# Patient Record
Sex: Female | Born: 1960 | Race: Black or African American | Hispanic: No | Smoking: Current some day smoker
Health system: Southern US, Community
[De-identification: ages and names within clinical notes are randomized; demographics above are authoritative.]

## PROBLEM LIST (undated history)

## (undated) ENCOUNTER — Ambulatory Visit: Admission: EM | Payer: Medicaid Other | Source: Home / Self Care

## (undated) DIAGNOSIS — R7303 Prediabetes: Secondary | ICD-10-CM

## (undated) DIAGNOSIS — F32A Depression, unspecified: Secondary | ICD-10-CM

## (undated) DIAGNOSIS — Z972 Presence of dental prosthetic device (complete) (partial): Secondary | ICD-10-CM

## (undated) DIAGNOSIS — Z8719 Personal history of other diseases of the digestive system: Secondary | ICD-10-CM

## (undated) DIAGNOSIS — F419 Anxiety disorder, unspecified: Secondary | ICD-10-CM

## (undated) DIAGNOSIS — K449 Diaphragmatic hernia without obstruction or gangrene: Secondary | ICD-10-CM

## (undated) DIAGNOSIS — N189 Chronic kidney disease, unspecified: Secondary | ICD-10-CM

## (undated) DIAGNOSIS — I10 Essential (primary) hypertension: Secondary | ICD-10-CM

## (undated) DIAGNOSIS — M2550 Pain in unspecified joint: Secondary | ICD-10-CM

## (undated) DIAGNOSIS — A159 Respiratory tuberculosis unspecified: Secondary | ICD-10-CM

## (undated) DIAGNOSIS — G4733 Obstructive sleep apnea (adult) (pediatric): Secondary | ICD-10-CM

## (undated) DIAGNOSIS — D849 Immunodeficiency, unspecified: Secondary | ICD-10-CM

## (undated) DIAGNOSIS — R0600 Dyspnea, unspecified: Secondary | ICD-10-CM

## (undated) DIAGNOSIS — G4730 Sleep apnea, unspecified: Secondary | ICD-10-CM

## (undated) DIAGNOSIS — R0602 Shortness of breath: Secondary | ICD-10-CM

## (undated) DIAGNOSIS — M1990 Unspecified osteoarthritis, unspecified site: Secondary | ICD-10-CM

## (undated) DIAGNOSIS — E739 Lactose intolerance, unspecified: Secondary | ICD-10-CM

## (undated) DIAGNOSIS — N2581 Secondary hyperparathyroidism of renal origin: Secondary | ICD-10-CM

## (undated) DIAGNOSIS — E7800 Pure hypercholesterolemia, unspecified: Secondary | ICD-10-CM

## (undated) DIAGNOSIS — Q613 Polycystic kidney, unspecified: Secondary | ICD-10-CM

## (undated) DIAGNOSIS — N12 Tubulo-interstitial nephritis, not specified as acute or chronic: Secondary | ICD-10-CM

## (undated) DIAGNOSIS — R12 Heartburn: Secondary | ICD-10-CM

## (undated) DIAGNOSIS — J189 Pneumonia, unspecified organism: Secondary | ICD-10-CM

## (undated) DIAGNOSIS — K420 Umbilical hernia with obstruction, without gangrene: Secondary | ICD-10-CM

## (undated) DIAGNOSIS — D631 Anemia in chronic kidney disease: Secondary | ICD-10-CM

## (undated) DIAGNOSIS — Z9889 Other specified postprocedural states: Secondary | ICD-10-CM

## (undated) DIAGNOSIS — E282 Polycystic ovarian syndrome: Secondary | ICD-10-CM

## (undated) DIAGNOSIS — N8110 Cystocele, unspecified: Secondary | ICD-10-CM

## (undated) DIAGNOSIS — E119 Type 2 diabetes mellitus without complications: Secondary | ICD-10-CM

## (undated) DIAGNOSIS — Z973 Presence of spectacles and contact lenses: Secondary | ICD-10-CM

## (undated) DIAGNOSIS — N186 End stage renal disease: Secondary | ICD-10-CM

## (undated) DIAGNOSIS — W3400XA Accidental discharge from unspecified firearms or gun, initial encounter: Secondary | ICD-10-CM

## (undated) DIAGNOSIS — K2270 Barrett's esophagus without dysplasia: Secondary | ICD-10-CM

## (undated) DIAGNOSIS — K219 Gastro-esophageal reflux disease without esophagitis: Secondary | ICD-10-CM

## (undated) HISTORY — DX: Obstructive sleep apnea (adult) (pediatric): G47.33

## (undated) HISTORY — DX: Lactose intolerance, unspecified: E73.9

## (undated) HISTORY — DX: Pain in unspecified joint: M25.50

## (undated) HISTORY — DX: Barrett's esophagus without dysplasia: K22.70

## (undated) HISTORY — DX: Prediabetes: R73.03

## (undated) HISTORY — DX: Sleep apnea, unspecified: G47.30

## (undated) HISTORY — DX: Anxiety disorder, unspecified: F41.9

## (undated) HISTORY — DX: Pneumonia, unspecified organism: J18.9

## (undated) HISTORY — DX: Shortness of breath: R06.02

## (undated) HISTORY — DX: Cystocele, unspecified: N81.10

## (undated) HISTORY — PX: BREAST BIOPSY: SHX20

## (undated) HISTORY — DX: Heartburn: R12

## (undated) HISTORY — DX: Pure hypercholesterolemia, unspecified: E78.00

## (undated) HISTORY — DX: Umbilical hernia with obstruction, without gangrene: K42.0

## (undated) HISTORY — PX: FOOT SURGERY: SHX648

---
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
## 1996-01-21 DIAGNOSIS — W3400XA Accidental discharge from unspecified firearms or gun, initial encounter: Secondary | ICD-10-CM

## 1996-01-21 DIAGNOSIS — Z87828 Personal history of other (healed) physical injury and trauma: Secondary | ICD-10-CM

## 1996-01-21 HISTORY — DX: Accidental discharge from unspecified firearms or gun, initial encounter: W34.00XA

## 1996-01-21 HISTORY — DX: Personal history of other (healed) physical injury and trauma: Z87.828

## 2001-11-06 ENCOUNTER — Inpatient Hospital Stay (HOSPITAL_COMMUNITY): Admission: AD | Admit: 2001-11-06 | Discharge: 2001-11-06 | Payer: Self-pay | Admitting: *Deleted

## 2001-11-18 ENCOUNTER — Encounter: Admission: RE | Admit: 2001-11-18 | Discharge: 2001-11-18 | Payer: Self-pay | Admitting: Obstetrics and Gynecology

## 2001-12-21 ENCOUNTER — Encounter: Payer: Self-pay | Admitting: Obstetrics and Gynecology

## 2001-12-21 ENCOUNTER — Ambulatory Visit (HOSPITAL_COMMUNITY): Admission: RE | Admit: 2001-12-21 | Discharge: 2001-12-21 | Payer: Self-pay | Admitting: Obstetrics and Gynecology

## 2003-01-17 ENCOUNTER — Emergency Department (HOSPITAL_COMMUNITY): Admission: EM | Admit: 2003-01-17 | Discharge: 2003-01-17 | Payer: Self-pay | Admitting: Emergency Medicine

## 2003-01-20 ENCOUNTER — Emergency Department (HOSPITAL_COMMUNITY): Admission: EM | Admit: 2003-01-20 | Discharge: 2003-01-20 | Payer: Self-pay | Admitting: Emergency Medicine

## 2003-10-17 ENCOUNTER — Emergency Department (HOSPITAL_COMMUNITY): Admission: EM | Admit: 2003-10-17 | Discharge: 2003-10-17 | Payer: Self-pay | Admitting: Emergency Medicine

## 2004-02-24 ENCOUNTER — Emergency Department (HOSPITAL_COMMUNITY): Admission: EM | Admit: 2004-02-24 | Discharge: 2004-02-24 | Payer: Self-pay | Admitting: Emergency Medicine

## 2004-07-09 ENCOUNTER — Inpatient Hospital Stay (HOSPITAL_COMMUNITY): Admission: AD | Admit: 2004-07-09 | Discharge: 2004-07-09 | Payer: Self-pay | Admitting: *Deleted

## 2004-10-13 ENCOUNTER — Emergency Department (HOSPITAL_COMMUNITY): Admission: EM | Admit: 2004-10-13 | Discharge: 2004-10-13 | Payer: Self-pay | Admitting: Emergency Medicine

## 2006-05-02 ENCOUNTER — Emergency Department (HOSPITAL_COMMUNITY): Admission: EM | Admit: 2006-05-02 | Discharge: 2006-05-02 | Payer: Self-pay | Admitting: Emergency Medicine

## 2006-11-12 ENCOUNTER — Emergency Department (HOSPITAL_COMMUNITY): Admission: EM | Admit: 2006-11-12 | Discharge: 2006-11-13 | Payer: Self-pay | Admitting: Emergency Medicine

## 2006-11-16 ENCOUNTER — Inpatient Hospital Stay (HOSPITAL_COMMUNITY): Admission: EM | Admit: 2006-11-16 | Discharge: 2006-11-18 | Payer: Self-pay | Admitting: Emergency Medicine

## 2006-11-24 ENCOUNTER — Ambulatory Visit: Payer: Self-pay | Admitting: *Deleted

## 2007-08-17 ENCOUNTER — Ambulatory Visit: Payer: Self-pay | Admitting: Family Medicine

## 2007-08-20 ENCOUNTER — Ambulatory Visit (HOSPITAL_COMMUNITY): Admission: RE | Admit: 2007-08-20 | Discharge: 2007-08-20 | Payer: Self-pay | Admitting: Family Medicine

## 2007-08-31 ENCOUNTER — Encounter: Admission: RE | Admit: 2007-08-31 | Discharge: 2007-08-31 | Payer: Self-pay | Admitting: Family Medicine

## 2007-12-12 ENCOUNTER — Emergency Department (HOSPITAL_COMMUNITY): Admission: EM | Admit: 2007-12-12 | Discharge: 2007-12-12 | Payer: Self-pay | Admitting: Emergency Medicine

## 2008-04-07 ENCOUNTER — Ambulatory Visit: Payer: Self-pay | Admitting: Internal Medicine

## 2008-04-12 ENCOUNTER — Encounter: Admission: RE | Admit: 2008-04-12 | Discharge: 2008-04-12 | Payer: Self-pay | Admitting: Family Medicine

## 2008-05-02 ENCOUNTER — Encounter: Payer: Self-pay | Admitting: Family Medicine

## 2008-05-02 ENCOUNTER — Ambulatory Visit: Payer: Self-pay | Admitting: Internal Medicine

## 2008-05-02 LAB — CONVERTED CEMR LAB
ALT: 16 units/L (ref 0–35)
AST: 17 units/L (ref 0–37)
Albumin: 4 g/dL (ref 3.5–5.2)
Alkaline Phosphatase: 55 units/L (ref 39–117)
Amphetamine Screen, Ur: NEGATIVE
BUN: 23 mg/dL (ref 6–23)
Barbiturate Quant, Ur: NEGATIVE
Basophils Absolute: 0.1 10*3/uL (ref 0.0–0.1)
Basophils Relative: 1 % (ref 0–1)
Benzodiazepines.: NEGATIVE
CO2: 21 meq/L (ref 19–32)
Calcium: 9.3 mg/dL (ref 8.4–10.5)
Chloride: 108 meq/L (ref 96–112)
Cholesterol: 234 mg/dL — ABNORMAL HIGH (ref 0–200)
Cocaine Metabolites: NEGATIVE
Creatinine, Ser: 1.55 mg/dL — ABNORMAL HIGH (ref 0.40–1.20)
Creatinine,U: 255.4 mg/dL
Eosinophils Absolute: 0.3 10*3/uL (ref 0.0–0.7)
Eosinophils Relative: 5 % (ref 0–5)
Glucose, Bld: 93 mg/dL (ref 70–99)
HCT: 44.1 % (ref 36.0–46.0)
HDL: 52 mg/dL (ref >39)
Hemoglobin: 14.3 g/dL (ref 12.0–15.0)
LDL Cholesterol: 141 mg/dL — ABNORMAL HIGH (ref 0–99)
Lymphocytes Relative: 35 % (ref 12–46)
Lymphs Abs: 2.1 10*3/uL (ref 0.7–4.0)
MCHC: 32.4 g/dL (ref 30.0–36.0)
MCV: 100.5 fL — ABNORMAL HIGH (ref 78.0–100.0)
Marijuana Metabolite: NEGATIVE
Methadone: NEGATIVE
Monocytes Absolute: 0.4 10*3/uL (ref 0.1–1.0)
Monocytes Relative: 7 % (ref 3–12)
Neutro Abs: 3.2 10*3/uL (ref 1.7–7.7)
Neutrophils Relative %: 52 % (ref 43–77)
Opiate Screen, Urine: NEGATIVE
Phencyclidine (PCP): NEGATIVE
Platelets: 340 10*3/uL (ref 150–400)
Potassium: 4.4 meq/L (ref 3.5–5.3)
Propoxyphene: NEGATIVE
RBC: 4.39 M/uL (ref 3.87–5.11)
RDW: 13.9 % (ref 11.5–15.5)
Sodium: 142 meq/L (ref 135–145)
TSH: 0.914 microintl units/mL (ref 0.350–4.500)
Total Bilirubin: 0.2 mg/dL — ABNORMAL LOW (ref 0.3–1.2)
Total CHOL/HDL Ratio: 4.5
Total Protein: 7.9 g/dL (ref 6.0–8.3)
Triglycerides: 203 mg/dL — ABNORMAL HIGH (ref <150)
VLDL: 41 mg/dL — ABNORMAL HIGH (ref 0–40)
WBC: 6.2 10*3/uL (ref 4.0–10.5)

## 2008-05-12 ENCOUNTER — Ambulatory Visit: Payer: Self-pay | Admitting: Internal Medicine

## 2008-05-26 ENCOUNTER — Ambulatory Visit (HOSPITAL_COMMUNITY): Admission: RE | Admit: 2008-05-26 | Discharge: 2008-05-26 | Payer: Self-pay | Admitting: Family Medicine

## 2008-07-14 ENCOUNTER — Ambulatory Visit: Payer: Self-pay | Admitting: Internal Medicine

## 2008-07-19 ENCOUNTER — Ambulatory Visit: Payer: Self-pay | Admitting: Internal Medicine

## 2008-08-28 ENCOUNTER — Ambulatory Visit: Payer: Self-pay | Admitting: Internal Medicine

## 2008-08-28 ENCOUNTER — Encounter: Payer: Self-pay | Admitting: Family Medicine

## 2008-08-28 LAB — CONVERTED CEMR LAB
Chlamydia, DNA Probe: NEGATIVE
GC Probe Amp, Genital: NEGATIVE

## 2008-09-01 ENCOUNTER — Ambulatory Visit (HOSPITAL_COMMUNITY): Admission: RE | Admit: 2008-09-01 | Discharge: 2008-09-01 | Payer: Self-pay | Admitting: Family Medicine

## 2008-09-11 ENCOUNTER — Encounter: Payer: Self-pay | Admitting: Family Medicine

## 2008-09-11 ENCOUNTER — Ambulatory Visit: Payer: Self-pay | Admitting: Internal Medicine

## 2008-09-11 LAB — CONVERTED CEMR LAB
Albumin: 3.8 g/dL (ref 3.5–5.2)
BUN: 25 mg/dL — ABNORMAL HIGH (ref 6–23)
Basophils Absolute: 0 10*3/uL (ref 0.0–0.1)
Basophils Relative: 1 % (ref 0–1)
CO2: 19 meq/L (ref 19–32)
Calcium, Total (PTH): 8.7 mg/dL (ref 8.4–10.5)
Calcium: 8.7 mg/dL (ref 8.4–10.5)
Chloride: 106 meq/L (ref 96–112)
Creatinine, Ser: 1.64 mg/dL — ABNORMAL HIGH (ref 0.40–1.20)
Creatinine, Urine: 154 mg/dL
Eosinophils Absolute: 0.3 10*3/uL (ref 0.0–0.7)
Eosinophils Relative: 4 % (ref 0–5)
Ferritin: 44 ng/mL (ref 10–291)
Glucose, Bld: 92 mg/dL (ref 70–99)
HCT: 36.8 % (ref 36.0–46.0)
Hemoglobin: 12.4 g/dL (ref 12.0–15.0)
Iron: 71 ug/dL (ref 42–145)
Lymphocytes Relative: 31 % (ref 12–46)
Lymphs Abs: 2 10*3/uL (ref 0.7–4.0)
MCHC: 33.7 g/dL (ref 30.0–36.0)
MCV: 96.1 fL (ref 78.0–100.0)
Magnesium: 1.8 mg/dL (ref 1.5–2.5)
Monocytes Absolute: 0.6 10*3/uL (ref 0.1–1.0)
Monocytes Relative: 9 % (ref 3–12)
Neutro Abs: 3.5 10*3/uL (ref 1.7–7.7)
Neutrophils Relative %: 55 % (ref 43–77)
PTH: 35.9 pg/mL (ref 14.0–72.0)
Phosphorus: 3.7 mg/dL (ref 2.3–4.6)
Platelets: 283 10*3/uL (ref 150–400)
Potassium: 4.6 meq/L (ref 3.5–5.3)
RBC: 3.83 M/uL — ABNORMAL LOW (ref 3.87–5.11)
RDW: 13.8 % (ref 11.5–15.5)
Saturation Ratios: 23 % (ref 20–55)
Sodium: 138 meq/L (ref 135–145)
TIBC: 307 ug/dL (ref 250–470)
Total Protein, Urine: 9
UIBC: 236 ug/dL
Vit D, 25-Hydroxy: 22 ng/mL — ABNORMAL LOW (ref 30–89)
WBC: 6.3 10*3/uL (ref 4.0–10.5)

## 2008-10-30 ENCOUNTER — Ambulatory Visit: Payer: Self-pay | Admitting: Internal Medicine

## 2008-12-25 ENCOUNTER — Encounter (INDEPENDENT_AMBULATORY_CARE_PROVIDER_SITE_OTHER): Payer: Self-pay | Admitting: Adult Health

## 2008-12-25 ENCOUNTER — Ambulatory Visit: Payer: Self-pay | Admitting: Internal Medicine

## 2008-12-25 LAB — CONVERTED CEMR LAB
Chlamydia, Swab/Urine, PCR: NEGATIVE
GC Probe Amp, Urine: NEGATIVE

## 2009-02-23 ENCOUNTER — Encounter: Admission: RE | Admit: 2009-02-23 | Discharge: 2009-02-23 | Payer: Self-pay | Admitting: Internal Medicine

## 2009-03-23 ENCOUNTER — Ambulatory Visit: Payer: Self-pay | Admitting: Internal Medicine

## 2009-03-30 ENCOUNTER — Ambulatory Visit: Payer: Self-pay | Admitting: Internal Medicine

## 2009-03-30 LAB — CONVERTED CEMR LAB
ALT: 11 units/L (ref 0–35)
AST: 14 units/L (ref 0–37)
Albumin: 4 g/dL (ref 3.5–5.2)
Alkaline Phosphatase: 40 units/L (ref 39–117)
BUN: 23 mg/dL (ref 6–23)
Basophils Absolute: 0 10*3/uL (ref 0.0–0.1)
Basophils Relative: 1 % (ref 0–1)
CO2: 18 meq/L — ABNORMAL LOW (ref 19–32)
Calcium: 8.8 mg/dL (ref 8.4–10.5)
Chloride: 106 meq/L (ref 96–112)
Cholesterol: 224 mg/dL — ABNORMAL HIGH (ref 0–200)
Creatinine, Ser: 1.29 mg/dL — ABNORMAL HIGH (ref 0.40–1.20)
Eosinophils Absolute: 0.2 10*3/uL (ref 0.0–0.7)
Eosinophils Relative: 3 % (ref 0–5)
Glucose, Bld: 104 mg/dL — ABNORMAL HIGH (ref 70–99)
HCT: 41.8 % (ref 36.0–46.0)
HDL: 46 mg/dL (ref >39)
Hemoglobin: 13.8 g/dL (ref 12.0–15.0)
LDL Cholesterol: 156 mg/dL — ABNORMAL HIGH (ref 0–99)
Lymphocytes Relative: 35 % (ref 12–46)
Lymphs Abs: 1.9 10*3/uL (ref 0.7–4.0)
MCHC: 33 g/dL (ref 30.0–36.0)
MCV: 97.7 fL (ref 78.0–100.0)
Monocytes Absolute: 0.4 10*3/uL (ref 0.1–1.0)
Monocytes Relative: 6 % (ref 3–12)
Neutro Abs: 3 10*3/uL (ref 1.7–7.7)
Neutrophils Relative %: 55 % (ref 43–77)
Platelets: 304 10*3/uL (ref 150–400)
Potassium: 4.3 meq/L (ref 3.5–5.3)
RBC: 4.28 M/uL (ref 3.87–5.11)
RDW: 13.8 % (ref 11.5–15.5)
Sodium: 141 meq/L (ref 135–145)
Total Bilirubin: 0.3 mg/dL (ref 0.3–1.2)
Total CHOL/HDL Ratio: 4.9
Total Protein: 7.3 g/dL (ref 6.0–8.3)
Triglycerides: 108 mg/dL (ref <150)
VLDL: 22 mg/dL (ref 0–40)
WBC: 5.5 10*3/uL (ref 4.0–10.5)

## 2009-04-03 ENCOUNTER — Encounter: Admission: RE | Admit: 2009-04-03 | Discharge: 2009-04-03 | Payer: Self-pay | Admitting: Family Medicine

## 2009-04-18 ENCOUNTER — Ambulatory Visit: Payer: Self-pay | Admitting: Family Medicine

## 2009-04-26 ENCOUNTER — Ambulatory Visit: Payer: Self-pay | Admitting: Internal Medicine

## 2009-05-03 ENCOUNTER — Ambulatory Visit: Payer: Self-pay | Admitting: Internal Medicine

## 2009-05-17 ENCOUNTER — Ambulatory Visit: Payer: Self-pay | Admitting: Internal Medicine

## 2009-05-24 ENCOUNTER — Ambulatory Visit: Payer: Self-pay | Admitting: Internal Medicine

## 2009-06-11 ENCOUNTER — Ambulatory Visit: Payer: Self-pay | Admitting: Internal Medicine

## 2009-06-25 ENCOUNTER — Ambulatory Visit: Payer: Self-pay | Admitting: Internal Medicine

## 2009-06-25 ENCOUNTER — Encounter (INDEPENDENT_AMBULATORY_CARE_PROVIDER_SITE_OTHER): Payer: Self-pay | Admitting: Adult Health

## 2009-06-25 LAB — CONVERTED CEMR LAB
ALT: 11 units/L (ref 0–35)
AST: 13 units/L (ref 0–37)
Albumin: 4 g/dL (ref 3.5–5.2)
Alkaline Phosphatase: 48 units/L (ref 39–117)
BUN: 25 mg/dL — ABNORMAL HIGH (ref 6–23)
Basophils Absolute: 0 10*3/uL (ref 0.0–0.1)
Basophils Relative: 1 % (ref 0–1)
CO2: 18 meq/L — ABNORMAL LOW (ref 19–32)
Calcium: 9.5 mg/dL (ref 8.4–10.5)
Chloride: 108 meq/L (ref 96–112)
Cholesterol: 218 mg/dL — ABNORMAL HIGH (ref 0–200)
Creatinine, Ser: 1.39 mg/dL — ABNORMAL HIGH (ref 0.40–1.20)
Eosinophils Absolute: 0.6 10*3/uL (ref 0.0–0.7)
Eosinophils Relative: 7 % — ABNORMAL HIGH (ref 0–5)
Glucose, Bld: 98 mg/dL (ref 70–99)
HCT: 43.4 % (ref 36.0–46.0)
HDL: 53 mg/dL (ref >39)
Hemoglobin: 14.1 g/dL (ref 12.0–15.0)
INR: 1.09 (ref <1.50)
LDL Cholesterol: 143 mg/dL — ABNORMAL HIGH (ref 0–99)
Lymphocytes Relative: 28 % (ref 12–46)
Lymphs Abs: 2.3 10*3/uL (ref 0.7–4.0)
MCHC: 32.5 g/dL (ref 30.0–36.0)
MCV: 98 fL (ref 78.0–100.0)
Microalb, Ur: 10.65 mg/dL — ABNORMAL HIGH (ref 0.00–1.89)
Monocytes Absolute: 0.4 10*3/uL (ref 0.1–1.0)
Monocytes Relative: 5 % (ref 3–12)
Neutro Abs: 4.9 10*3/uL (ref 1.7–7.7)
Neutrophils Relative %: 59 % (ref 43–77)
Platelets: 278 10*3/uL (ref 150–400)
Potassium: 4.7 meq/L (ref 3.5–5.3)
Prothrombin Time: 14 s (ref 11.6–15.2)
RBC: 4.43 M/uL (ref 3.87–5.11)
RDW: 14.6 % (ref 11.5–15.5)
Sodium: 138 meq/L (ref 135–145)
TSH: 1.056 microintl units/mL (ref 0.350–4.500)
Total Bilirubin: 0.4 mg/dL (ref 0.3–1.2)
Total CHOL/HDL Ratio: 4.1
Total Protein: 7.7 g/dL (ref 6.0–8.3)
Triglycerides: 109 mg/dL (ref <150)
VLDL: 22 mg/dL (ref 0–40)
Vit D, 25-Hydroxy: 33 ng/mL (ref 30–89)
WBC: 8.2 10*3/uL (ref 4.0–10.5)

## 2009-06-26 ENCOUNTER — Ambulatory Visit: Payer: Self-pay | Admitting: Internal Medicine

## 2009-07-10 ENCOUNTER — Ambulatory Visit: Payer: Self-pay | Admitting: Internal Medicine

## 2009-07-19 ENCOUNTER — Ambulatory Visit: Payer: Self-pay | Admitting: Obstetrics and Gynecology

## 2009-07-19 ENCOUNTER — Other Ambulatory Visit: Admission: RE | Admit: 2009-07-19 | Discharge: 2009-07-19 | Payer: Self-pay | Admitting: Obstetrics and Gynecology

## 2009-07-19 LAB — CONVERTED CEMR LAB
HCT: 40.5 % (ref 36.0–46.0)
Hemoglobin: 13.8 g/dL (ref 12.0–15.0)
INR: 1.24 (ref <1.50)
MCHC: 34.1 g/dL (ref 30.0–36.0)
MCV: 98.5 fL (ref 78.0–100.0)
Platelets: 270 10*3/uL (ref 150–400)
Prothrombin Time: 15.5 s — ABNORMAL HIGH (ref 11.6–15.2)
RBC: 4.11 M/uL (ref 3.87–5.11)
RDW: 14.2 % (ref 11.5–15.5)
WBC: 8.1 10*3/uL (ref 4.0–10.5)
aPTT: 29 s (ref 24–37)

## 2009-08-09 ENCOUNTER — Ambulatory Visit: Payer: Self-pay | Admitting: Obstetrics and Gynecology

## 2009-08-16 ENCOUNTER — Other Ambulatory Visit: Admission: RE | Admit: 2009-08-16 | Discharge: 2009-08-16 | Payer: Self-pay | Admitting: Obstetrics and Gynecology

## 2009-08-16 ENCOUNTER — Ambulatory Visit: Payer: Self-pay | Admitting: Obstetrics and Gynecology

## 2009-08-30 ENCOUNTER — Ambulatory Visit: Payer: Self-pay | Admitting: Obstetrics & Gynecology

## 2009-09-12 ENCOUNTER — Ambulatory Visit: Payer: Self-pay | Admitting: Internal Medicine

## 2010-01-20 HISTORY — PX: TOTAL VAGINAL HYSTERECTOMY: SHX2548

## 2010-01-31 ENCOUNTER — Ambulatory Visit: Admit: 2010-01-31 | Payer: Self-pay | Admitting: Obstetrics & Gynecology

## 2010-02-14 ENCOUNTER — Ambulatory Visit: Admission: RE | Admit: 2010-02-14 | Payer: Self-pay | Source: Home / Self Care | Admitting: Obstetrics and Gynecology

## 2010-03-01 ENCOUNTER — Other Ambulatory Visit: Payer: Self-pay | Admitting: Obstetrics & Gynecology

## 2010-03-01 ENCOUNTER — Ambulatory Visit: Payer: Self-pay | Admitting: Obstetrics & Gynecology

## 2010-03-01 DIAGNOSIS — R87610 Atypical squamous cells of undetermined significance on cytologic smear of cervix (ASC-US): Secondary | ICD-10-CM

## 2010-03-01 DIAGNOSIS — R102 Pelvic and perineal pain: Secondary | ICD-10-CM

## 2010-03-01 DIAGNOSIS — N949 Unspecified condition associated with female genital organs and menstrual cycle: Secondary | ICD-10-CM

## 2010-03-07 ENCOUNTER — Ambulatory Visit: Payer: Self-pay | Admitting: Obstetrics & Gynecology

## 2010-03-08 ENCOUNTER — Ambulatory Visit (HOSPITAL_COMMUNITY)
Admission: RE | Admit: 2010-03-08 | Discharge: 2010-03-08 | Disposition: A | Discharge status: Home / Self Care | Payer: Self-pay | Source: Ambulatory Visit | Attending: Obstetrics & Gynecology | Admitting: Obstetrics & Gynecology

## 2010-03-08 DIAGNOSIS — D251 Intramural leiomyoma of uterus: Secondary | ICD-10-CM | POA: Insufficient documentation

## 2010-03-08 DIAGNOSIS — R102 Pelvic and perineal pain: Secondary | ICD-10-CM

## 2010-03-08 DIAGNOSIS — D252 Subserosal leiomyoma of uterus: Secondary | ICD-10-CM | POA: Insufficient documentation

## 2010-03-08 DIAGNOSIS — N949 Unspecified condition associated with female genital organs and menstrual cycle: Secondary | ICD-10-CM | POA: Insufficient documentation

## 2010-04-06 LAB — POCT PREGNANCY, URINE: Preg Test, Ur: NEGATIVE

## 2010-05-05 ENCOUNTER — Emergency Department (HOSPITAL_COMMUNITY)
Admission: EM | Admit: 2010-05-05 | Discharge: 2010-05-05 | Disposition: A | Discharge status: Home / Self Care | Payer: Self-pay | Attending: Emergency Medicine | Admitting: Emergency Medicine

## 2010-05-05 ENCOUNTER — Emergency Department (HOSPITAL_COMMUNITY): Payer: Self-pay

## 2010-05-05 DIAGNOSIS — X500XXA Overexertion from strenuous movement or load, initial encounter: Secondary | ICD-10-CM | POA: Insufficient documentation

## 2010-05-05 DIAGNOSIS — M25569 Pain in unspecified knee: Secondary | ICD-10-CM | POA: Insufficient documentation

## 2010-05-05 DIAGNOSIS — I10 Essential (primary) hypertension: Secondary | ICD-10-CM | POA: Insufficient documentation

## 2010-05-05 DIAGNOSIS — E7800 Pure hypercholesterolemia, unspecified: Secondary | ICD-10-CM | POA: Insufficient documentation

## 2010-05-05 DIAGNOSIS — IMO0002 Reserved for concepts with insufficient information to code with codable children: Secondary | ICD-10-CM | POA: Insufficient documentation

## 2010-06-04 NOTE — Discharge Summary (Signed)
[name], Kristen Moreno            ACCOUNT NO.:  [id number]   MEDICAL RECORD NO.:  [id number]          PATIENT TYPE:  INP   LOCATION:  [id number]                         FACILITY:  [facility]   PHYSICIAN:  Aquilla Hacker, M.D. DATE OF BIRTH:  Aug 10, 1960   DATE OF ADMISSION:  11/16/2006  DATE OF DISCHARGE:  11/18/2006                               DISCHARGE SUMMARY   PRIMARY CARE PHYSICIAN:  Unassigned.   FINAL DIAGNOSES:  1. Pyelonephritis.  2. Nausea and vomiting.  3. Mild renal insufficiency.   HISTORY OF PRESENT ILLNESS:  Kristen Moreno is a 50 year old female who  arrived for the complaint of left-sided flank pain accompanied by fevers  and chills. She indicated that she had experienced her symptoms over the  course of 1 week. She had been started on oral Cipro however because of  the nausea and vomiting she was not able to keep the medication down,  therefore she came to the hospital for further evaluation.   PAST MEDICAL HISTORY:  Please see that dictated by Dr. Jana Hakim.   HOSPITAL COURSE:  1. Pyelonephritis. The patient was started on IV ciprofloxacin. The      urinalysis revealed large leukocytes and too numerous to count WBC.      The patient complained of flank pain throughout the earlier portion      of her hospital stay and for this she was provided with p.r.n. pain      medication. Urine culture completed October 24 had previously      revealed E. coli present in the patient's urine. A repeat urine      culture revealed no growth. Her flank pain resolved as did her      fevers, and by the date of discharge the patient indicated that she      felt better.  2. Nausea and vomiting. The patient received p.r.n. antiemetics, and      over the past 24-48 hours, the patient has not complained of      emesis.  3. Mild renal insufficiency. When the patient arrived her BUN was      noted to be 16, her creatinine 1.35. She was aggressively hydrated      and by the date of  discharge this condition had improved. On the      date of discharge, the patient indicated that she feels better. She      requested to be discharged from the hospital. Her temperature was      98.2, heart rate 82, respirations 20, blood pressure 155/91. O2 sat      is 99% on room air. The decision has been made to discharge the      patient from the hospital.   She will be discharged home on:  1. Ciprofloxacin 250 mg p.o. q.12 h.  2. Oxycodone 5 mg p.o. q.8 h p.r.n.      Aquilla Hacker, M.D.  Electronically Signed    OR/MEDQ  D:  11/18/2006  T:  11/18/2006  Job:  [id number]

## 2010-06-04 NOTE — H&P (Signed)
Kristen Moreno, Kristen Moreno            ACCOUNT NO.:  [id number]   MEDICAL RECORD NO.:  [id number]          PATIENT TYPE:  INP   LOCATION:  [id number]                         FACILITY:  [facility]   PHYSICIAN:  Jana Hakim, M.D. DATE OF BIRTH:  March 08, 1960   DATE OF ADMISSION:  11/16/2006  DATE OF DISCHARGE:                              HISTORY & PHYSICAL   PRIMARY CARE PHYSICIAN:  Unassigned.   CHIEF COMPLAINT:  Left-sided flank pain and fever.   HISTORY OF PRESENT ILLNESS:  This is a 50 year old female who returns to  the emergency department, secondary to increased left-sided flank pain,  fevers and chills, that she reports having for over 1 week.  She reports  being seen in the emergency department and being evaluated.  Urine  cultures were sent at the time, and a urinalysis as well.  The patient  was placed on antibiotic therapy of Cipro, and this was on November 13, 2006.  The patient, however, began to develop nausea and vomiting, could  not hold down her antibiotic or foods or liquids and returned to the  emergency department.   Results of her urine culture did reveal E-coli which were pan-  susceptible, and ciprofloxacin is one of the medications that the  organism is susceptible to.   PAST MEDICAL HISTORY:  Significant for polycystic kidney disease  diagnosed in 1989.   PAST SURGICAL HISTORY:  None.   MEDICATIONS:  None other than the outpatient ciprofloxacin, which had  been prescribed.   ALLERGIES:  NO KNOWN DRUG ALLERGIES.   SOCIAL HISTORY:  The patient is a smoker, smokes one pack per day and  drinks alcohol occasionally, reports drinking one mixed drink per week.   FAMILY HISTORY:  Positive for polycystic kidney disease in her maternal  grandmother, her mother and her maternal aunt who are all deceased,  positive for hypertension in her mother and maternal grandmother.  No  diabetes mellitus in her family and positive for cancer in her maternal  grandmother and  maternal aunt.   REVIEW OF SYSTEMS:  The patient denies having any diarrhea, does report  having suprapubic abdominal pain.  Positive fevers/chills,  nausea/vomiting.  No syncope, chest pain or shortness of breath.   PHYSICAL EXAMINATION FINDINGS:  GENERAL:  This is a 50 year old mildly  overweight female in discomfort but no acute distress.  VITAL SIGNS:  Her vital signs are temperature 99.1, blood pressure  141/90, heart rate 102 to 114, respirations 18-20, O2 saturations 98% to  99% on room air.  HEENT:  Examination normocephalic, atraumatic.  Pupils equally round,  reactive to light.  Extraocular muscles are intact.  Funduscopic benign.  Oropharynx is clear.  NECK:  Supple, full range of motion.  No thyromegaly, adenopathy or  jugular venous distention.  CARDIOVASCULAR:  Mild tachycardia.  No murmurs, gallops or rubs.  LUNGS:  Clear to auscultation bilaterally.  ABDOMEN:  Positive bowel sounds, soft, nontender, nondistended.  EXTREMITIES:  Without cyanosis, clubbing or edema.  NEUROLOGIC:  Examination alert and oriented x3.  There are no focal  deficits.   LABORATORY STUDIES:  White blood cell count 8.4, hemoglobin  12.0,  hematocrit 35.1, platelets 333, neutrophils 73%, lymphocytes 15%, sodium  137, potassium 3.6, chloride 105, bicarb 21, BUN 16, creatinine 1.35,  and glucose 109.  Urinalysis/urine culture performed on October 24  revealed positive E-coli, pan susceptible.   ASSESSMENT:  A 50 year old female being admitted with:  1. Left-sided pyelonephritis.  2. Nausea and vomiting.  3. Polycystic kidney disease.  4. Tobacco abuse.   PLAN:  The patient will be admitted and placed on IV antibiotic therapy  of Cipro.  She will continue on IV fluids for rehydration and  maintenance therapy.  Antibiotics have been ordered as needed, along  with pain control therapy as needed.  The patient has been counseled  regarding her tobacco usage and has been advised about smoking   cessation.  A nicotine patch has been ordered, while she is  hospitalized.  DVT and GI prophylaxis have also been ordered.      Jana Hakim, M.D.  Electronically Signed     HJ/MEDQ  D:  11/16/2006  T:  11/17/2006  Job:  [id number]

## 2010-08-27 ENCOUNTER — Emergency Department (HOSPITAL_COMMUNITY)
Admission: EM | Admit: 2010-08-27 | Discharge: 2010-08-27 | Disposition: A | Discharge status: Home / Self Care | Payer: Self-pay | Attending: Emergency Medicine | Admitting: Emergency Medicine

## 2010-08-27 DIAGNOSIS — X58XXXA Exposure to other specified factors, initial encounter: Secondary | ICD-10-CM | POA: Insufficient documentation

## 2010-08-27 DIAGNOSIS — I10 Essential (primary) hypertension: Secondary | ICD-10-CM | POA: Insufficient documentation

## 2010-08-27 DIAGNOSIS — N390 Urinary tract infection, site not specified: Secondary | ICD-10-CM | POA: Insufficient documentation

## 2010-08-27 DIAGNOSIS — S335XXA Sprain of ligaments of lumbar spine, initial encounter: Secondary | ICD-10-CM | POA: Insufficient documentation

## 2010-08-27 LAB — URINALYSIS, ROUTINE W REFLEX MICROSCOPIC
Glucose, UA: NEGATIVE mg/dL
Hgb urine dipstick: NEGATIVE
Ketones, ur: NEGATIVE mg/dL
Nitrite: NEGATIVE
Protein, ur: 100 mg/dL — AB
Specific Gravity, Urine: 1.019 (ref 1.005–1.030)
Urobilinogen, UA: 0.2 mg/dL (ref 0.0–1.0)
pH: 5.5 (ref 5.0–8.0)

## 2010-08-27 LAB — URINE MICROSCOPIC-ADD ON

## 2010-10-30 LAB — URINALYSIS, ROUTINE W REFLEX MICROSCOPIC
Bilirubin Urine: NEGATIVE
Bilirubin Urine: NEGATIVE
Glucose, UA: NEGATIVE
Glucose, UA: NEGATIVE
Ketones, ur: 15 — AB
Ketones, ur: NEGATIVE
Nitrite: NEGATIVE
Nitrite: NEGATIVE
Protein, ur: 100 — AB
Protein, ur: 100 — AB
Specific Gravity, Urine: 1.016
Specific Gravity, Urine: 1.017
Urobilinogen, UA: 0.2
Urobilinogen, UA: 1
pH: 6
pH: 6.5

## 2010-10-30 LAB — CBC
HCT: 31.9 — ABNORMAL LOW
HCT: 35.1 — ABNORMAL LOW
Hemoglobin: 11.1 — ABNORMAL LOW
Hemoglobin: 12
MCHC: 34.3
MCHC: 34.7
MCV: 93.4
MCV: 93.5
Platelets: 287
Platelets: 333
RBC: 3.41 — ABNORMAL LOW
RBC: 3.76 — ABNORMAL LOW
RDW: 13.7
RDW: 13.9
WBC: 6
WBC: 8.4

## 2010-10-30 LAB — BASIC METABOLIC PANEL
BUN: 15
BUN: 16
BUN: 8
CO2: 21
CO2: 21
CO2: 22
Calcium: 7.7 — ABNORMAL LOW
Calcium: 8.5
Calcium: 8.7
Chloride: 103
Chloride: 105
Chloride: 111
Creatinine, Ser: 1.14
Creatinine, Ser: 1.35 — ABNORMAL HIGH
Creatinine, Ser: 1.55 — ABNORMAL HIGH
GFR calc Af Amer: 44 — ABNORMAL LOW
GFR calc Af Amer: 52 — ABNORMAL LOW
GFR calc Af Amer: >60
GFR calc non Af Amer: 36 — ABNORMAL LOW
GFR calc non Af Amer: 43 — ABNORMAL LOW
GFR calc non Af Amer: 52 — ABNORMAL LOW
Glucose, Bld: 109 — ABNORMAL HIGH
Glucose, Bld: 119 — ABNORMAL HIGH
Glucose, Bld: 119 — ABNORMAL HIGH
Potassium: 3.5
Potassium: 3.6
Potassium: 3.6
Sodium: 134 — ABNORMAL LOW
Sodium: 137
Sodium: 138

## 2010-10-30 LAB — URINE MICROSCOPIC-ADD ON

## 2010-10-30 LAB — URINE CULTURE
Colony Count: >=100000
Colony Count: NO GROWTH
Culture: NO GROWTH

## 2010-10-30 LAB — DIFFERENTIAL
Basophils Absolute: 0
Basophils Relative: 0
Eosinophils Absolute: 0.3
Eosinophils Relative: 3
Lymphocytes Relative: 15
Lymphs Abs: 1.3
Monocytes Absolute: 0.8 — ABNORMAL HIGH
Monocytes Relative: 9
Neutro Abs: 6.1
Neutrophils Relative %: 73

## 2010-10-30 LAB — HEPATIC FUNCTION PANEL
ALT: 13
AST: 14
Albumin: 3 — ABNORMAL LOW
Alkaline Phosphatase: 66
Bilirubin, Direct: <0.1
Total Bilirubin: 0.5
Total Protein: 7.6

## 2010-10-30 LAB — POCT PREGNANCY, URINE
Operator id: 173521
Preg Test, Ur: NEGATIVE

## 2010-12-09 ENCOUNTER — Encounter: Payer: Self-pay | Admitting: *Deleted

## 2010-12-09 ENCOUNTER — Emergency Department (HOSPITAL_COMMUNITY)
Admission: EM | Admit: 2010-12-09 | Discharge: 2010-12-09 | Disposition: A | Discharge status: Home / Self Care | Payer: Self-pay | Attending: Emergency Medicine | Admitting: Emergency Medicine

## 2010-12-09 DIAGNOSIS — R599 Enlarged lymph nodes, unspecified: Secondary | ICD-10-CM | POA: Insufficient documentation

## 2010-12-09 DIAGNOSIS — M542 Cervicalgia: Secondary | ICD-10-CM | POA: Insufficient documentation

## 2010-12-09 DIAGNOSIS — H9209 Otalgia, unspecified ear: Secondary | ICD-10-CM | POA: Insufficient documentation

## 2010-12-09 DIAGNOSIS — H109 Unspecified conjunctivitis: Secondary | ICD-10-CM

## 2010-12-09 DIAGNOSIS — R591 Generalized enlarged lymph nodes: Secondary | ICD-10-CM

## 2010-12-09 HISTORY — DX: Essential (primary) hypertension: I10

## 2010-12-09 MED ORDER — OLOPATADINE HCL 0.1 % OP SOLN
1.0000 [drp] | Freq: Two times a day (BID) | OPHTHALMIC | Status: DC
  Administered 2010-12-09: 1 [drp] via OPHTHALMIC
  Filled 2010-12-09: qty 5

## 2010-12-09 MED ORDER — PSEUDOEPHEDRINE HCL 30 MG PO TABS: 30.0000 mg | ORAL_TABLET | ORAL | Status: AC | PRN

## 2010-12-09 MED ORDER — ERYTHROMYCIN 5 MG/GM OP OINT
TOPICAL_OINTMENT | Freq: Once | OPHTHALMIC | Status: AC
  Administered 2010-12-09: 18:00:00 via OPHTHALMIC
  Filled 2010-12-09: qty 3.5

## 2010-12-09 NOTE — ED Provider Notes (Signed)
History     CSN: [id number] Arrival date & time: 12/09/2010  1:48 PM   First MD Initiated Contact with Patient 12/09/10 1619      Chief Complaint  Patient presents with  . Eye Drainage    (Consider location/radiation/quality/duration/timing/severity/associated sxs/prior treatment) Patient is a 50 y.o. female presenting with eye problem.  Eye Problem  This is a new problem. The current episode started 12 to 24 hours ago. The problem occurs constantly. The problem has been gradually worsening. There is pain in both eyes. The injury mechanism is unknown. The pain is at a severity of 0/10. The patient is experiencing no pain. There is no history of trauma to the eye. There is no known exposure to pink eye. She does not wear contacts. Associated symptoms include discharge, eye redness and itching. Pertinent negatives include no numbness, no blurred vision, no decreased vision, no double vision, no foreign body sensation, no photophobia, no nausea, no vomiting, no tingling and no weakness. She has tried nothing for the symptoms.  Pt reports having a URI for last week, stats gettting better. Last night redness and drainage in left eye, today redness and drainage, itching in both eyes. Right ear pain as well, feels like swelling and tenderness around the ear. Denis fever, chills, eye injury. No hisotry of the same.  Past Medical History  Diagnosis Date  . Hypertension     History reviewed. No pertinent past surgical history.  Family History  Problem Relation Age of Onset  . Asthma Mother   . Hypertension Mother   . Asthma Father   . Hypertension Father     History  Substance Use Topics  . Smoking status: Current Everyday Smoker  . Smokeless tobacco: Current User  . Alcohol Use: No    OB History    Grav Para Term Preterm Abortions TAB SAB Ect Mult Living                  Review of Systems  Constitutional: Negative.  Negative for fever and chills.  HENT: Positive for  rhinorrhea and neck pain. Negative for sore throat and neck stiffness.   Eyes: Positive for discharge, redness and itching. Negative for blurred vision, double vision, photophobia, pain and visual disturbance.  Respiratory: Negative.   Cardiovascular: Negative.   Gastrointestinal: Negative.  Negative for nausea and vomiting.  Genitourinary: Negative.   Skin: Positive for itching.  Neurological: Negative for dizziness, tingling, facial asymmetry, weakness, numbness and headaches.  Hematological: Negative.   Psychiatric/Behavioral: Negative.     Allergies  Review of patient's allergies indicates no known allergies.  Home Medications  No current outpatient prescriptions on file.  BP 155/93  Pulse 73  Temp(Src) 98.3 F (36.8 C) (Oral)  Resp 18  SpO2 100%  Physical Exam  Nursing note and vitals reviewed. Constitutional: She is oriented to person, place, and time. She appears well-developed and well-nourished. No distress.  HENT:  Head: Normocephalic and atraumatic.       Normal right and left TM. Tender preuricular lymphadenopathy to the right ear.  Eyes:       Normal eye lids. Bilateral conjuncival erythema, purulent drainage. PERRL. No photophobia. No hyphema, no hypopyon  Neck: Normal range of motion.  Cardiovascular: Normal rate, regular rhythm and normal heart sounds.   Pulmonary/Chest: Effort normal and breath sounds normal.  Musculoskeletal: Normal range of motion.  Lymphadenopathy:    She has no cervical adenopathy.  Neurological: She is alert and oriented to person, place, and time.  She has normal reflexes. No cranial nerve deficit.  Skin: Skin is warm and dry. No rash noted. No erythema.  Psychiatric: She has a normal mood and affect.    ED Course  Procedures (including critical care time) Pt with right ear pain, bilateral conjunctival injection, drainage. No eye pain, no photophobia, no injuries. Will treat with decongestans, erythromycin eye drops, patanol for  itching. Suspect conjunctivitis.  Pt given patanol drops, and erythromycin ointment topically. Vision unchanged. Will d/c home.   [place], [place] 12/10/10 [phone]

## 2010-12-09 NOTE — ED Notes (Signed)
Pt states yesterday she started have drainage out of her left eye. Pt states today she was eating an apple and felt the left side of her face swelling

## 2010-12-10 NOTE — ED Provider Notes (Signed)
Medical screening examination/treatment/procedure(s) were performed by non-physician practitioner and as supervising physician I was immediately available for consultation/collaboration.   Hoy Morn, MD 12/10/10 1230

## 2011-01-21 HISTORY — PX: ABDOMINAL HYSTERECTOMY: SHX81

## 2011-03-05 ENCOUNTER — Encounter (HOSPITAL_COMMUNITY): Payer: Self-pay | Admitting: Emergency Medicine

## 2011-03-05 ENCOUNTER — Emergency Department (HOSPITAL_COMMUNITY)
Admission: EM | Admit: 2011-03-05 | Discharge: 2011-03-05 | Disposition: A | Discharge status: Home / Self Care | Payer: Self-pay | Attending: Emergency Medicine | Admitting: Emergency Medicine

## 2011-03-05 DIAGNOSIS — F17200 Nicotine dependence, unspecified, uncomplicated: Secondary | ICD-10-CM | POA: Insufficient documentation

## 2011-03-05 DIAGNOSIS — R519 Headache, unspecified: Secondary | ICD-10-CM

## 2011-03-05 DIAGNOSIS — H53149 Visual discomfort, unspecified: Secondary | ICD-10-CM | POA: Insufficient documentation

## 2011-03-05 DIAGNOSIS — R42 Dizziness and giddiness: Secondary | ICD-10-CM | POA: Insufficient documentation

## 2011-03-05 DIAGNOSIS — R51 Headache: Secondary | ICD-10-CM | POA: Insufficient documentation

## 2011-03-05 DIAGNOSIS — I10 Essential (primary) hypertension: Secondary | ICD-10-CM | POA: Insufficient documentation

## 2011-03-05 MED ORDER — PROMETHAZINE HCL 25 MG/ML IJ SOLN
25.0000 mg | Freq: Four times a day (QID) | INTRAMUSCULAR | Status: DC | PRN
  Administered 2011-03-05: 25 mg via INTRAMUSCULAR
  Filled 2011-03-05: qty 1

## 2011-03-05 MED ORDER — HYDROCHLOROTHIAZIDE 12.5 MG PO CAPS: 12.5000 mg | ORAL_CAPSULE | Freq: Every day | ORAL | Status: DC

## 2011-03-05 MED ORDER — METOCLOPRAMIDE HCL 5 MG/ML IJ SOLN
10.0000 mg | Freq: Once | INTRAMUSCULAR | Status: AC
  Administered 2011-03-05: 10 mg via INTRAMUSCULAR
  Filled 2011-03-05: qty 2

## 2011-03-05 NOTE — Discharge Instructions (Signed)
Take your medication as prescribed.  Call health serve as above to help he find a primary care doctor to followup in regards to your high blood pressure.  The instructions below to learn more on causes of high blood pressure and waist reduce your salt intake.  Arterial Hypertension Arterial hypertension (high blood pressure) is a condition of elevated pressure in your blood vessels. Hypertension over a long period of time is a risk factor for strokes, heart attacks, and heart failure. It is also the leading cause of kidney (renal) failure.  CAUSES   In Adults -- Over 90% of all hypertension has no known cause. This is called essential or primary hypertension. In the other 10% of people with hypertension, the increase in blood pressure is caused by another disorder. This is called secondary hypertension. Important causes of secondary hypertension are:   Heavy alcohol use.   Obstructive sleep apnea.   Hyperaldosterosim (Conn's syndrome).   Steroid use.   Chronic kidney failure.   Hyperparathyroidism.   Medications.   Renal artery stenosis.   Pheochromocytoma.   Cushing's disease.   Coarctation of the aorta.   Scleroderma renal crisis.   Licorice (in excessive amounts).   Drugs (cocaine, methamphetamine).  Your caregiver can explain any items above that apply to you.  In Children -- Secondary hypertension is more common and should always be considered.   Pregnancy -- Few women of childbearing age have high blood pressure. However, up to 10% of them develop hypertension of pregnancy. Generally, this will not harm the woman. It may be a sign of 3 complications of pregnancy: preeclampsia, HELLP syndrome, and eclampsia. Follow up and control with medication is necessary.  SYMPTOMS   This condition normally does not produce any noticeable symptoms. It is usually found during a routine exam.   Malignant hypertension is a late problem of high blood pressure. It may have the  following symptoms:   Headaches.   Blurred vision.   End-organ damage (this means your kidneys, heart, lungs, and other organs are being damaged).   Stressful situations can increase the blood pressure. If a person with normal blood pressure has their blood pressure go up while being seen by their caregiver, this is often termed "white coat hypertension." Its importance is not known. It may be related with eventually developing hypertension or complications of hypertension.   Hypertension is often confused with mental tension, stress, and anxiety.  DIAGNOSIS  The diagnosis is made by 3 separate blood pressure measurements. They are taken at least 1 week apart from each other. If there is organ damage from hypertension, the diagnosis may be made without repeat measurements. Hypertension is usually identified by having blood pressure readings:  Above 140/90 mmHg measured in both arms, at 3 separate times, over a couple weeks.   Over 130/80 mmHg should be considered a risk factor and may require treatment in patients with diabetes.  Blood pressure readings over 120/80 mmHg are called "pre-hypertension" even in non-diabetic patients. To get a true blood pressure measurement, use the following guidelines. Be aware of the factors that can alter blood pressure readings.  Take measurements at least 1 hour after caffeine.   Take measurements 30 minutes after smoking and without any stress. This is another reason to quit smoking - it raises your blood pressure.   Use a proper cuff size. Ask your caregiver if you are not sure about your cuff size.   Most home blood pressure cuffs are automatic. They will measure systolic  and diastolic pressures. The systolic pressure is the pressure reading at the start of sounds. Diastolic pressure is the pressure at which the sounds disappear. If you are elderly, measure pressures in multiple postures. Try sitting, lying or standing.   Sit at rest for a minimum  of 5 minutes before taking measurements.   You should not be on any medications like decongestants. These are found in many cold medications.   Record your blood pressure readings and review them with your caregiver.  If you have hypertension:  Your caregiver may do tests to be sure you do not have secondary hypertension (see "causes" above).   Your caregiver may also look for signs of metabolic syndrome. This is also called Syndrome X or Insulin Resistance Syndrome. You may have this syndrome if you have type 2 diabetes, abdominal obesity, and abnormal blood lipids in addition to hypertension.   Your caregiver will take your medical and family history and perform a physical exam.   Diagnostic tests may include blood tests (for glucose, cholesterol, potassium, and kidney function), a urinalysis, or an EKG. Other tests may also be necessary depending on your condition.  PREVENTION  There are important lifestyle issues that you can adopt to reduce your chance of developing hypertension:  Maintain a normal weight.   Limit the amount of salt (sodium) in your diet.   Exercise often.   Limit alcohol intake.   Get enough potassium in your diet. Discuss specific advice with your caregiver.   Follow a DASH diet (dietary approaches to stop hypertension). This diet is rich in fruits, vegetables, and low-fat dairy products, and avoids certain fats.  PROGNOSIS  Essential hypertension cannot be cured. Lifestyle changes and medical treatment can lower blood pressure and reduce complications. The prognosis of secondary hypertension depends on the underlying cause. Many people whose hypertension is controlled with medicine or lifestyle changes can live a normal, healthy life.  RISKS AND COMPLICATIONS  While high blood pressure alone is not an illness, it often requires treatment due to its short- and long-term effects on many organs. Hypertension increases your risk for:  CVAs or strokes  (cerebrovascular accident).   Heart failure due to chronically high blood pressure (hypertensive cardiomyopathy).   Heart attack (myocardial infarction).   Damage to the retina (hypertensive retinopathy).   Kidney failure (hypertensive nephropathy).  Your caregiver can explain list items above that apply to you. Treatment of hypertension can significantly reduce the risk of complications. TREATMENT   For overweight patients, weight loss and regular exercise are recommended. Physical fitness lowers blood pressure.   Mild hypertension is usually treated with diet and exercise. A diet rich in fruits and vegetables, fat-free dairy products, and foods low in fat and salt (sodium) can help lower blood pressure. Decreasing salt intake decreases blood pressure in a 1/3 of people.   Stop smoking if you are a smoker.  The steps above are highly effective in reducing blood pressure. While these actions are easy to suggest, they are difficult to achieve. Most patients with moderate or severe hypertension end up requiring medications to bring their blood pressure down to a normal level. There are several classes of medications for treatment. Blood pressure pills (antihypertensives) will lower blood pressure by their different actions. Lowering the blood pressure by 10 mmHg may decrease the risk of complications by as much as 25%. The goal of treatment is effective blood pressure control. This will reduce your risk for complications. Your caregiver will help you determine the best  treatment for you according to your lifestyle. What is excellent treatment for one person, may not be for you. HOME CARE INSTRUCTIONS   Do not smoke.   Follow the lifestyle changes outlined in the "Prevention" section.   If you are on medications, follow the directions carefully. Blood pressure medications must be taken as prescribed. Skipping doses reduces their benefit. It also puts you at risk for problems.   Follow up with  your caregiver, as directed.   If you are asked to monitor your blood pressure at home, follow the guidelines in the "Diagnosis" section above.  SEEK MEDICAL CARE IF:   You think you are having medication side effects.   You have recurrent headaches or lightheadedness.   You have swelling in your ankles.   You have trouble with your vision.  SEEK IMMEDIATE MEDICAL CARE IF:   You have sudden onset of chest pain or pressure, difficulty breathing, or other symptoms of a heart attack.   You have a severe headache.   You have symptoms of a stroke (such as sudden weakness, difficulty speaking, difficulty walking).  MAKE SURE YOU:   Understand these instructions.   Will watch your condition.   Will get help right away if you are not doing well or get worse.  Document Released: 01/06/2005 Document Revised: 09/18/2010 Document Reviewed: 08/06/2006 [facility] Patient Information 2012 [place].

## 2011-03-05 NOTE — ED Notes (Signed)
Pt states that she has not been on her bp meds for 4 months that she has not seen her pcp and keeps a headache and last night bp 160/102.

## 2011-03-05 NOTE — ED Provider Notes (Signed)
Medical screening examination/treatment/procedure(s) were performed by non-physician practitioner and as supervising physician I was immediately available for consultation/collaboration.   Julianne Rice, MD 03/05/11 [phone]

## 2011-03-05 NOTE — ED Provider Notes (Signed)
History     CSN: [id number]  Arrival date & time 03/05/11  [id number]   First MD Initiated Contact with Patient 03/05/11 [phone]      Chief Complaint  Patient presents with  . Hypertension  . Headache    (Consider location/radiation/quality/duration/timing/severity/associated sxs/prior treatment) HPI Comments: Patient presents emergency Department with chief complaint of headache the last couple of weeks and high blood pressure.  Patient states that [facility] and [facility] medication however she has not been to see them for the last 4 months and does not have blood pressure medication currently.  Patient was at work yesterday with a blood pressure of 160/102. Pt reports vertigo but denies dysequilibrium ataxia, weakness, slurred speech, facial droop, COP, SOB, DOE, diplopia, cough, fevers night sweats, chills, urinary s/s or change in BM.     Patient is a 51 y.o. female presenting with hypertension and headaches. The history is provided by the patient.  Hypertension This is a recurrent problem. Associated symptoms include headaches. Pertinent negatives include no abdominal pain, chest pain, chills, congestion, fever, nausea, numbness or weakness.  Headache  Pertinent negatives include no fever, no palpitations, no shortness of breath and no nausea.    Past Medical History  Diagnosis Date  . Hypertension     History reviewed. No pertinent past surgical history.  Family History  Problem Relation Age of Onset  . Asthma Mother   . Hypertension Mother   . Asthma Father   . Hypertension Father     History  Substance Use Topics  . Smoking status: Current Everyday Smoker  . Smokeless tobacco: Current User  . Alcohol Use: No    OB History    Grav Para Term Preterm Abortions TAB SAB Ect Mult Living                  Review of Systems  Constitutional: Negative for fever, chills and appetite change.  HENT: Negative for congestion.   Eyes: Positive  for photophobia (like typical migraine HA). Negative for pain, discharge, redness, itching and visual disturbance.  Respiratory: Negative for shortness of breath.   Cardiovascular: Negative for chest pain, palpitations and leg swelling.  Gastrointestinal: Negative for nausea and abdominal pain.  Genitourinary: Negative for dysuria, urgency and frequency.  Neurological: Positive for dizziness (vertigo ) and headaches. Negative for tremors, seizures, syncope, facial asymmetry, speech difficulty, weakness, light-headedness and numbness.  Psychiatric/Behavioral: Negative for confusion.  All other systems reviewed and are negative.    Allergies  Review of patient's allergies indicates no known allergies.  Home Medications   Current Outpatient Rx  Name Route Sig Dispense Refill  . GOODY HEADACHE PO Oral Take by mouth every 4 (four) hours as needed. For headache      BP 152/95  Pulse 85  Temp(Src) 98.7 F (37.1 C) (Oral)  Resp 20  SpO2 100%  Physical Exam  Nursing note and vitals reviewed. Constitutional: She is oriented to person, place, and time. Vital signs are normal. She appears well-developed and well-nourished. She does not have a sickly appearance. No distress.  HENT:  Head: Normocephalic and atraumatic.  Right Ear: Hearing, tympanic membrane, external ear and ear canal normal.  Left Ear: Hearing, tympanic membrane, external ear and ear canal normal.       No vesicles in external auditory canal. No scaring of TM. Pt able to hear finger rub bilaterally. No mastoid tenderness  Eyes: Conjunctivae and EOM are normal. Pupils are equal, round,  and reactive to light. No scleral icterus.  Neck: Normal range of motion and full passive range of motion without pain. Neck supple. Normal carotid pulses and no JVD present. Carotid bruit is not present. No rigidity. Normal range of motion present. No Brudzinski's sign noted.  Cardiovascular: Normal rate, regular rhythm, S1 normal, S2 normal,  normal heart sounds, intact distal pulses and normal pulses.  Exam reveals no gallop and no friction rub.   No murmur heard.      No pitting edema bilaterally, RRR, no aberrant sounds on auscultations, distal pulses intact, no carotid bruit or JVD.   Pulmonary/Chest: Effort normal and breath sounds normal. No accessory muscle usage or stridor. No respiratory distress. She has no wheezes. She exhibits no tenderness and no bony tenderness.  Abdominal: Bowel sounds are normal.       Soft non tender. Non pulsatile aorta.   Musculoskeletal: Normal range of motion.  Neurological: She is alert and oriented to person, place, and time. She has normal strength. No cranial nerve deficit or sensory deficit. She displays a negative Romberg sign. Coordination and gait normal. GCS eye subscore is 4. GCS verbal subscore is 5. GCS motor subscore is 6.       A&O x3.  Able to follow commands. PERRL, EOMs, Shoulder shrug, facial muscles, tongue protrusion and swallow intact.  Motor strength 5/5 bilaterally including grip strength, triceps, hamstrings and ankle dorsiflexion.  Normal patellar DTRs.  Light touch intact in all 4 distal limbs.  Intact finger to nose, shin to heel and rapid alternating movements.No vertical or bidirectional nystagmus. Alternating cover test w no vertical disconjugate gaze, No binocular diplopia.    Skin: Skin is warm, dry and intact. No rash noted. She is not diaphoretic. No cyanosis. Nails show no clubbing.  Psychiatric: She has a normal mood and affect. Her behavior is normal.    ED Course  Procedures (including critical care time)  Labs Reviewed - No data to display No results found.   No diagnosis found.    MDM  Hypertension, HA  Patient hemodynamically stable in no acute distress and neurovascularly intact.  Physical exam non-concerning for posterior stroke, no neurological deficits and normal neuro exam.  Patient ambulated in ED without ataxia, gait normal. Pt HA treated in  ED w Reglan and Phenergan. Patient [facility].  Patient be discharged with short dose of thiazide diuretic.  Patient verbalizes understanding and is agreeable to follow up        Verl Dicker, PA-C 03/05/11 [phone]

## 2011-05-06 ENCOUNTER — Encounter (HOSPITAL_COMMUNITY): Payer: Self-pay | Admitting: Adult Health

## 2011-05-06 ENCOUNTER — Emergency Department (HOSPITAL_COMMUNITY)
Admission: EM | Admit: 2011-05-06 | Discharge: 2011-05-07 | Disposition: A | Discharge status: Home / Self Care | Payer: Self-pay | Attending: Emergency Medicine | Admitting: Emergency Medicine

## 2011-05-06 ENCOUNTER — Inpatient Hospital Stay (HOSPITAL_COMMUNITY): Payer: Self-pay

## 2011-05-06 ENCOUNTER — Inpatient Hospital Stay (HOSPITAL_COMMUNITY)
Admission: AD | Admit: 2011-05-06 | Discharge: 2011-05-06 | Disposition: A | Discharge status: Home / Self Care | Payer: Self-pay | Source: Ambulatory Visit | Attending: Obstetrics and Gynecology | Admitting: Obstetrics and Gynecology

## 2011-05-06 ENCOUNTER — Encounter (HOSPITAL_COMMUNITY): Payer: Self-pay | Admitting: *Deleted

## 2011-05-06 DIAGNOSIS — S61409A Unspecified open wound of unspecified hand, initial encounter: Secondary | ICD-10-CM | POA: Insufficient documentation

## 2011-05-06 DIAGNOSIS — D219 Benign neoplasm of connective and other soft tissue, unspecified: Secondary | ICD-10-CM

## 2011-05-06 DIAGNOSIS — Z79899 Other long term (current) drug therapy: Secondary | ICD-10-CM | POA: Insufficient documentation

## 2011-05-06 DIAGNOSIS — M79609 Pain in unspecified limb: Secondary | ICD-10-CM | POA: Insufficient documentation

## 2011-05-06 DIAGNOSIS — Y93G1 Activity, food preparation and clean up: Secondary | ICD-10-CM | POA: Insufficient documentation

## 2011-05-06 DIAGNOSIS — B3731 Acute candidiasis of vulva and vagina: Secondary | ICD-10-CM

## 2011-05-06 DIAGNOSIS — W268XXA Contact with other sharp object(s), not elsewhere classified, initial encounter: Secondary | ICD-10-CM | POA: Insufficient documentation

## 2011-05-06 DIAGNOSIS — R1031 Right lower quadrant pain: Secondary | ICD-10-CM | POA: Insufficient documentation

## 2011-05-06 DIAGNOSIS — IMO0002 Reserved for concepts with insufficient information to code with codable children: Secondary | ICD-10-CM

## 2011-05-06 DIAGNOSIS — B373 Candidiasis of vulva and vagina: Secondary | ICD-10-CM

## 2011-05-06 DIAGNOSIS — I10 Essential (primary) hypertension: Secondary | ICD-10-CM | POA: Insufficient documentation

## 2011-05-06 DIAGNOSIS — F17200 Nicotine dependence, unspecified, uncomplicated: Secondary | ICD-10-CM | POA: Insufficient documentation

## 2011-05-06 DIAGNOSIS — F3289 Other specified depressive episodes: Secondary | ICD-10-CM | POA: Insufficient documentation

## 2011-05-06 DIAGNOSIS — D259 Leiomyoma of uterus, unspecified: Secondary | ICD-10-CM

## 2011-05-06 DIAGNOSIS — F329 Major depressive disorder, single episode, unspecified: Secondary | ICD-10-CM | POA: Insufficient documentation

## 2011-05-06 HISTORY — DX: Polycystic kidney, unspecified: Q61.3

## 2011-05-06 HISTORY — DX: Depression, unspecified: F32.A

## 2011-05-06 LAB — URINALYSIS, ROUTINE W REFLEX MICROSCOPIC
Bilirubin Urine: NEGATIVE
Glucose, UA: NEGATIVE mg/dL
Hgb urine dipstick: NEGATIVE
Ketones, ur: NEGATIVE mg/dL
Leukocytes, UA: NEGATIVE
Nitrite: NEGATIVE
Protein, ur: NEGATIVE mg/dL
Specific Gravity, Urine: 1.015 (ref 1.005–1.030)
Urobilinogen, UA: 0.2 mg/dL (ref 0.0–1.0)
pH: 6 (ref 5.0–8.0)

## 2011-05-06 LAB — WET PREP, GENITAL
Clue Cells Wet Prep HPF POC: NONE SEEN
Trich, Wet Prep: NONE SEEN

## 2011-05-06 LAB — POCT PREGNANCY, URINE: Preg Test, Ur: NEGATIVE

## 2011-05-06 MED ORDER — HYDROCHLOROTHIAZIDE 12.5 MG PO CAPS: 12.5000 mg | ORAL_CAPSULE | Freq: Every day | ORAL | Status: DC

## 2011-05-06 MED ORDER — FLUCONAZOLE 150 MG PO TABS: 150.0000 mg | ORAL_TABLET | Freq: Once | ORAL | Status: AC

## 2011-05-06 MED ORDER — KETOROLAC TROMETHAMINE 60 MG/2ML IM SOLN
60.0000 mg | Freq: Once | INTRAMUSCULAR | Status: AC
  Administered 2011-05-06: 60 mg via INTRAMUSCULAR
  Filled 2011-05-06: qty 2

## 2011-05-06 NOTE — ED Notes (Signed)
Laceration to right pinky finger while washing dishes, cut on glass.

## 2011-05-06 NOTE — MAU Note (Signed)
Pt has been having lower abdominal pain for past 3-4 weeks; pt has not had a period in 6 months; she is out of her BP medicine and has no more refills (HCTZ 12.5 mg qd); Has been taking BC powders for the pain with some relief; also having headaches and visual changes;

## 2011-05-06 NOTE — MAU Provider Note (Signed)
History   Pt presents today c/o RLQ pain that comes and goes for the past 3wks. She also c/o vag dc. She denies N&V, fever, diarrhea, or any other sx.  CSN: [id number]  Arrival date and time: 05/06/11 1023   First Provider Initiated Contact with Patient 05/06/11 1121      Chief Complaint  Patient presents with  . Abdominal Pain   HPI  OB History    Grav Para Term Preterm Abortions TAB SAB Ect Mult Living   5 2 2  3 2 1   2       Past Medical History  Diagnosis Date  . Hypertension   . Polycystic kidney disease   . Ovarian cyst   . Depression     History reviewed. No pertinent past surgical history.  Family History  Problem Relation Age of Onset  . Asthma Mother   . Hypertension Mother   . Asthma Father   . Hypertension Father     History  Substance Use Topics  . Smoking status: Current Everyday Smoker -- .5 years    Types: Cigars  . Smokeless tobacco: Current User  . Alcohol Use: No    Allergies: No Known Allergies  Prescriptions prior to admission  Medication Sig Dispense Refill  . hydrochlorothiazide (MICROZIDE) 12.5 MG capsule Take 1 capsule (12.5 mg total) by mouth daily.  30 capsule  0    Review of Systems  Constitutional: Negative for fever and chills.  Eyes: Negative for blurred vision and double vision.  Respiratory: Negative for cough, hemoptysis, sputum production, shortness of breath and wheezing.   Cardiovascular: Negative for chest pain and palpitations.  Gastrointestinal: Positive for abdominal pain. Negative for nausea, vomiting, diarrhea, constipation and blood in stool.  Genitourinary: Negative for dysuria, urgency, frequency and hematuria.  Neurological: Negative for dizziness and headaches.  Psychiatric/Behavioral: Negative for depression and suicidal ideas.   Physical Exam   Blood pressure 158/96, pulse 74, temperature 97.6 F (36.4 C), temperature source Oral, resp. rate 16, height 5' 4.5" (1.638 m), weight 167 lb (75.751 kg),  SpO2 100.00%.  Physical Exam  Nursing note and vitals reviewed. Constitutional: She is oriented to person, place, and time. She appears well-developed and well-nourished. No distress.  HENT:  Head: Normocephalic and atraumatic.  Eyes: EOM are normal. Pupils are equal, round, and reactive to light.  GI: Soft. She exhibits no distension and no mass. There is tenderness. There is no rebound and no guarding.  Genitourinary: No bleeding around the vagina. Vaginal discharge found.       Thick, white vag dc present. Uterus enlarged and irregular in shape and size, consistent with fibroids.  Neurological: She is alert and oriented to person, place, and time.  Skin: Skin is warm and dry. She is not diaphoretic.  Psychiatric: Her behavior is normal. Judgment and thought content normal.    MAU Course  Procedures  Wet prep and GC/Chlamydia cultures done.  Results for orders placed during the hospital encounter of 05/06/11 (from the past 24 hour(s))  URINALYSIS, ROUTINE W REFLEX MICROSCOPIC     Status: Normal   Collection Time   05/06/11 10:55 AM      Component Value Range   Color, Urine YELLOW  YELLOW    APPearance CLEAR  CLEAR    Specific Gravity, Urine 1.015  1.005 - 1.030    pH 6.0  5.0 - 8.0    Glucose, UA NEGATIVE  NEGATIVE (mg/dL)   Hgb urine dipstick NEGATIVE  NEGATIVE  Bilirubin Urine NEGATIVE  NEGATIVE    Ketones, ur NEGATIVE  NEGATIVE (mg/dL)   Protein, ur NEGATIVE  NEGATIVE (mg/dL)   Urobilinogen, UA 0.2  0.0 - 1.0 (mg/dL)   Nitrite NEGATIVE  NEGATIVE    Leukocytes, UA NEGATIVE  NEGATIVE   WET PREP, GENITAL     Status: Abnormal   Collection Time   05/06/11 11:30 AM      Component Value Range   Yeast Wet Prep HPF POC MODERATE (*) NONE SEEN    Trich, Wet Prep NONE SEEN  NONE SEEN    Clue Cells Wet Prep HPF POC NONE SEEN  NONE SEEN    WBC, Wet Prep HPF POC FEW (*) NONE SEEN   POCT PREGNANCY, URINE     Status: Normal   Collection Time   05/06/11  1:40 PM      Component  Value Range   Preg Test, Ur NEGATIVE  NEGATIVE    US shows multiple large fibroids essentially unchanged since last [place] in 2012.  Pt pain essentially resolved since Toradol.  Assessment and Plan  Abd pain/fibroids: discussed with pt at length. Advised her to seek GYN care to consider long-term management of fibroids.  Yeast: will tx with diflucan. Discussed diet, activity, risks, and precautions.  Alinda Deem. Allesandra Huebsch III, DrHSc, MPAS, PA-C  05/06/2011, 11:46 AM

## 2011-05-06 NOTE — MAU Note (Signed)
Patient states she has been having lower abdominal pain and a thick white vaginal discharge for about 3 weeks. Takes medication that makes it better the starts again. Has not had a period in 6 months.

## 2011-05-06 NOTE — Discharge Instructions (Signed)
Candidal Vulvovaginitis Candidal vulvovaginitis is an infection of the vagina and vulva. The vulva is the skin around the opening of the vagina. This may cause itching and discomfort in and around the vagina.  HOME CARE  Only take medicine as told by your doctor.   Do not have sex (intercourse) until the infection is healed or as told by your doctor.   Practice safe sex.   Tell your sex partner about your infection.   Do not douche or use tampons.   Wear cotton underwear. Do not wear tight pants or panty hose.   Eat yogurt. This may help treat and prevent yeast infections.  GET HELP RIGHT AWAY IF:   You have a fever.   Your problems get worse during treatment or do not get better in 3 days.   You have discomfort, irritation, or itching in your vagina or vulva area.   You have pain after sex.   You start to get belly (abdominal) pain.  MAKE SURE YOU:  Understand these instructions.   Will watch your condition.   Will get help right away if you are not doing well or get worse.  Document Released: 04/04/2008 Document Revised: 12/26/2010 Document Reviewed: 04/04/2008 [facility] Patient Information 2012 [place].Fibroids You have been diagnosed as having a fibroid. Fibroids are smooth muscle lumps (tumors) which can occur any place in a woman's body. They are usually in the womb (uterus). The most common problem (symptom) of fibroids is bleeding. Over time this may cause low red blood cells (anemia). Other symptoms include feelings of pressure and pain in the pelvis. The diagnosis (learning what is wrong) of fibroids is made by physical exam. Sometimes tests such as an ultrasound are used. This is helpful when fibroids are felt around the ovaries and to look for tumors. TREATMENT   Most fibroids do not need surgical or medical treatment. Sometimes a tissue sample (biopsy) of the lining of the uterus is done to rule out cancer. If there is no cancer and only a small amount of  bleeding, the problem can be watched.   Hormonal treatment can improve the problem.   When surgery is needed, it can consist of removing the fibroid. Vaginal birth may not be possible after the removal of fibroids. This depends on where they are and the extent of surgery. When pregnancy occurs with fibroids it is usually normal.   Your caregiver can help decide which treatments are best for you.  HOME CARE INSTRUCTIONS   Do not use aspirin as this may increase bleeding problems.   If your periods (menses) are heavy, record the number of pads or tampons used per month. Bring this information to your caregiver. This can help them determine the best treatment for you.  SEEK IMMEDIATE MEDICAL CARE IF:  You have pelvic pain or cramps not controlled with medications, or experience a sudden increase in pain.   You have an increase of pelvic bleeding between and during menses.   You feel lightheaded or have fainting spells.   You develop worsening belly (abdominal) pain.  Document Released: 01/04/2000 Document Revised: 12/26/2010 Document Reviewed: 08/26/2007 [facility] Patient Information 2012 [place].

## 2011-05-07 LAB — GC/CHLAMYDIA PROBE AMP, GENITAL
Chlamydia, DNA Probe: NEGATIVE
GC Probe Amp, Genital: NEGATIVE

## 2011-05-07 MED ORDER — OXYCODONE-ACETAMINOPHEN 5-325 MG PO TABS
1.0000 | ORAL_TABLET | Freq: Once | ORAL | Status: AC
  Administered 2011-05-07: 1 via ORAL
  Filled 2011-05-07: qty 1

## 2011-05-07 NOTE — ED Provider Notes (Signed)
History     CSN: [id number]  Arrival date & time 05/06/11  2120   First MD Initiated Contact with Patient 05/07/11 0029      Chief Complaint  Patient presents with  . Laceration    (Consider location/radiation/quality/duration/timing/severity/associated sxs/prior treatment) Patient is a 51 y.o. female presenting with skin laceration. The history is provided by the patient and the spouse. A language interpreter was used.  Laceration  The incident occurred 1 to 2 hours ago. Pain location: r pinky between fingers. The laceration is 2 cm in size. The pain is at a severity of 3/10. The pain is mild. She reports no foreign bodies present. Her tetanus status is UTD.   Patient reports that she was washing dishes tonight and a piece of glass cut her finger between her fourth and fifth finger. Bleeding controlled.  Good extensor and flexor position.  Past Medical History  Diagnosis Date  . Hypertension   . Polycystic kidney disease   . Ovarian cyst   . Depression     History reviewed. No pertinent past surgical history.  Family History  Problem Relation Age of Onset  . Asthma Mother   . Hypertension Mother   . Asthma Father   . Hypertension Father     History  Substance Use Topics  . Smoking status: Current Everyday Smoker -- .5 years    Types: Cigars  . Smokeless tobacco: Current User  . Alcohol Use: No    OB History    Grav Para Term Preterm Abortions TAB SAB Ect Mult Living   5 2 2  3 2 1   2       Review of Systems  Constitutional: Negative.   HENT: Negative.   Eyes: Negative.   Respiratory: Negative.   Cardiovascular: Negative.   Gastrointestinal: Negative.   Skin:       Lac btwn 4-5 finger  Neurological: Negative.   Psychiatric/Behavioral: Negative.   All other systems reviewed and are negative.    Allergies  Review of patient's allergies indicates no known allergies.  Home Medications   Current Outpatient Rx  Name Route Sig Dispense Refill  .  FLUCONAZOLE 150 MG PO TABS Oral Take 1 tablet (150 mg total) by mouth once. 1 tablet 0  . HYDROCHLOROTHIAZIDE 12.5 MG PO CAPS Oral Take 1 capsule (12.5 mg total) by mouth daily. 30 capsule 0    BP 146/95  Pulse 80  Temp(Src) 98.5 F (36.9 C) (Oral)  Resp 16  SpO2 99%  Physical Exam  Nursing note and vitals reviewed. Constitutional: She is oriented to person, place, and time. She appears well-developed and well-nourished.  HENT:  Head: Normocephalic and atraumatic.  Eyes: Conjunctivae and EOM are normal. Pupils are equal, round, and reactive to light.  Neck: Normal range of motion. Neck supple.  Cardiovascular: Normal rate.   Pulmonary/Chest: Effort normal.  Abdominal: Soft.  Musculoskeletal: Normal range of motion. She exhibits no edema and no tenderness.  Neurological: She is alert and oriented to person, place, and time. She has normal reflexes.  Skin: Skin is warm and dry.       63m lac btw 4-5 R fingers.  Bleeding controlled.  Good sensation, flexor and extension.  Psychiatric: She has a normal mood and affect.    ED Course  LACERATION REPAIR Date/Time: 05/07/2011 12:29 AM Performed by: Julieta Bellini Authorized by: Julieta Bellini Consent: Verbal consent obtained. Written consent not obtained. Risks and benefits: risks, benefits and alternatives were discussed Consent given by: patient  Patient understanding: patient states understanding of the procedure being performed Imaging studies: imaging studies not available Patient identity confirmed: verbally with patient, arm band, provided demographic data and hospital-assigned identification number Time out: Immediately prior to procedure a "time out" was called to verify the correct patient, procedure, equipment, support staff and site/side marked as required. Body area: upper extremity Location details: right small finger Laceration length: 2 cm Foreign bodies: glass Tendon involvement: none Nerve involvement:  none Vascular damage: no Anesthesia: local infiltration Local anesthetic: lidocaine 2% without epinephrine Patient sedated: no Preparation: Patient was prepped and draped in the usual sterile fashion. Irrigation solution: saline Irrigation method: syringe Amount of cleaning: standard Debridement: none Skin closure: 4-0 Prolene Number of sutures: 3 Technique: simple Approximation: close Approximation difficulty: simple Dressing: antibiotic ointment and 4x4 sterile gauze Patient tolerance: Patient tolerated the procedure well with no immediate complications.   (including critical care time)  Labs Reviewed - No data to display US Transvaginal Non-ob  05/06/2011  *RADIOLOGY REPORT*  Clinical Data: The right lower quadrant pain, fibroid uterus  TRANSABDOMINAL AND TRANSVAGINAL ULTRASOUND OF PELVIS Technique:  Both transabdominal and transvaginal ultrasound examinations of the pelvis were performed. Transabdominal technique was performed for global imaging of the pelvis including uterus, ovaries, adnexal regions, and pelvic cul-de-sac.  Comparison: March 08, 2010   It was necessary to proceed with endovaginal exam following the transabdominal exam to visualize the endometrium and adnexa.  Findings:  The uterus is enlarged and lobular in contour, measuring 13.2 x 5.9 x 6.1 cm (previously 9.8 x 5.5 x 6.2 cm).  Multiple fibroids are again noted.  The largest is broad-based subserosal at the fundus (6.8 cm, unchanged.) There is also a 4.3 cm posterior left mid segment myometrial fibroid and a 3.6 cm anterior mid segment broad- based subserosal fibroid, neither of which appear to be significantly changed.  Numerous additional myometrial and broad- based subserosal fibroids remain present and not significantly changed.  The endometrial stripe is difficult to follow in its entirety because of the fibroids but is not thickened, measuring 4 mm in width.  Both ovaries have a normal size and appearance.  The  right ovary measures 3.1 x 1.3 x 1.4 cm, and the left ovary measures 3.5 x 1.4 x 1.5 cm.  IMPRESSION: Fibroid uterus, as described above.  Original Report Authenticated By: Duayne Cal, M.D.   US Pelvis Complete  05/06/2011  *RADIOLOGY REPORT*  Clinical Data: The right lower quadrant pain, fibroid uterus  TRANSABDOMINAL AND TRANSVAGINAL ULTRASOUND OF PELVIS Technique:  Both transabdominal and transvaginal ultrasound examinations of the pelvis were performed. Transabdominal technique was performed for global imaging of the pelvis including uterus, ovaries, adnexal regions, and pelvic cul-de-sac.  Comparison: March 08, 2010   It was necessary to proceed with endovaginal exam following the transabdominal exam to visualize the endometrium and adnexa.  Findings:  The uterus is enlarged and lobular in contour, measuring 13.2 x 5.9 x 6.1 cm (previously 9.8 x 5.5 x 6.2 cm).  Multiple fibroids are again noted.  The largest is broad-based subserosal at the fundus (6.8 cm, unchanged.) There is also a 4.3 cm posterior left mid segment myometrial fibroid and a 3.6 cm anterior mid segment broad- based subserosal fibroid, neither of which appear to be significantly changed.  Numerous additional myometrial and broad- based subserosal fibroids remain present and not significantly changed.  The endometrial stripe is difficult to follow in its entirety because of the fibroids but is not thickened, measuring 4 mm  in width.  Both ovaries have a normal size and appearance.  The right ovary measures 3.1 x 1.3 x 1.4 cm, and the left ovary measures 3.5 x 1.4 x 1.5 cm.  IMPRESSION: Fibroid uterus, as described above.  Original Report Authenticated By: Duayne Cal, M.D.     No diagnosis found.    MDM  Laceration repair.  Tetanus utd. No nerve or tendon damage.         Julieta Bellini, NP 05/07/11 1337

## 2011-05-07 NOTE — MAU Provider Note (Signed)
Agree with above note.  Shankar Silber 05/07/2011 7:13 AM

## 2011-05-07 NOTE — Discharge Instructions (Signed)
Ms Pinder we put 3 sutures between her fourth and fifth finger tonight. Did not update her tetanus because he had a tetanus 5 years ago. She the area clean and dry for 24 hours and he can wash it with soap and water. Return in 5-7 days to have her sutures removed worker to a doctor of her choice.  Laceration Care, Adult A laceration is a cut that goes through all layers of the skin. The cut goes into the tissue beneath the skin. HOME CARE For stitches (sutures) or staples:  Keep the cut clean and dry.   If you have a bandage (dressing), change it at least once a day. Change the bandage if it gets wet or dirty, or as told by your doctor.   Wash the cut with soap and water 2 times a day. Rinse the cut with water. Pat it dry with a clean towel.   Put a thin layer of medicated cream on the cut as told by your doctor.   You may shower after the first 24 hours. Do not soak the cut in water until the stitches are removed.   Only take medicines as told by your doctor.   Have your stitches or staples removed as told by your doctor.  For skin adhesive strips:  Keep the cut clean and dry.   Do not get the strips wet. You may take a bath, but be careful to keep the cut dry.   If the cut gets wet, pat it dry with a clean towel.   The strips will fall off on their own. Do not remove the strips that are still stuck to the cut.  For wound glue:  You may shower or take baths. Do not soak or scrub the cut. Do not swim. Avoid heavy sweating until the glue falls off on its own. After a shower or bath, pat the cut dry with a clean towel.   Do not put medicine on your cut until the glue falls off.   If you have a bandage, do not put tape over the glue.   Avoid lots of sunlight or tanning lamps until the glue falls off. Put sunscreen on the cut for the first year to reduce your scar.   The glue will fall off on its own. Do not pick at the glue.  You may need a tetanus shot if:  You cannot remember  when you had your last tetanus shot.   You have never had a tetanus shot.  If you need a tetanus shot and you choose not to have one, you may get tetanus. Sickness from tetanus can be serious. GET HELP RIGHT AWAY IF:   Your pain does not get better with medicine.   Your arm, hand, leg, or foot loses feeling (numbness) or changes color.   Your cut is bleeding.   Your joint feels weak, or you cannot use your joint.   You have painful lumps on your body.   Your cut is red, puffy (swollen), or painful.   You have a red line on the skin near the cut.   You have yellowish-white fluid (pus) coming from the cut.   You have a fever.   You have a bad smell coming from the cut or bandage.   Your cut breaks open before or after stitches are removed.   You notice something coming out of the cut, such as wood or glass.   You cannot move a finger or toe.  MAKE SURE YOU:   Understand these instructions.   Will watch your condition.   Will get help right away if you are not doing well or get worse.  Document Released: 06/25/2007 Document Revised: 12/26/2010 Document Reviewed: 07/02/2010 [facility] Patient Information 2012 [place].

## 2011-05-08 NOTE — ED Provider Notes (Signed)
Medical screening examination/treatment/procedure(s) were conducted as a shared visit with non-physician practitioner(s) and myself.  I personally evaluated the patient during the encounter  This is a superficial laceration.  She has normal flexion and extension of her finger.  Laceration repaired at the bedside by the mid-level provider  Hoy Morn, MD 05/08/11 [phone]

## 2011-05-27 ENCOUNTER — Encounter (HOSPITAL_COMMUNITY): Payer: Self-pay | Admitting: *Deleted

## 2011-05-27 ENCOUNTER — Emergency Department (HOSPITAL_COMMUNITY)
Admission: EM | Admit: 2011-05-27 | Discharge: 2011-05-28 | Disposition: A | Discharge status: Home / Self Care | Payer: Self-pay | Attending: Emergency Medicine | Admitting: Emergency Medicine

## 2011-05-27 DIAGNOSIS — M79646 Pain in unspecified finger(s): Secondary | ICD-10-CM

## 2011-05-27 DIAGNOSIS — M79609 Pain in unspecified limb: Secondary | ICD-10-CM | POA: Insufficient documentation

## 2011-05-27 DIAGNOSIS — F17200 Nicotine dependence, unspecified, uncomplicated: Secondary | ICD-10-CM | POA: Insufficient documentation

## 2011-05-27 DIAGNOSIS — I10 Essential (primary) hypertension: Secondary | ICD-10-CM | POA: Insufficient documentation

## 2011-05-27 NOTE — ED Notes (Signed)
Pt in c/o abscess to side of right 5th finger x 2 weeks, started after laceration to area

## 2011-05-27 NOTE — ED Notes (Signed)
Pt reports she cut her right 5th digit on medial side three weeks ago and has stitches. Pt reports laceration happened while washing dishes. Pt reports she noted swelling to area two weeks ago and then notes white bloody drainage from area today. Area is notably swollen, with white drainage.  Pt reports pain with palpation. 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

## 2011-05-28 ENCOUNTER — Emergency Department (HOSPITAL_COMMUNITY): Payer: Self-pay

## 2011-05-28 MED ORDER — CEPHALEXIN 250 MG PO CAPS: 250.0000 mg | ORAL_CAPSULE | Freq: Four times a day (QID) | ORAL | Status: AC

## 2011-05-28 NOTE — Discharge Instructions (Signed)
Your hand xray did not show any definite findings. It is possible that you have either a piece of the suture or a piece of glass that is stuck in your hand and causing your body to have a reaction. It is important for you to make a follow up with Dr. Amedeo Plenty, one of our hand surgeons, for further evaluation and treatment. Please contact his office for an appointment. You have been given a prescription for antibiotics. Please take ALL of these until gone. Return to the ER with fever, chills, or other concerns about worsening condition.  RESOURCE GUIDE  Dental Problems  Patients with Medicaid: [place] Phone:  [phone]                                                  Phone:  [phone]  If unable to pay or uninsured, contact:  [facility] or [facility]. to become qualified for the adult dental clinic.  Chronic Pain Problems Contact [facility]  [phone] Patients need to be referred by their primary care doctor.  Insufficient Money for Medicine Contact [employer]:  call "211" or [place] [phone].  No Primary Care Doctor Call [facility]  [phone] Other agencies that provide inexpensive medical care    [place]  [phone]    [facility] Internal [facility]  [place]  [phone]    [facility]  [phone]    [facility]  [place]  [place]  [phone] [place]   [phone] (emergency services [phone])  Substance Abuse [facility]  [phone] [facility] [phone] [facility] [place] [phone] [place] Pester [phone] Residential & Outpatient Substance Abuse Program   [phone]  Abuse/Neglect [place] [phone] [place] [phone] (After Hours)  Emergency [place] [phone]  [place] [facility] [place] [phone] [place] [phone]  MRSA Hotline #:   (732) 330-8367    [facility] Dept. [address]      [place]  [place]ela [place] Phone:  [phone]                                   Phone:  [phone]                 Phone:  [place] Phone:  [place] [phone] (After Hours)

## 2011-05-28 NOTE — ED Provider Notes (Signed)
History     CSN: [id number]  Arrival date & time 05/27/11  2139   First MD Initiated Contact with Patient 05/27/11 2358      Chief Complaint  Patient presents with  . Finger infection     (Consider location/radiation/quality/duration/timing/severity/associated sxs/prior treatment) HPI History from patient and previous chart. 51 year old female who presents with pain to her right fifth finger. She suffered a laceration to the web space on April 17 on broken glass, and was seen in the ED for laceration repair, and at that time sutures were placed. She states that she was instructed to return in 7-10 days for suture removal, but elected to attempt to take out the sutures herself at home. States she did this on the eighth day after placement. Her about the past 2 weeks, she has noted a "knot" to the area. She has had increased pain, which worsens when she is at work all day, as she works on an Designer, television/film set. She was at work today, when she noted some white-colored drainage and bleeding from the area. She also states that the finger felt "tight" today. Denies loss of range of motion. She has not had fever or chills.  Past Medical History  Diagnosis Date  . Hypertension   . Polycystic kidney disease   . Ovarian cyst   . Depression     History reviewed. No pertinent past surgical history.  Family History  Problem Relation Age of Onset  . Asthma Mother   . Hypertension Mother   . Asthma Father   . Hypertension Father     History  Substance Use Topics  . Smoking status: Current Everyday Smoker -- .5 years    Types: Cigars  . Smokeless tobacco: Current User  . Alcohol Use: No    OB History    Grav Para Term Preterm Abortions TAB SAB Ect Mult Living   5 2 2  3 2 1   2       Review of Systems  Constitutional: Negative for fever and chills.  Musculoskeletal: Positive for arthralgias. Negative for joint swelling.  Skin: Negative for color change and rash.  Neurological: Negative  for weakness and numbness.    Allergies  Review of patient's allergies indicates no known allergies.  Home Medications   Current Outpatient Rx  Name Route Sig Dispense Refill  . HYDROCHLOROTHIAZIDE 12.5 MG PO CAPS Oral Take 1 capsule (12.5 mg total) by mouth daily. 30 capsule 0    BP 164/101  Pulse 83  Temp(Src) 98.5 F (36.9 C) (Oral)  Resp 20  SpO2 100%  Physical Exam  Nursing note and vitals reviewed. Constitutional: She appears well-developed and well-nourished. No distress.       Afebrile  HENT:  Head: Normocephalic and atraumatic.  Neck: Normal range of motion.  Cardiovascular: Normal rate.   Pulmonary/Chest: Effort normal.  Musculoskeletal: Normal range of motion.       R hand: Small, raised area in web space extending onto 5th finger; healing incision seen to midline of this; possible granuloma. No drainage from the area. No overlying cellulitis or edema noted. Pt with FROM of finger at all IP jts and 5/5 strength to resistance. NVI.  Neurological: She is alert.  Skin: Skin is warm and dry. She is not diaphoretic.  Psychiatric: She has a normal mood and affect.    ED Course  Procedures (including critical care time)  NEEDLE DRAINAGE Performed by: Abran Richard Consent: Verbal consent obtained. Risks and benefits: risks, benefits and  alternatives were discussed  Body area: R 5th finger  Anesthesia: local infiltration  Local anesthetic: lidocaine 2% without epinephrine  Anesthetic total: 1 ml  Drainage: serosanguinous  Drainage amount: scant  Patient tolerance: Patient tolerated the procedure well with no immediate complications.   Labs Reviewed - No data to display Dg Hand Complete Right  05/28/2011  *RADIOLOGY REPORT*  Clinical Data: Laceration with subsequent soft tissue infection.  RIGHT HAND - COMPLETE 3+ VIEW  Comparison: No comparison studies available.  Findings: Three-view exam shows no fracture.  No subluxation or dislocation.  By  report, the patient has laceration is between the ring and little fingers.  No evidence for a retained radiopaque soft tissue foreign body.  Of note, glass can be occult on x-ray.  IMPRESSION: No acute bony findings.  Original Report Authenticated By: ERIC A. MANSELL, M.D.     1. Finger pain       MDM  This 51 year old female presents with finger pain after suture placement and self removal several weeks ago. On exam, she is not noted to have any overt drainage to the area. She has full ROM of finger without pain. We did proceed with x-ray, which did not show any obvious foreign body. Area was injected with lidocaine to attempt needle drainage which did produce a small amount of serosanguinous drainage without obvious purulent drainage. Question whether this is retained FB vs retained suture material. She has been afebrile and has no overt evidence of cellulitis; however, will start her on course of Keflex. Feel this area likely will require incision, drainage, and exploration to r/o retained material. Pt referred to hand surgery to have this performed. Emphasized importance of f/u and possible risks of infection to the hand. Return precautions discussed. She verbalized understanding and was agreeable with plan.       [place], [place] 05/28/11 [phone]

## 2011-05-31 NOTE — ED Provider Notes (Signed)
Medical screening examination/treatment/procedure(s) were performed by non-physician practitioner and as supervising physician I was immediately available for consultation/collaboration.   Mirna Mires, MD 05/31/11 [phone]

## 2011-07-16 ENCOUNTER — Encounter: Payer: Self-pay | Admitting: Obstetrics & Gynecology

## 2011-07-16 ENCOUNTER — Ambulatory Visit (INDEPENDENT_AMBULATORY_CARE_PROVIDER_SITE_OTHER): Payer: Self-pay | Admitting: Obstetrics & Gynecology

## 2011-07-16 VITALS — BP 128/85 | HR 83 | Temp 99.0°F | Ht 66.0 in | Wt 165.7 lb

## 2011-07-16 DIAGNOSIS — N8111 Cystocele, midline: Secondary | ICD-10-CM

## 2011-07-16 DIAGNOSIS — D219 Benign neoplasm of connective and other soft tissue, unspecified: Secondary | ICD-10-CM

## 2011-07-16 DIAGNOSIS — Z0000 Encounter for general adult medical examination without abnormal findings: Secondary | ICD-10-CM

## 2011-07-16 DIAGNOSIS — D259 Leiomyoma of uterus, unspecified: Secondary | ICD-10-CM

## 2011-07-16 DIAGNOSIS — Z01419 Encounter for gynecological examination (general) (routine) without abnormal findings: Secondary | ICD-10-CM

## 2011-07-16 DIAGNOSIS — N393 Stress incontinence (female) (male): Secondary | ICD-10-CM

## 2011-07-16 DIAGNOSIS — IMO0002 Reserved for concepts with insufficient information to code with codable children: Secondary | ICD-10-CM

## 2011-07-16 NOTE — Progress Notes (Signed)
  Subjective:    Patient ID: Kristen Moreno, female    DOB: Jul 14, 1960, 51 y.o.   MRN: [id number]  HPI  18 S AA P2 who is here today because she was told in the ER that she needs a hysterectomy. She complains of a 8 month h/o pelvic pain/RLQ pain. She says it is worse when she is standing at work. She hasn't had sex in about 2 months but denies h/o dysparunia. She went through menopause at age 37. An u/s 4/13 showed an enlarged uterus with multiple fibroids, actually growing since previous u/s.  She also complains of GSUI for about a year.  Review of Systems     Objective:   Physical Exam Mild atrophy Normal shaved vulva 3rd degree cystocele Wide pubic arch 14 week mobile fibroid uterus Non- larged adnexa       Assessment & Plan:  Menopausal fibroid uterus causing pain- I have discussed option including watchful waiting versus TAH versus RATH. At the time of surgery I will do a sling to help with her GSUI. I will plan to remove her ovaries and tubes as well.

## 2011-07-29 ENCOUNTER — Ambulatory Visit
Admission: RE | Admit: 2011-07-29 | Discharge: 2011-07-29 | Disposition: A | Discharge status: Home / Self Care | Payer: Self-pay | Source: Ambulatory Visit | Attending: Obstetrics & Gynecology | Admitting: Obstetrics & Gynecology

## 2011-07-29 DIAGNOSIS — Z0000 Encounter for general adult medical examination without abnormal findings: Secondary | ICD-10-CM

## 2011-08-01 ENCOUNTER — Encounter (HOSPITAL_COMMUNITY): Payer: Self-pay | Admitting: Pharmacist

## 2011-08-04 ENCOUNTER — Encounter (HOSPITAL_COMMUNITY)
Admission: RE | Admit: 2011-08-04 | Discharge: 2011-08-04 | Disposition: A | Discharge status: Home / Self Care | Payer: Self-pay | Source: Ambulatory Visit | Attending: Obstetrics & Gynecology | Admitting: Obstetrics & Gynecology

## 2011-08-04 ENCOUNTER — Encounter (HOSPITAL_COMMUNITY): Payer: Self-pay

## 2011-08-04 HISTORY — DX: Polycystic ovarian syndrome: E28.2

## 2011-08-04 LAB — CBC
HCT: 38.8 % (ref 36.0–46.0)
Hemoglobin: 13.1 g/dL (ref 12.0–15.0)
MCH: 32.2 pg (ref 26.0–34.0)
MCHC: 33.8 g/dL (ref 30.0–36.0)
MCV: 95.3 fL (ref 78.0–100.0)
Platelets: 297 10*3/uL (ref 150–400)
RBC: 4.07 MIL/uL (ref 3.87–5.11)
RDW: 13.5 % (ref 11.5–15.5)
WBC: 9.5 10*3/uL (ref 4.0–10.5)

## 2011-08-04 LAB — SURGICAL PCR SCREEN
MRSA, PCR: NEGATIVE
Staphylococcus aureus: POSITIVE — AB

## 2011-08-04 LAB — BASIC METABOLIC PANEL
BUN: 28 mg/dL — ABNORMAL HIGH (ref 6–23)
CO2: 25 mEq/L (ref 19–32)
Calcium: 9.3 mg/dL (ref 8.4–10.5)
Chloride: 102 mEq/L (ref 96–112)
Creatinine, Ser: 1.66 mg/dL — ABNORMAL HIGH (ref 0.50–1.10)
GFR calc Af Amer: 41 mL/min — ABNORMAL LOW (ref >90)
GFR calc non Af Amer: 35 mL/min — ABNORMAL LOW (ref >90)
Glucose, Bld: 100 mg/dL — ABNORMAL HIGH (ref 70–99)
Potassium: 3.9 mEq/L (ref 3.5–5.1)
Sodium: 138 mEq/L (ref 135–145)

## 2011-08-04 NOTE — Patient Instructions (Addendum)
YOUR PROCEDURE IS SCHEDULED ON:08/11/11  ENTER THROUGH THE MAIN ENTRANCE OF [facility] AT:6am  USE DESK PHONE AND DIAL [phone] TO INFORM us OF YOUR ARRIVAL  CALL [phone] IF YOU HAVE ANY QUESTIONS OR PROBLEMS PRIOR TO YOUR ARRIVAL.  REMEMBER: DO NOT EAT OR DRINK AFTER MIDNIGHT :Sunday      YOU MAY BRUSH YOUR TEETH THE MORNING OF SURGERY   TAKE THESE MEDICINES THE DAY OF SURGERY WITH SIP OF WATER: BP med   DO NOT WEAR JEWELRY, EYE MAKEUP, LIPSTICK OR DARK FINGERNAIL POLISH DO NOT WEAR LOTIONS  DO NOT SHAVE FOR 48 HOURS PRIOR TO SURGERY  YOU WILL NOT BE ALLOWED TO DRIVE YOURSELF HOME.

## 2011-08-05 ENCOUNTER — Emergency Department (HOSPITAL_COMMUNITY)
Admission: EM | Admit: 2011-08-05 | Discharge: 2011-08-06 | Disposition: A | Discharge status: Home / Self Care | Payer: Self-pay | Attending: Emergency Medicine | Admitting: Emergency Medicine

## 2011-08-05 ENCOUNTER — Other Ambulatory Visit: Payer: Self-pay

## 2011-08-05 ENCOUNTER — Encounter (HOSPITAL_COMMUNITY): Payer: Self-pay | Admitting: *Deleted

## 2011-08-05 ENCOUNTER — Emergency Department (HOSPITAL_COMMUNITY): Payer: Self-pay

## 2011-08-05 DIAGNOSIS — F3289 Other specified depressive episodes: Secondary | ICD-10-CM | POA: Insufficient documentation

## 2011-08-05 DIAGNOSIS — F17200 Nicotine dependence, unspecified, uncomplicated: Secondary | ICD-10-CM | POA: Insufficient documentation

## 2011-08-05 DIAGNOSIS — J189 Pneumonia, unspecified organism: Secondary | ICD-10-CM | POA: Insufficient documentation

## 2011-08-05 DIAGNOSIS — F329 Major depressive disorder, single episode, unspecified: Secondary | ICD-10-CM | POA: Insufficient documentation

## 2011-08-05 DIAGNOSIS — I10 Essential (primary) hypertension: Secondary | ICD-10-CM | POA: Insufficient documentation

## 2011-08-05 LAB — CBC WITH DIFFERENTIAL/PLATELET
Basophils Absolute: 0 10*3/uL (ref 0.0–0.1)
Basophils Relative: 0 % (ref 0–1)
Eosinophils Absolute: 0.2 10*3/uL (ref 0.0–0.7)
Eosinophils Relative: 3 % (ref 0–5)
HCT: 39.6 % (ref 36.0–46.0)
Hemoglobin: 13.3 g/dL (ref 12.0–15.0)
Lymphocytes Relative: 23 % (ref 12–46)
Lymphs Abs: 1.8 10*3/uL (ref 0.7–4.0)
MCH: 31.5 pg (ref 26.0–34.0)
MCHC: 33.6 g/dL (ref 30.0–36.0)
MCV: 93.8 fL (ref 78.0–100.0)
Monocytes Absolute: 0.4 10*3/uL (ref 0.1–1.0)
Monocytes Relative: 5 % (ref 3–12)
Neutro Abs: 5.6 10*3/uL (ref 1.7–7.7)
Neutrophils Relative %: 69 % (ref 43–77)
Platelets: 287 10*3/uL (ref 150–400)
RBC: 4.22 MIL/uL (ref 3.87–5.11)
RDW: 13.1 % (ref 11.5–15.5)
WBC: 8.1 10*3/uL (ref 4.0–10.5)

## 2011-08-05 MED ORDER — ACETAMINOPHEN 325 MG PO TABS
650.0000 mg | ORAL_TABLET | Freq: Once | ORAL | Status: AC
  Administered 2011-08-05: 650 mg via ORAL
  Filled 2011-08-05: qty 2

## 2011-08-05 NOTE — ED Notes (Signed)
Pt c/o chest pain, SOB, and fever since Saturday. States pain is in center of chest and is worse with breathing. Pt A&Ox's3. Appears in NAD at present.

## 2011-08-06 LAB — BASIC METABOLIC PANEL
BUN: 21 mg/dL (ref 6–23)
CO2: 20 mEq/L (ref 19–32)
Calcium: 9.1 mg/dL (ref 8.4–10.5)
Chloride: 101 mEq/L (ref 96–112)
Creatinine, Ser: 1.41 mg/dL — ABNORMAL HIGH (ref 0.50–1.10)
GFR calc Af Amer: 50 mL/min — ABNORMAL LOW (ref >90)
GFR calc non Af Amer: 43 mL/min — ABNORMAL LOW (ref >90)
Glucose, Bld: 105 mg/dL — ABNORMAL HIGH (ref 70–99)
Potassium: 3.9 mEq/L (ref 3.5–5.1)
Sodium: 134 mEq/L — ABNORMAL LOW (ref 135–145)

## 2011-08-06 MED ORDER — SODIUM CHLORIDE 0.9 % IV BOLUS (SEPSIS)
1000.0000 mL | Freq: Once | INTRAVENOUS | Status: AC
  Administered 2011-08-06: 1000 mL via INTRAVENOUS

## 2011-08-06 MED ORDER — NAPROXEN 500 MG PO TABS
500.0000 mg | ORAL_TABLET | Freq: Once | ORAL | Status: AC
  Administered 2011-08-06: 500 mg via ORAL
  Filled 2011-08-06: qty 1

## 2011-08-06 MED ORDER — DEXTROSE 5 % IV SOLN
500.0000 mg | Freq: Once | INTRAVENOUS | Status: AC
  Administered 2011-08-06: 500 mg via INTRAVENOUS
  Filled 2011-08-06: qty 500

## 2011-08-06 MED ORDER — DEXTROSE 5 % IV SOLN
1.0000 g | Freq: Once | INTRAVENOUS | Status: AC
  Administered 2011-08-06: 1 g via INTRAVENOUS
  Filled 2011-08-06: qty 10

## 2011-08-06 MED ORDER — AZITHROMYCIN 250 MG PO TABS: ORAL_TABLET | ORAL | Status: DC

## 2011-08-06 MED ORDER — ALBUTEROL SULFATE HFA 108 (90 BASE) MCG/ACT IN AERS
2.0000 | INHALATION_SPRAY | RESPIRATORY_TRACT | Status: DC | PRN
  Administered 2011-08-06: 2 via RESPIRATORY_TRACT
  Filled 2011-08-06: qty 6.7
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
## 2011-08-06 NOTE — ED Notes (Signed)
Family at bedside. No issues. Needs assessed

## 2011-08-11 ENCOUNTER — Encounter (HOSPITAL_COMMUNITY): Payer: Self-pay | Admitting: Anesthesiology

## 2011-08-11 ENCOUNTER — Ambulatory Visit (HOSPITAL_COMMUNITY)
Admission: RE | Admit: 2011-08-11 | Discharge: 2011-08-11 | Disposition: A | Discharge status: Home / Self Care | Payer: Self-pay | Source: Ambulatory Visit | Attending: Obstetrics & Gynecology | Admitting: Obstetrics & Gynecology

## 2011-08-11 ENCOUNTER — Ambulatory Visit (HOSPITAL_COMMUNITY): Payer: Self-pay | Admitting: Anesthesiology

## 2011-08-11 ENCOUNTER — Encounter (HOSPITAL_COMMUNITY): Payer: Self-pay

## 2011-08-11 ENCOUNTER — Encounter (HOSPITAL_COMMUNITY)
Admission: RE | Disposition: A | Discharge status: Home / Self Care | Payer: Self-pay | Source: Ambulatory Visit | Attending: Obstetrics & Gynecology

## 2011-08-11 DIAGNOSIS — N8000 Endometriosis of the uterus, unspecified: Secondary | ICD-10-CM | POA: Insufficient documentation

## 2011-08-11 DIAGNOSIS — D251 Intramural leiomyoma of uterus: Secondary | ICD-10-CM | POA: Insufficient documentation

## 2011-08-11 DIAGNOSIS — N949 Unspecified condition associated with female genital organs and menstrual cycle: Secondary | ICD-10-CM | POA: Insufficient documentation

## 2011-08-11 DIAGNOSIS — N9489 Other specified conditions associated with female genital organs and menstrual cycle: Secondary | ICD-10-CM | POA: Insufficient documentation

## 2011-08-11 DIAGNOSIS — N393 Stress incontinence (female) (male): Secondary | ICD-10-CM | POA: Insufficient documentation

## 2011-08-11 DIAGNOSIS — D252 Subserosal leiomyoma of uterus: Secondary | ICD-10-CM | POA: Insufficient documentation

## 2011-08-11 DIAGNOSIS — N838 Other noninflammatory disorders of ovary, fallopian tube and broad ligament: Secondary | ICD-10-CM | POA: Insufficient documentation

## 2011-08-11 HISTORY — PX: BLADDER SUSPENSION: SHX72

## 2011-08-11 LAB — PREGNANCY, URINE: Preg Test, Ur: NEGATIVE

## 2011-08-11 SURGERY — ROBOTIC ASSISTED TOTAL HYSTERECTOMY WITH BILATERAL SALPINGO OOPHORECTOMY
Anesthesia: General | Site: Vagina | Wound class: Clean Contaminated

## 2011-08-11 MED ORDER — ROCURONIUM BROMIDE 100 MG/10ML IV SOLN
INTRAVENOUS | Status: DC | PRN
  Administered 2011-08-11: 20 mg via INTRAVENOUS
  Administered 2011-08-11: 50 mg via INTRAVENOUS

## 2011-08-11 MED ORDER — EPHEDRINE SULFATE 50 MG/ML IJ SOLN
INTRAMUSCULAR | Status: DC | PRN
  Administered 2011-08-11: 10 mg via INTRAVENOUS

## 2011-08-11 MED ORDER — CEFAZOLIN SODIUM-DEXTROSE 2-3 GM-% IV SOLR
2.0000 g | INTRAVENOUS | Status: AC
  Administered 2011-08-11: 2 g via INTRAVENOUS

## 2011-08-11 MED ORDER — LIDOCAINE HCL (CARDIAC) 20 MG/ML IV SOLN
INTRAVENOUS | Status: DC | PRN
  Administered 2011-08-11: 50 mg via INTRAVENOUS

## 2011-08-11 MED ORDER — SIMETHICONE 80 MG PO CHEW: 80.0000 mg | CHEWABLE_TABLET | Freq: Four times a day (QID) | ORAL | Status: DC | PRN

## 2011-08-11 MED ORDER — INDIGOTINDISULFONATE SODIUM 8 MG/ML IJ SOLN
INTRAMUSCULAR | Status: DC | PRN
  Administered 2011-08-11: 40 mg via INTRAVENOUS

## 2011-08-11 MED ORDER — STERILE WATER FOR IRRIGATION IR SOLN
Status: DC | PRN
  Administered 2011-08-11: 1 via INTRAVESICAL

## 2011-08-11 MED ORDER — ACETAMINOPHEN 10 MG/ML IV SOLN
1000.0000 mg | Freq: Four times a day (QID) | INTRAVENOUS | Status: DC
  Administered 2011-08-11: 1000 mg via INTRAVENOUS
  Filled 2011-08-11 (×4): qty 100

## 2011-08-11 MED ORDER — BUPIVACAINE HCL (PF) 0.5 % IJ SOLN
INTRAMUSCULAR | Status: DC | PRN
  Administered 2011-08-11: 28 mL

## 2011-08-11 MED ORDER — GLYCOPYRROLATE 0.2 MG/ML IJ SOLN
INTRAMUSCULAR | Status: DC | PRN
  Administered 2011-08-11: 0.2 mg via INTRAVENOUS
  Administered 2011-08-11: 0.4 mg via INTRAVENOUS

## 2011-08-11 MED ORDER — ROPIVACAINE HCL 5 MG/ML IJ SOLN
INTRAMUSCULAR | Status: AC
  Filled 2011-08-11: qty 60

## 2011-08-11 MED ORDER — INDIGOTINDISULFONATE SODIUM 8 MG/ML IJ SOLN
INTRAMUSCULAR | Status: AC
  Filled 2011-08-11: qty 5

## 2011-08-11 MED ORDER — CEFAZOLIN SODIUM-DEXTROSE 2-3 GM-% IV SOLR
INTRAVENOUS | Status: AC
  Filled 2011-08-11: qty 50

## 2011-08-11 MED ORDER — IBUPROFEN 800 MG PO TABS: 800.0000 mg | ORAL_TABLET | Freq: Three times a day (TID) | ORAL | Status: AC | PRN

## 2011-08-11 MED ORDER — LIDOCAINE HCL (CARDIAC) 20 MG/ML IV SOLN
INTRAVENOUS | Status: AC
  Filled 2011-08-11: qty 5

## 2011-08-11 MED ORDER — ONDANSETRON HCL 4 MG/2ML IJ SOLN
INTRAMUSCULAR | Status: AC
  Filled 2011-08-11: qty 2

## 2011-08-11 MED ORDER — EPHEDRINE 5 MG/ML INJ
INTRAVENOUS | Status: AC
  Filled 2011-08-11: qty 10

## 2011-08-11 MED ORDER — BUPIVACAINE HCL (PF) 0.5 % IJ SOLN
INTRAMUSCULAR | Status: AC
  Filled 2011-08-11: qty 30

## 2011-08-11 MED ORDER — FENTANYL CITRATE 0.05 MG/ML IJ SOLN
INTRAMUSCULAR | Status: AC
  Filled 2011-08-11: qty 5

## 2011-08-11 MED ORDER — PHENYLEPHRINE HCL 10 MG/ML IJ SOLN
INTRAMUSCULAR | Status: DC | PRN
  Administered 2011-08-11: 80 mg via INTRAVENOUS
  Administered 2011-08-11: 120 mg via INTRAVENOUS

## 2011-08-11 MED ORDER — GLYCOPYRROLATE 0.2 MG/ML IJ SOLN
INTRAMUSCULAR | Status: AC
  Filled 2011-08-11: qty 2

## 2011-08-11 MED ORDER — HYDROCODONE-ACETAMINOPHEN 5-325 MG PO TABS
1.0000 | ORAL_TABLET | ORAL | Status: DC | PRN
  Administered 2011-08-11: 1 via ORAL
  Filled 2011-08-11: qty 1

## 2011-08-11 MED ORDER — PROPOFOL 10 MG/ML IV EMUL
INTRAVENOUS | Status: DC | PRN
  Administered 2011-08-11: 150 mg via INTRAVENOUS

## 2011-08-11 MED ORDER — FLAVOXATE HCL 100 MG PO TABS: 100.0000 mg | ORAL_TABLET | Freq: Three times a day (TID) | ORAL | Status: DC | PRN

## 2011-08-11 MED ORDER — METOCLOPRAMIDE HCL 5 MG/ML IJ SOLN: 10.0000 mg | Freq: Once | INTRAMUSCULAR | Status: DC | PRN

## 2011-08-11 MED ORDER — NEOSTIGMINE METHYLSULFATE 1 MG/ML IJ SOLN
INTRAMUSCULAR | Status: DC | PRN
  Administered 2011-08-11: 2.5 mg via INTRAVENOUS

## 2011-08-11 MED ORDER — HYDROCODONE-ACETAMINOPHEN 5-325 MG PO TABS: 1.0000 | ORAL_TABLET | Freq: Four times a day (QID) | ORAL | Status: AC | PRN

## 2011-08-11 MED ORDER — ROPIVACAINE HCL 5 MG/ML IJ SOLN
INTRAMUSCULAR | Status: DC | PRN
  Administered 2011-08-11: 30 mL

## 2011-08-11 MED ORDER — FENTANYL CITRATE 0.05 MG/ML IJ SOLN
INTRAMUSCULAR | Status: AC
  Filled 2011-08-11: qty 2

## 2011-08-11 MED ORDER — ROCURONIUM BROMIDE 50 MG/5ML IV SOLN
INTRAVENOUS | Status: AC
  Filled 2011-08-11: qty 1

## 2011-08-11 MED ORDER — FENTANYL CITRATE 0.05 MG/ML IJ SOLN
25.0000 ug | INTRAMUSCULAR | Status: DC | PRN
  Administered 2011-08-11: 50 ug via INTRAVENOUS

## 2011-08-11 MED ORDER — SODIUM CHLORIDE 0.9 % IJ SOLN
INTRAMUSCULAR | Status: DC | PRN
  Administered 2011-08-11: 30 mL

## 2011-08-11 MED ORDER — PROMETHAZINE HCL 25 MG/ML IJ SOLN: 25.0000 mg | Freq: Four times a day (QID) | INTRAMUSCULAR | Status: DC | PRN

## 2011-08-11 MED ORDER — IBUPROFEN 800 MG PO TABS: 800.0000 mg | ORAL_TABLET | Freq: Three times a day (TID) | ORAL | Status: DC | PRN

## 2011-08-11 MED ORDER — FENTANYL CITRATE 0.05 MG/ML IJ SOLN
INTRAMUSCULAR | Status: DC | PRN
  Administered 2011-08-11: 150 ug via INTRAVENOUS
  Administered 2011-08-11 (×2): 100 ug via INTRAVENOUS
  Administered 2011-08-11: 150 ug via INTRAVENOUS

## 2011-08-11 MED ORDER — NEOSTIGMINE METHYLSULFATE 1 MG/ML IJ SOLN
INTRAMUSCULAR | Status: AC
  Filled 2011-08-11: qty 10

## 2011-08-11 MED ORDER — LACTATED RINGERS IR SOLN
Status: DC | PRN
  Administered 2011-08-11: 1

## 2011-08-11 MED ORDER — MIDAZOLAM HCL 5 MG/5ML IJ SOLN
INTRAMUSCULAR | Status: DC | PRN
  Administered 2011-08-11: 2 mg via INTRAVENOUS

## 2011-08-11 MED ORDER — LACTATED RINGERS IV SOLN
INTRAVENOUS | Status: DC
  Administered 2011-08-11: 07:00:00 via INTRAVENOUS

## 2011-08-11 MED ORDER — MIDAZOLAM HCL 2 MG/2ML IJ SOLN
INTRAMUSCULAR | Status: AC
  Filled 2011-08-11: qty 2

## 2011-08-11 MED ORDER — HYDROCODONE-ACETAMINOPHEN 5-325 MG PO TABS: 1.0000 | ORAL_TABLET | ORAL | Status: DC | PRN

## 2011-08-11 MED ORDER — MEPERIDINE HCL 25 MG/ML IJ SOLN: 6.2500 mg | INTRAMUSCULAR | Status: DC | PRN

## 2011-08-11 MED ORDER — ONDANSETRON HCL 4 MG/2ML IJ SOLN
INTRAMUSCULAR | Status: DC | PRN
  Administered 2011-08-11: 4 mg via INTRAVENOUS

## 2011-08-11 MED ORDER — PROPOFOL 10 MG/ML IV EMUL
INTRAVENOUS | Status: AC
  Filled 2011-08-11: qty 20

## 2011-08-11 SURGICAL SUPPLY — 85 items
ADH SKN CLS APL DERMABOND .7 (GAUZE/BANDAGES/DRESSINGS) ×2
BAG URINE DRAINAGE (UROLOGICAL SUPPLIES) ×3 IMPLANT
BLADE SURG 11 STRL SS (BLADE) ×4 IMPLANT
BLADE SURG 15 STRL LF C SS BP (BLADE) ×2 IMPLANT
BLADE SURG 15 STRL SS (BLADE) ×3
CABLE HIGH FREQUENCY MONO STRZ (ELECTRODE) ×3 IMPLANT
CANISTER SUCTION 2500CC (MISCELLANEOUS) ×3 IMPLANT
CATH FOLEY 2WAY SLVR 30CC 16FR (CATHETERS) ×1 IMPLANT
CATH FOLEY 3WAY  5CC 16FR (CATHETERS) ×1
CATH FOLEY 3WAY 5CC 16FR (CATHETERS) ×2 IMPLANT
CHLORAPREP W/TINT 26ML (MISCELLANEOUS) ×3 IMPLANT
CLOTH BEACON ORANGE TIMEOUT ST (SAFETY) ×3 IMPLANT
CONT PATH 16OZ SNAP LID 3702 (MISCELLANEOUS) ×3 IMPLANT
COVER MAYO STAND STRL (DRAPES) ×3 IMPLANT
COVER TABLE BACK 60X90 (DRAPES) ×6 IMPLANT
COVER TIP SHEARS 8 DVNC (MISCELLANEOUS) ×2 IMPLANT
COVER TIP SHEARS 8MM DA VINCI (MISCELLANEOUS) ×1
DECANTER SPIKE VIAL GLASS SM (MISCELLANEOUS) ×4 IMPLANT
DERMABOND ADVANCED (GAUZE/BANDAGES/DRESSINGS) ×1
DERMABOND ADVANCED .7 DNX12 (GAUZE/BANDAGES/DRESSINGS) ×2 IMPLANT
DRAPE HUG U DISPOSABLE (DRAPE) ×3 IMPLANT
DRAPE HYSTEROSCOPY (DRAPE) ×3 IMPLANT
DRAPE LG THREE QUARTER DISP (DRAPES) ×6 IMPLANT
DRAPE MONITOR DA VINCI (DRAPE) ×1 IMPLANT
DRAPE WARM FLUID 44X44 (DRAPE) ×3 IMPLANT
ELECT REM PT RETURN 9FT ADLT (ELECTROSURGICAL) ×3
ELECTRODE REM PT RTRN 9FT ADLT (ELECTROSURGICAL) ×2 IMPLANT
EVACUATOR SMOKE 8.L (FILTER) ×3 IMPLANT
GLOVE BIO SURGEON STRL SZ 6.5 (GLOVE) ×9 IMPLANT
GLOVE BIO SURGEON STRL SZ7 (GLOVE) ×1 IMPLANT
GLOVE BIOGEL PI IND STRL 6.5 (GLOVE) ×2 IMPLANT
GLOVE BIOGEL PI IND STRL 7.0 (GLOVE) IMPLANT
GLOVE BIOGEL PI INDICATOR 6.5 (GLOVE) ×1
GLOVE BIOGEL PI INDICATOR 7.0 (GLOVE) ×1
GLOVE ECLIPSE 6.5 STRL STRAW (GLOVE) ×9 IMPLANT
GOWN PREVENTION PLUS LG XLONG (DISPOSABLE) ×12 IMPLANT
GOWN STRL REIN XL XLG (GOWN DISPOSABLE) ×18 IMPLANT
HEMOSTAT SURGICEL 2X3 (HEMOSTASIS) IMPLANT
KIT ACCESSORY DA VINCI DISP (KITS) ×1
KIT ACCESSORY DVNC DISP (KITS) ×2 IMPLANT
KIT DISP ACCESSORY 4 ARM (KITS) IMPLANT
MANIPULATOR UTERINE 4.5 ZUMI (MISCELLANEOUS) IMPLANT
NDL HYPO 25X1 1.5 SAFETY (NEEDLE) IMPLANT
NDL INSUFFLATION 14GA 120MM (NEEDLE) ×2 IMPLANT
NDL SPNL 18GX3.5 QUINCKE PK (NEEDLE) ×2 IMPLANT
NEEDLE HYPO 25X1 1.5 SAFETY (NEEDLE) ×6 IMPLANT
NEEDLE INSUFFLATION 14GA 120MM (NEEDLE) ×3 IMPLANT
NEEDLE SPNL 18GX3.5 QUINCKE PK (NEEDLE) ×3 IMPLANT
NS IRRIG 1000ML POUR BTL (IV SOLUTION) ×3 IMPLANT
OCCLUDER COLPOPNEUMO (BALLOONS) ×6 IMPLANT
PACK LAVH (CUSTOM PROCEDURE TRAY) ×3 IMPLANT
PACK VAGINAL WOMENS (CUSTOM PROCEDURE TRAY) ×3 IMPLANT
PAD PREP 24X48 CUFFED NSTRL (MISCELLANEOUS) ×6 IMPLANT
PLUG CATH AND CAP STER (CATHETERS) ×3 IMPLANT
PROTECTOR NERVE ULNAR (MISCELLANEOUS) ×6 IMPLANT
SET CYSTO W/LG BORE CLAMP LF (SET/KITS/TRAYS/PACK) ×3 IMPLANT
SET IRRIG TUBING LAPAROSCOPIC (IRRIGATION / IRRIGATOR) ×3 IMPLANT
SLING ADVANTAGE TRANSVAGINAL (Sling) ×1 IMPLANT
SOLUTION ELECTROLUBE (MISCELLANEOUS) ×3 IMPLANT
STRIP CLOSURE SKIN 1/2X4 (GAUZE/BANDAGES/DRESSINGS) ×3 IMPLANT
SUT VIC AB 0 CT1 27 (SUTURE) ×6
SUT VIC AB 0 CT1 27XBRD ANBCTR (SUTURE) ×10 IMPLANT
SUT VIC AB 0 CT1 27XBRD ANTBC (SUTURE) IMPLANT
SUT VIC AB 2-0 CT2 27 (SUTURE) IMPLANT
SUT VIC AB 2-0 SH 27 (SUTURE) ×6
SUT VIC AB 2-0 SH 27XBRD (SUTURE) ×4 IMPLANT
SUT VIC AB 4-0 PS2 18 (SUTURE) ×1 IMPLANT
SUT VIC AB 4-0 SH 27 (SUTURE)
SUT VIC AB 4-0 SH 27XANBCTRL (SUTURE) IMPLANT
SUT VICRYL 0 UR6 27IN ABS (SUTURE) ×6 IMPLANT
SUT VICRYL RAPIDE 4/0 PS 2 (SUTURE) ×6 IMPLANT
SUT VLOC 180 0 9IN  GS21 (SUTURE) ×1
SUT VLOC 180 0 9IN GS21 (SUTURE) IMPLANT
SYR 50ML LL SCALE MARK (SYRINGE) ×3 IMPLANT
SYR CONTROL 10ML LL (SYRINGE) ×2 IMPLANT
SYSTEM CONVERTIBLE TROCAR (TROCAR) IMPLANT
TIP UTERINE 6.7X8CM BLUE DISP (MISCELLANEOUS) ×1 IMPLANT
TOWEL OR 17X24 6PK STRL BLUE (TOWEL DISPOSABLE) ×6 IMPLANT
TRAY FOLEY CATH 14FR (SET/KITS/TRAYS/PACK) ×3 IMPLANT
TROCAR DISP BLADELESS 8 DVNC (TROCAR) ×2 IMPLANT
TROCAR DISP BLADELESS 8MM (TROCAR) ×1
TROCAR XCEL 12X100 BLDLESS (ENDOMECHANICALS) ×3 IMPLANT
TROCAR Z-THREAD 12X150 (TROCAR) ×3 IMPLANT
TUBING FILTER THERMOFLATOR (ELECTROSURGICAL) ×3 IMPLANT
WATER STERILE IRR 1000ML POUR (IV SOLUTION) ×9 IMPLANT

## 2011-08-11 NOTE — Anesthesia Postprocedure Evaluation (Signed)
Anesthesia Post Note  Patient: Kristen Moreno  Procedure(s) Performed: Procedure(s) (LRB): ROBOTIC ASSISTED TOTAL HYSTERECTOMY WITH BILATERAL SALPINGO OOPHERECTOMY (N/A) TRANSVAGINAL TAPE (TVT) PROCEDURE (N/A)  Anesthesia type: General  Patient location: PACU  Post pain: Pain level controlled  Post assessment: Post-op Vital signs reviewed  Last Vitals:  Filed Vitals:   08/11/11 1145  BP: 128/82  Pulse: 75  Temp: 36.4 C  Resp: 16    Post vital signs: Reviewed  Level of consciousness: sedated  Complications: No apparent anesthesia complications

## 2011-08-11 NOTE — Anesthesia Procedure Notes (Signed)
Procedure Name: Intubation Date/Time: 08/11/2011 7:39 AM Performed by: Jonna Munro Pre-anesthesia Checklist: Suction available, Emergency Drugs available, Timeout performed, Patient identified and Patient being monitored Patient Re-evaluated:Patient Re-evaluated prior to inductionOxygen Delivery Method: Circle system utilized Preoxygenation: Pre-oxygenation with 100% oxygen Intubation Type: IV induction Ventilation: Mask ventilation without difficulty Laryngoscope Size: Mac and 3 Grade View: Grade II Tube type: Oral Number of attempts: 1 Airway Equipment and Method: Stylet Placement Confirmation: ETT inserted through vocal cords under direct vision,  positive ETCO2 and breath sounds checked- equal and bilateral Secured at: 22 cm Tube secured with: Tape Dental Injury: Teeth and Oropharynx as per pre-operative assessment

## 2011-08-11 NOTE — Transfer of Care (Signed)
Immediate Anesthesia Transfer of Care Note  Patient: Kristen Moreno  Procedure(s) Performed: Procedure(s) (LRB): ROBOTIC ASSISTED TOTAL HYSTERECTOMY WITH BILATERAL SALPINGO OOPHERECTOMY (N/A) TRANSVAGINAL TAPE (TVT) PROCEDURE (N/A)  Patient Location: PACU  Anesthesia Type: General  Level of Consciousness: awake, alert  and oriented  Airway & Oxygen Therapy: Patient Spontanous Breathing and Patient connected to nasal cannula oxygen  Post-op Assessment: Report given to PACU RN and Post -op Vital signs reviewed and stable  Post vital signs: Reviewed and stable  Complications: No apparent anesthesia complications

## 2011-08-11 NOTE — H&P (Signed)
  HPI  51 S AA P2 ( 10 and 51 yo) who is here today because she was told in the ER that she needs a hysterectomy. She complains of a 8 month h/o pelvic pain/RLQ pain. She says it is worse when she is standing at work. She hasn't had sex in about 2 months but denies h/o dysparunia. She went through menopause at age 67. An u/s 4/13 showed an enlarged uterus with multiple fibroids, actually growing since previous u/s.  She also complains of GSUI for about a year.   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 She does not want to delay surgery.  FH- no breast, gyn, colon ca  PE- [place] BF NAD  Heart- rrr Lungs- CTAB Abd- benign  A/P. CPP, probably due to enlarging fibroids- We have discussed options and she opts for RATH/BSO. She understands and accepts the risks of surgery including but not limited to infection, bleeding, damage to bowel, bladder, ureters, DVT/PE. She also understands the risk of going home with a catheter and risk of DI (5%) after sling.

## 2011-08-11 NOTE — Anesthesia Postprocedure Evaluation (Signed)
  Anesthesia Post-op Note  Patient: Kristen Moreno  Procedure(s) Performed: Procedure(s) (LRB): ROBOTIC ASSISTED TOTAL HYSTERECTOMY WITH BILATERAL SALPINGO OOPHERECTOMY (N/A) TRANSVAGINAL TAPE (TVT) PROCEDURE (N/A)  Patient Location: [facility] Unit  Anesthesia Type: General  Level of Consciousness: sedated and patient cooperative  Airway and Oxygen Therapy: Patient Spontanous Breathing and Patient connected to nasal cannula oxygen  Post-op Pain: none  Post-op Assessment: Patient's Cardiovascular Status Stable, Respiratory Function Stable, Patent Airway, No signs of Nausea or vomiting, Adequate PO intake and Pain level controlled  Post-op Vital Signs: Reviewed and stable  Complications: No apparent anesthesia complications

## 2011-08-11 NOTE — Op Note (Signed)
08/11/2011  9:45 AM  PATIENT:  Kristen Moreno  51 y.o. female  PRE-OPERATIVE DIAGNOSIS:  Symptoatic Fibroids in Uterus, Chronic Pelvic Pain, and Stress Urinary Incontinence  POST-OPERATIVE DIAGNOSIS:  Symptoatic Fibroids in Uterus, Chronic Pelvic Pain, and Stress Urinary Incontinence  PROCEDURE:  Procedure(s) (LRB): ROBOTIC ASSISTED TOTAL HYSTERECTOMY WITH BILATERAL SALPINGO OOPHERECTOMY (N/A) TRANSVAGINAL TAPE (TVT) PROCEDURE (N/A) CYSTOSCOPY  SURGEON:  Surgeon(s) and Role:    * Emily Filbert, MD - Primary     PHYSICIAN ASSISTANT:   ASSISTANTS: Silas Sacramento, MD ANESTHESIA:   general  EBL:  Total I/O In: 800 [I.V.:800] Out: 500 [Urine:300; Blood:200]  BLOOD ADMINISTERED:none  DRAINS: none   LOCAL MEDICATIONS USED:  OTHER ropivicaine  SPECIMEN:  Source of Specimen:  uterus, tubes, ovaries  DISPOSITION OF SPECIMEN:  PATHOLOGY  COUNTS:  YES  TOURNIQUET:  * No tourniquets in log *  DICTATION: .Dragon Dictation  PLAN OF CARE: outpatient with extended recovery  PATIENT DISPOSITION:  PACU - hemodynamically stable.   Delay start of Pharmacological VTE agent (>24hrs) due to surgical blood loss or risk of bleeding: not applicable   The risks, benefits, and alternatives of surgery were explained, understood, accepted. All questions were answered and consents were signed. I specifically told her that robotic approach has an increased incidence of vaginal cuff dehiscence.I also quoted her normal risks associated with the surgery including, but not limited to, infection, bleeding, damage to bowel, bladder, ureters. In the operating room she was placed in the dorsal lithotomy position and general anesthesia was applied without complication. Her abdomen and vagina were prepped and draped in the usual sterile fashion. After extensive careful tucking measures were performed, a 8 cm Rumi uterine manipulator was placed. Please note that the uterus sounded to 9cm. It was sewn into  place at the cervix. I did a paravaginal cuff block with ropivacaine (10 cc). I placed a Foley catheter, and it drained clear urine throughout the case. Please note that prior to starting the case we did a timeout and I did a bimanual exam which revealed a 12 week size mobile uterus and nonenlarged adnexa. Gloves were changed and I turned my attention to the abdomen. A transverse umbilical incision was made and a Veries needle was placed intraperitoneally. Low-flow CO2 was used to insufflate the abdomen to approximately 4 L. Patient abdominal pressure was always less than 15. Laparoscopy confirmed correct placement. She was placed in steep Trendelenburg position. The pelvis was inspected the uterus was enlarged consistent with  fibroids seen on ultrasound. Her adnexa appeared normal there were no adhesions or evidence of endometriosis. Her ovaries appeared moderately atrophic.we then placed a 12 mm system port in the right lower quadrant. I placed two 8 mm ports approximately 12 cm lateral to the umbilicus. I also placed a 8 mm port in the LLQ. Prior to making the incisions each incision site was injected with 10 mL of dilute ropivacaine. The ports were placed in the robot was docked. I then proceeded to the robotic console. The ureters' positions were were noted bilaterally throughout the case. The infundibulopelvic ligaments were identified bilaterally and cauterized using the PK device. The round ligaments were also identified and ligated with the PK device. A bladder flap was created anteriorly, taking care to avoid damage to the bladder. An anterior colpotomy was made. This incision was carried around circumferentially. Please note that prior to the colpotomy incision I ligated the uterine vessels bilaterally. The uterus was then removed through the vagina. The  vaginal cuff was closed with a 0 vicryl V-lock suture. I removed the ovaries and tubes using the PK device. Excellent hemostasis was noted. Excellent  hemostasis was noted at all pedicles and the vaginal cuff. Both ureters were noted to be functioning normally and of normal caliber. The robot was undocked.We then closed the 12 mm system port site with a Endoloop closure. The fascia at the supraumbilical camera port incision was closed with 0 Vicryl and the subcutaneous tissue at all sites was closed with Dermabond. I then proceeded to do the placement of the Advantage Fit mid urethral sling. I injected 0.5% marcaine in the vaginal mucosa about 1 cm below the urethral meatus and behind the symphysis pubis about 1.5 cm lateral to the midline on each side. I used a bladder stylus to divert the bladder out of the way of the sling. The sling was placed on each side. Cystoscopy showed no evidence of damage to the bladder, and blue dye (from injected indigo carmine) was seen from each ureteral opening. The sling was elevated with a dilator placed between the sling and the urethra. The sling mesh was cut at the skin edges and the skin was closed with Dermabond. I drained her bladder. She was extubated without difficulty.The instrument, sponge, and needle counts were correct. She tolerated the procedure well. She was extubated and taken to recovery in stable condition.

## 2011-08-11 NOTE — Anesthesia Preprocedure Evaluation (Addendum)
Anesthesia Evaluation  Patient identified by MRN, date of birth, ID band Patient awake    Reviewed: Allergy & Precautions, H&P , NPO status , Patient's Chart, lab work & pertinent test results  Airway Mallampati: II TM Distance: >3 FB Neck ROM: Full    Dental No notable dental hx. (+) Teeth Intact   Pulmonary neg pulmonary ROS, pneumonia -, resolved, Recent URI , Resolved,  Was diagnosed with Lower Respiratory infection last Tue. Cough has pretty much resolved. Took Azithromycin for 5 days last dose 7/19.  breath sounds clear to auscultation  Pulmonary exam normal       Cardiovascular hypertension, Pt. on medications Rhythm:Regular Rate:Normal     Neuro/Psych  Headaches, Depression    GI/Hepatic negative GI ROS, Neg liver ROS,   Endo/Other  negative endocrine ROS  Renal/GU negative Renal ROS Bladder dysfunction  SUI    Musculoskeletal negative musculoskeletal ROS (+)   Abdominal   Peds  Hematology negative hematology ROS (+)   Anesthesia Other Findings   Reproductive/Obstetrics Uterine Fibroids                        Anesthesia Physical Anesthesia Plan  ASA: II  Anesthesia Plan: General   Post-op Pain Management:    Induction: Intravenous  Airway Management Planned: Oral ETT  Additional Equipment:   Intra-op Plan:   Post-operative Plan: Extubation in OR  Informed Consent: I have reviewed the patients History and Physical, chart, labs and discussed the procedure including the risks, benefits and alternatives for the proposed anesthesia with the patient or authorized representative who has indicated his/her understanding and acceptance.   Dental advisory given  Plan Discussed with: CRNA, Anesthesiologist and Surgeon  Anesthesia Plan Comments:        discussed increased risk of pulmonary complications extensively and patient understands and wishes to proceed as she has taken  off work Anesthesia Quick Evaluation

## 2011-08-12 NOTE — Discharge Summary (Signed)
Physician Discharge Summary  Patient ID: Kristen Moreno MRN: [id number] DOB/AGE: 26-Dec-1960 51 y.o.  Admit date: 08/11/2011 Discharge date: 08/12/2011  Admission Diagnoses: Chronic pelvic pain, GSUI  Discharge Diagnoses: same  Hospital Course: She underwent an uncomplicated RATH/BSO/Advantage Fit mid urethral sling and cystoscopy without complication. She was unable to void and required placement of a Foley catheter. By the early evening she was tolerating po and ambulating well. She felt ready to go home. She will come back to the office in 2 days for a voiding trial or prn sooner.  Discharge Exam: Blood pressure 144/93, pulse 74, temperature 98.3 F (36.8 C), temperature source Oral, resp. rate 14, SpO2 100.00%.   Disposition: 01-Home or Self Care    Medication List  As of 08/12/2011 10:09 AM   TAKE these medications         amLODipine 10 MG tablet   Commonly known as: NORVASC   Take 10 mg by mouth daily.      azithromycin 250 MG tablet   Commonly known as: ZITHROMAX   Take 1 tablet daily starting Thursday, July 18.      BC HEADACHE POWDER PO   Take 1 packet by mouth daily as needed. For headache      flavoxATE 100 MG tablet   Commonly known as: URISPAS   Take 1 tablet (100 mg total) by mouth 3 (three) times daily as needed.      HYDROcodone-acetaminophen 5-325 MG per tablet   Commonly known as: NORCO/VICODIN   Take 1 tablet by mouth every 6 (six) hours as needed for pain.      ibuprofen 800 MG tablet   Commonly known as: ADVIL,MOTRIN   Take 1 tablet (800 mg total) by mouth every 8 (eight) hours as needed for pain.      mupirocin ointment 2 %   Commonly known as: BACTROBAN   Apply 1 application topically 2 (two) times daily. To each nostril             Signed: Davonn Flanery C. 08/12/2011, 10:09 AM

## 2011-08-12 NOTE — Progress Notes (Signed)
Discharge instructions given to and reviewed with patient.  Pt verbalized understanding of discharge instructions.2nd copy of discharge instructions signed and place in patient shadow chart. Pt escorted out by  Naida Sleight, nurse tech                      .

## 2011-08-13 ENCOUNTER — Encounter (HOSPITAL_COMMUNITY): Payer: Self-pay | Admitting: Obstetrics & Gynecology

## 2011-08-13 ENCOUNTER — Ambulatory Visit (INDEPENDENT_AMBULATORY_CARE_PROVIDER_SITE_OTHER): Payer: Self-pay | Admitting: *Deleted

## 2011-08-13 VITALS — BP 137/91 | HR 86 | Temp 97.7°F | Resp 20 | Ht 66.0 in | Wt 165.9 lb

## 2011-08-13 DIAGNOSIS — Z09 Encounter for follow-up examination after completed treatment for conditions other than malignant neoplasm: Secondary | ICD-10-CM

## 2011-08-13 DIAGNOSIS — Z9889 Other specified postprocedural states: Secondary | ICD-10-CM

## 2011-08-13 NOTE — Progress Notes (Signed)
Called Dr. Hulan Fray to notify patient here.  Per Dr.Dove patient was supposed to have taken catheter out at home this am, but she did not. Per Dr. Hulan Fray instructions explained to patient to go home today, then in the morning take out catheter and come to clinic at 1:00 for post void residual.. Explained to patient if she has to void before she comes to clinic to measure in container that nurse gave her today.  Also gave her 10 cc syringe and demonstrated and explained to patient how to take out cathether at home and call if any problems.  Patient voices understanding.

## 2011-08-14 ENCOUNTER — Ambulatory Visit (INDEPENDENT_AMBULATORY_CARE_PROVIDER_SITE_OTHER): Payer: Self-pay | Admitting: Obstetrics & Gynecology

## 2011-08-14 VITALS — BP 124/88 | HR 89 | Temp 97.9°F | Ht 66.0 in | Wt 168.0 lb

## 2011-08-14 DIAGNOSIS — R32 Unspecified urinary incontinence: Secondary | ICD-10-CM

## 2011-08-14 NOTE — Progress Notes (Signed)
Patient had surgery on 08/11/11.  Removed catheter this am.  Voided prior to bladder scanned which shows 26 ML of urine.  Patients abdomin is tender.  States she is taking her pain medication.  Wants to know when she can return to work.  Telephoned Dr. Hulan Fray.  Information shared.    Dr. Hulan Fray states patient can return to work anytime she feels ready within the next six weeks.  Explained to patient.  Patient states she does need a note for work and thinks she may want to return to work on Monday.  Encouraged patient to wait through the weekend and if she feels well enough to return to work on Monday to call us and we will fax a note to her employer.  Patient states understanding.

## 2011-09-10 ENCOUNTER — Ambulatory Visit (INDEPENDENT_AMBULATORY_CARE_PROVIDER_SITE_OTHER): Payer: Self-pay | Admitting: Obstetrics & Gynecology

## 2011-09-10 ENCOUNTER — Encounter: Payer: Self-pay | Admitting: Obstetrics & Gynecology

## 2011-09-10 VITALS — BP 113/78 | HR 98 | Temp 97.8°F | Ht 66.0 in | Wt 170.0 lb

## 2011-09-10 DIAGNOSIS — Z09 Encounter for follow-up examination after completed treatment for conditions other than malignant neoplasm: Secondary | ICD-10-CM

## 2011-09-10 DIAGNOSIS — Z9889 Other specified postprocedural states: Secondary | ICD-10-CM

## 2011-09-10 MED ORDER — ESTRADIOL 1 MG PO TABS: 1.0000 mg | ORAL_TABLET | Freq: Every day | ORAL | Status: DC

## 2011-09-10 NOTE — Progress Notes (Signed)
  Subjective:    Patient ID: Kristen Moreno, female    DOB: Dec 29, 1960, 51 y.o.   MRN: [id number]  HPI  Kristen Moreno is now 4 weeks status post RATH/BSO/TVT. Her only complaint is that of hot flashes. She would like to be treated with estrogen. She has not had sex yet and has no plans to do this in the near future (She is aware that I would recommend pelvic rest for another 3 to 4 weeks). She denies any GSUI since surgery. Review of Systems Mammogram normal recently    Objective:   Physical Exam Well healed incisions and vaginal cuff Normal bimanual exam       Assessment & Plan:  Post op doing well Prescibe estradiol 1 mg daily RTC 1 year/prn sooner

## 2011-11-09 ENCOUNTER — Emergency Department (HOSPITAL_COMMUNITY)
Admission: EM | Admit: 2011-11-09 | Discharge: 2011-11-10 | Disposition: A | Discharge status: Home / Self Care | Payer: Self-pay | Attending: Emergency Medicine | Admitting: Emergency Medicine

## 2011-11-09 ENCOUNTER — Encounter (HOSPITAL_COMMUNITY): Payer: Self-pay | Admitting: Emergency Medicine

## 2011-11-09 DIAGNOSIS — Z90710 Acquired absence of both cervix and uterus: Secondary | ICD-10-CM | POA: Insufficient documentation

## 2011-11-09 DIAGNOSIS — R102 Pelvic and perineal pain: Secondary | ICD-10-CM

## 2011-11-09 DIAGNOSIS — I10 Essential (primary) hypertension: Secondary | ICD-10-CM | POA: Insufficient documentation

## 2011-11-09 DIAGNOSIS — R10813 Right lower quadrant abdominal tenderness: Secondary | ICD-10-CM | POA: Insufficient documentation

## 2011-11-09 DIAGNOSIS — N949 Unspecified condition associated with female genital organs and menstrual cycle: Secondary | ICD-10-CM | POA: Insufficient documentation

## 2011-11-09 DIAGNOSIS — N898 Other specified noninflammatory disorders of vagina: Secondary | ICD-10-CM | POA: Insufficient documentation

## 2011-11-09 LAB — URINALYSIS, ROUTINE W REFLEX MICROSCOPIC
Bilirubin Urine: NEGATIVE
Glucose, UA: NEGATIVE mg/dL
Hgb urine dipstick: NEGATIVE
Ketones, ur: NEGATIVE mg/dL
Leukocytes, UA: NEGATIVE
Nitrite: NEGATIVE
Protein, ur: 100 mg/dL — AB
Specific Gravity, Urine: 1.016 (ref 1.005–1.030)
Urobilinogen, UA: 0.2 mg/dL (ref 0.0–1.0)
pH: 5.5 (ref 5.0–8.0)

## 2011-11-09 LAB — WET PREP, GENITAL
Trich, Wet Prep: NONE SEEN
Yeast Wet Prep HPF POC: NONE SEEN

## 2011-11-09 LAB — URINE MICROSCOPIC-ADD ON

## 2011-11-09 MED ORDER — NAPROXEN 250 MG PO TABS
500.0000 mg | ORAL_TABLET | Freq: Once | ORAL | Status: AC
  Administered 2011-11-09: 500 mg via ORAL
  Filled 2011-11-09: qty 2

## 2011-11-09 NOTE — ED Notes (Signed)
Pt c/o rlq Abd pain onset 2 weeks ago.  Had Hysterectomy and bladder lift 2 months ago. Hurting more tonight rating pain 4/10, intermittent increasing with movement.  State vaginal discharge thick and white  For 2 weeks.  Also states urinary frequency with odor to urine

## 2011-11-09 NOTE — ED Notes (Signed)
Pt reports that had hysterectomy 2 months, and was sent home with catheter which was removed 3 days later; pt reports have lower abd pain X 2 weeks, with some dysuria, pt also reports thick white vaginal discharge with odor

## 2011-11-10 LAB — GC/CHLAMYDIA PROBE AMP, GENITAL
Chlamydia, DNA Probe: NEGATIVE
GC Probe Amp, Genital: NEGATIVE

## 2011-11-10 MED ORDER — NAPROXEN 500 MG PO TABS: 500.0000 mg | ORAL_TABLET | Freq: Two times a day (BID) | ORAL | Status: DC

## 2011-11-10 NOTE — ED Provider Notes (Signed)
History     CSN: [id number]  Arrival date & time 11/09/11  2106   First MD Initiated Contact with Patient 11/09/11 2259      Chief Complaint  Patient presents with  . Vaginal Discharge    (Consider location/radiation/quality/duration/timing/severity/associated sxs/prior treatment) HPI 51 year old female presents to the emergency department with lower pelvic pain. Patient reports since having her hysterectomy 2 months ago she has had intermittent pain in her lower abdomen. Over the last 2 weeks his pain has gotten worse. She reports she's having some mild pain with urination. Patient also is noted thick white discharge. No change in sexual partners, no fever no chills. She has not taken anything for the pain. She reports the pain is worse after standing at work all day.  Past Medical History  Diagnosis Date  . Hypertension   . Polycystic kidney disease   . Ovarian cyst   . Polycystic disease, ovaries   . Depression     no meds    Past Surgical History  Procedure Date  . No past surgeries   . Bladder suspension 08/11/2011    Procedure: TRANSVAGINAL TAPE (TVT) PROCEDURE;  Surgeon: Emily Filbert, MD;  Location: [facility];  Service: Gynecology;  Laterality: N/A;  Add:  Cystoscopy  . Abdominal hysterectomy     Family History  Problem Relation Age of Onset  . Asthma Mother   . Hypertension Mother   . Asthma Father   . Hypertension Father     History  Substance Use Topics  . Smoking status: Current Every Day Smoker -- 0.3 packs/day for .5 years    Types: Cigarettes  . Smokeless tobacco: Former Systems developer    Quit date: 07/31/2011  . Alcohol Use: No    OB History    Grav Para Term Preterm Abortions TAB SAB Ect Mult Living   5 2 2  3 2 1   2       Review of Systems  All other systems reviewed and are negative.    Allergies  Ace inhibitors  Home Medications   Current Outpatient Rx  Name Route Sig Dispense Refill  . AMLODIPINE BESYLATE 10 MG PO TABS Oral Take 10 mg by  mouth daily.    . BC HEADACHE POWDER PO Oral Take 1 packet by mouth daily as needed. For headache    . NAPROXEN 500 MG PO TABS Oral Take 1 tablet (500 mg total) by mouth 2 (two) times daily with a meal. 30 tablet 0    BP 136/94  Pulse 81  Temp 98.2 F (36.8 C) (Oral)  Resp 18  SpO2 100%  Physical Exam  Nursing note and vitals reviewed. Abdominal: Soft. Bowel sounds are normal. She exhibits no distension and no mass. There is Tenderness: Mild tenderness in right lower quadrant without rebound or guarding.. There is no rebound and no guarding.  Genitourinary: Vaginal discharge (scant white discharge without odor) found.       Patient with hysterectomy, tenderness on bimanual exam in the right adnexal area, unable to palpate ovaries suspect surgically removed no masses noted  Musculoskeletal: Normal range of motion. She exhibits no edema and no tenderness.    ED Course  Procedures (including critical care time)  Labs Reviewed  URINALYSIS, ROUTINE W REFLEX MICROSCOPIC - Abnormal; Notable for the following:    APPearance CLOUDY (*)     Protein, ur 100 (*)     All other components within normal limits  WET PREP, GENITAL - Abnormal; Notable for the  following:    Clue Cells Wet Prep HPF POC FEW (*)     WBC, Wet Prep HPF POC FEW (*)     All other components within normal limits  URINE MICROSCOPIC-ADD ON  GC/CHLAMYDIA PROBE AMP, GENITAL   No results found.   1. Pelvic pain in female       MDM  51 year old female with pelvic pain status post hysterectomy. Pain may be due to adhesions. Will start with NSAID and followup with her gynecologist.        Kalman Drape, MD 11/10/11 [id number]

## 2011-11-17 ENCOUNTER — Inpatient Hospital Stay (HOSPITAL_COMMUNITY)
Admission: AD | Admit: 2011-11-17 | Discharge: 2011-11-17 | Disposition: A | Discharge status: Home / Self Care | Payer: Self-pay | Source: Ambulatory Visit | Attending: Obstetrics & Gynecology | Admitting: Obstetrics & Gynecology

## 2011-11-17 ENCOUNTER — Encounter (HOSPITAL_COMMUNITY): Payer: Self-pay | Admitting: *Deleted

## 2011-11-17 DIAGNOSIS — N949 Unspecified condition associated with female genital organs and menstrual cycle: Secondary | ICD-10-CM | POA: Insufficient documentation

## 2011-11-17 DIAGNOSIS — B9620 Unspecified Escherichia coli [E. coli] as the cause of diseases classified elsewhere: Secondary | ICD-10-CM | POA: Diagnosis present

## 2011-11-17 DIAGNOSIS — M545 Low back pain, unspecified: Secondary | ICD-10-CM | POA: Insufficient documentation

## 2011-11-17 DIAGNOSIS — B952 Enterococcus as the cause of diseases classified elsewhere: Secondary | ICD-10-CM | POA: Diagnosis present

## 2011-11-17 DIAGNOSIS — N390 Urinary tract infection, site not specified: Secondary | ICD-10-CM | POA: Diagnosis present

## 2011-11-17 HISTORY — DX: Gastro-esophageal reflux disease without esophagitis: K21.9

## 2011-11-17 LAB — CBC
HCT: 39.2 % (ref 36.0–46.0)
Hemoglobin: 13.1 g/dL (ref 12.0–15.0)
MCH: 31.3 pg (ref 26.0–34.0)
MCHC: 33.4 g/dL (ref 30.0–36.0)
MCV: 93.8 fL (ref 78.0–100.0)
Platelets: 298 10*3/uL (ref 150–400)
RBC: 4.18 MIL/uL (ref 3.87–5.11)
RDW: 14 % (ref 11.5–15.5)
WBC: 11.6 10*3/uL — ABNORMAL HIGH (ref 4.0–10.5)

## 2011-11-17 LAB — URINALYSIS, ROUTINE W REFLEX MICROSCOPIC
Bilirubin Urine: NEGATIVE
Glucose, UA: NEGATIVE mg/dL
Ketones, ur: NEGATIVE mg/dL
Nitrite: POSITIVE — AB
Protein, ur: 100 mg/dL — AB
Specific Gravity, Urine: 1.025 (ref 1.005–1.030)
Urobilinogen, UA: 0.2 mg/dL (ref 0.0–1.0)
pH: 6 (ref 5.0–8.0)

## 2011-11-17 LAB — WET PREP, GENITAL
Clue Cells Wet Prep HPF POC: NONE SEEN
Trich, Wet Prep: NONE SEEN
Yeast Wet Prep HPF POC: NONE SEEN

## 2011-11-17 LAB — URINE MICROSCOPIC-ADD ON

## 2011-11-17 MED ORDER — CIPROFLOXACIN HCL 500 MG PO TABS: 500.0000 mg | ORAL_TABLET | Freq: Two times a day (BID) | ORAL | Status: DC

## 2011-11-17 MED ORDER — OXYCODONE-ACETAMINOPHEN 5-325 MG PO TABS: 1.0000 | ORAL_TABLET | Freq: Four times a day (QID) | ORAL | Status: DC | PRN

## 2011-11-17 MED ORDER — OXYCODONE-ACETAMINOPHEN 5-325 MG PO TABS
2.0000 | ORAL_TABLET | ORAL | Status: AC
  Administered 2011-11-17: 2 via ORAL
  Filled 2011-11-17: qty 2

## 2011-11-17 NOTE — MAU Note (Signed)
C/o abdominal pain from prior surgery; had hysterectomy and bladder tac on August 20th;

## 2011-11-17 NOTE — MAU Provider Note (Signed)
Chief Complaint: Post-op Problem   None    SUBJECTIVE HPI: Kristen Moreno is a 51 y.o. [id number] who presents to maternity admissions reporting vaginal pain and lower back pain, increasing when she urinates. She also has a clear discharge x1 week. She is 8 weeks postop from bladder tac and hysterectomy by Dr Hulan Fray.  She had normal postop visit in [facility] with Dr Hulan Fray and reports this pain started 3 days ago.  When asked about her ED visit at [facility] 7 days ago, she indicated that she has had pain for at least a week, but it worsened a few days ago.  Pt is vague about locations for pain, indicating it is on the right but when asked reports it is all over.  Back pain is diffuse, and pt has difficulty pointing to exact locations of pain.  She denies vaginal bleeding, vaginal itching/burning, urinary symptoms, h/a, dizziness, n/v, or fever/chills.    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  Past Medical History  Diagnosis Date  . Hypertension   . Polycystic kidney disease   . Ovarian cyst   . Polycystic disease, ovaries   . Depression     no meds  . GERD (gastroesophageal reflux disease)    Past Surgical History  Procedure Date  . No past surgeries   . Bladder suspension 08/11/2011    Procedure: TRANSVAGINAL TAPE (TVT) PROCEDURE;  Surgeon: Emily Filbert, MD;  Location: [facility];  Service: Gynecology;  Laterality: N/A;  Add:  Cystoscopy  . Abdominal hysterectomy    History   Social History  . Marital Status: Legally Separated    Spouse Name: N/A    Number of Children: N/A  . Years of Education: N/A   Occupational History  . Not on file.   Social History Main Topics  . Smoking status: Current Every Day Smoker -- 0.5 packs/day for .5 years    Types: Cigarettes  . Smokeless tobacco: Former Systems developer    Quit date: 07/31/2011  . Alcohol Use:  No  . Drug Use: No  . Sexually Active: No   Other Topics Concern  . Not on file   Social History Narrative  . No narrative on file   No current facility-administered medications on file prior to encounter.   Current Outpatient Prescriptions on File Prior to Encounter  Medication Sig Dispense Refill  . amLODipine (NORVASC) 10 MG tablet Take 10 mg by mouth daily.      . Aspirin-Salicylamide-Caffeine (BC HEADACHE POWDER PO) Take 1 packet by mouth daily as needed. For headache       Allergies  Allergen Reactions  . Ace Inhibitors Cough    Per [place] at [employer]    ROS: Pertinent items in HPI  OBJECTIVE Blood pressure 147/95, pulse 89, temperature 99 F (37.2 C), temperature source Oral, resp. rate 20, height 5\' 6"  (1.676 m), weight 79.833 kg (176 lb). GENERAL: Well-developed, well-nourished female in no acute distress.  HEENT: Normocephalic HEART: normal rate RESP: normal effort ABDOMEN: Soft, non-tender, no guarding, no rebound tenderness Mildly positive for R and L CVA tenderness, but tenderness diffuse over left and right lower back and flanks  EXTREMITIES: Nontender, no edema NEURO: Alert and oriented Pelvic exam: cervix surgically absent with pink, healthy tissue at vaginal cuff, scant white creamy discharge, vaginal walls and external genitalia normal  Bimanual exam: mild tenderness upon palpation to right and left lower quadrants, adnexa surgically absent, lower abdomen/pelvis soft, without palpable masses  LAB RESULTS Results for orders placed during the hospital encounter of 11/17/11 (from the past 24 hour(s))  URINALYSIS, ROUTINE W REFLEX MICROSCOPIC     Status: Abnormal   Collection Time   11/17/11  2:41 PM      Component Value Range   Color, Urine YELLOW  YELLOW   APPearance CLOUDY (*) CLEAR   Specific Gravity, Urine 1.025  1.005 - 1.030   pH 6.0  5.0 - 8.0   Glucose, UA NEGATIVE  NEGATIVE mg/dL   Hgb urine dipstick LARGE (*) NEGATIVE   Bilirubin Urine  NEGATIVE  NEGATIVE   Ketones, ur NEGATIVE  NEGATIVE mg/dL   Protein, ur 100 (*) NEGATIVE mg/dL   Urobilinogen, UA 0.2  0.0 - 1.0 mg/dL   Nitrite POSITIVE (*) NEGATIVE   Leukocytes, UA SMALL (*) NEGATIVE  URINE MICROSCOPIC-ADD ON     Status: Abnormal   Collection Time   11/17/11  2:41 PM      Component Value Range   Squamous Epithelial / LPF FEW (*) RARE   WBC, UA 21-50  <3 WBC/hpf   RBC / HPF 11-20  <3 RBC/hpf   Bacteria, UA MANY (*) RARE  WET PREP, GENITAL     Status: Abnormal   Collection Time   11/17/11  3:30 PM      Component Value Range   Yeast Wet Prep HPF POC NONE SEEN  NONE SEEN   Trich, Wet Prep NONE SEEN  NONE SEEN   Clue Cells Wet Prep HPF POC NONE SEEN  NONE SEEN   WBC, Wet Prep HPF POC FEW (*) NONE SEEN  CBC     Status: Abnormal   Collection Time   11/17/11  3:39 PM      Component Value Range   WBC 11.6 (*) 4.0 - 10.5 K/uL   RBC 4.18  3.87 - 5.11 MIL/uL   Hemoglobin 13.1  12.0 - 15.0 g/dL   HCT 39.2  36.0 - 46.0 %   MCV 93.8  78.0 - 100.0 fL   MCH 31.3  26.0 - 34.0 pg   MCHC 33.4  30.0 - 36.0 g/dL   RDW 14.0  11.5 - 15.5 %   Platelets 298  150 - 400 K/uL    ASSESSMENT 1. UTI (lower urinary tract infection)   2.  Possible pyelonephritis   PLAN Discharge home Cipro 500 mg BID x14 days per Dr Gala Romney Percocet 5/325, 1-2 tabs Q 6 hours PRN x15 tabs Drink plenty of fluids F/U with Gyn clinic or primary care provider as needed Return to MAU if symptoms persist or worsen    Medication List     As of 11/17/2011  4:35 PM    STOP taking these medications         BC HEADACHE POWDER PO      TAKE these medications         amLODipine 10 MG tablet   Commonly known as: NORVASC   Take 10 mg by mouth daily.      ciprofloxacin 500 MG tablet   Commonly known as: CIPRO   Take 1 tablet (500 mg total) by mouth 2 (two) times daily.      oxyCODONE-acetaminophen 5-325 MG per tablet   Commonly known as: PERCOCET/ROXICET   Take 1-2 tablets by mouth every 6  (six) hours as needed for pain.  Fatima Blank Certified Nurse-Midwife 11/17/2011  3:35 PM

## 2011-11-19 LAB — URINE CULTURE: Colony Count: >=100000

## 2011-11-21 NOTE — MAU Provider Note (Signed)
Medical Screening exam and patient care preformed by advanced practice provider.  Agree with the above management.  

## 2011-11-23 ENCOUNTER — Encounter (HOSPITAL_COMMUNITY): Payer: Self-pay | Admitting: *Deleted

## 2011-11-23 ENCOUNTER — Inpatient Hospital Stay (HOSPITAL_COMMUNITY)
Admission: AD | Admit: 2011-11-23 | Discharge: 2011-11-23 | Disposition: A | Discharge status: Home / Self Care | Payer: Self-pay | Source: Ambulatory Visit | Attending: Family Medicine | Admitting: Family Medicine

## 2011-11-23 ENCOUNTER — Inpatient Hospital Stay (HOSPITAL_COMMUNITY): Payer: Self-pay

## 2011-11-23 DIAGNOSIS — Q613 Polycystic kidney, unspecified: Secondary | ICD-10-CM

## 2011-11-23 DIAGNOSIS — N390 Urinary tract infection, site not specified: Secondary | ICD-10-CM | POA: Insufficient documentation

## 2011-11-23 DIAGNOSIS — M549 Dorsalgia, unspecified: Secondary | ICD-10-CM | POA: Insufficient documentation

## 2011-11-23 DIAGNOSIS — E282 Polycystic ovarian syndrome: Secondary | ICD-10-CM | POA: Insufficient documentation

## 2011-11-23 LAB — CBC
HCT: 38.6 % (ref 36.0–46.0)
Hemoglobin: 12.9 g/dL (ref 12.0–15.0)
MCH: 31.2 pg (ref 26.0–34.0)
MCHC: 33.4 g/dL (ref 30.0–36.0)
MCV: 93.2 fL (ref 78.0–100.0)
Platelets: 269 10*3/uL (ref 150–400)
RBC: 4.14 MIL/uL (ref 3.87–5.11)
RDW: 13.6 % (ref 11.5–15.5)
WBC: 9.1 10*3/uL (ref 4.0–10.5)

## 2011-11-23 LAB — URINALYSIS, ROUTINE W REFLEX MICROSCOPIC
Bilirubin Urine: NEGATIVE
Glucose, UA: NEGATIVE mg/dL
Ketones, ur: NEGATIVE mg/dL
Leukocytes, UA: NEGATIVE
Nitrite: NEGATIVE
Protein, ur: 100 mg/dL — AB
Specific Gravity, Urine: 1.025 (ref 1.005–1.030)
Urobilinogen, UA: 0.2 mg/dL (ref 0.0–1.0)
pH: 6 (ref 5.0–8.0)

## 2011-11-23 LAB — COMPREHENSIVE METABOLIC PANEL
ALT: 16 U/L (ref 0–35)
AST: 12 U/L (ref 0–37)
Albumin: 3.1 g/dL — ABNORMAL LOW (ref 3.5–5.2)
Alkaline Phosphatase: 69 U/L (ref 39–117)
BUN: 20 mg/dL (ref 6–23)
CO2: 25 mEq/L (ref 19–32)
Calcium: 9.4 mg/dL (ref 8.4–10.5)
Chloride: 104 mEq/L (ref 96–112)
Creatinine, Ser: 1.62 mg/dL — ABNORMAL HIGH (ref 0.50–1.10)
GFR calc Af Amer: 42 mL/min — ABNORMAL LOW (ref >90)
GFR calc non Af Amer: 36 mL/min — ABNORMAL LOW (ref >90)
Glucose, Bld: 101 mg/dL — ABNORMAL HIGH (ref 70–99)
Potassium: 4.6 mEq/L (ref 3.5–5.1)
Sodium: 141 mEq/L (ref 135–145)
Total Bilirubin: 0.2 mg/dL — ABNORMAL LOW (ref 0.3–1.2)
Total Protein: 7.7 g/dL (ref 6.0–8.3)

## 2011-11-23 LAB — URINE MICROSCOPIC-ADD ON

## 2011-11-23 MED ORDER — HYDROMORPHONE HCL 2 MG PO TABS
2.0000 mg | ORAL_TABLET | Freq: Once | ORAL | Status: AC
  Administered 2011-11-23: 2 mg via ORAL
  Filled 2011-11-23: qty 1

## 2011-11-23 MED ORDER — CIPROFLOXACIN HCL 500 MG PO TABS: 500.0000 mg | ORAL_TABLET | Freq: Two times a day (BID) | ORAL | Status: DC

## 2011-11-23 NOTE — MAU Note (Signed)
Peeing out blood with small clots and back pain  R lower back. Was seen here last Monday pain in L back and given antibiotic and pain med. Thought it would help but started seeing blood in urine yesterday

## 2011-11-23 NOTE — MAU Note (Signed)
[facility] CNM notified of pt's admission and status. Will see pt.

## 2011-11-23 NOTE — MAU Provider Note (Signed)
History     CSN: [id number]  Arrival date and time: 11/23/11 0543   First Provider Initiated Contact with Patient 11/23/11 [phone]      Chief Complaint  Patient presents with  . Back Pain  . Hematuria   HPI  Pt is here with report of hematuria for past week that has worsened in past 2 days.  Pt had a total abdominal hysterectomy/bilateral salpingectomy-oophorectomy and transvaginal tape procedure (bladder) on 0000000 without complications.  Pt seen in MAU on 11/17/11 for flank pain and hematuria and sent home on Cipro.  Wet prep that day negative, hemoglobin 13.1 and WBC 11.6.    Today patient reports increasing lower pelvic pain, flank pain and hematuria.  Denies fever, body aches, or chills.    Past Medical History  Diagnosis Date  . Hypertension   . Ovarian cyst   . Polycystic disease, ovaries   . Depression     no meds  . GERD (gastroesophageal reflux disease)   . Polycystic kidney disease     1998    Past Surgical History  Procedure Date  . No past surgeries   . Bladder suspension 08/11/2011    Procedure: TRANSVAGINAL TAPE (TVT) PROCEDURE;  Surgeon: Emily Filbert, MD;  Location: [facility];  Service: Gynecology;  Laterality: N/A;  Add:  Cystoscopy  . Abdominal hysterectomy 2013    Family History  Problem Relation Age of Onset  . Asthma Mother   . Hypertension Mother   . Asthma Father   . Hypertension Father     History  Substance Use Topics  . Smoking status: Current Every Day Smoker -- 0.5 packs/day for .5 years    Types: Cigarettes  . Smokeless tobacco: Former Systems developer    Quit date: 07/31/2011  . Alcohol Use: No    Allergies:  Allergies  Allergen Reactions  . Ace Inhibitors Cough    Per Rip Harbour at [facility]    Prescriptions prior to admission  Medication Sig Dispense Refill  . amLODipine (NORVASC) 10 MG tablet Take 10 mg by mouth daily.      . ciprofloxacin (CIPRO) 500 MG tablet Take 1 tablet (500 mg total) by mouth 2 (two) times daily.  14 tablet  0  .  oxyCODONE-acetaminophen (PERCOCET/ROXICET) 5-325 MG per tablet Take 1-2 tablets by mouth every 6 (six) hours as needed for pain.  15 tablet  0  . ciprofloxacin (CIPRO) 500 MG tablet Take 1 tablet (500 mg total) by mouth 2 (two) times daily.  14 tablet  0    Review of Systems  Constitutional: Negative.   Gastrointestinal: Positive for abdominal pain. Negative for nausea and vomiting.  Genitourinary: Positive for dysuria, frequency, hematuria and flank pain.  All other systems reviewed and are negative.   Physical Exam   Blood pressure 139/90, pulse 90, temperature 97.9 F (36.6 C), resp. rate 24, height 5\' 6"  (1.676 m), weight 80.74 kg (178 lb), SpO2 100.00%.  Physical Exam  Constitutional: She is oriented to person, place, and time. She appears well-developed and well-nourished.  HENT:  Head: Normocephalic.  Eyes: Pupils are equal, round, and reactive to light.  Neck: Normal range of motion. Neck supple.  Cardiovascular: Normal rate, regular rhythm and normal heart sounds.   Respiratory: Effort normal and breath sounds normal.  GI: Soft. She exhibits no mass. There is tenderness (midpelvic). There is CVA tenderness (right). There is no guarding.  Genitourinary:       Vaginal cuff intact - no bleeding seen.  Musculoskeletal: She  exhibits edema.  Neurological: She is alert and oriented to person, place, and time. She has normal reflexes.  Skin: Skin is warm and dry.    MAU Course  Procedures  Ultrasound:  1. Findings are again compatible with autosomal dominant  polycystic kidney disease, as above.  2. No hydronephrosis.  Results for orders placed during the hospital encounter of 11/23/11 (from the past 24 hour(s))  URINALYSIS, ROUTINE W REFLEX MICROSCOPIC     Status: Abnormal   Collection Time   11/23/11  6:00 AM      Component Value Range   Color, Urine RED (*) YELLOW   APPearance CLOUDY (*) CLEAR   Specific Gravity, Urine 1.025  1.005 - 1.030   pH 6.0  5.0 - 8.0    Glucose, UA NEGATIVE  NEGATIVE mg/dL   Hgb urine dipstick LARGE (*) NEGATIVE   Bilirubin Urine NEGATIVE  NEGATIVE   Ketones, ur NEGATIVE  NEGATIVE mg/dL   Protein, ur 100 (*) NEGATIVE mg/dL   Urobilinogen, UA 0.2  0.0 - 1.0 mg/dL   Nitrite NEGATIVE  NEGATIVE   Leukocytes, UA NEGATIVE  NEGATIVE  URINE MICROSCOPIC-ADD ON     Status: Abnormal   Collection Time   11/23/11  6:00 AM      Component Value Range   Squamous Epithelial / LPF RARE  RARE   WBC, UA 3-6  <3 WBC/hpf   RBC / HPF TOO NUMEROUS TO COUNT  <3 RBC/hpf   Bacteria, UA FEW (*) RARE   Urine-Other URINALYSIS PERFORMED ON SUPERNATANT     Dilaudid 2 mg  Consulted with Dr. Hillery Jacks HPI/labs/medical and surgical history and MAU visit on 11/17/11 > recommended a longer course of Cipro (10 days)  CBC/CMP pending Report given to Susa Simmonds at [phone] who assumes care of patient.  [facility]  Results for orders placed during the hospital encounter of 11/23/11 (from the past 24 hour(s))  URINALYSIS, ROUTINE W REFLEX MICROSCOPIC     Status: Abnormal   Collection Time   11/23/11  6:00 AM      Component Value Range   Color, Urine RED (*) YELLOW   APPearance CLOUDY (*) CLEAR   Specific Gravity, Urine 1.025  1.005 - 1.030   pH 6.0  5.0 - 8.0   Glucose, UA NEGATIVE  NEGATIVE mg/dL   Hgb urine dipstick LARGE (*) NEGATIVE   Bilirubin Urine NEGATIVE  NEGATIVE   Ketones, ur NEGATIVE  NEGATIVE mg/dL   Protein, ur 100 (*) NEGATIVE mg/dL   Urobilinogen, UA 0.2  0.0 - 1.0 mg/dL   Nitrite NEGATIVE  NEGATIVE   Leukocytes, UA NEGATIVE  NEGATIVE  URINE MICROSCOPIC-ADD ON     Status: Abnormal   Collection Time   11/23/11  6:00 AM      Component Value Range   Squamous Epithelial / LPF RARE  RARE   WBC, UA 3-6  <3 WBC/hpf   RBC / HPF TOO NUMEROUS TO COUNT  <3 RBC/hpf   Bacteria, UA FEW (*) RARE   Urine-Other URINALYSIS PERFORMED ON SUPERNATANT    CBC     Status: Normal   Collection Time   11/23/11  8:05 AM      Component  Value Range   WBC 9.1  4.0 - 10.5 K/uL   RBC 4.14  3.87 - 5.11 MIL/uL   Hemoglobin 12.9  12.0 - 15.0 g/dL   HCT 38.6  36.0 - 46.0 %   MCV 93.2  78.0 - 100.0 fL   MCH 31.2  26.0 - 34.0 pg   MCHC 33.4  30.0 - 36.0 g/dL   RDW 13.6  11.5 - 15.5 %   Platelets 269  150 - 400 K/uL  COMPREHENSIVE METABOLIC PANEL     Status: Abnormal   Collection Time   11/23/11  8:05 AM      Component Value Range   Sodium 141  135 - 145 mEq/L   Potassium 4.6  3.5 - 5.1 mEq/L   Chloride 104  96 - 112 mEq/L   CO2 25  19 - 32 mEq/L   Glucose, Bld 101 (*) 70 - 99 mg/dL   BUN 20  6 - 23 mg/dL   Creatinine, Ser 1.62 (*) 0.50 - 1.10 mg/dL   Calcium 9.4  8.4 - 10.5 mg/dL   Total Protein 7.7  6.0 - 8.3 g/dL   Albumin 3.1 (*) 3.5 - 5.2 g/dL   AST 12  0 - 37 U/L   ALT 16  0 - 35 U/L   Alkaline Phosphatase 69  39 - 117 U/L   Total Bilirubin 0.2 (*) 0.3 - 1.2 mg/dL   GFR calc non Af Amer 36 (*) >90 mL/min   GFR calc Af Amer 42 (*) >90 mL/min   US Renal  11/23/2011  *RADIOLOGY REPORT*  Clinical Data: Hematuria and flank pain.  RENAL/URINARY TRACT ULTRASOUND COMPLETE  Comparison:  05/26/2008.  Findings:  Right Kidney:  Severely enlarged measuring 22 cm in craniocaudal span.  Innumerable cystic lesions (many of which are septated and complex) are noted, largest of which measures 7.6 x 7.3 x 6.8 cm. No definite hydronephrosis.  Left Kidney:  Severely enlarged measuring 20 cm in craniocaudal span.  Innumerable cystic lesions  (many of which are septated and complex) throughout the parenchyma, largest of which measures 6.5 x 5.0 x 5.5 cm.  No hydronephrosis.  Bladder:  Urinary bladder is unremarkable in appearance.  Bilateral ureteral jets are noted.  IMPRESSION: 1.  Findings are again compatible with autosomal dominant polycystic kidney disease, as above. 2.  No hydronephrosis.   Original Report Authenticated By: Vinnie Langton, M.D.     Assessment and Plan   1. UTI (lower urinary tract infection)   2. Polycystic kidney  disease       Medication List     As of 11/23/2011  9:37 AM    CONTINUE taking these medications         amLODipine 10 MG tablet   Commonly known as: NORVASC      ciprofloxacin 500 MG tablet   Commonly known as: CIPRO   Take 1 tablet (500 mg total) by mouth 2 (two) times daily. 14 day course, not yet completed      oxyCODONE-acetaminophen 5-325 MG per tablet   Commonly known as: PERCOCET/ROXICET   Take 1-2 tablets by mouth every 6 (six) hours as needed for pain.          Where to get your medications    These are the prescriptions that you need to pick up. We sent them to a specific pharmacy, so you will need to go there to get them.   [id number] - [place], [address]    [address]   [address]    Phone: [phone]        ciprofloxacin 500 MG tablet            Follow-up Information    Please follow  up. (a primary care provider  for regular health care)       Follow up with [facility]. (if your symptoms worsen)    Contact information:   [id number] [place] [phone]           [facility] 11/23/2011, 7:52 AM

## 2011-11-24 LAB — URINE CULTURE
Colony Count: NO GROWTH
Culture: NO GROWTH

## 2011-11-24 NOTE — MAU Provider Note (Signed)
Chart reviewed and agree with management and plan.  

## 2011-12-16 ENCOUNTER — Emergency Department (HOSPITAL_COMMUNITY)
Admission: EM | Admit: 2011-12-16 | Discharge: 2011-12-16 | Disposition: A | Discharge status: Home / Self Care | Payer: Self-pay | Source: Home / Self Care

## 2011-12-16 ENCOUNTER — Encounter (HOSPITAL_COMMUNITY): Payer: Self-pay

## 2011-12-16 DIAGNOSIS — Z90710 Acquired absence of both cervix and uterus: Secondary | ICD-10-CM | POA: Insufficient documentation

## 2011-12-16 DIAGNOSIS — N289 Disorder of kidney and ureter, unspecified: Secondary | ICD-10-CM | POA: Insufficient documentation

## 2011-12-16 DIAGNOSIS — N189 Chronic kidney disease, unspecified: Secondary | ICD-10-CM

## 2011-12-16 DIAGNOSIS — Z992 Dependence on renal dialysis: Secondary | ICD-10-CM | POA: Insufficient documentation

## 2011-12-16 DIAGNOSIS — N186 End stage renal disease: Secondary | ICD-10-CM | POA: Insufficient documentation

## 2011-12-16 DIAGNOSIS — Q613 Polycystic kidney, unspecified: Secondary | ICD-10-CM | POA: Insufficient documentation

## 2011-12-16 DIAGNOSIS — R232 Flushing: Secondary | ICD-10-CM | POA: Insufficient documentation

## 2011-12-16 DIAGNOSIS — I10 Essential (primary) hypertension: Secondary | ICD-10-CM | POA: Insufficient documentation

## 2011-12-16 HISTORY — DX: End stage renal disease: N18.6

## 2011-12-16 MED ORDER — HYDROCHLOROTHIAZIDE 12.5 MG PO TABS: 12.5000 mg | ORAL_TABLET | Freq: Every day | ORAL | Status: DC

## 2011-12-16 MED ORDER — AMLODIPINE BESYLATE 10 MG PO TABS: 10.0000 mg | ORAL_TABLET | Freq: Every day | ORAL | Status: DC

## 2011-12-16 NOTE — ED Notes (Signed)
Patient  States has high blood pressure and here for medication

## 2011-12-16 NOTE — ED Provider Notes (Signed)
History     CSN: [id number]  Arrival date & time 12/16/11  1628  Chief Complaint  Patient presents with  . Medication Refill    hypertension- needs refill on medicine   HPI Pt has reported that she is out of her BP meds.  She is taking amlodipine 10 mg daily.  She has a history of polycystic kidney disease and renal insufficiency but has not been able to see a nephrologist because of not having insurance.  Pt recently had a hysterectomy and reporting that she could not afford to get the medications prescribed by the gynecologist and now having hot flashes.  She also reports having headaches related to her elevated blood pressures.    Past Medical History  Diagnosis Date  . Hypertension   . Ovarian cyst   . Polycystic disease, ovaries   . Depression     no meds  . GERD (gastroesophageal reflux disease)   . Polycystic kidney disease     1998    Past Surgical History  Procedure Date  . No past surgeries   . Bladder suspension 08/11/2011    Procedure: TRANSVAGINAL TAPE (TVT) PROCEDURE;  Surgeon: Emily Filbert, MD;  Location: [facility];  Service: Gynecology;  Laterality: N/A;  Add:  Cystoscopy  . Abdominal hysterectomy 2013    Family History  Problem Relation Age of Onset  . Asthma Mother   . Hypertension Mother   . Asthma Father   . Hypertension Father     History  Substance Use Topics  . Smoking status: Current Every Day Smoker -- 0.5 packs/day for .5 years    Types: Cigarettes  . Smokeless tobacco: Former Systems developer    Quit date: 07/31/2011  . Alcohol Use: No    OB History    Grav Para Term Preterm Abortions TAB SAB Ect Mult Living   5 2 2  3 2 1   2      Review of Systems  Constitutional: Negative.   Eyes: Negative.   Respiratory: Negative.   Cardiovascular: Negative.   Gastrointestinal: Negative.   Musculoskeletal: Negative.   Neurological: Positive for headaches. Negative for tremors, seizures, syncope, speech difficulty, weakness and numbness.  Hematological:  Negative.   Psychiatric/Behavioral: Negative.     Allergies  Ace inhibitors  Home Medications   Current Outpatient Rx  Name  Route  Sig  Dispense  Refill  . AMLODIPINE BESYLATE 10 MG PO TABS   Oral   Take 10 mg by mouth daily.         Marland Kitchen CIPROFLOXACIN HCL 500 MG PO TABS   Oral   Take 1 tablet (500 mg total) by mouth 2 (two) times daily. 14 day course, not yet completed   20 tablet   0   . OXYCODONE-ACETAMINOPHEN 5-325 MG PO TABS   Oral   Take 1-2 tablets by mouth every 6 (six) hours as needed for pain.   15 tablet   0     BP 144/97  Pulse 80  Temp 98.4 F (36.9 C) (Oral)  Resp 20  SpO2 97%  Physical Exam  Nursing note and vitals reviewed. Constitutional: She is oriented to person, place, and time. She appears well-developed and well-nourished. She appears distressed.  HENT:  Head: Normocephalic and atraumatic.  Eyes: Conjunctivae normal are normal. Pupils are equal, round, and reactive to light.  Neck: Normal range of motion. Neck supple. No thyromegaly present.  Cardiovascular: Normal rate and regular rhythm.   Pulmonary/Chest: Effort normal and breath sounds  normal.  Abdominal: Soft. Bowel sounds are normal. She exhibits no distension and no mass. There is no tenderness. There is no rebound and no guarding.  Musculoskeletal: Normal range of motion.  Neurological: She is alert and oriented to person, place, and time.  Skin: Skin is warm and dry. No rash noted. No erythema. No pallor.    ED Course  Procedures (including critical care time)  Labs Reviewed - No data to display No results found.  No diagnosis found.   MDM  IMPRESSION  Polycystic Kidney disease  Chronic Renal Insufficiency  Hypertension  Headaches  Hot Flashes s/p having recent hysterectomy  RECOMMENDATIONS / PLAN Refilled amlodipine 10 mg daily, plus added HCTZ 12.5 mg po daily Reviewed labs and ER records Trial of OTC black cohosh and pt was instructed to call her  gynecologist about her hot flashes and assistance with her hormone medications  FOLLOW UP See a nephrologist:  Will request a referral for eval of CRF and polycystic kidney disease   The patient was given clear instructions to go to ER or return to medical center if symptoms don't improve, worsen or new problems develop.  The patient verbalized understanding.  The patient was told to call to get lab results if they haven't heard anything in the next week.     Murlean Iba, MD 12/16/11 1737

## 2011-12-23 ENCOUNTER — Telehealth (HOSPITAL_COMMUNITY): Payer: Self-pay

## 2012-02-05 NOTE — ED Notes (Signed)
Referral to [facility]rologist [facility]-no appt yet-we follow up weekly

## 2012-02-11 NOTE — ED Notes (Signed)
Has an appt 03/16/12 @2 :30 pm [facility]-Dr Posey Pronto

## 2012-05-08 ENCOUNTER — Emergency Department (HOSPITAL_COMMUNITY)
Admission: EM | Admit: 2012-05-08 | Discharge: 2012-05-08 | Disposition: A | Discharge status: Home / Self Care | Payer: PRIVATE HEALTH INSURANCE | Attending: Emergency Medicine | Admitting: Emergency Medicine

## 2012-05-08 ENCOUNTER — Encounter (HOSPITAL_COMMUNITY): Payer: Self-pay | Admitting: Physical Medicine and Rehabilitation

## 2012-05-08 DIAGNOSIS — Z8701 Personal history of pneumonia (recurrent): Secondary | ICD-10-CM | POA: Insufficient documentation

## 2012-05-08 DIAGNOSIS — Z87448 Personal history of other diseases of urinary system: Secondary | ICD-10-CM | POA: Insufficient documentation

## 2012-05-08 DIAGNOSIS — Z8742 Personal history of other diseases of the female genital tract: Secondary | ICD-10-CM | POA: Insufficient documentation

## 2012-05-08 DIAGNOSIS — Z8659 Personal history of other mental and behavioral disorders: Secondary | ICD-10-CM | POA: Insufficient documentation

## 2012-05-08 DIAGNOSIS — Z79899 Other long term (current) drug therapy: Secondary | ICD-10-CM | POA: Insufficient documentation

## 2012-05-08 DIAGNOSIS — R05 Cough: Secondary | ICD-10-CM | POA: Insufficient documentation

## 2012-05-08 DIAGNOSIS — R059 Cough, unspecified: Secondary | ICD-10-CM | POA: Insufficient documentation

## 2012-05-08 DIAGNOSIS — Z8719 Personal history of other diseases of the digestive system: Secondary | ICD-10-CM | POA: Insufficient documentation

## 2012-05-08 DIAGNOSIS — J069 Acute upper respiratory infection, unspecified: Secondary | ICD-10-CM

## 2012-05-08 DIAGNOSIS — F17200 Nicotine dependence, unspecified, uncomplicated: Secondary | ICD-10-CM | POA: Insufficient documentation

## 2012-05-08 DIAGNOSIS — I10 Essential (primary) hypertension: Secondary | ICD-10-CM | POA: Insufficient documentation

## 2012-05-08 MED ORDER — ALBUTEROL SULFATE HFA 108 (90 BASE) MCG/ACT IN AERS
2.0000 | INHALATION_SPRAY | Freq: Once | RESPIRATORY_TRACT | Status: AC
  Administered 2012-05-08: 2 via RESPIRATORY_TRACT
  Filled 2012-05-08: qty 6.7

## 2012-05-08 MED ORDER — BENZONATATE 100 MG PO CAPS: 200.0000 mg | ORAL_CAPSULE | Freq: Two times a day (BID) | ORAL | Status: DC | PRN

## 2012-05-08 MED ORDER — AZITHROMYCIN 250 MG PO TABS: 250.0000 mg | ORAL_TABLET | Freq: Every day | ORAL | Status: DC

## 2012-05-08 NOTE — ED Notes (Signed)
Pt presents to department for evaluation of sinus and chest congestion, productive cough, and body aches. Ongoing x4 days. States green sputum. Respirations unlabored. Pt is conscious alert and oriented x4.

## 2012-05-08 NOTE — ED Notes (Signed)
Pt dc'd home w/all belongings, alert and ambulatory upon dc, 3 new rx given, pt verbalizes understanding of dc instructions, drove self home, no narcotics given in ed

## 2012-05-08 NOTE — ED Provider Notes (Signed)
History     CSN: [id number]  Arrival date & time 05/08/12  1845   First MD Initiated Contact with Patient 05/08/12 1903      Chief Complaint  Patient presents with  . Nasal Congestion  . Cough    (Consider location/radiation/quality/duration/timing/severity/associated sxs/prior treatment) HPI Comments: 52 year old female resents with a complaint of upper respiratory symptoms. She states that approximately 4 days ago she developed body aches, fevers and chills and subsequently developed a mild nonproductive cough, nasal congestion, watery diarrhea. The diarrhea has resolved, she has not been vomiting but has had persistent cough and nasal congestion. Nothing seems to make it better or worse, no objective fevers measured at home. No medications prior to arrival. The patient is concerned because of a prior history of pneumonia last year  The history is provided by the patient.    Past Medical History  Diagnosis Date  . Hypertension   . Ovarian cyst   . Polycystic disease, ovaries   . Depression     no meds  . GERD (gastroesophageal reflux disease)   . Polycystic kidney disease     1998    Past Surgical History  Procedure Laterality Date  . No past surgeries    . Bladder suspension  08/11/2011    Procedure: TRANSVAGINAL TAPE (TVT) PROCEDURE;  Surgeon: Emily Filbert, MD;  Location: [facility];  Service: Gynecology;  Laterality: N/A;  Add:  Cystoscopy  . Abdominal hysterectomy  2013    Family History  Problem Relation Age of Onset  . Asthma Mother   . Hypertension Mother   . Asthma Father   . Hypertension Father     History  Substance Use Topics  . Smoking status: Current Every Day Smoker -- 0.50 packs/day for .5 years    Types: Cigarettes  . Smokeless tobacco: Former Systems developer    Quit date: 07/31/2011  . Alcohol Use: No    OB History   Grav Para Term Preterm Abortions TAB SAB Ect Mult Living   5 2 2  3 2 1   2       Review of Systems  All other systems reviewed and are  negative.    Allergies  Ace inhibitors  Home Medications   Current Outpatient Rx  Name  Route  Sig  Dispense  Refill  . amLODipine (NORVASC) 10 MG tablet   Oral   Take 1 tablet (10 mg total) by mouth daily.   30 tablet   4   . azithromycin (ZITHROMAX Z-PAK) 250 MG tablet   Oral   Take 1 tablet (250 mg total) by mouth daily. 500mg  PO day 1, then 250mg  PO days 205   6 tablet   0   . benzonatate (TESSALON) 100 MG capsule   Oral   Take 2 capsules (200 mg total) by mouth 2 (two) times daily as needed for cough.   20 capsule   0   . hydrochlorothiazide (HYDRODIURIL) 12.5 MG tablet   Oral   Take 1 tablet (12.5 mg total) by mouth daily.   30 tablet   4     BP 122/75  Pulse 99  Temp(Src) 97.9 F (36.6 C) (Oral)  Resp 20  SpO2 97%  Physical Exam  Nursing note and vitals reviewed. Constitutional: She appears well-developed and well-nourished. No distress.  HENT:  Head: Normocephalic and atraumatic.  Mouth/Throat: Oropharynx is clear and moist. No oropharyngeal exudate.  Oropharynx is clear, mucous membranes are moist, nasal passages have swollen turbinates but no  significant discharge and there is no tenderness over the sinuses at the maxillary or frontal area.  Eyes: Conjunctivae and EOM are normal. Pupils are equal, round, and reactive to light. Right eye exhibits no discharge. Left eye exhibits no discharge. No scleral icterus.  Neck: Normal range of motion. Neck supple. No JVD present. No thyromegaly present.  Supple neck, normal range of motion without any stiffness or meningismus, no lymphadenopathy, no torticollis  Cardiovascular: Normal rate, regular rhythm, normal heart sounds and intact distal pulses.  Exam reveals no gallop and no friction rub.   No murmur heard. Pulmonary/Chest: Effort normal and breath sounds normal. No respiratory distress. She has no wheezes. She has no rales.  Abdominal: Soft. Bowel sounds are normal. She exhibits no distension and no  mass. There is no tenderness.  Musculoskeletal: Normal range of motion. She exhibits no edema and no tenderness.  Lymphadenopathy:    She has no cervical adenopathy.  Neurological: She is alert. Coordination normal.  Skin: Skin is warm and dry. No rash noted. No erythema.  Psychiatric: She has a normal mood and affect. Her behavior is normal.    ED Course  Procedures (including critical care time)  Labs Reviewed - No data to display No results found.   1. URI (upper respiratory infection)       MDM  Well-appearing, normal vital signs, no fever or tachycardia or hypotension, likely has upper respiratory symptoms but due to progressively worsening cough will add albuterol MDI and Zithromax. Patient stable for discharge and has expressed her understanding to the indications for followup   Meds given in ED:  Medications  albuterol (PROVENTIL HFA;VENTOLIN HFA) 108 (90 BASE) MCG/ACT inhaler 2 puff (not administered)    New Prescriptions   AZITHROMYCIN (ZITHROMAX Z-PAK) 250 MG TABLET    Take 1 tablet (250 mg total) by mouth daily. 500mg  PO day 1, then 250mg  PO days 205   BENZONATATE (TESSALON) 100 MG CAPSULE    Take 2 capsules (200 mg total) by mouth 2 (two) times daily as needed for cough.            Johnna Acosta, MD 05/08/12 [phone]

## 2012-05-08 NOTE — ED Notes (Signed)
Pt presents with c/o cold chills, nasal and chest congestion, productive cough w/green colored sputum, and bosy aches x4 days.

## 2012-07-03 ENCOUNTER — Encounter (HOSPITAL_COMMUNITY): Payer: Self-pay | Admitting: Emergency Medicine

## 2012-07-03 ENCOUNTER — Emergency Department (HOSPITAL_COMMUNITY)
Admission: EM | Admit: 2012-07-03 | Discharge: 2012-07-03 | Disposition: A | Discharge status: Home / Self Care | Payer: PRIVATE HEALTH INSURANCE | Attending: Emergency Medicine | Admitting: Emergency Medicine

## 2012-07-03 DIAGNOSIS — Z862 Personal history of diseases of the blood and blood-forming organs and certain disorders involving the immune mechanism: Secondary | ICD-10-CM | POA: Insufficient documentation

## 2012-07-03 DIAGNOSIS — F17200 Nicotine dependence, unspecified, uncomplicated: Secondary | ICD-10-CM | POA: Insufficient documentation

## 2012-07-03 DIAGNOSIS — Z8659 Personal history of other mental and behavioral disorders: Secondary | ICD-10-CM | POA: Insufficient documentation

## 2012-07-03 DIAGNOSIS — S0501XA Injury of conjunctiva and corneal abrasion without foreign body, right eye, initial encounter: Secondary | ICD-10-CM

## 2012-07-03 DIAGNOSIS — X58XXXA Exposure to other specified factors, initial encounter: Secondary | ICD-10-CM | POA: Insufficient documentation

## 2012-07-03 DIAGNOSIS — S058X9A Other injuries of unspecified eye and orbit, initial encounter: Secondary | ICD-10-CM | POA: Insufficient documentation

## 2012-07-03 DIAGNOSIS — M7989 Other specified soft tissue disorders: Secondary | ICD-10-CM

## 2012-07-03 DIAGNOSIS — Z8742 Personal history of other diseases of the female genital tract: Secondary | ICD-10-CM | POA: Insufficient documentation

## 2012-07-03 DIAGNOSIS — Z87718 Personal history of other specified (corrected) congenital malformations of genitourinary system: Secondary | ICD-10-CM | POA: Insufficient documentation

## 2012-07-03 DIAGNOSIS — Z79899 Other long term (current) drug therapy: Secondary | ICD-10-CM | POA: Insufficient documentation

## 2012-07-03 DIAGNOSIS — Y929 Unspecified place or not applicable: Secondary | ICD-10-CM | POA: Insufficient documentation

## 2012-07-03 DIAGNOSIS — Y9389 Activity, other specified: Secondary | ICD-10-CM | POA: Insufficient documentation

## 2012-07-03 DIAGNOSIS — I10 Essential (primary) hypertension: Secondary | ICD-10-CM | POA: Insufficient documentation

## 2012-07-03 DIAGNOSIS — Z8639 Personal history of other endocrine, nutritional and metabolic disease: Secondary | ICD-10-CM | POA: Insufficient documentation

## 2012-07-03 DIAGNOSIS — Z8719 Personal history of other diseases of the digestive system: Secondary | ICD-10-CM | POA: Insufficient documentation

## 2012-07-03 MED ORDER — TETRACAINE HCL 0.5 % OP SOLN
2.0000 [drp] | Freq: Once | OPHTHALMIC | Status: AC
  Administered 2012-07-03: 2 [drp] via OPHTHALMIC
  Filled 2012-07-03: qty 2

## 2012-07-03 MED ORDER — FLUORESCEIN SODIUM 1 MG OP STRP
1.0000 | ORAL_STRIP | Freq: Once | OPHTHALMIC | Status: AC
  Administered 2012-07-03: 1 via OPHTHALMIC
  Filled 2012-07-03: qty 2

## 2012-07-03 MED ORDER — ERYTHROMYCIN 5 MG/GM OP OINT: TOPICAL_OINTMENT | OPHTHALMIC | Status: DC

## 2012-07-03 NOTE — ED Notes (Addendum)
Pt reports sudden left leg swelling since she has been here no redness or pain; PA made aware.

## 2012-07-03 NOTE — ED Provider Notes (Signed)
History     CSN: [id number]  Arrival date & time 07/03/12  1100   First MD Initiated Contact with Patient 07/03/12 1135      Chief Complaint  Patient presents with  . Eye Problem    (Consider location/radiation/quality/duration/timing/severity/associated sxs/prior treatment) HPI Comments: Patient is a 52 year old female presents today with 3 days of bilateral eye redness. It all began when she had fake eyelashes placed. She states initially she knew it was irritating, but went to work for a full workday. Afterwards she went back to the place that her eyelashes on and they noticed a hair in her left eye. They cut the hair and told her to return if it got worse. The next day she noticed continuing swelling and injection of her eyes. Yesterday she had her eyelashes were removed totally. She states the swelling has significantly improved, but her eyes are watering bilaterally and there is still injection, worse in her left eye. She denies photophobia, pain, visual disturbance. She does not wear contacts and only wears [employer] reading glasses.  The history is provided by the patient. No language interpreter was used.    Past Medical History  Diagnosis Date  . Hypertension   . Ovarian cyst   . Polycystic disease, ovaries   . Depression     no meds  . GERD (gastroesophageal reflux disease)   . Polycystic kidney disease     1998    Past Surgical History  Procedure Laterality Date  . No past surgeries    . Bladder suspension  08/11/2011    Procedure: TRANSVAGINAL TAPE (TVT) PROCEDURE;  Surgeon: Emily Filbert, MD;  Location: [facility];  Service: Gynecology;  Laterality: N/A;  Add:  Cystoscopy  . Abdominal hysterectomy  2013    Family History  Problem Relation Age of Onset  . Asthma Mother   . Hypertension Mother   . Asthma Father   . Hypertension Father     History  Substance Use Topics  . Smoking status: Current Every Day Smoker -- 0.50 packs/day for .5 years    Types: Cigarettes   . Smokeless tobacco: Former Systems developer    Quit date: 07/31/2011  . Alcohol Use: No    OB History   Grav Para Term Preterm Abortions TAB SAB Ect Mult Living   5 2 2  3 2 1   2       Review of Systems  Constitutional: Negative for fever and chills.  Eyes: Positive for pain, discharge (tearing) and redness. Negative for photophobia, itching and visual disturbance.  Respiratory: Negative for shortness of breath.   Cardiovascular: Negative for chest pain.  Gastrointestinal: Negative for nausea and vomiting.  Musculoskeletal: Negative for joint swelling and arthralgias.  All other systems reviewed and are negative.    Allergies  Ace inhibitors  Home Medications   Current Outpatient Rx  Name  Route  Sig  Dispense  Refill  . acetaminophen (TYLENOL) 500 MG tablet   Oral   Take 1,000 mg by mouth daily as needed for pain.         Marland Kitchen amLODipine (NORVASC) 10 MG tablet   Oral   Take 1 tablet (10 mg total) by mouth daily.   30 tablet   4   . Aspirin-Salicylamide-Caffeine (BC HEADACHE PO)   Oral   Take 1 Package by mouth daily as needed (for pain).         Marland Kitchen azithromycin (ZITHROMAX Z-PAK) 250 MG tablet   Oral   Take 1  tablet (250 mg total) by mouth daily. 500mg  PO day 1, then 250mg  PO days 205   6 tablet   0   . benzonatate (TESSALON) 100 MG capsule   Oral   Take 2 capsules (200 mg total) by mouth 2 (two) times daily as needed for cough.   20 capsule   0   . guaifenesin (ROBITUSSIN) 100 MG/5ML syrup   Oral   Take 100 mg by mouth 3 (three) times daily as needed for cough.         . hydrochlorothiazide (HYDRODIURIL) 12.5 MG tablet   Oral   Take 1 tablet (12.5 mg total) by mouth daily.   30 tablet   4     BP 146/86  Pulse 88  Temp(Src) 98.2 F (36.8 C) (Oral)  Resp 18  SpO2 99%  Physical Exam  Nursing note and vitals reviewed. Constitutional: She is oriented to person, place, and time. She appears well-developed and well-nourished. No distress.  HENT:  Head:  Normocephalic and atraumatic.  Right Ear: External ear normal.  Left Ear: External ear normal.  Nose: Nose normal.  Mouth/Throat: Oropharynx is clear and moist.  Eyes: EOM and lids are normal. Pupils are equal, round, and reactive to light. No foreign bodies found. Right conjunctiva is injected. Right conjunctiva has no hemorrhage. Left conjunctiva is injected. Left conjunctiva has no hemorrhage.  Slit lamp exam:      The right eye shows corneal abrasion and fluorescein uptake. The right eye shows no corneal ulcer.       The left eye shows fluorescein uptake. The left eye shows no corneal abrasion and no corneal ulcer.  Corneal abrasion on right lower eye; no seidel sign bilaterally  Generalized uptake of fluorescein dye on left eye  tonopen 12 in right eye, 13 in left eye Visual acuity 20/20 bilaterally  Neck: Normal range of motion.  Cardiovascular: Normal rate, regular rhythm and normal heart sounds.   Pulmonary/Chest: Effort normal and breath sounds normal. No stridor. No respiratory distress. She has no wheezes. She has no rales.  Abdominal: Soft. She exhibits no distension.  Musculoskeletal: Normal range of motion.  Neurological: She is alert and oriented to person, place, and time. She has normal strength.  Skin: Skin is warm and dry. She is not diaphoretic. No erythema.  Psychiatric: She has a normal mood and affect. Her behavior is normal.    ED Course  Procedures (including critical care time)  Labs Reviewed - No data to display No results found.  During course of ED stay patient developed unilateral left low leg and ankle swelling without erythema. Venous ultrasound done which is negative for DVT.   1. Corneal abrasion, right, initial encounter       MDM  Pt with corneal abrasion on PE. No evidence of FB.  No change in vision, acuity equal bilaterally.  Pt is not a contact lens wearer.  Exam non-concerning for orbital cellulitis, hyphema, corneal ulcers. Patient will  be discharged home with erythromycin.   Patient understands to follow up with ophthalmology, & to return to ER if new symptoms develop including change in vision, purulent drainage, or entrapment.  During course of ED stay patient developed unilateral left ankle and low leg swelling. Venous US negative for DVT.           Elwyn Lade, PA-C 07/04/12 1859

## 2012-07-03 NOTE — Progress Notes (Signed)
VASCULAR LAB PRELIMINARY  PRELIMINARY  PRELIMINARY  PRELIMINARY  Left lower extremity venous Doppler completed.    Preliminary report:  There is no DVT or SVT noted in the left lower extremity.  Britten Parady, RVT 07/03/2012, 2:09 PM

## 2012-07-03 NOTE — ED Notes (Signed)
Pt reports had fake eyelashes placed about 3 days ago. Pt reports thinks the glue has caused irritation to her eyes. Pt had the eyelashes removed yesterday. Pt presents with redness to B/L eyes with white drainage.

## 2012-07-05 NOTE — ED Provider Notes (Signed)
Medical screening examination/treatment/procedure(s) were performed by non-physician practitioner and as supervising physician I was immediately available for consultation/collaboration.  Ezequiel Essex, MD 07/05/12 1118

## 2012-11-15 ENCOUNTER — Emergency Department (HOSPITAL_COMMUNITY)
Admission: EM | Admit: 2012-11-15 | Discharge: 2012-11-15 | Disposition: A | Discharge status: Home / Self Care | Payer: PRIVATE HEALTH INSURANCE | Attending: Emergency Medicine | Admitting: Emergency Medicine

## 2012-11-15 ENCOUNTER — Encounter (HOSPITAL_COMMUNITY): Payer: Self-pay | Admitting: Emergency Medicine

## 2012-11-15 ENCOUNTER — Emergency Department (HOSPITAL_COMMUNITY): Payer: PRIVATE HEALTH INSURANCE

## 2012-11-15 DIAGNOSIS — Z8719 Personal history of other diseases of the digestive system: Secondary | ICD-10-CM | POA: Insufficient documentation

## 2012-11-15 DIAGNOSIS — Z8742 Personal history of other diseases of the female genital tract: Secondary | ICD-10-CM | POA: Insufficient documentation

## 2012-11-15 DIAGNOSIS — Z862 Personal history of diseases of the blood and blood-forming organs and certain disorders involving the immune mechanism: Secondary | ICD-10-CM | POA: Insufficient documentation

## 2012-11-15 DIAGNOSIS — M25562 Pain in left knee: Secondary | ICD-10-CM

## 2012-11-15 DIAGNOSIS — Z8659 Personal history of other mental and behavioral disorders: Secondary | ICD-10-CM | POA: Insufficient documentation

## 2012-11-15 DIAGNOSIS — I10 Essential (primary) hypertension: Secondary | ICD-10-CM | POA: Insufficient documentation

## 2012-11-15 DIAGNOSIS — M25469 Effusion, unspecified knee: Secondary | ICD-10-CM | POA: Insufficient documentation

## 2012-11-15 DIAGNOSIS — M7122 Synovial cyst of popliteal space [Baker], left knee: Secondary | ICD-10-CM

## 2012-11-15 DIAGNOSIS — F17200 Nicotine dependence, unspecified, uncomplicated: Secondary | ICD-10-CM | POA: Insufficient documentation

## 2012-11-15 DIAGNOSIS — Z8639 Personal history of other endocrine, nutritional and metabolic disease: Secondary | ICD-10-CM | POA: Insufficient documentation

## 2012-11-15 DIAGNOSIS — M7120 Synovial cyst of popliteal space [Baker], unspecified knee: Secondary | ICD-10-CM | POA: Insufficient documentation

## 2012-11-15 MED ORDER — HYDROCODONE-ACETAMINOPHEN 5-325 MG PO TABS: 1.0000 | ORAL_TABLET | Freq: Four times a day (QID) | ORAL | Status: DC | PRN

## 2012-11-15 MED ORDER — NAPROXEN 500 MG PO TABS: 500.0000 mg | ORAL_TABLET | Freq: Two times a day (BID) | ORAL | Status: DC

## 2012-11-15 NOTE — ED Notes (Signed)
Pt presents with c/o left knee pain and swelling. Pt says that the pain has been going on for months but it became worse 2 days ago. Denies injury.

## 2012-11-15 NOTE — ED Provider Notes (Signed)
CSN: [id number]     Arrival date & time 11/15/12  1738 History   This chart was scribed for non-physician practitioner Hyman Bible, PA-C working with Mirna Mires, MD by Eston Mould, ED Scribe. This patient was seen in room WTR5/WTR5 and the patient's care was started at 6:38 PM .  Chief Complaint  Patient presents with  . Knee Pain   The history is provided by the patient. No language interpreter was used.   HPI Comments: Kristen Moreno is a 52 y.o. female who presents to the Emergency Department complaining of ongoing worsening L knee pain with associated swelling onset 3 days. Pt states she has had this knee pain for about a month but it generally comes and goes. Pt states when walking, she feels as if  "something wants to pop". 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 Pt was concerned when she felt a "knot" on the back of her L knee yesterday. She denies known hx of arthritis. Pt denies any recent injuries. Pt states she has put rubbing alcohol to area and states she believes swelling has been reduced.  Pt denies hx of blood clots. Pt denies any prolonged recent travels in the past 4 weeks. Pt state she is a smoker. Pt denies any recent surgeries in the past 4 weeks. Pt denies fever and chills.  Past Medical History  Diagnosis Date  . Hypertension   . Ovarian cyst   . Polycystic disease, ovaries   . Depression     no meds  . GERD (gastroesophageal reflux disease)   . Polycystic kidney disease     1998   Past Surgical History  Procedure Laterality Date  . No past surgeries    . Bladder suspension  08/11/2011    Procedure: TRANSVAGINAL TAPE (TVT) PROCEDURE;  Surgeon: Emily Filbert, MD;  Location: [facility];  Service: Gynecology;  Laterality: N/A;  Add:  Cystoscopy  . Abdominal hysterectomy  2013   Family History  Problem Relation Age of Onset  . Asthma Mother   . Hypertension Mother   . Asthma Father   .  Hypertension Father    History  Substance Use Topics  . Smoking status: Current Every Day Smoker -- 0.50 packs/day for .5 years    Types: Cigarettes  . Smokeless tobacco: Former Systems developer    Quit date: 07/31/2011  . Alcohol Use: No   OB History   Grav Para Term Preterm Abortions TAB SAB Ect Mult Living   5 2 2  3 2 1   2      Review of Systems A complete 10 system review of systems was obtained and all systems are negative except as noted in the HPI and PMH.   Allergies  Ace inhibitors  Home Medications   Current Outpatient Rx  Name  Route  Sig  Dispense  Refill  . Aspirin-Salicylamide-Caffeine (BC HEADACHE PO)   Oral   Take 1 packet by mouth daily as needed (headache / knee pain).          Triage Vitals:BP 168/95  Pulse 88  Temp(Src) 98.8 F (37.1 C) (Oral)  Resp 18  SpO2 97%  Physical Exam  Nursing note and vitals reviewed. Constitutional: She is oriented to person, place, and time. She appears well-developed and well-nourished. No distress.  HENT:  Head: Normocephalic and atraumatic.  Eyes: EOM are normal. Pupils are equal, round, and reactive to light.  Neck: Normal range of  motion. Neck supple. No tracheal deviation present.  Cardiovascular: Normal rate, regular rhythm and intact distal pulses.   Pulmonary/Chest: Effort normal and breath sounds normal. No respiratory distress.  Musculoskeletal: Normal range of motion.  Mild diffuse swelling of the left knee. Baker's cyst in the left popliteal fossa. No erythema or warmth of the left knee.  Full ROM of the left knee.  Negative Homan's sign of the left leg. 2+ dorsal pedis pulse bilaterally.  Neurological: She is alert and oriented to person, place, and time.  Distal sensation of the left foot is intact.  Skin: Skin is warm and dry. She is not diaphoretic.  Psychiatric: She has a normal mood and affect. Her behavior is normal.    ED Course  Procedures  DIAGNOSTIC STUDIES: Oxygen Saturation is 97% on RA, normal  by my interpretation.    COORDINATION OF CARE: 6:47 PM-Discussed treatment plan which includes X-Ray. Will provide pt with list of PCP. Pt agreed to plan.   7:33 PM-Discussed radiology findings with pt.  Labs Review Labs Reviewed - No data to display Imaging Review Dg Knee Complete 4 Views Left  11/15/2012   CLINICAL DATA:  Pain and swelling.  EXAM: LEFT KNEE - COMPLETE 4+ VIEW  COMPARISON:  05/05/2010  FINDINGS: Progressive small marginal spurs about the medial compartment and from the lateral tibial plateau. Small effusion in the suprapatellar bursa. Negative for fracture, dislocation, or other acute bone abnormality. Normal mineralization and alignment.  IMPRESSION: 1. Negative for fracture or other acute abnormality. 2. Worsening degenerative spurring with effusion.   Electronically Signed   By: Arne Cleveland M.D.   On: 11/15/2012 19:09    EKG Interpretation   None       MDM  No diagnosis found. Patient presenting with knee pain and also a mass of the popliteal fossa.  Xray showing no acute abnormality, but worsening degenerative spurring with small effusion.  Patient with full ROM of the knee.  Patient is afebrile.  No erythema or warmth of the knee.  Therefore, doubt septic joint.  Pain and swelling most likely due to the degenerative spurring.  Mass of the popliteal fossa most consistent with Baker's Cyst.  Feel that the patient is stable for discharge.  Patient given referral to Orthopedics.  Return precautions given.  I personally performed the services described in this documentation, which was scribed in my presence. The recorded information has been reviewed and is accurate.    Hyman Bible, PA-C 11/16/12 2221

## 2012-11-18 NOTE — ED Provider Notes (Signed)
Medical screening examination/treatment/procedure(s) were performed by non-physician practitioner and as supervising physician I was immediately available for consultation/collaboration.  EKG Interpretation   None         Mirna Mires, MD 11/18/12 1651

## 2013-02-18 ENCOUNTER — Ambulatory Visit (INDEPENDENT_AMBULATORY_CARE_PROVIDER_SITE_OTHER): Payer: BC Managed Care – PPO | Admitting: Emergency Medicine

## 2013-02-18 VITALS — BP 134/84 | HR 110 | Temp 98.7°F | Resp 17 | Ht 65.5 in | Wt 178.0 lb

## 2013-02-18 DIAGNOSIS — J209 Acute bronchitis, unspecified: Secondary | ICD-10-CM

## 2013-02-18 DIAGNOSIS — N189 Chronic kidney disease, unspecified: Secondary | ICD-10-CM

## 2013-02-18 DIAGNOSIS — I10 Essential (primary) hypertension: Secondary | ICD-10-CM

## 2013-02-18 LAB — COMPREHENSIVE METABOLIC PANEL
ALT: 13 U/L (ref 0–35)
AST: 14 U/L (ref 0–37)
Albumin: 3.7 g/dL (ref 3.5–5.2)
Alkaline Phosphatase: 61 U/L (ref 39–117)
BUN: 34 mg/dL — ABNORMAL HIGH (ref 6–23)
CO2: 25 mEq/L (ref 19–32)
Calcium: 8.9 mg/dL (ref 8.4–10.5)
Chloride: 107 mEq/L (ref 96–112)
Creat: 1.84 mg/dL — ABNORMAL HIGH (ref 0.50–1.10)
Glucose, Bld: 93 mg/dL (ref 70–99)
Potassium: 4.4 mEq/L (ref 3.5–5.3)
Sodium: 140 mEq/L (ref 135–145)
Total Bilirubin: 0.2 mg/dL (ref 0.2–1.2)
Total Protein: 6.8 g/dL (ref 6.0–8.3)

## 2013-02-18 LAB — POCT CBC
Granulocyte percent: 57.9 %G (ref 37–80)
HCT, POC: 40.7 % (ref 37.7–47.9)
Hemoglobin: 12.4 g/dL (ref 12.2–16.2)
Lymph, poc: 2.3 (ref 0.6–3.4)
MCH, POC: 30.4 pg (ref 27–31.2)
MCHC: 30.5 g/dL — AB (ref 31.8–35.4)
MCV: 99.8 fL — AB (ref 80–97)
MID (cbc): 0.4 (ref 0–0.9)
MPV: 9 fL (ref 0–99.8)
POC Granulocyte: 3.6 (ref 2–6.9)
POC LYMPH PERCENT: 36.2 %L (ref 10–50)
POC MID %: 5.9 %M (ref 0–12)
Platelet Count, POC: 298 10*3/uL (ref 142–424)
RBC: 4.08 M/uL (ref 4.04–5.48)
RDW, POC: 14 %
WBC: 6.3 10*3/uL (ref 4.6–10.2)

## 2013-02-18 MED ORDER — AZITHROMYCIN 250 MG PO TABS: ORAL_TABLET | ORAL | Status: DC

## 2013-02-18 MED ORDER — CHLORPHENIRAMINE-ACETAMINOPHEN 2-325 MG PO TABS: 1.0000 | ORAL_TABLET | ORAL | Status: DC | PRN

## 2013-02-18 MED ORDER — HYDROCOD POLST-CHLORPHEN POLST 10-8 MG/5ML PO LQCR
5.0000 mL | Freq: Two times a day (BID) | ORAL | Status: DC | PRN
Start: 2013-02-18 — End: 2013-03-03

## 2013-02-18 NOTE — Progress Notes (Signed)
[facility] [address], [place] [address] [phone]  Date:  02/18/2013   Name:  Kristen Moreno   DOB:  10/29/1960   MRN:  [id number]  PCP:  No PCP Per Patient    Chief Complaint: Hypertension   History of Present Illness:  Kristen Moreno is a 53 y.o. very pleasant female patient who presents with the following:  History of hypertension and has been out of her medication for 3-4 months.  Now  Has nasal congestion, drainage and a sore throat and cough since last weekend.  (7 days).  Now has a cough productive of purulent sputum.  No wheezing or shortness of breath.  No nausea or vomiting.  No stool change or rash.  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  She says she has experienced a bifrontal headache for the past 9-12 months and has stopped taking it due to progressive renal insufficiency.  No improvement with over the counter medications or other home remedies.  Denies other complaint or health concern today.    Patient Active Problem List   Diagnosis Date Noted  . Polycystic kidney disease 12/16/2011  . Chronic renal insufficiency 12/16/2011  . Hypertension 12/16/2011  . S/P hysterectomy 12/16/2011  . Hot flashes 12/16/2011    Past Medical History  Diagnosis Date  . Hypertension   . Ovarian cyst   . Polycystic disease, ovaries   . Depression     no meds  . GERD (gastroesophageal reflux disease)   . Polycystic kidney disease     1998    Past Surgical History  Procedure Laterality Date  . No past surgeries    . Bladder suspension  08/11/2011    Procedure: TRANSVAGINAL TAPE (TVT) PROCEDURE;  Surgeon: Emily Filbert, MD;  Location: [facility];  Service: Gynecology;  Laterality: N/A;  Add:  Cystoscopy  . Abdominal hysterectomy  2013    History  Substance Use Topics  . Smoking status: Current Every Day Smoker -- 0.50 packs/day for .5 years    Types: Cigarettes  . Smokeless tobacco: Former  Systems developer    Quit date: 07/31/2011  . Alcohol Use: No    Family History  Problem Relation Age of Onset  . Asthma Mother   . Hypertension Mother   . Asthma Father   . Hypertension Father     Allergies  Allergen Reactions  . Ace Inhibitors Cough    Medication list has been reviewed and updated.  Current Outpatient Prescriptions on File Prior to Visit  Medication Sig Dispense Refill  . Aspirin-Salicylamide-Caffeine (BC HEADACHE PO) Take 1 packet by mouth daily as needed (headache / knee pain).      Marland Kitchen HYDROcodone-acetaminophen (NORCO/VICODIN) 5-325 MG per tablet Take 1-2 tablets by mouth every 6 (six) hours as needed for pain.  20 tablet  0  . naproxen (NAPROSYN) 500 MG tablet Take 1 tablet (500 mg total) by mouth 2 (two) times daily.  30 tablet  0   No current facility-administered medications on file prior to visit.    Review of Systems:  As per HPI, otherwise negative.    Physical Examination: Filed Vitals:   02/18/13 1457  BP: 134/84  Pulse: 110  Temp: 98.7 F (37.1 C)  Resp: 17   Filed Vitals:   02/18/13 1457  Height: 5' 5.5" (1.664 m)  Weight: 178 lb (80.74 kg)   Body mass index is 29.16 kg/(m^2). Ideal Body Weight: Weight in (lb) to  have BMI = 25: 152.2  GEN: WDWN, NAD, Non-toxic, A & O x 3 HEENT: Atraumatic, Normocephalic. Neck supple. No masses, No LAD. Ears and Nose: No external deformity. CV: RRR, No M/G/R. No JVD. No thrill. No extra heart sounds. PULM: CTA B, no wheezes, crackles, rhonchi. No retractions. No resp. distress. No accessory muscle use. ABD: S, NT, ND, +BS. No rebound. No HSM. EXTR: No c/c/e NEURO Normal gait.  PSYCH: Normally interactive. Conversant. Not depressed or anxious appearing.  Calm demeanor.    Assessment and Plan: Bronchitis CKD Hypertension Labs Hold BP meds zpak Coricidin HBP tussionex  Dr Leward Quan in one month Signed,  Ellison Carwin, MD

## 2013-02-18 NOTE — Patient Instructions (Signed)

## 2013-02-22 NOTE — Progress Notes (Signed)
Appointment made 2/19 for a physical with Dr Leward Quan.

## 2013-03-03 ENCOUNTER — Ambulatory Visit (INDEPENDENT_AMBULATORY_CARE_PROVIDER_SITE_OTHER): Payer: BC Managed Care – PPO | Admitting: Family Medicine

## 2013-03-03 ENCOUNTER — Encounter: Payer: Self-pay | Admitting: Family Medicine

## 2013-03-03 VITALS — BP 156/90 | HR 77 | Temp 98.3°F | Resp 16 | Ht 65.5 in | Wt 178.2 lb

## 2013-03-03 DIAGNOSIS — N949 Unspecified condition associated with female genital organs and menstrual cycle: Secondary | ICD-10-CM

## 2013-03-03 DIAGNOSIS — Z90710 Acquired absence of both cervix and uterus: Secondary | ICD-10-CM

## 2013-03-03 DIAGNOSIS — N189 Chronic kidney disease, unspecified: Secondary | ICD-10-CM

## 2013-03-03 DIAGNOSIS — R102 Pelvic and perineal pain: Secondary | ICD-10-CM

## 2013-03-03 DIAGNOSIS — I10 Essential (primary) hypertension: Secondary | ICD-10-CM

## 2013-03-03 DIAGNOSIS — Q613 Polycystic kidney, unspecified: Secondary | ICD-10-CM

## 2013-03-03 DIAGNOSIS — G8929 Other chronic pain: Secondary | ICD-10-CM

## 2013-03-03 DIAGNOSIS — Z1159 Encounter for screening for other viral diseases: Secondary | ICD-10-CM

## 2013-03-03 LAB — T3, FREE: T3, Free: 2.8 pg/mL (ref 2.3–4.2)

## 2013-03-03 LAB — CA 125: CA 125: 4 U/mL (ref 0.0–30.2)

## 2013-03-03 LAB — TSH: TSH: 2.299 u[IU]/mL (ref 0.350–4.500)

## 2013-03-03 LAB — HEPATITIS C ANTIBODY: HCV Ab: NEGATIVE

## 2013-03-03 LAB — T4, FREE: Free T4: 1.04 ng/dL (ref 0.80–1.80)

## 2013-03-03 MED ORDER — AMLODIPINE BESYLATE 10 MG PO TABS: 10.0000 mg | ORAL_TABLET | Freq: Every day | ORAL | Status: DC

## 2013-03-03 NOTE — Progress Notes (Signed)
Quick Note:  Please notify pt that results are normal.   Provide pt with copy of labs. ______ 

## 2013-03-03 NOTE — Progress Notes (Signed)
Subjective:    Patient ID: Kristen Moreno, female    DOB: 19-Aug-1960, 53 y.o.   MRN: [id number]  HPI  This 53 y.o. AA female is here w/ her daughter, Kristen Moreno, for treatment of HTN, diagnosed > 3 years ago. Previous medical care at [facility]; medication distributed from pharmacy there. When that facility closed, pt had no insurance and thus has been w/o medication for several months. She has hx of polycystic kidney disease w/ renal insufficiency (Nov 2013 Renal US).   Pt was evaluated for abd pain by Dr. Collene Mares and underwent colonoscopy yesterday. I spoke w/ Dr. Collene Mares about pt's elevated BP. Per Dr. Collene Mares, colon is normal. Pt has persistent pelvic pan today; she is s/p TAH for fibroids. Ovaries are intact; pre-op pelvis US in April 2013 showed normal ovaries.  Patient Active Problem List   Diagnosis Date Noted  . Polycystic kidney disease 12/16/2011  . Chronic renal insufficiency 12/16/2011  . Hypertension 12/16/2011  . S/P hysterectomy 12/16/2011  . Hot flashes 12/16/2011   PMHX, surg Hx, Soc and Fam Hx reviewed. Medications reconciled.  Review of Systems  Constitutional: Positive for fatigue. Negative for fever, chills, diaphoresis, appetite change and unexpected weight change.  Eyes: Negative.   Respiratory: Negative for cough, chest tightness and shortness of breath.   Cardiovascular: Negative.   Gastrointestinal: Positive for abdominal pain and abdominal distention. Negative for nausea, diarrhea, constipation, blood in stool and rectal pain.  Endocrine: Negative.   Genitourinary: Positive for pelvic pain. Negative for dysuria, flank pain, vaginal bleeding, vaginal discharge and vaginal pain.  Skin: Negative.   Neurological: Positive for dizziness and headaches. Negative for syncope, speech difficulty, weakness and numbness.  Hematological: Negative.   Psychiatric/Behavioral: The patient is nervous/anxious.        Objective:   Physical Exam  Nursing note and  vitals reviewed. Constitutional: She is oriented to person, place, and time. Vital signs are normal. She appears well-developed and well-nourished. No distress.  HENT:  Head: Normocephalic and atraumatic.  Right Ear: Hearing, tympanic membrane, external ear and ear canal normal.  Left Ear: Hearing, tympanic membrane, external ear and ear canal normal.  Nose: Nose normal. No nasal deformity or septal deviation.  Mouth/Throat: Uvula is midline, oropharynx is clear and moist and mucous membranes are normal.  Eyes: Conjunctivae and EOM are normal. Pupils are equal, round, and reactive to light. No scleral icterus.  Neck: Normal range of motion. Neck supple. No tracheal deviation present. No thyromegaly present.  Cardiovascular: Normal rate, regular rhythm, S1 normal, S2 normal, normal heart sounds and normal pulses.   No extrasystoles are present. PMI is not displaced.  Exam reveals no gallop and no friction rub.   No murmur heard. Pulmonary/Chest: Effort normal. No respiratory distress. She has decreased breath sounds. She has no wheezes. She has no rhonchi.  Decreased BS at bases due to insufficient inspiratory effort.  Abdominal: Soft. Normal appearance, normal aorta and bowel sounds are normal. She exhibits distension. She exhibits no abdominal bruit and no mass. There is no hepatosplenomegaly. There is tenderness in the left lower quadrant. There is no rigidity, no rebound, no guarding and no CVA tenderness.  Musculoskeletal: Normal range of motion. She exhibits no edema and no tenderness.  Lymphadenopathy:    She has no cervical adenopathy.  Neurological: She is alert and oriented to person, place, and time. No cranial nerve deficit. Coordination normal.  Reflex Scores:      Tricep reflexes are 2+ on the  right side and 2+ on the left side.      Bicep reflexes are 2+ on the right side and 2+ on the left side.      Patellar reflexes are 2+ on the right side and 2+ on the left side. Brisk  reflexes.  Skin: Skin is warm, dry and intact. No ecchymosis and no rash noted. She is not diaphoretic. No cyanosis or erythema.  Psychiatric: Her speech is normal and behavior is normal. Judgment and thought content normal. Her mood appears anxious. Her affect is not angry, not labile and not inappropriate. Cognition and memory are impaired. She does not exhibit a depressed mood.       Assessment & Plan:  Hypertension - Review of record shows pt was taking Amlodipine 10 mg 1 tab daily. Resume this medication.  Plan: TSH, T3, Free, T4, Free  Chronic renal insufficiency- Will recheck renal function in near future; consider 24-hour urine collection for CrCl and protein in advance of referral to Nephrology.   Chronic female pelvic pain - Will do pelvic exam at next visit.     Plan: CA 125  Need for hepatitis C screening test - Plan: Hepatitis C antibody  Meds ordered this encounter  Medications  . amLODipine (NORVASC) 10 MG tablet    Sig: Take 1 tablet (10 mg total) by mouth daily.    Dispense:  30 tablet    Refill:  3

## 2013-03-03 NOTE — Patient Instructions (Signed)
Hypertension As your heart beats, it forces blood through your arteries. This force is your blood pressure. If the pressure is too high, it is called hypertension (HTN) or high blood pressure. HTN is dangerous because you may have it and not know it. High blood pressure may mean that your heart has to work harder to pump blood. Your arteries may be narrow or stiff. The extra work puts you at risk for heart disease, stroke, and other problems.  Blood pressure consists of two numbers, a higher number over a lower, 110/72, for example. It is stated as "110 over 72." The ideal is below 120 for the top number (systolic) and under 80 for the bottom (diastolic). Write down your blood pressure today. You should pay close attention to your blood pressure if you have certain conditions such as:  Heart failure.  Prior heart attack.  Diabetes  Chronic kidney disease.  Prior stroke.  Multiple risk factors for heart disease. To see if you have HTN, your blood pressure should be measured while you are seated with your arm held at the level of the heart. It should be measured at least twice. A one-time elevated blood pressure reading (especially in the Emergency Department) does not mean that you need treatment. There may be conditions in which the blood pressure is different between your right and left arms. It is important to see your caregiver soon for a recheck. Most people have essential hypertension which means that there is not a specific cause. This type of high blood pressure may be lowered by changing lifestyle factors such as:  Stress.  Smoking.  Lack of exercise.  Excessive weight.  Drug/tobacco/alcohol use.  Eating less salt. Most people do not have symptoms from high blood pressure until it has caused damage to the body. Effective treatment can often prevent, delay or reduce that damage. TREATMENT  When a cause has been identified, treatment for high blood pressure is directed at the  cause. There are a large number of medications to treat HTN. These fall into several categories, and your caregiver will help you select the medicines that are best for you. Medications may have side effects. You should review side effects with your caregiver. If your blood pressure stays high after you have made lifestyle changes or started on medicines,   Your medication(s) may need to be changed.  Other problems may need to be addressed.  Be certain you understand your prescriptions, and know how and when to take your medicine.  Be sure to follow up with your caregiver within the time frame advised (usually within two weeks) to have your blood pressure rechecked and to review your medications.  If you are taking more than one medicine to lower your blood pressure, make sure you know how and at what times they should be taken. Taking two medicines at the same time can result in blood pressure that is too low. SEEK IMMEDIATE MEDICAL CARE IF:  You develop a severe headache, blurred or changing vision, or confusion.  You have unusual weakness or numbness, or a faint feeling.  You have severe chest or abdominal pain, vomiting, or breathing problems. MAKE SURE YOU:   Understand these instructions.  Will watch your condition.  Will get help right away if you are not doing well or get worse. Document Released: 01/06/2005 Document Revised: 03/31/2011 Document Reviewed: 08/27/2007 [facility] Patient Information 2014 [place].   I have prescribed Amlodipine for controlling your blodd pressure. You will return for recheck  in 2 weeks. I spoke with Dr. Collene Mares about your colonoscopy; she said it was normal. I need to focus on evaluating your pelvic pain to find out if you have some problem with your ovaries.

## 2013-03-10 ENCOUNTER — Encounter: Payer: BC Managed Care – PPO | Admitting: Family Medicine

## 2013-03-17 ENCOUNTER — Ambulatory Visit: Payer: BC Managed Care – PPO | Admitting: Family Medicine

## 2013-04-14 ENCOUNTER — Ambulatory Visit (INDEPENDENT_AMBULATORY_CARE_PROVIDER_SITE_OTHER): Payer: BC Managed Care – PPO | Admitting: Family Medicine

## 2013-04-14 ENCOUNTER — Encounter: Payer: Self-pay | Admitting: Family Medicine

## 2013-04-14 VITALS — BP 156/92 | HR 78 | Resp 16 | Ht 65.5 in | Wt 173.0 lb

## 2013-04-14 DIAGNOSIS — Z0000 Encounter for general adult medical examination without abnormal findings: Secondary | ICD-10-CM

## 2013-04-14 DIAGNOSIS — G8929 Other chronic pain: Secondary | ICD-10-CM

## 2013-04-14 DIAGNOSIS — M25569 Pain in unspecified knee: Secondary | ICD-10-CM

## 2013-04-14 DIAGNOSIS — Z1231 Encounter for screening mammogram for malignant neoplasm of breast: Secondary | ICD-10-CM

## 2013-04-14 DIAGNOSIS — I10 Essential (primary) hypertension: Secondary | ICD-10-CM

## 2013-04-14 DIAGNOSIS — Q613 Polycystic kidney, unspecified: Secondary | ICD-10-CM

## 2013-04-14 DIAGNOSIS — M25562 Pain in left knee: Secondary | ICD-10-CM

## 2013-04-14 DIAGNOSIS — Z23 Encounter for immunization: Secondary | ICD-10-CM

## 2013-04-14 LAB — LIPID PANEL
Cholesterol: 254 mg/dL — ABNORMAL HIGH (ref 0–200)
HDL: 50 mg/dL (ref >39)
LDL Cholesterol: 186 mg/dL — ABNORMAL HIGH (ref 0–99)
Total CHOL/HDL Ratio: 5.1 Ratio
Triglycerides: 89 mg/dL (ref <150)
VLDL: 18 mg/dL (ref 0–40)

## 2013-04-14 LAB — POCT URINALYSIS DIPSTICK
Bilirubin, UA: NEGATIVE
Blood, UA: NEGATIVE
Glucose, UA: NEGATIVE
Ketones, UA: NEGATIVE
Leukocytes, UA: NEGATIVE
Nitrite, UA: NEGATIVE
Protein, UA: 100
Spec Grav, UA: 1.02
Urobilinogen, UA: 0.2
pH, UA: 5.5

## 2013-04-14 LAB — BASIC METABOLIC PANEL
BUN: 30 mg/dL — ABNORMAL HIGH (ref 6–23)
CO2: 21 mEq/L (ref 19–32)
Calcium: 8.9 mg/dL (ref 8.4–10.5)
Chloride: 109 mEq/L (ref 96–112)
Creat: 1.63 mg/dL — ABNORMAL HIGH (ref 0.50–1.10)
Glucose, Bld: 84 mg/dL (ref 70–99)
Potassium: 4.3 mEq/L (ref 3.5–5.3)
Sodium: 140 mEq/L (ref 135–145)

## 2013-04-14 LAB — POCT UA - MICROSCOPIC ONLY
Casts, Ur, LPF, POC: NEGATIVE
Crystals, Ur, HPF, POC: NEGATIVE
Mucus, UA: POSITIVE
Yeast, UA: NEGATIVE

## 2013-04-14 MED ORDER — AMLODIPINE BESYLATE 10 MG PO TABS: 10.0000 mg | ORAL_TABLET | Freq: Every day | ORAL | Status: DC

## 2013-04-14 MED ORDER — TRAMADOL HCL 50 MG PO TABS: 50.0000 mg | ORAL_TABLET | Freq: Two times a day (BID) | ORAL | Status: DC

## 2013-04-14 NOTE — Progress Notes (Signed)
Subjective:    Patient ID: Kristen Moreno, female    DOB: 27-Dec-1960, 53 y.o.   MRN: [id number]  HPI  This 53 y.o. AA female is here for CPE (PAP- 06/2011 negative- GYN is Dr. Clovia Cuff). Pt has HTN treated w/ Amlodipine w/o adverse effects; she has been out of medication for several days. Known to have Polycystic Kidney Disease.  HCM: MMG- July 2013 (negative); needs to be scheduled.           IMM- Tdap needed; declines Flu vaccine.           CRS- Jan 2015 (normal per pt).  Patient Active Problem List   Diagnosis Date Noted  . Polycystic kidney disease 12/16/2011  . Chronic renal insufficiency 12/16/2011  . Hypertension 12/16/2011  . S/P hysterectomy 12/16/2011  . Hot flashes 12/16/2011   Prior to Admission medications   Medication Sig Start Date End Date Taking? Authorizing Provider  amLODipine (NORVASC) 10 MG tablet Take 1 tablet (10 mg total) by mouth daily.    Barton Fanny, MD  traMADol (ULTRAM) 50 MG tablet Take 1 tablet (50 mg total) by mouth 2 (two) times daily.    Barton Fanny, MD   PMHx, Surg Hx, Soc and Fam Hx reviewed.   Review of Systems  Endocrine: Positive for heat intolerance.  Genitourinary: Positive for vaginal pain. Negative for dysuria, urgency, frequency, hematuria, vaginal discharge, difficulty urinating and pelvic pain.  Musculoskeletal: Positive for joint swelling.       Chronic pain and swelling behind left knee.  All other systems reviewed and are negative.      Objective:   Physical Exam  Nursing note and vitals reviewed. Constitutional: She is oriented to person, place, and time. She appears well-developed and well-nourished. No distress.  HENT:  Head: Normocephalic and atraumatic.  Right Ear: Hearing, tympanic membrane, external ear and ear canal normal.  Left Ear: Hearing, tympanic membrane, external ear and ear canal normal.  Nose: Nose normal. No nasal deformity or septal deviation.  Mouth/Throat: Uvula is midline,  oropharynx is clear and moist and mucous membranes are normal. No oral lesions. Normal dentition.  Eyes: Conjunctivae, EOM and lids are normal. Pupils are equal, round, and reactive to light. No scleral icterus.  Fundoscopic exam:      The right eye shows arteriolar narrowing. The right eye shows no papilledema. The right eye shows red reflex.       The left eye shows arteriolar narrowing. The left eye shows no papilledema. The left eye shows red reflex.  Neck: Trachea normal, normal range of motion and full passive range of motion without pain. Neck supple. No JVD present. No spinous process tenderness and no muscular tenderness present. Carotid bruit is not present. No mass and no thyromegaly present.  Cardiovascular: Normal rate, regular rhythm, S1 normal, S2 normal, normal heart sounds and normal pulses.   No extrasystoles are present. PMI is not displaced.  Exam reveals no gallop and no friction rub.   No murmur heard. Pulses:      Radial pulses are 2+ on the right side, and 2+ on the left side.       Femoral pulses are 2+ on the right side, and 2+ on the left side.      Dorsalis pedis pulses are 2+ on the right side, and 2+ on the left side.  Pulmonary/Chest: Effort normal and breath sounds normal. No respiratory distress. Right breast exhibits no inverted nipple, no mass, no nipple discharge,  no skin change and no tenderness. Left breast exhibits no inverted nipple, no mass, no nipple discharge, no skin change and no tenderness. Breasts are symmetrical.  Abdominal: Soft. Normal appearance and bowel sounds are normal. She exhibits no distension, no pulsatile midline mass and no mass. There is no hepatosplenomegaly. There is no tenderness. There is no guarding and no CVA tenderness.  Genitourinary:  NEFG; PAP/pelvic/rectal deferred.  Musculoskeletal:       Right knee: Normal.       Left knee: She exhibits decreased range of motion and swelling. She exhibits no deformity and no erythema.  Tenderness found.       Cervical back: Normal.       Thoracic back: Normal.       Lumbar back: Normal.  L knee- fullness and tenderness in popliteal space. Remainder of MS exam unremarkable.  Lymphadenopathy:       Head (right side): No submental, no submandibular, no tonsillar, no posterior auricular and no occipital adenopathy present.       Head (left side): No submental, no submandibular, no tonsillar, no posterior auricular and no occipital adenopathy present.    She has no cervical adenopathy.    She has no axillary adenopathy.       Right: No inguinal and no supraclavicular adenopathy present.       Left: No inguinal and no supraclavicular adenopathy present.  Neurological: She is alert and oriented to person, place, and time. She has normal strength and normal reflexes. She displays no atrophy and no tremor. No cranial nerve deficit or sensory deficit. She exhibits normal muscle tone. Coordination and gait normal.  Skin: Skin is warm, dry and intact. No bruising, no lesion and no rash noted. She is not diaphoretic. No cyanosis or erythema. Nails show no clubbing.  Psychiatric: She has a normal mood and affect. Her speech is normal and behavior is normal. Judgment and thought content normal. Cognition and memory are normal.       Assessment & Plan:  Routine general medical examination at a health care facility - Plan: Creatinine, Urine, 24 Hour, Basic metabolic panel, POCT UA - Microscopic Only, POCT urinalysis dipstick, Creatinine, Urine, 24 Hour, Lipid panel  Hypertension - Continue Amlodipine; needs referral to Nephrology re: Polycystic Kidney Disease. Plan: Creatinine, Urine, 24 Hour, Basic metabolic panel, Creatinine, Urine, 24 Hour, Lipid panel  Polycystic kidney disease - Plan: Creatinine, Urine, 24 Hour, POCT UA - Microscopic Only, POCT urinalysis dipstick, Creatinine, Urine, 24 Hour, US Renal  Chronic pain of left knee - Plan: Ambulatory referral to Orthopedic Surgery; RX:  Tramadol  Other screening mammogram - Plan: MM Digital Screening  Depression screening score = 12; will discuss this in more detail w/ pt at follow-up visit.  Meds ordered this encounter  Medications  . amLODipine (NORVASC) 10 MG tablet    Sig: Take 1 tablet (10 mg total) by mouth daily.    Dispense:  30 tablet    Refill:  5  . traMADol (ULTRAM) 50 MG tablet    Sig: Take 1 tablet (50 mg total) by mouth 2 (two) times daily.    Dispense:  40 tablet    Refill:  1

## 2013-04-14 NOTE — Patient Instructions (Addendum)
Polycystic Kidney Disease Polycystic kidney disease is a disease in which the kidneys grow many small fluid-filled cysts. These cysts squeeze healthy kidney tissue, hurting the function of the kidneys. Polycystic kidney disease is present at birth and often causes progressive loss of kidney function and high blood pressure (hypertension). In milder forms of the disease, kidney function may be adequate throughout life.  CAUSES  The condition is usually inherited from parents. SIGNS AND SYMPTOMS  Hypertension.   Not enough healthy red blood cells to carry adequate oxygen to your tissues (anemia).   Pain in middle and side of the back below ribs (flank pain). This can happen if a cyst is bleeding.   Blood in the urine.   Kidney failure.   Kidney stones.   Increased urination at night.   Liver disease and liver cysts.  DIAGNOSIS  Your health care provider will ask you about your history and symptoms and perform a physical exam. Tests may be done to diagnose the condition. They may include an ultrasound, CT scan, or an MRI scan. Sometimes chromosome studies are done to see if you have the genes to have polycystic kidneys.  TREATMENT   There is no treatment at this time to keep cysts from forming or getting larger.  Cysts may need to be drained with a needle if they are large, putting pressure on other organs, and further destroying kidney tissue. This may require surgery.  Hypertension may be treated with medicines. Treating hypertension slows down further damage to the kidneys.  If kidney failure occurs, dialysis or transplantation is used to treat the disease. HOME CARE INSTRUCTIONS You should follow up with a kidney specialist (nephrologist) so that your kidney function is monitored. SEEK MEDICAL CARE IF:  You have severe pain in the flank area.  You have blood in your urine. Document Released: 10/01/2000 Document Revised: 09/08/2012 Document Reviewed:  06/10/2012 [facility] Patient Information 2014 [place].    Keeping You Healthy  Get These Tests  Blood Pressure- Have your blood pressure checked by your healthcare provider at least once a year.  Normal blood pressure is 120/80.  Weight- Have your body mass index (BMI) calculated to screen for obesity.  BMI is a measure of body fat based on height and weight.  You can calculate your own BMI at [employer]  Cholesterol- Have your cholesterol checked every year.  Diabetes- Have your blood sugar checked every year if you have high blood pressure, high cholesterol, a family history of diabetes or if you are overweight.  Pap Smear- Have a pap smear every 1 to 3 years if you have been sexually active.  If you are older than 65 and recent pap smears have been normal you may not need additional pap smears.  In addition, if you have had a hysterectomy  For benign disease additional pap smears are not necessary.  Mammogram-Yearly mammograms are essential for early detection of breast cancer  Screening for Colon Cancer- Colonoscopy starting at age 46. Screening may begin sooner depending on your family history and other health conditions.  Follow up colonoscopy as directed by your Gastroenterologist.  Screening for Osteoporosis- Screening begins at age 67 with bone density scanning, sooner if you are at higher risk for developing Osteoporosis.  Get these medicines  Calcium with Vitamin D- Your body requires 1200-1500 mg of Calcium a day and [phone] IU of Vitamin D a day.  You can only absorb 500 mg of Calcium at a time therefore Calcium must be taken  in 2 or 3 separate doses throughout the day.  Hormones- Hormone therapy has been associated with increased risk for certain cancers and heart disease.  Talk to your healthcare provider about if you need relief from menopausal symptoms.  Aspirin- Ask your healthcare provider about taking Aspirin to prevent Heart Disease and  Stroke.  Get these Immuniztions  Flu shot- Every fall  Pneumonia shot- Once after the age of 64; if you are younger ask your healthcare provider if you need a pneumonia shot.  Tetanus- Every ten years. Tdap was given today.  Zostavax- Once after the age of 63 to prevent shingles.  Take these steps  Don't smoke- Your healthcare provider can help you quit. For tips on how to quit, ask your healthcare provider or go to www.smokefree.gov or call 1-800 QUIT-NOW.  Be physically active- Exercise 5 days a week for a minimum of 30 minutes.  If you are not already physically active, start slow and gradually work up to 30 minutes of moderate physical activity.  Try walking, dancing, bike riding, swimming, etc.  Eat a healthy diet- Eat a variety of healthy foods such as fruits, vegetables, whole grains, low fat milk, low fat cheeses, yogurt, lean meats, chicken, fish, eggs, dried beans, tofu, etc.  For more information go to www.thenutritionsource.org  Dental visit- Brush and floss teeth twice daily; visit your dentist twice a year.  Eye exam- Visit your Optometrist or Ophthalmologist yearly.  Drink alcohol in moderation- Limit alcohol intake to one drink or less a day.  Never drink and drive.  Depression- Your emotional health is as important as your physical health.  If you're feeling down or losing interest in things you normally enjoy, please talk to your healthcare provider.  Seat Belts- can save your life; always wear one  Smoke/Carbon Monoxide detectors- These detectors need to be installed on the appropriate level of your home.  Replace batteries at least once a year.  Violence- If anyone is threatening or hurting you, please tell your healthcare provider.  Living Will/ Health care power of attorney- Discuss with your healthcare provider and family.   I have placed an order for referral to ORTHOPEDICS to check your knee and treat the problem.  You will be instructed about the  24-hour urine collection needed prior to referral to NEPHROLOGY (kidney specialist) regarding Polycystic Kidney Disease.  A staff member will call you with details about the renal ultrasound; you last had this imaging study in 2010.

## 2013-04-15 NOTE — Progress Notes (Signed)
Quick Note:  Please advise pt regarding following labs...  Cholesterol numbers (total and LDL- "bad") are above normal. Heart disease risk is above normal based on these numbers. We need to recheck these numbers in 6 months. Follow a "Heart healthy" diet and stay active for weight loss. Get a Fish Oil supplement 1200 mg and take 1 capsule daily. Do not take the fish oil with other solid pills or capsules.  Kidney function is slightly improved compared to 1 month ago. Limit protein (meat) in your diet and stay well hydrated (mostly with water).  Copy to pt. ______

## 2013-04-16 ENCOUNTER — Encounter: Payer: Self-pay | Admitting: Family Medicine

## 2013-04-18 LAB — CREATININE, URINE, 24 HOUR
Creatinine, 24H Ur: 1617 mg/d (ref 700–1800)
Creatinine, Urine: 73.5 mg/dL

## 2013-04-19 NOTE — Progress Notes (Signed)
Quick Note:  Please advise pt regarding following labs... The results of the urine tests from the 24- hour urine collection are normal. Compared to 4 years ago, you have less creatinine in your urine. There is a normal amount of this substance in your urine over a 24- hour period.  I am waiting the results of the kidney ultrasound.  Copy to pt. ______

## 2013-04-20 ENCOUNTER — Telehealth: Payer: Self-pay | Admitting: Family Medicine

## 2013-04-20 ENCOUNTER — Ambulatory Visit
Admission: RE | Admit: 2013-04-20 | Discharge: 2013-04-20 | Disposition: A | Discharge status: Home / Self Care | Payer: BC Managed Care – PPO | Source: Ambulatory Visit | Attending: Family Medicine | Admitting: Family Medicine

## 2013-04-20 DIAGNOSIS — Q613 Polycystic kidney, unspecified: Secondary | ICD-10-CM

## 2013-04-20 NOTE — Telephone Encounter (Signed)
Pt informed of normal 24-hour urine result. She understood.

## 2013-04-21 NOTE — Progress Notes (Signed)
Quick Note:  Your recent imaging study (renal ultrasound) is unchanged from 5 years ago. The cysts are visible and there are no solid masses or other abnormalities. ______

## 2013-06-25 ENCOUNTER — Encounter (HOSPITAL_COMMUNITY): Payer: Self-pay | Admitting: Emergency Medicine

## 2013-06-25 ENCOUNTER — Emergency Department (HOSPITAL_COMMUNITY)
Admission: EM | Admit: 2013-06-25 | Discharge: 2013-06-26 | Disposition: A | Discharge status: Home / Self Care | Payer: BC Managed Care – PPO | Attending: Emergency Medicine | Admitting: Emergency Medicine

## 2013-06-25 DIAGNOSIS — Z8719 Personal history of other diseases of the digestive system: Secondary | ICD-10-CM | POA: Insufficient documentation

## 2013-06-25 DIAGNOSIS — F17200 Nicotine dependence, unspecified, uncomplicated: Secondary | ICD-10-CM | POA: Insufficient documentation

## 2013-06-25 DIAGNOSIS — Z87448 Personal history of other diseases of urinary system: Secondary | ICD-10-CM | POA: Insufficient documentation

## 2013-06-25 DIAGNOSIS — I129 Hypertensive chronic kidney disease with stage 1 through stage 4 chronic kidney disease, or unspecified chronic kidney disease: Secondary | ICD-10-CM | POA: Insufficient documentation

## 2013-06-25 DIAGNOSIS — Z8659 Personal history of other mental and behavioral disorders: Secondary | ICD-10-CM | POA: Insufficient documentation

## 2013-06-25 DIAGNOSIS — R109 Unspecified abdominal pain: Secondary | ICD-10-CM

## 2013-06-25 DIAGNOSIS — Z8742 Personal history of other diseases of the female genital tract: Secondary | ICD-10-CM | POA: Insufficient documentation

## 2013-06-25 DIAGNOSIS — K460 Unspecified abdominal hernia with obstruction, without gangrene: Secondary | ICD-10-CM | POA: Insufficient documentation

## 2013-06-25 DIAGNOSIS — N189 Chronic kidney disease, unspecified: Secondary | ICD-10-CM

## 2013-06-25 DIAGNOSIS — Z79899 Other long term (current) drug therapy: Secondary | ICD-10-CM | POA: Insufficient documentation

## 2013-06-25 NOTE — ED Notes (Signed)
C/o "knot above umbilicus", first noticed ~4d ago, lifts boxes at work. Hardness noted. Describes pain as constant, TTP. (denies: fever, dizziness, nvd, urinary sx, vaginal sx, bleeding or back pain). No meds PTA. Took tramadol for pain last night.

## 2013-06-26 ENCOUNTER — Encounter (HOSPITAL_COMMUNITY): Payer: Self-pay | Admitting: Radiology

## 2013-06-26 ENCOUNTER — Emergency Department (HOSPITAL_COMMUNITY): Payer: BC Managed Care – PPO

## 2013-06-26 LAB — CBC WITH DIFFERENTIAL/PLATELET
Basophils Absolute: 0.1 K/uL (ref 0.0–0.1)
Basophils Relative: 1 % (ref 0–1)
Eosinophils Absolute: 0.3 K/uL (ref 0.0–0.7)
Eosinophils Relative: 4 % (ref 0–5)
HCT: 36.8 % (ref 36.0–46.0)
Hemoglobin: 12.1 g/dL (ref 12.0–15.0)
Lymphocytes Relative: 42 % (ref 12–46)
Lymphs Abs: 3.2 K/uL (ref 0.7–4.0)
MCH: 30.8 pg (ref 26.0–34.0)
MCHC: 32.9 g/dL (ref 30.0–36.0)
MCV: 93.6 fL (ref 78.0–100.0)
Monocytes Absolute: 0.4 K/uL (ref 0.1–1.0)
Monocytes Relative: 5 % (ref 3–12)
Neutro Abs: 3.8 K/uL (ref 1.7–7.7)
Neutrophils Relative %: 48 % (ref 43–77)
Platelets: 268 K/uL (ref 150–400)
RBC: 3.93 MIL/uL (ref 3.87–5.11)
RDW: 13.9 % (ref 11.5–15.5)
WBC: 7.8 K/uL (ref 4.0–10.5)

## 2013-06-26 LAB — URINE MICROSCOPIC-ADD ON

## 2013-06-26 LAB — URINALYSIS, ROUTINE W REFLEX MICROSCOPIC
Bilirubin Urine: NEGATIVE
Glucose, UA: NEGATIVE mg/dL
Hgb urine dipstick: NEGATIVE
Ketones, ur: NEGATIVE mg/dL
Leukocytes, UA: NEGATIVE
Nitrite: NEGATIVE
Protein, ur: 30 mg/dL — AB
Specific Gravity, Urine: 1.013 (ref 1.005–1.030)
Urobilinogen, UA: 0.2 mg/dL (ref 0.0–1.0)
pH: 6 (ref 5.0–8.0)

## 2013-06-26 LAB — COMPREHENSIVE METABOLIC PANEL
ALT: 13 U/L (ref 0–35)
AST: 14 U/L (ref 0–37)
Albumin: 3.2 g/dL — ABNORMAL LOW (ref 3.5–5.2)
Alkaline Phosphatase: 64 U/L (ref 39–117)
BUN: 30 mg/dL — ABNORMAL HIGH (ref 6–23)
CO2: 24 mEq/L (ref 19–32)
Calcium: 8.9 mg/dL (ref 8.4–10.5)
Chloride: 108 mEq/L (ref 96–112)
Creatinine, Ser: 1.78 mg/dL — ABNORMAL HIGH (ref 0.50–1.10)
GFR calc Af Amer: 37 mL/min — ABNORMAL LOW (ref >90)
GFR calc non Af Amer: 32 mL/min — ABNORMAL LOW (ref >90)
Glucose, Bld: 120 mg/dL — ABNORMAL HIGH (ref 70–99)
Potassium: 4.1 mEq/L (ref 3.7–5.3)
Sodium: 144 mEq/L (ref 137–147)
Total Bilirubin: <0.2 mg/dL — ABNORMAL LOW (ref 0.3–1.2)
Total Protein: 7.1 g/dL (ref 6.0–8.3)

## 2013-06-26 LAB — I-STAT CG4 LACTIC ACID, ED: Lactic Acid, Venous: 0.83 mmol/L (ref 0.5–2.2)

## 2013-06-26 LAB — LIPASE, BLOOD: Lipase: 46 U/L (ref 11–59)

## 2013-06-26 MED ORDER — HYDROMORPHONE HCL PF 1 MG/ML IJ SOLN
1.0000 mg | Freq: Once | INTRAMUSCULAR | Status: AC
  Administered 2013-06-26: 1 mg via INTRAVENOUS
  Filled 2013-06-26: qty 1

## 2013-06-26 MED ORDER — SODIUM CHLORIDE 0.9 % IV BOLUS (SEPSIS): 1000.0000 mL | Freq: Once | INTRAVENOUS | Status: DC

## 2013-06-26 MED ORDER — IOHEXOL 300 MG/ML  SOLN
25.0000 mL | Freq: Once | INTRAMUSCULAR | Status: AC | PRN
  Administered 2013-06-26: 25 mL via ORAL

## 2013-06-26 MED ORDER — ONDANSETRON 4 MG PO TBDP: ORAL_TABLET | ORAL | Status: DC

## 2013-06-26 MED ORDER — SODIUM CHLORIDE 0.9 % IV BOLUS (SEPSIS)
1000.0000 mL | Freq: Once | INTRAVENOUS | Status: AC
  Administered 2013-06-26: 1000 mL via INTRAVENOUS

## 2013-06-26 MED ORDER — HYDROCODONE-ACETAMINOPHEN 5-325 MG PO TABS: 1.0000 | ORAL_TABLET | ORAL | Status: DC | PRN

## 2013-06-26 MED ORDER — IOHEXOL 300 MG/ML  SOLN
80.0000 mL | Freq: Once | INTRAMUSCULAR | Status: AC | PRN
  Administered 2013-06-26: 80 mL via INTRAVENOUS

## 2013-06-26 NOTE — ED Notes (Signed)
Patient transported to CT 

## 2013-06-26 NOTE — ED Provider Notes (Signed)
CSN: [id number]     Arrival date & time 06/25/13  2236 History   First MD Initiated Contact with Patient 06/25/13 2339     Chief Complaint  Patient presents with  . Hernia     (Consider location/radiation/quality/duration/timing/severity/associated sxs/prior Treatment) HPI Comments: 53 year old female with history of polycystic kidney disease, high blood pressure, hysterectomy presents with gradually worsening central upper abdominal discomfort the past 4 days. Initially intermittent now constant worse with palpation and lifting. No injuries. No fevers chills or vomiting. Patient passing gas as usual. No known hernia or bowel structure history. Improves with lying flat. Nonradiating  The history is provided by the patient.    Past Medical History  Diagnosis Date  . Hypertension   . Ovarian cyst   . Polycystic disease, ovaries   . Depression     no meds  . GERD (gastroesophageal reflux disease)   . Polycystic kidney disease     1998   Past Surgical History  Procedure Laterality Date  . No past surgeries    . Bladder suspension  08/11/2011    Procedure: TRANSVAGINAL TAPE (TVT) PROCEDURE;  Surgeon: Emily Filbert, MD;  Location: [facility];  Service: Gynecology;  Laterality: N/A;  Add:  Cystoscopy  . Abdominal hysterectomy  2013   Family History  Problem Relation Age of Onset  . Asthma Mother   . Hypertension Mother   . Asthma Father   . Hypertension Father    History  Substance Use Topics  . Smoking status: Current Every Day Smoker -- 0.50 packs/day for .5 years    Types: Cigarettes  . Smokeless tobacco: Former Systems developer    Quit date: 07/31/2011  . Alcohol Use: No   OB History   Grav Para Term Preterm Abortions TAB SAB Ect Mult Living   5 2 2  3 2 1   2      Review of Systems  Constitutional: Negative for fever and chills.  HENT: Negative for congestion.   Eyes: Negative for visual disturbance.  Respiratory: Negative for shortness of breath.   Cardiovascular: Negative for  chest pain.  Gastrointestinal: Positive for nausea and abdominal pain. Negative for vomiting.  Genitourinary: Negative for dysuria and flank pain.  Musculoskeletal: Negative for back pain, neck pain and neck stiffness.  Skin: Negative for rash.  Neurological: Negative for light-headedness and headaches.      Allergies  Ace inhibitors  Home Medications   Prior to Admission medications   Medication Sig Start Date End Date Taking? Authorizing Provider  amLODipine (NORVASC) 10 MG tablet Take 1 tablet (10 mg total) by mouth daily. 04/14/13  Yes Barton Fanny, MD  traMADol (ULTRAM) 50 MG tablet Take 1 tablet (50 mg total) by mouth 2 (two) times daily. 04/14/13  Yes Barton Fanny, MD  HYDROcodone-acetaminophen (NORCO) 5-325 MG per tablet Take 1-2 tablets by mouth every 4 (four) hours as needed. 06/26/13   Mariea Clonts, MD  ondansetron (ZOFRAN ODT) 4 MG disintegrating tablet 4mg  ODT q4 hours prn nausea/vomit 06/26/13   Mariea Clonts, MD   BP 164/83  Pulse 70  Temp(Src) 98 F (36.7 C) (Oral)  Resp 16  Wt 169 lb 9 oz (76.913 kg)  SpO2 98% Physical Exam  Nursing note and vitals reviewed. Constitutional: She is oriented to person, place, and time. She appears well-developed and well-nourished.  HENT:  Head: Normocephalic and atraumatic.  Eyes: Conjunctivae are normal. Right eye exhibits no discharge. Left eye exhibits no discharge.  Neck: Normal range  of motion. Neck supple. No tracheal deviation present.  Cardiovascular: Normal rate and regular rhythm.   Pulmonary/Chest: Effort normal and breath sounds normal.  Abdominal: Soft. She exhibits no distension. There is tenderness (Central with mild fullness.). There is no guarding.  Musculoskeletal: She exhibits no edema.  Neurological: She is alert and oriented to person, place, and time.  Skin: Skin is warm. No rash noted.  Psychiatric: She has a normal mood and affect.    ED Course  Procedures (including critical care  time) Labs Review Labs Reviewed  COMPREHENSIVE METABOLIC PANEL - Abnormal; Notable for the following:    Glucose, Bld 120 (*)    BUN 30 (*)    Creatinine, Ser 1.78 (*)    Albumin 3.2 (*)    Total Bilirubin <0.2 (*)    GFR calc non Af Amer 32 (*)    GFR calc Af Amer 37 (*)    All other components within normal limits  URINALYSIS, ROUTINE W REFLEX MICROSCOPIC - Abnormal; Notable for the following:    Protein, ur 30 (*)    All other components within normal limits  CBC WITH DIFFERENTIAL  LIPASE, BLOOD  URINE MICROSCOPIC-ADD ON  I-STAT CG4 LACTIC ACID, ED    Imaging Review Ct Abdomen Pelvis W Contrast  06/26/2013   CLINICAL DATA:  Pain and swelling. Painful not at the lumbar like is.  EXAM: CT ABDOMEN AND PELVIS WITH CONTRAST  TECHNIQUE: Multidetector CT imaging of the abdomen and pelvis was performed using the standard protocol following bolus administration of intravenous contrast.  CONTRAST:  57mL OMNIPAQUE IOHEXOL 300 MG/ML  SOLN  COMPARISON:  11/13/2006.  FINDINGS: The lung bases demonstrate moderate breathing motion artifact. There is dependent atelectasis. The heart is normal in size.  The liver is unremarkable. No focal lesions or biliary dilatation. The gallbladder is surgically absent. No common bile duct dilatation. The pancreas is unremarkable. The spleen is normal in size. No focal lesions. The adrenal glands are unremarkable. The kidneys are markedly enlarged and demonstrate polycystic disease. No worrisome renal masses, renal calculi or hydronephrosis. Parenchymal calcifications are noted.  The stomach, duodenum, small bowel and colon are unremarkable. No inflammatory changes, mass lesions or obstructive findings. No mesenteric or retroperitoneal mass or adenopathy. The appendix is normal. Moderate atherosclerotic calcifications involving the aorta. The major branch vessels are patent.  The bladder is unremarkable. The uterus is surgically absent. No pelvic mass or adenopathy. No  inguinal mass or adenopathy. There is a small right inguinal hernia containing a small amount of fluid. There is a periumbilical abdominal wall hernia which is stable. However, there is inflammation, fluid and edema suggesting incarcerated/the infarcted omental fat. This is likely responsible for the patient's pain.  The bony structures are unremarkable.  IMPRESSION: 1. Incarcerated periumbilical abdominal wall hernia containing omentum. No bowel involvement. 2. Markedly enlarged polycystic kidneys. 3. Small right inguinal hernia containing a small amount of fluid.   Electronically Signed   By: Kalman Jewels M.D.   On: 06/26/2013 02:32     EKG Interpretation None      MDM   Final diagnoses:  CRF (chronic renal failure)  Incarcerated hernia  Abdominal pain   Patient with central abdominal pain clinically concern for hernia. CT abdomen performed which found incarcerated hernia with omentum. Pain medicines and IV fluids given the patient. Discussed this case with Dr. Ninfa Linden who recommended close outpatient followup if pain control. Recheck pain is controlled and patient will followup. I discussed trying to reduce hernia  patient does not want that because it hurts to palpation. Without palpation pain is controlled. Medications  sodium chloride 0.9 % bolus 1,000 mL (not administered)  HYDROmorphone (DILAUDID) injection 1 mg (1 mg Intravenous Given 06/26/13 0306)  sodium chloride 0.9 % bolus 1,000 mL (0 mLs Intravenous Stopped 06/26/13 0546)  iohexol (OMNIPAQUE) 300 MG/ML solution 25 mL (25 mLs Oral Contrast Given 06/26/13 0053)  iohexol (OMNIPAQUE) 300 MG/ML solution 80 mL (80 mLs Intravenous Contrast Given 06/26/13 0207)    Results and differential diagnosis were discussed with the patient/parent/guardian. Close follow up outpatient was discussed, comfortable with the plan.   Filed Vitals:   06/25/13 2249 06/26/13 0100 06/26/13 0300 06/26/13 0402  BP: 145/82 137/90 156/95 164/83  Pulse: 94  81 86 70  Temp: 98 F (36.7 C)     TempSrc: Oral     Resp: 18  18 16   Weight: 169 lb 9 oz (76.913 kg)     SpO2: 99% 97% 97% 98%        Mariea Clonts, MD 06/26/13 [phone]

## 2013-06-26 NOTE — ED Notes (Signed)
Pt ambulatory at discharge,teaching done

## 2013-06-26 NOTE — Discharge Instructions (Signed)
Follow up with surgery as discussed.  If you were given medicines take as directed.  If you are on coumadin or contraceptives realize their levels and effectiveness is altered by many different medicines.  If you have any reaction (rash, tongues swelling, other) to the medicines stop taking and see a physician.   Please follow up as directed and return to the ER or see a physician for new or worsening symptoms.  Thank you. Filed Vitals:   06/25/13 2249 06/26/13 0100 06/26/13 0300 06/26/13 0402  BP: 145/82 137/90 156/95 164/83  Pulse: 94 81 86 70  Temp: 98 F (36.7 C)     TempSrc: Oral     Resp: 18  18 16   Weight: 169 lb 9 oz (76.913 kg)     SpO2: 99% 97% 97% 98%

## 2013-07-01 ENCOUNTER — Encounter (INDEPENDENT_AMBULATORY_CARE_PROVIDER_SITE_OTHER): Payer: Self-pay | Admitting: Surgery

## 2013-07-01 ENCOUNTER — Encounter (HOSPITAL_COMMUNITY): Payer: Self-pay | Admitting: *Deleted

## 2013-07-01 ENCOUNTER — Ambulatory Visit (INDEPENDENT_AMBULATORY_CARE_PROVIDER_SITE_OTHER): Payer: BC Managed Care – PPO | Admitting: Surgery

## 2013-07-01 VITALS — BP 124/70 | HR 81 | Temp 98.0°F | Resp 18 | Ht 66.0 in | Wt 168.0 lb

## 2013-07-01 DIAGNOSIS — K420 Umbilical hernia with obstruction, without gangrene: Secondary | ICD-10-CM | POA: Insufficient documentation

## 2013-07-01 HISTORY — DX: Umbilical hernia with obstruction, without gangrene: K42.0

## 2013-07-01 NOTE — Progress Notes (Signed)
Patient ID: Kristen Moreno, female   DOB: Apr 20, 1960, 53 y.o.   MRN: [id number]  Chief Complaint  Patient presents with  . New Evaluation    umb.hernia    HPI Kristen Moreno is a 53 y.o. female.   HPI She is referred by the emergency department. She noticed a painful swelling at her umbilicus approximately 2 weeks ago. She has significant focal pain which is now improved. She described the pain as sharp. She denied nausea or vomiting. Bowel movements are normal.  She does do heavy lifting at work. Past Medical History  Diagnosis Date  . Hypertension   . Ovarian cyst   . Polycystic disease, ovaries   . Depression     no meds  . GERD (gastroesophageal reflux disease)   . Polycystic kidney disease     1998    Past Surgical History  Procedure Laterality Date  . No past surgeries    . Bladder suspension  08/11/2011    Procedure: TRANSVAGINAL TAPE (TVT) PROCEDURE;  Surgeon: Emily Filbert, MD;  Location: [facility];  Service: Gynecology;  Laterality: N/A;  Add:  Cystoscopy  . Abdominal hysterectomy  2013    Family History  Problem Relation Age of Onset  . Asthma Mother   . Hypertension Mother   . Asthma Father   . Hypertension Father     Social History History  Substance Use Topics  . Smoking status: Current Every Day Smoker -- 0.50 packs/day for .5 years    Types: Cigarettes  . Smokeless tobacco: Former Systems developer    Quit date: 07/31/2011  . Alcohol Use: No    Allergies  Allergen Reactions  . Ace Inhibitors Cough    Current Outpatient Prescriptions  Medication Sig Dispense Refill  . amLODipine (NORVASC) 10 MG tablet Take 1 tablet (10 mg total) by mouth daily.  30 tablet  5  . HYDROcodone-acetaminophen (NORCO) 5-325 MG per tablet Take 1-2 tablets by mouth every 4 (four) hours as needed.  15 tablet  0  . ondansetron (ZOFRAN ODT) 4 MG disintegrating tablet 4mg  ODT q4 hours prn nausea/vomit  4 tablet  0  . traMADol (ULTRAM) 50 MG tablet Take 1 tablet (50 mg total) by mouth 2  (two) times daily.  40 tablet  1   No current facility-administered medications for this visit.    Review of Systems Review of Systems  Constitutional: Negative for fever, chills and unexpected weight change.  HENT: Negative for congestion, hearing loss, sore throat, trouble swallowing and voice change.   Eyes: Negative for visual disturbance.  Respiratory: Negative for cough and wheezing.   Cardiovascular: Negative for chest pain, palpitations and leg swelling.  Gastrointestinal: Positive for abdominal pain. Negative for nausea, vomiting, diarrhea, constipation, blood in stool, abdominal distention and anal bleeding.  Genitourinary: Negative for hematuria, vaginal bleeding and difficulty urinating.  Musculoskeletal: Negative for arthralgias.  Skin: Negative for rash and wound.  Neurological: Negative for seizures, syncope and headaches.  Hematological: Negative for adenopathy. Does not bruise/bleed easily.  Psychiatric/Behavioral: Negative for confusion.    Blood pressure 124/70, pulse 81, temperature 98 F (36.7 C), resp. rate 18, height 5\' 6"  (1.676 m), weight 168 lb (76.204 kg).  Physical Exam Physical Exam  Constitutional: She is oriented to person, place, and time. She appears well-developed and well-nourished. No distress.  HENT:  Head: Normocephalic and atraumatic.  Right Ear: External ear normal.  Left Ear: External ear normal.  Nose: Nose normal.  Mouth/Throat: Oropharynx is clear and moist.  Eyes:  Conjunctivae are normal. Pupils are equal, round, and reactive to light. Right eye exhibits no discharge. Left eye exhibits no discharge. No scleral icterus.  Neck: Normal range of motion. Neck supple. No tracheal deviation present.  Cardiovascular: Normal rate, regular rhythm, normal heart sounds and intact distal pulses.   No murmur heard. Pulmonary/Chest: Effort normal and breath sounds normal. No respiratory distress. She has no wheezes. She has no rales.  Abdominal:  Soft. There is tenderness. There is guarding.  There is a moderate-sized, chronically incarcerated, tender umbilical hernia. The rest of the abdomen is soft and nontender. I cannot feel a right inguinal hernia as shown on CAT scan  Musculoskeletal: Normal range of motion. She exhibits no edema and no tenderness.  Lymphadenopathy:    She has no cervical adenopathy.  Neurological: She is alert and oriented to person, place, and time.  Skin: Skin is warm and dry. No rash noted. She is not diaphoretic. No erythema.  Psychiatric: Her behavior is normal. Judgment normal.    Data Reviewed I have reviewed her CAT scan. She has actually had an umbilical hernia since [id number]. The most recent scan showed more inflammation with chronically incarcerated fat. No bowel was involved. There was an incidental right inguinal hernia containing only fluid  Assessment    Incarcerated umbilical hernia     Plan    Urgent repair of the hernia with mesh was recommended. I explained the diagnosis to the patient in detail. I discussed the risks of surgery which includes but is not limited to bleeding, infection, recurrence, injury to surrounding structures, etc. As she is asymptomatic from the inguinal hernia and I cannot palpate a defect, we will hold on repair of the inguinal hernia. Surgery for the umbilical hernia repair with mesh will be scheduled        Nike Southers A 07/01/2013, 11:21 AM

## 2013-07-04 ENCOUNTER — Ambulatory Visit (HOSPITAL_COMMUNITY): Payer: BC Managed Care – PPO | Admitting: Anesthesiology

## 2013-07-04 ENCOUNTER — Other Ambulatory Visit: Payer: Self-pay

## 2013-07-04 ENCOUNTER — Ambulatory Visit (HOSPITAL_COMMUNITY): Payer: BC Managed Care – PPO

## 2013-07-04 ENCOUNTER — Encounter (HOSPITAL_COMMUNITY): Payer: BC Managed Care – PPO | Admitting: Anesthesiology

## 2013-07-04 ENCOUNTER — Ambulatory Visit (HOSPITAL_COMMUNITY)
Admission: RE | Admit: 2013-07-04 | Discharge: 2013-07-04 | Disposition: A | Discharge status: Home / Self Care | Payer: BC Managed Care – PPO | Source: Ambulatory Visit | Attending: Surgery | Admitting: Surgery

## 2013-07-04 ENCOUNTER — Encounter (HOSPITAL_COMMUNITY): Payer: Self-pay | Admitting: *Deleted

## 2013-07-04 ENCOUNTER — Encounter (HOSPITAL_COMMUNITY)
Admission: RE | Disposition: A | Discharge status: Home / Self Care | Payer: Self-pay | Source: Ambulatory Visit | Attending: Surgery

## 2013-07-04 DIAGNOSIS — Z79899 Other long term (current) drug therapy: Secondary | ICD-10-CM | POA: Insufficient documentation

## 2013-07-04 DIAGNOSIS — F329 Major depressive disorder, single episode, unspecified: Secondary | ICD-10-CM | POA: Insufficient documentation

## 2013-07-04 DIAGNOSIS — I10 Essential (primary) hypertension: Secondary | ICD-10-CM | POA: Insufficient documentation

## 2013-07-04 DIAGNOSIS — K219 Gastro-esophageal reflux disease without esophagitis: Secondary | ICD-10-CM | POA: Insufficient documentation

## 2013-07-04 DIAGNOSIS — K429 Umbilical hernia without obstruction or gangrene: Secondary | ICD-10-CM | POA: Insufficient documentation

## 2013-07-04 DIAGNOSIS — F3289 Other specified depressive episodes: Secondary | ICD-10-CM | POA: Insufficient documentation

## 2013-07-04 DIAGNOSIS — N289 Disorder of kidney and ureter, unspecified: Secondary | ICD-10-CM | POA: Insufficient documentation

## 2013-07-04 DIAGNOSIS — K420 Umbilical hernia with obstruction, without gangrene: Secondary | ICD-10-CM

## 2013-07-04 DIAGNOSIS — F17200 Nicotine dependence, unspecified, uncomplicated: Secondary | ICD-10-CM | POA: Insufficient documentation

## 2013-07-04 HISTORY — PX: INSERTION OF MESH: SHX5868

## 2013-07-04 HISTORY — DX: Unspecified osteoarthritis, unspecified site: M19.90

## 2013-07-04 HISTORY — PX: UMBILICAL HERNIA REPAIR: SHX196

## 2013-07-04 LAB — BASIC METABOLIC PANEL
BUN: 27 mg/dL — ABNORMAL HIGH (ref 6–23)
CO2: 25 mEq/L (ref 19–32)
Calcium: 9.4 mg/dL (ref 8.4–10.5)
Chloride: 105 mEq/L (ref 96–112)
Creatinine, Ser: 1.69 mg/dL — ABNORMAL HIGH (ref 0.50–1.10)
GFR calc Af Amer: 39 mL/min — ABNORMAL LOW (ref >90)
GFR calc non Af Amer: 34 mL/min — ABNORMAL LOW (ref >90)
Glucose, Bld: 95 mg/dL (ref 70–99)
Potassium: 3.9 mEq/L (ref 3.7–5.3)
Sodium: 144 mEq/L (ref 137–147)

## 2013-07-04 SURGERY — REPAIR, HERNIA, UMBILICAL, ADULT
Anesthesia: General | Site: Abdomen

## 2013-07-04 MED ORDER — CEFAZOLIN SODIUM-DEXTROSE 2-3 GM-% IV SOLR
2.0000 g | INTRAVENOUS | Status: AC
  Administered 2013-07-04: 2 g via INTRAVENOUS

## 2013-07-04 MED ORDER — FENTANYL CITRATE 0.05 MG/ML IJ SOLN
INTRAMUSCULAR | Status: DC | PRN
  Administered 2013-07-04 (×6): 25 ug via INTRAVENOUS
  Administered 2013-07-04: 50 ug via INTRAVENOUS
  Administered 2013-07-04 (×2): 25 ug via INTRAVENOUS

## 2013-07-04 MED ORDER — MIDAZOLAM HCL 2 MG/2ML IJ SOLN
INTRAMUSCULAR | Status: AC
Start: 2013-07-04 — End: 2013-07-04
  Filled 2013-07-04: qty 2

## 2013-07-04 MED ORDER — ACETAMINOPHEN 650 MG RE SUPP
650.0000 mg | RECTAL | Status: DC | PRN
  Filled 2013-07-04: qty 1

## 2013-07-04 MED ORDER — DEXAMETHASONE SODIUM PHOSPHATE 10 MG/ML IJ SOLN
INTRAMUSCULAR | Status: DC | PRN
  Administered 2013-07-04: 10 mg via INTRAVENOUS

## 2013-07-04 MED ORDER — BUPIVACAINE HCL (PF) 0.5 % IJ SOLN
INTRAMUSCULAR | Status: DC | PRN
  Administered 2013-07-04: 20 mL

## 2013-07-04 MED ORDER — LACTATED RINGERS IV SOLN: INTRAVENOUS | Status: DC

## 2013-07-04 MED ORDER — MORPHINE SULFATE 10 MG/ML IJ SOLN: 1.0000 mg | INTRAMUSCULAR | Status: DC | PRN

## 2013-07-04 MED ORDER — ONDANSETRON HCL 4 MG/2ML IJ SOLN
INTRAMUSCULAR | Status: DC | PRN
  Administered 2013-07-04: 4 mg via INTRAVENOUS

## 2013-07-04 MED ORDER — ONDANSETRON HCL 4 MG/2ML IJ SOLN
INTRAMUSCULAR | Status: AC
  Filled 2013-07-04: qty 2

## 2013-07-04 MED ORDER — SODIUM CHLORIDE 0.9 % IJ SOLN: 3.0000 mL | INTRAMUSCULAR | Status: DC | PRN

## 2013-07-04 MED ORDER — MIDAZOLAM HCL 5 MG/5ML IJ SOLN
INTRAMUSCULAR | Status: DC | PRN
  Administered 2013-07-04 (×2): 1 mg via INTRAVENOUS

## 2013-07-04 MED ORDER — SODIUM CHLORIDE 0.9 % IJ SOLN: 3.0000 mL | Freq: Two times a day (BID) | INTRAMUSCULAR | Status: DC

## 2013-07-04 MED ORDER — DEXAMETHASONE SODIUM PHOSPHATE 10 MG/ML IJ SOLN
INTRAMUSCULAR | Status: AC
  Filled 2013-07-04: qty 1

## 2013-07-04 MED ORDER — PROPOFOL 10 MG/ML IV BOLUS
INTRAVENOUS | Status: DC | PRN
  Administered 2013-07-04: 150 mg via INTRAVENOUS

## 2013-07-04 MED ORDER — SODIUM CHLORIDE 0.9 % IV SOLN: 250.0000 mL | INTRAVENOUS | Status: DC | PRN

## 2013-07-04 MED ORDER — BUPIVACAINE HCL (PF) 0.5 % IJ SOLN
INTRAMUSCULAR | Status: AC
  Filled 2013-07-04: qty 30

## 2013-07-04 MED ORDER — LACTATED RINGERS IV SOLN
INTRAVENOUS | Status: DC | PRN
  Administered 2013-07-04: 07:00:00 via INTRAVENOUS

## 2013-07-04 MED ORDER — OXYCODONE HCL 5 MG PO TABS
ORAL_TABLET | ORAL | Status: AC
  Filled 2013-07-04: qty 2

## 2013-07-04 MED ORDER — CEFAZOLIN SODIUM-DEXTROSE 2-3 GM-% IV SOLR
INTRAVENOUS | Status: AC
  Filled 2013-07-04: qty 50

## 2013-07-04 MED ORDER — PROMETHAZINE HCL 25 MG/ML IJ SOLN: 6.2500 mg | INTRAMUSCULAR | Status: DC | PRN

## 2013-07-04 MED ORDER — OXYCODONE HCL 5 MG PO TABS
5.0000 mg | ORAL_TABLET | ORAL | Status: DC | PRN
  Administered 2013-07-04: 10 mg via ORAL

## 2013-07-04 MED ORDER — HYDROMORPHONE HCL PF 1 MG/ML IJ SOLN
0.2500 mg | INTRAMUSCULAR | Status: DC | PRN
  Administered 2013-07-04 (×2): 0.5 mg via INTRAVENOUS

## 2013-07-04 MED ORDER — FENTANYL CITRATE 0.05 MG/ML IJ SOLN
INTRAMUSCULAR | Status: AC
  Filled 2013-07-04: qty 5

## 2013-07-04 MED ORDER — HYDROCODONE-ACETAMINOPHEN 5-325 MG PO TABS: 1.0000 | ORAL_TABLET | ORAL | Status: DC | PRN

## 2013-07-04 MED ORDER — PROPOFOL 10 MG/ML IV BOLUS
INTRAVENOUS | Status: AC
  Filled 2013-07-04: qty 20

## 2013-07-04 MED ORDER — ACETAMINOPHEN 325 MG PO TABS: 650.0000 mg | ORAL_TABLET | ORAL | Status: DC | PRN

## 2013-07-04 MED ORDER — HYDROMORPHONE HCL PF 1 MG/ML IJ SOLN
INTRAMUSCULAR | Status: AC
  Filled 2013-07-04: qty 1

## 2013-07-04 SURGICAL SUPPLY — 31 items
BINDER ABDOMINAL 12 ML 46-62 (SOFTGOODS) IMPLANT
BLADE HEX COATED 2.75 (ELECTRODE) ×2 IMPLANT
CANISTER SUCTION 2500CC (MISCELLANEOUS) ×1 IMPLANT
DRAPE LAPAROSCOPIC ABDOMINAL (DRAPES) ×2 IMPLANT
DRSG TEGADERM 2-3/8X2-3/4 SM (GAUZE/BANDAGES/DRESSINGS) ×1 IMPLANT
ELECT REM PT RETURN 9FT ADLT (ELECTROSURGICAL) ×2
ELECTRODE REM PT RTRN 9FT ADLT (ELECTROSURGICAL) ×1 IMPLANT
GAUZE SPONGE 2X2 8PLY STRL LF (GAUZE/BANDAGES/DRESSINGS) IMPLANT
GAUZE SPONGE 4X4 12PLY STRL (GAUZE/BANDAGES/DRESSINGS) ×2 IMPLANT
GLOVE BIOGEL PI IND STRL 7.0 (GLOVE) ×1 IMPLANT
GLOVE BIOGEL PI INDICATOR 7.0 (GLOVE) ×1
GLOVE SURG SIGNA 7.5 PF LTX (GLOVE) ×4 IMPLANT
GLOVE SURG SS PI 8.5 STRL IVOR (GLOVE) ×1
GLOVE SURG SS PI 8.5 STRL STRW (GLOVE) IMPLANT
GOWN STRL REUS W/TWL LRG LVL3 (GOWN DISPOSABLE) ×2 IMPLANT
GOWN STRL REUS W/TWL XL LVL3 (GOWN DISPOSABLE) ×4 IMPLANT
KIT BASIN OR (CUSTOM PROCEDURE TRAY) ×2 IMPLANT
MESH VENTRALEX ST 1-7/10 CRC S (Mesh General) ×1 IMPLANT
NEEDLE HYPO 22GX1.5 SAFETY (NEEDLE) ×1 IMPLANT
NS IRRIG 1000ML POUR BTL (IV SOLUTION) ×2 IMPLANT
PACK GENERAL/GYN (CUSTOM PROCEDURE TRAY) ×2 IMPLANT
SPONGE GAUZE 2X2 STER 10/PKG (GAUZE/BANDAGES/DRESSINGS) ×1
STRIP CLOSURE SKIN 1/2X4 (GAUZE/BANDAGES/DRESSINGS) ×1 IMPLANT
SUT MNCRL AB 4-0 PS2 18 (SUTURE) ×2 IMPLANT
SUT NOVA NAB DX-16 0-1 5-0 T12 (SUTURE) ×3 IMPLANT
SUT VIC AB 3-0 SH 27 (SUTURE) ×2
SUT VIC AB 3-0 SH 27X BRD (SUTURE) ×1 IMPLANT
SUT VICRYL 2 0 18  UND BR (SUTURE)
SUT VICRYL 2 0 18 UND BR (SUTURE) IMPLANT
SYR CONTROL 10ML LL (SYRINGE) ×1 IMPLANT
TOWEL OR 17X26 10 PK STRL BLUE (TOWEL DISPOSABLE) ×2 IMPLANT

## 2013-07-04 NOTE — Op Note (Signed)
[name], Kristen Moreno            ACCOUNT NO.:  [id number]  MEDICAL RECORD NO.:  [id number]  LOCATION:  [facility]                         FACILITY:  [facility]  PHYSICIAN:  Coralie Keens, M.D. DATE OF BIRTH:  Feb 01, 1960  DATE OF PROCEDURE:  07/04/2013 DATE OF DISCHARGE:                              OPERATIVE REPORT   PREOPERATIVE DIAGNOSIS:  Chronically incarcerated umbilical hernia.  POSTOPERATIVE DIAGNOSIS:  Chronically incarcerated umbilical hernia.  PROCEDURE:  Umbilical hernia repair with mesh.  SURGEON:  Coralie Keens, M.D.  ANESTHESIA:  General with 0.5% Marcaine.  ESTIMATED BLOOD LOSS:  Minimal.  INDICATIONS:  This is a 53 year old female who presents with an incarcerated umbilical hernia containing omentum.  Her most recent CAT scan showed a moderate amount of inflammation, and secondary to this and her tenderness, urgent surgery was recommended.  PROCEDURE IN DETAIL:  The patient was brought to the operating room, identified as [name].  She was placed supine on the operating room table and general anesthesia was induced.  Her abdomen was then prepped and draped in usual sterile fashion.  I made a small transverse incision just below the lower edge of the umbilicus.  This was taken down to the hernia sac which was separated from the overlying umbilical skin.  The sac was then completely removed, and the omentum that was incarcerated in the hernia sac was excised.  The actual fascial defect was approximately 1 cm in size.  A 4.3 cm round Bard umbilical patch was then brought on to the field.  I placed it through the fascial opening and then pulled it up against the peritoneum with stay ties.  I then sewed the mesh in circumferentially with interrupted #1 Novafil sutures. I then cut the stay ties and close the fascia over top of the mesh with a figure-of-eight #1 Novafil suture as well.  Good coverage of the fascia widely appeared to be achieved.  At this  point, the fascia and skin were anesthetized further with Marcaine.  I placed the umbilicus back in its natural position with 3-0 Vicryl suture.  I then closed subcutaneous tissue with interrupted 3-0 Vicryl sutures and closed the skin with running 4-0 Monocryl.  Steri-Strips, gauze, and Tegaderm were then applied.  The patient tolerated the procedure well.  All the counts were correct at the end of the procedure.  The patient was then extubated in the operating room and taken in stable condition to recovery room.     Coralie Keens, M.D.     DB/MEDQ  D:  07/04/2013  T:  07/04/2013  Job:  [id number]

## 2013-07-04 NOTE — Transfer of Care (Signed)
Immediate Anesthesia Transfer of Care Note  Patient: Kristen Moreno  Procedure(s) Performed: Procedure(s): HERNIA REPAIR UMBILICAL  (N/A) INSERTION OF MESH (N/A)  Patient Location: PACU  Anesthesia Type:General  Level of Consciousness: awake, alert  and patient cooperative  Airway & Oxygen Therapy: Patient Spontanous Breathing and Patient connected to face mask oxygen  Post-op Assessment: Report given to PACU RN and Post -op Vital signs reviewed and stable  Post vital signs: Reviewed and stable  Complications: No apparent anesthesia complications

## 2013-07-04 NOTE — Anesthesia Preprocedure Evaluation (Signed)
Anesthesia Evaluation  Patient identified by MRN, date of birth, ID band Patient awake    Reviewed: Allergy & Precautions, H&P , NPO status , Patient's Chart, lab work & pertinent test results  Airway Mallampati: II TM Distance: >3 FB Neck ROM: Full    Dental no notable dental hx. (+) Teeth Intact, Dental Advisory Given   Pulmonary neg pulmonary ROS, pneumonia -, resolved, Recent URI , Resolved, Current Smoker,  Was diagnosed with Lower Respiratory infection last Tue. Cough has pretty much resolved. Took Azithromycin for 5 days last dose 7/19. Resolved. breath sounds clear to auscultation  Pulmonary exam normal       Cardiovascular hypertension, Pt. on medications Rhythm:Regular Rate:Normal     Neuro/Psych  Headaches, Depression    GI/Hepatic Neg liver ROS, GERD-  Medicated,  Endo/Other  negative endocrine ROS  Renal/GU Renal InsufficiencyRenal diseasePolycystic renal disease. Bladder dysfunction  SUI    Musculoskeletal negative musculoskeletal ROS (+)   Abdominal   Peds  Hematology negative hematology ROS (+)   Anesthesia Other Findings   Reproductive/Obstetrics Uterine Fibroids                           Anesthesia Physical Anesthesia Plan  ASA: III  Anesthesia Plan: General   Post-op Pain Management:    Induction: Intravenous  Airway Management Planned: LMA and Oral ETT  Additional Equipment:   Intra-op Plan:   Post-operative Plan: Extubation in OR  Informed Consent: I have reviewed the patients History and Physical, chart, labs and discussed the procedure including the risks, benefits and alternatives for the proposed anesthesia with the patient or authorized representative who has indicated his/her understanding and acceptance.   Dental advisory given  Plan Discussed with: CRNA  Anesthesia Plan Comments: (Discuss with surgeon oral tube vs LMA.)        Anesthesia  Quick Evaluation

## 2013-07-04 NOTE — Anesthesia Postprocedure Evaluation (Signed)
Anesthesia Post Note  Patient: Kristen Moreno  Procedure(s) Performed: Procedure(s) (LRB): HERNIA REPAIR UMBILICAL  (N/A) INSERTION OF MESH (N/A)  Anesthesia type: General  Patient location: PACU  Post pain: Pain level controlled  Post assessment: Post-op Vital signs reviewed  Last Vitals:  Filed Vitals:   07/04/13 0958  BP: 137/82  Pulse: 82  Temp: 36.4 C  Resp: 18    Post vital signs: Reviewed  Level of consciousness: sedated  Complications: No apparent anesthesia complications

## 2013-07-04 NOTE — Op Note (Signed)
HERNIA REPAIR UMBILICAL , INSERTION OF MESH  Procedure Note  Saman Nigh 07/04/2013   Pre-op Diagnosis: umbilical hernia     Post-op Diagnosis: same  Procedure(s): HERNIA REPAIR UMBILICAL  INSERTION OF MESH  Surgeon(s): Harl Bowie, MD  Anesthesia: General  Staff:  Circulator: Francena Hanly, RN Relief Scrub: Nickolas Madrid, RN Scrub Person: Mammie Lorenzo  Estimated Blood Loss: Minimal                         Jadea Shiffer A   Date: 07/04/2013  Time: 8:01 AM

## 2013-07-04 NOTE — H&P (Signed)
Patient ID: Kristen Moreno, female DOB: 03-25-1960, 53 y.o. MRN: [id number]  Chief Complaint   Patient presents with   .  New Evaluation     umb.hernia   HPI  Kristen Moreno is a 53 y.o. female.  HPI  She is referred by the emergency department. She noticed a painful swelling at her umbilicus approximately 2 weeks ago. She has significant focal pain which is now improved. She described the pain as sharp. She denied nausea or vomiting. Bowel movements are normal. She does do heavy lifting at work.  Past Medical History   Diagnosis  Date   .  Hypertension    .  Ovarian cyst    .  Polycystic disease, ovaries    .  Depression      no meds   .  GERD (gastroesophageal reflux disease)    .  Polycystic kidney disease      1998    Past Surgical History   Procedure  Laterality  Date   .  No past surgeries     .  Bladder suspension   08/11/2011     Procedure: TRANSVAGINAL TAPE (TVT) PROCEDURE; Surgeon: Emily Filbert, MD; Location: [facility]; Service: Gynecology; Laterality: N/A; Add: Cystoscopy   .  Abdominal hysterectomy   2013    Family History   Problem  Relation  Age of Onset   .  Asthma  Mother    .  Hypertension  Mother    .  Asthma  Father    .  Hypertension  Father    Social History  History   Substance Use Topics   .  Smoking status:  Current Every Day Smoker -- 0.50 packs/day for .5 years     Types:  Cigarettes   .  Smokeless tobacco:  Former Systems developer     Quit date:  07/31/2011   .  Alcohol Use:  No    Allergies   Allergen  Reactions   .  Ace Inhibitors  Cough    Current Outpatient Prescriptions   Medication  Sig  Dispense  Refill   .  amLODipine (NORVASC) 10 MG tablet  Take 1 tablet (10 mg total) by mouth daily.  30 tablet  5   .  HYDROcodone-acetaminophen (NORCO) 5-325 MG per tablet  Take 1-2 tablets by mouth every 4 (four) hours as needed.  15 tablet  0   .  ondansetron (ZOFRAN ODT) 4 MG disintegrating tablet  4mg  ODT q4 hours prn nausea/vomit  4 tablet  0   .  traMADol  (ULTRAM) 50 MG tablet  Take 1 tablet (50 mg total) by mouth 2 (two) times daily.  40 tablet  1    No current facility-administered medications for this visit.   Review of Systems  Review of Systems  Constitutional: Negative for fever, chills and unexpected weight change.  HENT: Negative for congestion, hearing loss, sore throat, trouble swallowing and voice change.  Eyes: Negative for visual disturbance.  Respiratory: Negative for cough and wheezing.  Cardiovascular: Negative for chest pain, palpitations and leg swelling.  Gastrointestinal: Positive for abdominal pain. Negative for nausea, vomiting, diarrhea, constipation, blood in stool, abdominal distention and anal bleeding.  Genitourinary: Negative for hematuria, vaginal bleeding and difficulty urinating.  Musculoskeletal: Negative for arthralgias.  Skin: Negative for rash and wound.  Neurological: Negative for seizures, syncope and headaches.  Hematological: Negative for adenopathy. Does not bruise/bleed easily.  Psychiatric/Behavioral: Negative for confusion.  Blood pressure 124/70, pulse 81, temperature 98 F (  36.7 C), resp. rate 18, height 5\' 6"  (1.676 m), weight 168 lb (76.204 kg).  Physical Exam  Physical Exam  Constitutional: She is oriented to person, place, and time. She appears well-developed and well-nourished. No distress.  HENT:  Head: Normocephalic and atraumatic.  Right Ear: External ear normal.  Left Ear: External ear normal.  Nose: Nose normal.  Mouth/Throat: Oropharynx is clear and moist.  Eyes: Conjunctivae are normal. Pupils are equal, round, and reactive to light. Right eye exhibits no discharge. Left eye exhibits no discharge. No scleral icterus.  Neck: Normal range of motion. Neck supple. No tracheal deviation present.  Cardiovascular: Normal rate, regular rhythm, normal heart sounds and intact distal pulses.  No murmur heard.  Pulmonary/Chest: Effort normal and breath sounds normal. No respiratory distress.  She has no wheezes. She has no rales.  Abdominal: Soft. There is tenderness. There is guarding.  There is a moderate-sized, chronically incarcerated, tender umbilical hernia. The rest of the abdomen is soft and nontender. I cannot feel a right inguinal hernia as shown on CAT scan  Musculoskeletal: Normal range of motion. She exhibits no edema and no tenderness.  Lymphadenopathy:  She has no cervical adenopathy.  Neurological: She is alert and oriented to person, place, and time.  Skin: Skin is warm and dry. No rash noted. She is not diaphoretic. No erythema.  Psychiatric: Her behavior is normal. Judgment normal.   Data Reviewed  I have reviewed her CAT scan. She has actually had an umbilical hernia since [id number]. The most recent scan showed more inflammation with chronically incarcerated fat. No bowel was involved. There was an incidental right inguinal hernia containing only fluid   Assessment  Incarcerated umbilical hernia   Plan  Urgent repair of the hernia with mesh was recommended. I explained the diagnosis to the patient in detail. I discussed the risks of surgery which includes but is not limited to bleeding, infection, recurrence, injury to surrounding structures, etc. As she is asymptomatic from the inguinal hernia and I cannot palpate a defect, we will hold on repair of the inguinal hernia. Surgery for the umbilical hernia repair with mesh will be scheduled

## 2013-07-04 NOTE — Discharge Instructions (Signed)
[facility]__[facility]  UMBILICAL OR INGUINAL HERNIA REPAIR: POST OP INSTRUCTIONS  Always review your discharge instruction sheet given to you by the facility where your surgery was performed. IF YOU HAVE DISABILITY OR FAMILY LEAVE FORMS, YOU MUST BRING THEM TO THE OFFICE FOR PROCESSING.   DO NOT GIVE THEM TO YOUR DOCTOR.  1. A  prescription for pain medication may be given to you upon discharge.  Take your pain medication as prescribed, if needed.  If narcotic pain medicine is not needed, then you may take acetaminophen (Tylenol) or ibuprofen (Advil) as needed. 2. Take your usually prescribed medications unless otherwise directed. 3. If you need a refill on your pain medication, please contact your pharmacy.  They will contact our office to request authorization. Prescriptions will not be filled after 5 pm or on week-ends. 4. You should follow a light diet the first 24 hours after arrival home, such as soup and crackers, etc.  Be sure to include lots of fluids daily.  Resume your normal diet the day after surgery. 5. Most patients will experience some swelling and bruising around the umbilicus or in the groin and scrotum.  Ice packs and reclining will help.  Swelling and bruising can take several days to resolve.  6. It is common to experience some constipation if taking pain medication after surgery.  Increasing fluid intake and taking a stool softener (such as Colace) will usually help or prevent this problem from occurring.  A mild laxative (Milk of Magnesia or Miralax) should be taken according to package directions if there are no bowel movements after 48 hours. 7. Unless discharge instructions indicate otherwise, you may remove your bandages 24-48 hours after surgery, and you may shower at that time.  You may have steri-strips (small skin tapes) in place directly over the incision.  These strips should be left on the skin for 7-10 days.  If your surgeon used skin glue on the  incision, you may shower in 24 hours.  The glue will flake off over the next 2-3 weeks.  Any sutures or staples will be removed at the office during your follow-up visit. 8. ACTIVITIES:  You may resume regular (light) daily activities beginning the next day--such as daily self-care, walking, climbing stairs--gradually increasing activities as tolerated.  You may have sexual intercourse when it is comfortable.  Refrain from any heavy lifting or straining until approved by your doctor. a. You may drive when you are no longer taking prescription pain medication, you can comfortably wear a seatbelt, and you can safely maneuver your car and apply brakes. b. 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 You should see your doctor in the office for a follow-up appointment approximately 2-3 weeks after your surgery.  Make sure that you call for this appointment within a day or two after you arrive home to insure a convenient appointment time. OTHER INSTRUCTIONS:  _______________________NO LIFTING MORE THAN 15 POUNDS FOR 4 WEEKS. ICE PACK ALSO FOR PAIN MIRALAX FOR CONSTIPATION ____________________________________________________________________________________________________________________________________________________________  WHEN TO CALL YOUR DOCTOR: 1. Fever over 101.0 2. Inability to urinate 3. Nausea and/or vomiting 4. Extreme swelling or bruising 5. Continued bleeding from incision. 6. Increased pain, redness, or drainage from the incision  The clinic staff is available to answer your questions during regular business hours.  Please dont hesitate to call and ask to speak to one of the nurses for clinical concerns.  If you have a medical emergency, go to the nearest  emergency room or call 911.  A surgeon from [facility] is always on call at the hospital   [address], [place], [place], [place]  [address]  ?  [address], [place], [place]   [address] [phone] ? [phone] ? FAX [phone] Web site: [email or web address]

## 2013-07-05 ENCOUNTER — Encounter (HOSPITAL_COMMUNITY): Payer: Self-pay | Admitting: Surgery

## 2013-07-07 ENCOUNTER — Encounter (INDEPENDENT_AMBULATORY_CARE_PROVIDER_SITE_OTHER): Payer: Self-pay | Admitting: General Surgery

## 2013-07-12 ENCOUNTER — Encounter (HOSPITAL_COMMUNITY): Payer: Self-pay | Admitting: Emergency Medicine

## 2013-07-12 ENCOUNTER — Emergency Department (HOSPITAL_COMMUNITY): Payer: BC Managed Care – PPO

## 2013-07-12 ENCOUNTER — Emergency Department (HOSPITAL_COMMUNITY)
Admission: EM | Admit: 2013-07-12 | Discharge: 2013-07-13 | Disposition: A | Discharge status: Home / Self Care | Payer: BC Managed Care – PPO | Attending: Emergency Medicine | Admitting: Emergency Medicine

## 2013-07-12 ENCOUNTER — Telehealth (INDEPENDENT_AMBULATORY_CARE_PROVIDER_SITE_OTHER): Payer: Self-pay

## 2013-07-12 DIAGNOSIS — R109 Unspecified abdominal pain: Secondary | ICD-10-CM

## 2013-07-12 DIAGNOSIS — Z8719 Personal history of other diseases of the digestive system: Secondary | ICD-10-CM | POA: Insufficient documentation

## 2013-07-12 DIAGNOSIS — Z79899 Other long term (current) drug therapy: Secondary | ICD-10-CM | POA: Insufficient documentation

## 2013-07-12 DIAGNOSIS — F17200 Nicotine dependence, unspecified, uncomplicated: Secondary | ICD-10-CM | POA: Insufficient documentation

## 2013-07-12 DIAGNOSIS — M1710 Unilateral primary osteoarthritis, unspecified knee: Secondary | ICD-10-CM | POA: Insufficient documentation

## 2013-07-12 DIAGNOSIS — Z862 Personal history of diseases of the blood and blood-forming organs and certain disorders involving the immune mechanism: Secondary | ICD-10-CM | POA: Insufficient documentation

## 2013-07-12 DIAGNOSIS — IMO0002 Reserved for concepts with insufficient information to code with codable children: Secondary | ICD-10-CM

## 2013-07-12 DIAGNOSIS — R1033 Periumbilical pain: Secondary | ICD-10-CM | POA: Insufficient documentation

## 2013-07-12 DIAGNOSIS — Z90710 Acquired absence of both cervix and uterus: Secondary | ICD-10-CM | POA: Insufficient documentation

## 2013-07-12 DIAGNOSIS — Z8639 Personal history of other endocrine, nutritional and metabolic disease: Secondary | ICD-10-CM | POA: Insufficient documentation

## 2013-07-12 DIAGNOSIS — Z9889 Other specified postprocedural states: Secondary | ICD-10-CM | POA: Insufficient documentation

## 2013-07-12 DIAGNOSIS — Q613 Polycystic kidney, unspecified: Secondary | ICD-10-CM | POA: Insufficient documentation

## 2013-07-12 DIAGNOSIS — I10 Essential (primary) hypertension: Secondary | ICD-10-CM | POA: Insufficient documentation

## 2013-07-12 LAB — CBC WITH DIFFERENTIAL/PLATELET
Basophils Absolute: 0 10*3/uL (ref 0.0–0.1)
Basophils Relative: 0 % (ref 0–1)
Eosinophils Absolute: 0.2 10*3/uL (ref 0.0–0.7)
Eosinophils Relative: 4 % (ref 0–5)
HCT: 36.4 % (ref 36.0–46.0)
Hemoglobin: 11.9 g/dL — ABNORMAL LOW (ref 12.0–15.0)
Lymphocytes Relative: 37 % (ref 12–46)
Lymphs Abs: 2.5 10*3/uL (ref 0.7–4.0)
MCH: 30.1 pg (ref 26.0–34.0)
MCHC: 32.7 g/dL (ref 30.0–36.0)
MCV: 92.2 fL (ref 78.0–100.0)
Monocytes Absolute: 0.5 10*3/uL (ref 0.1–1.0)
Monocytes Relative: 8 % (ref 3–12)
Neutro Abs: 3.5 10*3/uL (ref 1.7–7.7)
Neutrophils Relative %: 51 % (ref 43–77)
Platelets: 280 10*3/uL (ref 150–400)
RBC: 3.95 MIL/uL (ref 3.87–5.11)
RDW: 14 % (ref 11.5–15.5)
WBC: 6.8 10*3/uL (ref 4.0–10.5)

## 2013-07-12 LAB — COMPREHENSIVE METABOLIC PANEL
ALT: 14 U/L (ref 0–35)
AST: 14 U/L (ref 0–37)
Albumin: 3.2 g/dL — ABNORMAL LOW (ref 3.5–5.2)
Alkaline Phosphatase: 59 U/L (ref 39–117)
BUN: 26 mg/dL — ABNORMAL HIGH (ref 6–23)
CO2: 21 mEq/L (ref 19–32)
Calcium: 8.9 mg/dL (ref 8.4–10.5)
Chloride: 106 mEq/L (ref 96–112)
Creatinine, Ser: 1.79 mg/dL — ABNORMAL HIGH (ref 0.50–1.10)
GFR calc Af Amer: 37 mL/min — ABNORMAL LOW (ref >90)
GFR calc non Af Amer: 32 mL/min — ABNORMAL LOW (ref >90)
Glucose, Bld: 111 mg/dL — ABNORMAL HIGH (ref 70–99)
Potassium: 4.4 mEq/L (ref 3.7–5.3)
Sodium: 142 mEq/L (ref 137–147)
Total Bilirubin: <0.2 mg/dL — ABNORMAL LOW (ref 0.3–1.2)
Total Protein: 7.1 g/dL (ref 6.0–8.3)

## 2013-07-12 LAB — LIPASE, BLOOD: Lipase: 34 U/L (ref 11–59)

## 2013-07-12 MED ORDER — MORPHINE SULFATE 4 MG/ML IJ SOLN
4.0000 mg | Freq: Once | INTRAMUSCULAR | Status: AC
  Administered 2013-07-12: 4 mg via INTRAVENOUS

## 2013-07-12 MED ORDER — IOHEXOL 300 MG/ML  SOLN
80.0000 mL | Freq: Once | INTRAMUSCULAR | Status: AC | PRN
  Administered 2013-07-12: 80 mL via INTRAVENOUS

## 2013-07-12 MED ORDER — ONDANSETRON HCL 4 MG/2ML IJ SOLN
4.0000 mg | Freq: Once | INTRAMUSCULAR | Status: AC
  Administered 2013-07-12: 4 mg via INTRAVENOUS

## 2013-07-12 MED ORDER — SODIUM CHLORIDE 0.9 % IV SOLN
Freq: Once | INTRAVENOUS | Status: AC
  Administered 2013-07-12: 20:00:00 via INTRAVENOUS

## 2013-07-12 NOTE — Telephone Encounter (Signed)
Pt s/p umblical hernia repair on 07/04/13 by Dr Ninfa Linden. Pt states that she has had an increase in her pain. Pt denies any redness, swelling or drainage from the incision. Pt denies any n/v.  Pt states that she took 2 Norco last night with minimal relief. Pt states that she is unable to take ibuprofen. Advised pt that she can also place a warm compress on the area for pain relief. Pt states that she has gone back to work as well. Advised pt that she may need to decrease her activities and try to increase those back slowly. Informed pt that if pain increases or continues to give [place] a call back. Pt verbalized understanding and agrees with POC.

## 2013-07-12 NOTE — ED Notes (Signed)
Pt returned from bathroom. Pt monitored by 5-lead, pulse ox, and bp cuff. 

## 2013-07-12 NOTE — ED Notes (Signed)
Patient to CT.

## 2013-07-12 NOTE — ED Provider Notes (Signed)
CSN: [id number]     Arrival date & time 07/12/13  1911 History   First MD Initiated Contact with Patient 07/12/13 1935     Chief Complaint  Patient presents with  . Abdominal Pain     (Consider location/radiation/quality/duration/timing/severity/associated sxs/prior Treatment) Patient is a 53 y.o. female presenting with abdominal pain. The history is provided by the patient.  Abdominal Pain She is one-week post surgery for incarcerated hernia. She been doing well until yesterday when she started having pain in the periumbilical area with radiation to the left mid abdomen. Pain was worse during the night and is actually little bit better today. She describes the pain as sharp and crampy and she rated at 7/10. There is associated nausea but no vomiting. She had normal bowel movement yesterday and has passed flatus today. Pain is worse with standing and walking and better with laying still. She denies fever, chills, sweats. Recovery from surgery had been unremarkable until yesterday. She did take a dose of hydrocodone and acetaminophen which did not give her any relief.  Past Medical History  Diagnosis Date  . Hypertension   . Ovarian cyst   . Polycystic disease, ovaries   . GERD (gastroesophageal reflux disease)   . Polycystic kidney disease     1998  . Arthritis     LEFT KNEE    Past Surgical History  Procedure Laterality Date  . No past surgeries    . Bladder suspension  08/11/2011    Procedure: TRANSVAGINAL TAPE (TVT) PROCEDURE;  Surgeon: Emily Filbert, MD;  Location: [facility];  Service: Gynecology;  Laterality: N/A;  Add:  Cystoscopy  . Abdominal hysterectomy  2013  . Umbilical hernia repair N/A 07/04/2013    Procedure: HERNIA REPAIR UMBILICAL ;  Surgeon: Harl Bowie, MD;  Location: [facility];  Service: General;  Laterality: N/A;  . Insertion of mesh N/A 07/04/2013    Procedure: INSERTION OF MESH;  Surgeon: Harl Bowie, MD;  Location: [facility];  Service: General;  Laterality:  N/A;   Family History  Problem Relation Age of Onset  . Asthma Mother   . Hypertension Mother   . Asthma Father   . Hypertension Father    History  Substance Use Topics  . Smoking status: Current Every Day Smoker -- 0.50 packs/day for 30 years    Types: Cigarettes  . Smokeless tobacco: Never Used  . Alcohol Use: No   OB History   Grav Para Term Preterm Abortions TAB SAB Ect Mult Living   5 2 2  3 2 1   2      Review of Systems  Gastrointestinal: Positive for abdominal pain.  All other systems reviewed and are negative.     Allergies  Ace inhibitors  Home Medications   Prior to Admission medications   Medication Sig Start Date End Date Taking? Authorizing Provider  amLODipine (NORVASC) 10 MG tablet Take 1 tablet (10 mg total) by mouth daily. 04/14/13   Barton Fanny, MD  HYDROcodone-acetaminophen (NORCO) 5-325 MG per tablet Take 1-2 tablets by mouth every 4 (four) hours as needed. 07/04/13   Harl Bowie, MD  ondansetron (ZOFRAN-ODT) 4 MG disintegrating tablet Take 4 mg by mouth every 4 (four) hours as needed for nausea or vomiting.    Historical Provider, MD  traMADol (ULTRAM) 50 MG tablet Take 1 tablet (50 mg total) by mouth 2 (two) times daily. 04/14/13   Barton Fanny, MD   BP 137/82  Pulse 101  Temp(Src) 98.8 F (37.1 C) (Oral)  Resp 20  Ht 5\' 6"  (1.676 m)  Wt 169 lb (76.658 kg)  BMI 27.29 kg/m2  SpO2 97% Physical Exam  Nursing note and vitals reviewed.  54 year old female, resting comfortably and in no acute distress. Vital signs are significant for borderline tachycardia with heart rate 101. Oxygen saturation is 97%, which is normal. Head is normocephalic and atraumatic. PERRLA, EOMI. Oropharynx is clear. Neck is nontender and supple without adenopathy or JVD. Back is nontender and there is no CVA tenderness. Lungs are clear without rales, wheezes, or rhonchi. Chest is nontender. Heart has regular rate and rhythm without murmur. Abdomen  is soft, flat, with moderate periumbilical and left midabdomen tenderness. There is no rebound or guarding. There are no masses or hepatosplenomegaly and peristalsis is normoactive. Surgical scar from laparoscopic procedure and appears to be healing normally. Extremities have no cyanosis or edema, full range of motion is present. Skin is warm and dry without rash. Neurologic: Mental status is normal, cranial nerves are intact, there are no motor or sensory deficits.   ED Course  Procedures (including critical care time) Labs Review Results for orders placed during the hospital encounter of 07/12/13  CBC WITH DIFFERENTIAL      Result Value Ref Range   WBC 6.8  4.0 - 10.5 K/uL   RBC 3.95  3.87 - 5.11 MIL/uL   Hemoglobin 11.9 (*) 12.0 - 15.0 g/dL   HCT 36.4  36.0 - 46.0 %   MCV 92.2  78.0 - 100.0 fL   MCH 30.1  26.0 - 34.0 pg   MCHC 32.7  30.0 - 36.0 g/dL   RDW 14.0  11.5 - 15.5 %   Platelets 280  150 - 400 K/uL   Neutrophils Relative % 51  43 - 77 %   Neutro Abs 3.5  1.7 - 7.7 K/uL   Lymphocytes Relative 37  12 - 46 %   Lymphs Abs 2.5  0.7 - 4.0 K/uL   Monocytes Relative 8  3 - 12 %   Monocytes Absolute 0.5  0.1 - 1.0 K/uL   Eosinophils Relative 4  0 - 5 %   Eosinophils Absolute 0.2  0.0 - 0.7 K/uL   Basophils Relative 0  0 - 1 %   Basophils Absolute 0.0  0.0 - 0.1 K/uL  COMPREHENSIVE METABOLIC PANEL      Result Value Ref Range   Sodium 142  137 - 147 mEq/L   Potassium 4.4  3.7 - 5.3 mEq/L   Chloride 106  96 - 112 mEq/L   CO2 21  19 - 32 mEq/L   Glucose, Bld 111 (*) 70 - 99 mg/dL   BUN 26 (*) 6 - 23 mg/dL   Creatinine, Ser 1.79 (*) 0.50 - 1.10 mg/dL   Calcium 8.9  8.4 - 10.5 mg/dL   Total Protein 7.1  6.0 - 8.3 g/dL   Albumin 3.2 (*) 3.5 - 5.2 g/dL   AST 14  0 - 37 U/L   ALT 14  0 - 35 U/L   Alkaline Phosphatase 59  39 - 117 U/L   Total Bilirubin <0.2 (*) 0.3 - 1.2 mg/dL   GFR calc non Af Amer 32 (*) >90 mL/min   GFR calc Af Amer 37 (*) >90 mL/min  LIPASE, BLOOD       Result Value Ref Range   Lipase 34  11 - 59 U/L   Imaging Review Ct Abdomen Pelvis W Contrast  07/12/2013   CLINICAL DATA:  Abdominal pain, patient had umbilical hernia surgery a month ago and today is having left-sided pain that radiates towards the incisions age  66: CT ABDOMEN AND PELVIS WITH CONTRAST  TECHNIQUE: Multidetector CT imaging of the abdomen and pelvis was performed using the standard protocol following bolus administration of intravenous contrast.  CONTRAST:  9mL OMNIPAQUE IOHEXOL 300 MG/ML  SOLN  COMPARISON:  06/26/2013  FINDINGS: Visualized portions of the lung bases are clear.  Liver and gallbladder are normal. Spleen is normal. Pancreas is normal. There are markedly enlarged bilateral multi-cystic kidneys. The appearance is unchanged from the recent prior study. Adrenal glands are normal.  There is calcification of the abdominal aorta, stable from prior study. Appendix is normal. There is diverticulosis of the distal descending and sigmoid colon. There is no evidence of diverticulitis. There is a nonobstructive bowel gas pattern.  Reproductive organs appear to be absent. Bladder is normal. There is no free fluid.  There is evidence of midline periumbilical incision. This occurs in an area that previously demonstrated fat containing periumbilical hernia with evidence of omentum incarceration. There is a 17 mm oval organized fluid collection immediately cranial to the incision. Seen best on the sagittal image, there is subcutaneous soft tissue increased attenuation about 2 cm above and 2 cm below the umbilicus with mild subcutaneous soft tissue edematous change. There is mild anticipated postoperative change at the level of the incision further inferiorly. On the inner side of the abdominal wall at the level of the incision, there is an area of increased attenuation in the omental fat measuring 31 mm transverse by 14 mm antero posterior. There is no bowel involved with the hernia site. There  is no evidence of abdominal wall musculature hematoma.  There are no acute osseous abnormalities.  IMPRESSION: Evidence of recent abdominal wall surgery to correct periumbilical hernia. There is mild inflammatory change in the periumbilical soft tissues, which may represent anticipated postoperative change but an element of focal cellulitis is not excluded. A 17 mm fluid collection associated with this is identified, which could represent a very small abscess or postoperative hematoma/seroma. There is mild postsurgical change involving the omentum anteriorly within the abdominal cavity at the level of the incision.   Electronically Signed   By: Skipper Cliche M.D.   On: 07/12/2013 22:02   MDM   Final diagnoses:  Abdominal pain, unspecified abdominal location    Abdominal pain with recent surgery for incarcerated hernia. Exam does not show any clear cause. She will be sent for CT for further evaluation. Old records are reviewed and surgery last week appears to have been uneventful.  CT is unremarkable as is laboratory workup. Patient feels significantly better after receiving a dose of morphine. Patient is advised of these findings. Given the fact that she did not get good relief of pain with hydrocodone, she will be given a prescription for oxycodone acetaminophen and is also given a prescription for ondansetron. She is to followup with her surgeon  [place]ora Fuel, [place] [id number]

## 2013-07-12 NOTE — ED Notes (Signed)
Family at bedside. 

## 2013-07-12 NOTE — ED Notes (Signed)
Patient transported to CT 

## 2013-07-12 NOTE — ED Notes (Signed)
Patient returned from CT

## 2013-07-12 NOTE — ED Notes (Signed)
Pt states she had umbilical hernia surgery a month ago and today is having left sided pain that radiates to where her incision was made. Pt states that the pain is severe and hurts when she moves.

## 2013-07-13 MED ORDER — OXYCODONE-ACETAMINOPHEN 7.5-325 MG PO TABS: 1.0000 | ORAL_TABLET | ORAL | Status: DC | PRN

## 2013-07-13 MED ORDER — ONDANSETRON HCL 4 MG PO TABS: 4.0000 mg | ORAL_TABLET | Freq: Four times a day (QID) | ORAL | Status: DC | PRN

## 2013-07-13 NOTE — Discharge Instructions (Signed)
Return if pain is getting worse.  Abdominal Pain Many things can cause abdominal pain. Usually, abdominal pain is not caused by a disease and will improve without treatment. It can often be observed and treated at home. Your health care provider will do a physical exam and possibly order blood tests and X-rays to help determine the seriousness of your pain. However, in many cases, more time must pass before a clear cause of the pain can be found. Before that point, your health care provider may not know if you need more testing or further treatment. HOME CARE INSTRUCTIONS  Monitor your abdominal pain for any changes. The following actions may help to alleviate any discomfort you are experiencing:  Only take over-the-counter or prescription medicines as directed by your health care provider.  Do not take laxatives unless directed to do so by your health care provider.  Try a clear liquid diet (broth, tea, or water) as directed by your health care provider. Slowly move to a bland diet as tolerated. SEEK MEDICAL CARE IF:  You have unexplained abdominal pain.  You have abdominal pain associated with nausea or diarrhea.  You have pain when you urinate or have a bowel movement.  You experience abdominal pain that wakes you in the night.  You have abdominal pain that is worsened or improved by eating food.  You have abdominal pain that is worsened with eating fatty foods.  You have a fever. SEEK IMMEDIATE MEDICAL CARE IF:   Your pain does not go away within 2 hours.  You keep throwing up (vomiting).  Your pain is felt only in portions of the abdomen, such as the right side or the left lower portion of the abdomen.  You pass bloody or black tarry stools. MAKE SURE YOU:  Understand these instructions.   Will watch your condition.   Will get help right away if you are not doing well or get worse.  Document Released: 10/16/2004 Document Revised: 01/11/2013 Document Reviewed:  09/15/2012 [facility] Patient Information 2015 [place], [place]. This information is not intended to replace advice given to you by your health care provider. Make sure you discuss any questions you have with your health care provider.  Acetaminophen; Oxycodone tablets What is this medicine? ACETAMINOPHEN; OXYCODONE (a set a MEE noe fen; ox i KOE done) is a pain reliever. It is used to treat mild to moderate pain. This medicine may be used for other purposes; ask your health care provider or pharmacist if you have questions. COMMON BRAND NAME(S): Endocet, Magnacet, Narvox, Percocet, Perloxx, Primalev, Primlev, Roxicet, Xolox What should I tell my health care provider before I take this medicine? They need to know if you have any of these conditions: -brain tumor -Crohn's disease, inflammatory bowel disease, or ulcerative colitis -drug abuse or addiction -head injury -heart or circulation problems -if you often drink alcohol -kidney disease or problems going to the bathroom -liver disease -lung disease, asthma, or breathing problems -an unusual or allergic reaction to acetaminophen, oxycodone, other opioid analgesics, other medicines, foods, dyes, or preservatives -pregnant or trying to get pregnant -breast-feeding How should I use this medicine? Take this medicine by mouth with a full glass of water. Follow the directions on the prescription label. Take your medicine at regular intervals. Do not take your medicine more often than directed. Talk to your pediatrician regarding the use of this medicine in children. Special care may be needed. Patients over 72 years old may have a stronger reaction and need a smaller  dose. Overdosage: If you think you have taken too much of this medicine contact a poison control center or emergency room at once. NOTE: This medicine is only for you. Do not share this medicine with others. What if I miss a dose? If you miss a dose, take it as soon as you can. If  it is almost time for your next dose, take only that dose. Do not take double or extra doses. What may interact with this medicine? -alcohol -antihistamines -barbiturates like amobarbital, butalbital, butabarbital, methohexital, pentobarbital, phenobarbital, thiopental, and secobarbital -benztropine -drugs for bladder problems like solifenacin, trospium, oxybutynin, tolterodine, hyoscyamine, and methscopolamine -drugs for breathing problems like ipratropium and tiotropium -drugs for certain stomach or intestine problems like propantheline, homatropine methylbromide, glycopyrrolate, atropine, belladonna, and dicyclomine -general anesthetics like etomidate, ketamine, nitrous oxide, propofol, desflurane, enflurane, halothane, isoflurane, and sevoflurane -medicines for depression, anxiety, or psychotic disturbances -medicines for sleep -muscle relaxants -naltrexone -narcotic medicines (opiates) for pain -phenothiazines like perphenazine, thioridazine, chlorpromazine, mesoridazine, fluphenazine, prochlorperazine, promazine, and trifluoperazine -scopolamine -tramadol -trihexyphenidyl This list may not describe all possible interactions. Give your health care provider a list of all the medicines, herbs, non-prescription drugs, or dietary supplements you use. Also tell them if you smoke, drink alcohol, or use illegal drugs. Some items may interact with your medicine. What should I watch for while using this medicine? Tell your doctor or health care professional if your pain does not go away, if it gets worse, or if you have new or a different type of pain. You may develop tolerance to the medicine. Tolerance means that you will need a higher dose of the medication for pain relief. Tolerance is normal and is expected if you take this medicine for a long time. Do not suddenly stop taking your medicine because you may develop a severe reaction. Your body becomes used to the medicine. This does NOT mean  you are addicted. Addiction is a behavior related to getting and using a drug for a non-medical reason. If you have pain, you have a medical reason to take pain medicine. Your doctor will tell you how much medicine to take. If your doctor wants you to stop the medicine, the dose will be slowly lowered over time to avoid any side effects. You may get drowsy or dizzy. Do not drive, use machinery, or do anything that needs mental alertness until you know how this medicine affects you. Do not stand or sit up quickly, especially if you are an older patient. This reduces the risk of dizzy or fainting spells. Alcohol may interfere with the effect of this medicine. Avoid alcoholic drinks. There are different types of narcotic medicines (opiates) for pain. If you take more than one type at the same time, you may have more side effects. Give your health care provider a list of all medicines you use. Your doctor will tell you how much medicine to take. Do not take more medicine than directed. Call emergency for help if you have problems breathing. The medicine will cause constipation. Try to have a bowel movement at least every 2 to 3 days. If you do not have a bowel movement for 3 days, call your doctor or health care professional. Do not take Tylenol (acetaminophen) or medicines that have acetaminophen with this medicine. Too much acetaminophen can be very dangerous. Many nonprescription medicines contain acetaminophen. Always read the labels carefully to avoid taking more acetaminophen. What side effects may I notice from receiving this medicine? Side effects that you should  report to your doctor or health care professional as soon as possible: -allergic reactions like skin rash, itching or hives, swelling of the face, lips, or tongue -breathing difficulties, wheezing -confusion -light headedness or fainting spells -severe stomach pain -unusually weak or tired -yellowing of the skin or the whites of the  eyes Side effects that usually do not require medical attention (report to your doctor or health care professional if they continue or are bothersome): -dizziness -drowsiness -nausea -vomiting This list may not describe all possible side effects. Call your doctor for medical advice about side effects. You may report side effects to FDA at 1-800-FDA-1088. Where should I keep my medicine? Keep out of the reach of children. This medicine can be abused. Keep your medicine in a safe place to protect it from theft. Do not share this medicine with anyone. Selling or giving away this medicine is dangerous and against the law. Store at room temperature between 20 and 25 degrees C (68 and 77 degrees F). Keep container tightly closed. Protect from light. This medicine may cause accidental overdose and death if it is taken by other adults, children, or pets. Flush any unused medicine down the toilet to reduce the chance of harm. Do not use the medicine after the expiration date. NOTE: This sheet is a summary. It may not cover all possible information. If you have questions about this medicine, talk to your doctor, pharmacist, or health care provider.  2015, [employer]vier/[employer] Standard. (2012-08-30 13:17:35)  Ondansetron tablets What is this medicine? ONDANSETRON (on DAN se tron) is used to treat nausea and vomiting caused by chemotherapy. It is also used to prevent or treat nausea and vomiting after surgery. This medicine may be used for other purposes; ask your health care provider or pharmacist if you have questions. COMMON BRAND NAME(S): Zofran What should I tell my health care provider before I take this medicine? They need to know if you have any of these conditions: -heart disease -history of irregular heartbeat -liver disease -low levels of magnesium or potassium in the blood -an unusual or allergic reaction to ondansetron, granisetron, other medicines, foods, dyes, or preservatives -pregnant or  trying to get pregnant -breast-feeding How should I use this medicine? Take this medicine by mouth with a glass of water. Follow the directions on your prescription label. Take your doses at regular intervals. Do not take your medicine more often than directed. Talk to your pediatrician regarding the use of this medicine in children. Special care may be needed. Overdosage: If you think you have taken too much of this medicine contact a poison control center or emergency room at once. NOTE: This medicine is only for you. Do not share this medicine with others. What if I miss a dose? If you miss a dose, take it as soon as you can. If it is almost time for your next dose, take only that dose. Do not take double or extra doses. What may interact with this medicine? Do not take this medicine with any of the following medications: -apomorphine -certain medicines for fungal infections like fluconazole, itraconazole, ketoconazole, posaconazole, voriconazole -cisapride -dofetilide -dronedarone -pimozide -thioridazine -ziprasidone This medicine may also interact with the following medications: -carbamazepine -certain medicines for depression, anxiety, or psychotic disturbances -fentanyl -linezolid -MAOIs like Carbex, Eldepryl, Marplan, Nardil, and Parnate -methylene blue (injected into a vein) -other medicines that prolong the QT interval (cause an abnormal heart rhythm) -phenytoin -rifampicin -tramadol This list may not describe all possible interactions. Give your health care  provider a list of all the medicines, herbs, non-prescription drugs, or dietary supplements you use. Also tell them if you smoke, drink alcohol, or use illegal drugs. Some items may interact with your medicine. What should I watch for while using this medicine? Check with your doctor or health care professional right away if you have any sign of an allergic reaction. What side effects may I notice from receiving this  medicine? Side effects that you should report to your doctor or health care professional as soon as possible: -allergic reactions like skin rash, itching or hives, swelling of the face, lips or tongue -breathing problems -confusion -dizziness -fast or irregular heartbeat -feeling faint or lightheaded, falls -fever and chills -loss of balance or coordination -seizures -sweating -swelling of the hands or feet -tightness in the chest -tremors -unusually weak or tired Side effects that usually do not require medical attention (report to your doctor or health care professional if they continue or are bothersome): -constipation or diarrhea -headache This list may not describe all possible side effects. Call your doctor for medical advice about side effects. You may report side effects to FDA at 1-800-FDA-1088. Where should I keep my medicine? Keep out of the reach of children. Store between 2 and 30 degrees C (36 and 86 degrees F). Throw away any unused medicine after the expiration date. NOTE: This sheet is a summary. It may not cover all possible information. If you have questions about this medicine, talk to your doctor, pharmacist, or health care provider.  2015, Elsevier/Gold Standard. (2012-10-13 16:27:45)

## 2013-07-21 ENCOUNTER — Encounter (INDEPENDENT_AMBULATORY_CARE_PROVIDER_SITE_OTHER): Payer: Self-pay

## 2013-07-27 ENCOUNTER — Encounter (INDEPENDENT_AMBULATORY_CARE_PROVIDER_SITE_OTHER): Payer: Self-pay | Admitting: Surgery

## 2013-07-27 ENCOUNTER — Ambulatory Visit (INDEPENDENT_AMBULATORY_CARE_PROVIDER_SITE_OTHER): Payer: BC Managed Care – PPO | Admitting: Surgery

## 2013-07-27 VITALS — BP 126/72 | HR 78 | Ht 66.0 in | Wt 170.0 lb

## 2013-07-27 DIAGNOSIS — Z09 Encounter for follow-up examination after completed treatment for conditions other than malignant neoplasm: Secondary | ICD-10-CM

## 2013-07-27 NOTE — Progress Notes (Signed)
Subjective:     Patient ID: Kristen Moreno, female   DOB: 02-Aug-1960, 53 y.o.   MRN: [id number]  HPI She is here for her first postop visit status post umbilical hernia repair with mesh. She actually went to the emergency department postoperatively secondary to discomfort and was found CAT scan to have no evidence of recurrent hernia  Review of Systems     Objective:   Physical Exam On exam, her incision is well-healed there is no evidence of recurrence.    Assessment:     Patient stable postop     Plan:     She may resume normal lifting at work on the 13th. She has tolerated return to light duty. I will write her a note to return to full duty. I will see her back as needed

## 2013-10-05 ENCOUNTER — Encounter (HOSPITAL_COMMUNITY): Payer: Self-pay | Admitting: Emergency Medicine

## 2013-10-05 ENCOUNTER — Emergency Department (HOSPITAL_COMMUNITY): Payer: BC Managed Care – PPO

## 2013-10-05 ENCOUNTER — Emergency Department (HOSPITAL_COMMUNITY)
Admission: EM | Admit: 2013-10-05 | Discharge: 2013-10-05 | Disposition: A | Discharge status: Home / Self Care | Payer: BC Managed Care – PPO | Attending: Emergency Medicine | Admitting: Emergency Medicine

## 2013-10-05 DIAGNOSIS — M1710 Unilateral primary osteoarthritis, unspecified knee: Secondary | ICD-10-CM | POA: Diagnosis not present

## 2013-10-05 DIAGNOSIS — Z862 Personal history of diseases of the blood and blood-forming organs and certain disorders involving the immune mechanism: Secondary | ICD-10-CM | POA: Diagnosis not present

## 2013-10-05 DIAGNOSIS — F17200 Nicotine dependence, unspecified, uncomplicated: Secondary | ICD-10-CM | POA: Insufficient documentation

## 2013-10-05 DIAGNOSIS — M79609 Pain in unspecified limb: Secondary | ICD-10-CM | POA: Diagnosis present

## 2013-10-05 DIAGNOSIS — M7730 Calcaneal spur, unspecified foot: Secondary | ICD-10-CM | POA: Diagnosis not present

## 2013-10-05 DIAGNOSIS — Q613 Polycystic kidney, unspecified: Secondary | ICD-10-CM | POA: Diagnosis not present

## 2013-10-05 DIAGNOSIS — Z8719 Personal history of other diseases of the digestive system: Secondary | ICD-10-CM | POA: Diagnosis not present

## 2013-10-05 DIAGNOSIS — I10 Essential (primary) hypertension: Secondary | ICD-10-CM | POA: Diagnosis not present

## 2013-10-05 DIAGNOSIS — Z8639 Personal history of other endocrine, nutritional and metabolic disease: Secondary | ICD-10-CM | POA: Insufficient documentation

## 2013-10-05 DIAGNOSIS — Z8742 Personal history of other diseases of the female genital tract: Secondary | ICD-10-CM | POA: Insufficient documentation

## 2013-10-05 DIAGNOSIS — M7732 Calcaneal spur, left foot: Secondary | ICD-10-CM

## 2013-10-05 DIAGNOSIS — IMO0002 Reserved for concepts with insufficient information to code with codable children: Secondary | ICD-10-CM

## 2013-10-05 MED ORDER — NAPROXEN 500 MG PO TABS: 500.0000 mg | ORAL_TABLET | Freq: Two times a day (BID) | ORAL | Status: DC

## 2013-10-05 MED ORDER — TRAMADOL HCL 50 MG PO TABS: 50.0000 mg | ORAL_TABLET | Freq: Four times a day (QID) | ORAL | Status: DC | PRN

## 2013-10-05 NOTE — ED Notes (Signed)
Pt presents to department for evaluation of R heel pain. Ongoing x1 month. 10/10 sharp pain upon arrival. Pt ambulatory without difficulty. NAD.

## 2013-10-05 NOTE — ED Provider Notes (Signed)
CSN: [id number]     Arrival date & time 10/05/13  1745 History   First MD Initiated Contact with Patient 10/05/13 1831   This chart was scribed for non-physician practitioner Jeannett Senior, PA-C,  working with Tanna Furry, MD by Rosary Lively, ED scribe. This patient was seen in room TR04C/TR04C and the patient's care was started at 7:36 PM.    Chief Complaint  Patient presents with  . Foot Pain   The history is provided by the patient. No language interpreter was used.    HPI Comments:  Kristen Moreno is a 53 y.o. female who presents to the Emergency Department complaining of right heel pain, with associated symptoms of swelling, onset one month ago. 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 She states that one month ago, she began to develop sharp pain in her right foot.  Pt reports that she experiences severe pain when she ambulates, but denies pain when relaxing or sitting. Pt reports that she wears tennis shoes and boots to work. Pt reports that she walks on her tip-toes to relieve pain. Pt denies tingling or numbness. Pt denies taking OTC medication for symptoms.   Past Medical History  Diagnosis Date  . Hypertension   . Ovarian cyst   . Polycystic disease, ovaries   . GERD (gastroesophageal reflux disease)   . Polycystic kidney disease     1998  . Arthritis     LEFT KNEE    Past Surgical History  Procedure Laterality Date  . No past surgeries    . Bladder suspension  08/11/2011    Procedure: TRANSVAGINAL TAPE (TVT) PROCEDURE;  Surgeon: Emily Filbert, MD;  Location: [facility];  Service: Gynecology;  Laterality: N/A;  Add:  Cystoscopy  . Abdominal hysterectomy  2013  . Umbilical hernia repair N/A 07/04/2013    Procedure: HERNIA REPAIR UMBILICAL ;  Surgeon: Harl Bowie, MD;  Location: [facility];  Service: General;  Laterality: N/A;  . Insertion of mesh N/A 07/04/2013    Procedure: INSERTION OF MESH;  Surgeon: Harl Bowie, MD;  Location: [facility];  Service:  General;  Laterality: N/A;   Family History  Problem Relation Age of Onset  . Asthma Mother   . Hypertension Mother   . Asthma Father   . Hypertension Father    History  Substance Use Topics  . Smoking status: Current Every Day Smoker -- 0.50 packs/day for 30 years    Types: Cigarettes  . Smokeless tobacco: Never Used  . Alcohol Use: No   OB History   Grav Para Term Preterm Abortions TAB SAB Ect Mult Living   5 2 2  3 2 1   2      Review of Systems  Constitutional: Negative for fever and chills.  Musculoskeletal: Positive for arthralgias and myalgias.  Neurological: Negative for numbness.      Allergies  Ace inhibitors  Home Medications   Prior to Admission medications   Medication Sig Start Date End Date Taking? Authorizing Provider  acetaminophen (TYLENOL) 500 MG tablet Take 500 mg by mouth every 6 (six) hours as needed for mild pain.   Yes Historical Provider, MD  amLODipine (NORVASC) 10 MG tablet Take 10 mg by mouth at bedtime. 04/14/13  Yes Barton Fanny, MD   BP 157/120  Pulse 91  Temp(Src) 98.1 F (36.7 C) (Oral)  Resp 18  SpO2 100% Physical Exam  Nursing note and vitals reviewed. Constitutional: She is oriented to  person, place, and time. She appears well-developed and well-nourished.  HENT:  Head: Normocephalic and atraumatic.  Eyes: EOM are normal.  Neck: Normal range of motion. Neck supple.  Cardiovascular: Normal rate.   Pulmonary/Chest: Effort normal.  Musculoskeletal: Normal range of motion.  Normal appearing right foot. Tender to palpation over the heel. No skin lesions or wounds. Normal rest of the foot. Normal toe flexion extension. Ankle joint is normal.  Neurological: She is alert and oriented to person, place, and time.  Skin: Skin is warm and dry.  Psychiatric: She has a normal mood and affect. Her behavior is normal.    ED Course  Procedures  DIAGNOSTIC STUDIES: Oxygen Saturation is 100% on RA, normal by my  interpretation.  COORDINATION OF CARE: 7:40 PM-Discussed treatment plan which includes xray with pt at bedside and pt agreed to plan.  Labs Review Labs Reviewed - No data to display  Imaging Review Dg Foot Complete Right  10/05/2013   CLINICAL DATA:  Pain of right foot localizing to the heel.  EXAM: RIGHT FOOT COMPLETE - 3+ VIEW  COMPARISON:  None.  FINDINGS: No acute fracture or dislocation identified. Small plantar calcaneal spur present. No evidence of bony lesion or destruction. No significant proliferative or erosive arthropathy.  IMPRESSION: Small plantar calcaneal spur.  No acute fracture.   Electronically Signed   By: Aletta Edouard M.D.   On: 10/05/2013 21:31     EKG Interpretation None      MDM   Final diagnoses:  Calcaneal spur of foot, left    The patient is here for calcaneal pain going on for a month. X-ray shows small calcaneal spur. I suspect patient's symptoms are most likely due to plantar fasciitis. Will discharge home with NSAIDs, tramadol, ice, rest, followup with primary care physician. Advised to get orthotics. Ice massage.  Filed Vitals:   10/05/13 1755 10/05/13 2214  BP: 157/120 139/91  Pulse: 91 70  Temp: 98.1 F (36.7 C)   TempSrc: Oral   Resp: 18 14  SpO2: 100% 100%     I personally performed the services described in this documentation, which was scribed in my presence. The recorded information has been reviewed and is accurate.    Renold Genta, PA-C 10/05/13 2221

## 2013-10-05 NOTE — Discharge Instructions (Signed)
Ice you foot. Elevate. Naprosyn for pain ultram for severe pain. Follow up with your doctor for recheck. Make sure to wear supported shoes. Stay off of your feet as much as possible.    Heel Spur A heel spur is a hook of bone that can form on the calcaneus (the heel bone and the largest bone of the foot). Heel spurs are often associated with plantar fasciitis and usually come in people who have had the problem for an extended period of time. The cause of the relationship is unknown. The pain associated with them is thought to be caused by an inflammation (soreness and redness) of the plantar fascia rather than the spur itself. The plantar fascia is a thick fibrous like tissue that runs from the calcaneus (heel bone) to the ball of the foot. This strong, tight tissue helps maintain the arch of your foot. It helps distribute the weight across your foot as you walk or run. Stresses placed on the plantar fascia can be tremendous. When it is inflamed normal activities become painful. Pain is worse in the morning after sleeping. After sleeping the plantar fascia is tight. The first movements stretch the fascia and this causes pain. As the tendon loosens, the pain usually gets better. It often returns with too much standing or walking.  About 70% of patients with plantar fasciitis have a heel spur. About half of people without foot pain also have heel spurs. DIAGNOSIS  The diagnosis of a heel spur is made by X-ray. The X-ray shows a hook of bone protruding from the bottom of the calcaneus at the point where the plantar fascia is attached to the heel bone.  TREATMENT  It is necessary to find out what is causing the stretching of the plantar fascia. If the cause is over-pronation (flat feet), orthotics and proper foot ware may help.  Stretching exercises, losing weight, wearing shoes that have a cushioned heel that absorbs shock, and elevating the heel with the use of a heel cradle, heel cup, or orthotics may all  help. Heel cradles and heel cups provide extra comfort and cushion to the heel, and reduce the amount of shock to the sore area. AVOIDING THE PAIN OF PLANTAR FASCIITIS AND HEEL SPURS  Consult a sports medicine professional before beginning a new exercise program.  Walking programs offer a good workout. There is a lower chance of overuse injuries common to the runners. There is less impact and less jarring of the joints.  Begin all new exercise programs slowly. If problems or pains develop, decrease the amount of time or distance until you are at a comfortable level.  Wear good shoes and replace them regularly.  Stretch your foot and the heel cords at the back of the ankle (Achilles tendons) both before and after exercise.  Run or exercise on even surfaces that are not hard. For example, asphalt is better than pavement.  Do not run barefoot on hard surfaces.  If using a treadmill, vary the incline.  Do not continue to workout if you have foot or joint problems. Seek professional help if they do not improve. HOME CARE INSTRUCTIONS   Avoid activities that cause you pain until you recover.  Use ice or cold packs to the problem or painful areas after working out.  Only take over-the-counter or prescription medicines for pain, discomfort, or fever as directed by your caregiver.  Soft shoe inserts or athletic shoes with air or gel sole cushions may be helpful.  If problems  continue or become more severe, consult a sports medicine caregiver. Cortisone is a potent anti-inflammatory medication that may be injected into the painful area. You can discuss this treatment with your caregiver. MAKE SURE YOU:   Understand these instructions.  Will watch your condition.  Will get help right away if you are not doing well or get worse. Document Released: 02/12/2005 Document Revised: 03/31/2011 Document Reviewed: 03/09/2013 [facility] Patient Information 2015 [place], [place]. This information is not  intended to replace advice given to you by your health care provider. Make sure you discuss any questions you have with your health care provider.

## 2013-10-13 NOTE — ED Provider Notes (Signed)
Medical screening examination/treatment/procedure(s) were performed by non-physician practitioner and as supervising physician I was immediately available for consultation/collaboration.   EKG Interpretation None        Aiden Helzer, MD 10/13/13 0701 

## 2013-10-28 ENCOUNTER — Encounter: Payer: Self-pay | Admitting: Family Medicine

## 2013-10-28 ENCOUNTER — Ambulatory Visit (INDEPENDENT_AMBULATORY_CARE_PROVIDER_SITE_OTHER): Payer: BC Managed Care – PPO | Admitting: Family Medicine

## 2013-10-28 VITALS — BP 139/84 | HR 85 | Temp 98.4°F | Resp 16 | Ht 65.0 in | Wt 170.0 lb

## 2013-10-28 DIAGNOSIS — I10 Essential (primary) hypertension: Secondary | ICD-10-CM

## 2013-10-28 DIAGNOSIS — M67431 Ganglion, right wrist: Secondary | ICD-10-CM

## 2013-10-28 DIAGNOSIS — M71331 Other bursal cyst, right wrist: Secondary | ICD-10-CM

## 2013-10-28 DIAGNOSIS — M7731 Calcaneal spur, right foot: Secondary | ICD-10-CM

## 2013-10-28 MED ORDER — PREDNISONE 5 MG PO TABS: ORAL_TABLET | ORAL | Status: DC

## 2013-10-28 MED ORDER — TRAMADOL HCL 50 MG PO TABS: ORAL_TABLET | ORAL | Status: DC

## 2013-10-28 MED ORDER — AMLODIPINE BESYLATE 10 MG PO TABS: 10.0000 mg | ORAL_TABLET | Freq: Every day | ORAL | Status: DC

## 2013-10-28 NOTE — Patient Instructions (Signed)
PREDNISONE DOSE PACK TAPER-  Take all doses with meal or snack.  On Day 1, take 2 tabs with 3 meals. Take 1 less tablet each day (in divided doses). It will take 6 days for you to take all the tablets. Continue to ice your foot and try to do some stretching exercises each morning before getting out of bed.   Plantar Fasciitis Plantar fasciitis is a common condition that causes foot pain. It is soreness (inflammation) of the band of tough fibrous tissue on the bottom of the foot that runs from the heel bone (calcaneus) to the ball of the foot. The cause of this soreness may be from excessive standing, poor fitting shoes, running on hard surfaces, being overweight, having an abnormal walk, or overuse (this is common in runners) of the painful foot or feet. It is also common in aerobic exercise dancers and ballet dancers. SYMPTOMS  Most people with plantar fasciitis complain of:  Severe pain in the morning on the bottom of their foot especially when taking the first steps out of bed. This pain recedes after a few minutes of walking.  Severe pain is experienced also during walking following a long period of inactivity.  Pain is worse when walking barefoot or up stairs DIAGNOSIS   Your caregiver will diagnose this condition by examining and feeling your foot.  Special tests such as X-rays of your foot, are usually not needed. PREVENTION   Consult a sports medicine professional before beginning a new exercise program.  Walking programs offer a good workout. With walking there is a lower chance of overuse injuries common to runners. There is less impact and less jarring of the joints.  Begin all new exercise programs slowly. If problems or pain develop, decrease the amount of time or distance until you are at a comfortable level.  Wear good shoes and replace them regularly.  Stretch your foot and the heel cords at the back of the ankle (Achilles tendon) both before and after exercise.  Run or  exercise on even surfaces that are not hard. For example, asphalt is better than pavement.  Do not run barefoot on hard surfaces.  If using a treadmill, vary the incline.  Do not continue to workout if you have foot or joint problems. Seek professional help if they do not improve. HOME CARE INSTRUCTIONS   Avoid activities that cause you pain until you recover.  Use ice or cold packs on the problem or painful areas after working out.  Only take over-the-counter or prescription medicines for pain, discomfort, or fever as directed by your caregiver.  Soft shoe inserts or athletic shoes with air or gel sole cushions may be helpful.  If problems continue or become more severe, consult a sports medicine caregiver or your own health care provider. Cortisone is a potent anti-inflammatory medication that may be injected into the painful area. You can discuss this treatment with your caregiver. MAKE SURE YOU:   Understand these instructions.  Will watch your condition.  Will get help right away if you are not doing well or get worse. Document Released: 10/01/2000 Document Revised: 03/31/2011 Document Reviewed: 12/01/2007 [facility] Patient Information 2015 [place], [place]. This information is not intended to replace advice given to you by your health care provider. Make sure you discuss any questions you have with your health care provider.

## 2013-10-28 NOTE — Progress Notes (Signed)
S:  This 53 y.o. AA female is here today for eval of persistent severe R heel pain. She was seen in ED on 10/05/13 and diagnosed with calcaneal heel spur. Pain now for > 2 months; she is on her feet for hours, walking on a concrete floor. Treatment plan was followed by pt- NSAIDs and Tramadol, ice to area and orthotics. She purchased new shoes to wear at work. Pain has not abated; sharp pains and cramping occur intermittently. She takes a Tramadol only at bedtime due to sedation. She cannot bear weight in the morning when she attempts to get out of bed. At work, she often rides around in a cart so that she does not have to ambulate; her boss acknowledges her painful condition and makes accommodations.  Pt is R-handed and has a small mildly painful knot on back of wrist. It comes and goes. Not associated w/ numbness or weakness in her hand. Thinks a brace would help but has to have permission to wear one at work.  HTN- Pt has well controlled HTN and is compliant w/ medication. She denies fatigue, diaphoresis, abnormal weight gain, edema, CP or tightness, palpitations, SOB or DOE, cough, HA, dizziness, lightheadedness, numbness, weakness or syncope.  Patient Active Problem List   Diagnosis Date Noted  . Incarcerated umbilical hernia [id number]  . Polycystic kidney disease 12/16/2011  . Chronic renal insufficiency 12/16/2011  . Hypertension 12/16/2011  . S/P hysterectomy 12/16/2011  . Hot flashes 12/16/2011    Prior to Admission medications   Medication Sig Start Date End Date Taking? Authorizing Provider  amLODipine (NORVASC) 10 MG tablet Take 1 tablet (10 mg total) by mouth at bedtime. 07/12/2013  Yes Barton Fanny, MD  acetaminophen (TYLENOL) 500 MG tablet Take 500 mg by mouth every 6 (six) hours as needed for mild pain.    Historical Provider, MD  naproxen (NAPROSYN) 500 MG tablet Take 1 tablet (500 mg total) by mouth 2 (two) times daily. 10/05/13   Tatyana A Kirichenko, PA-C  traMADol  (ULTRAM) 50 MG tablet Take 1 tablet by mouth  every 6 hours as needed.        SOC and FAM Hx reviewed.  ROS: As per HPI.   O: Filed Vitals:   10/28/13 1555  BP: 139/84  Pulse: 85  Temp: 98.4 F (36.9 C)  Resp: 16   GEN: In NAD; WN, WD. HENT: [place]/AT; EOMI w/ clear conj/sclerae. Otherwise unremarkable. COR: RRR. No edema. LUNGS: Unlabored resp. SKIN: W&D; intact w/o erythema, lesions or rashes. MS: R wrist- Normal appearance w/ 1 cm soft, NT nodule on dorsum of wrist. FROM.  R heel- Normal appearance w/ minimal swelling over plantar aspect of calcaneus. Achilles tendon intact. Tender plantar aspect of heel; good ROM but pain w/ extreme flexion. Neurovascular intact. NEURO: A&O x 3; CNs intact. Nonfocal. Gait abnormal- pt not bearing weight fully on R foot/ avoiding normal heel strike phase.   Reveiied xray image from 10/05/13 ED visit- Small plantar calcaneal spur.   A/P: Heel spur, right - Continue Tramadol at bedtime. Trial Prednisone taper over 6 days. Continue protective footwear and limit weight-bearing.  Plan: Ambulatory referral to Podiatry  Synovial cyst of wrist, right- Monitor; pt will inquire about wearing a wrist brace at work.  Essential hypertension- Stable and controlled on current medication. Refill x 6 months.  Meds ordered this encounter  Medications  . amLODipine (NORVASC) 10 MG tablet    Sig: Take 1 tablet (10 mg total) by mouth at  bedtime.    Dispense:  30 tablet    Refill:  5  . traMADol (ULTRAM) 50 MG tablet    Sig: Take 1 tablet every 8 hours prn pain or 2 tablets hs for pain.    Dispense:  40 tablet    Refill:  0  . predniSONE (DELTASONE) 5 MG tablet    Sig: Take tablets as directed; take all doses with food and taper as directed.    Dispense:  21 tablet    Refill:  0

## 2013-11-07 ENCOUNTER — Emergency Department (HOSPITAL_COMMUNITY)
Admission: EM | Admit: 2013-11-07 | Discharge: 2013-11-07 | Disposition: A | Discharge status: Home / Self Care | Payer: BC Managed Care – PPO | Attending: Emergency Medicine | Admitting: Emergency Medicine

## 2013-11-07 ENCOUNTER — Emergency Department (HOSPITAL_COMMUNITY): Payer: BC Managed Care – PPO

## 2013-11-07 ENCOUNTER — Encounter (HOSPITAL_COMMUNITY): Payer: Self-pay | Admitting: Emergency Medicine

## 2013-11-07 DIAGNOSIS — M1990 Unspecified osteoarthritis, unspecified site: Secondary | ICD-10-CM | POA: Insufficient documentation

## 2013-11-07 DIAGNOSIS — Z8719 Personal history of other diseases of the digestive system: Secondary | ICD-10-CM | POA: Insufficient documentation

## 2013-11-07 DIAGNOSIS — Z79899 Other long term (current) drug therapy: Secondary | ICD-10-CM | POA: Diagnosis not present

## 2013-11-07 DIAGNOSIS — I10 Essential (primary) hypertension: Secondary | ICD-10-CM | POA: Diagnosis not present

## 2013-11-07 DIAGNOSIS — Z3202 Encounter for pregnancy test, result negative: Secondary | ICD-10-CM | POA: Insufficient documentation

## 2013-11-07 DIAGNOSIS — Z90710 Acquired absence of both cervix and uterus: Secondary | ICD-10-CM | POA: Diagnosis not present

## 2013-11-07 DIAGNOSIS — Z8742 Personal history of other diseases of the female genital tract: Secondary | ICD-10-CM | POA: Diagnosis not present

## 2013-11-07 DIAGNOSIS — R1084 Generalized abdominal pain: Secondary | ICD-10-CM | POA: Diagnosis not present

## 2013-11-07 DIAGNOSIS — Z7952 Long term (current) use of systemic steroids: Secondary | ICD-10-CM | POA: Diagnosis not present

## 2013-11-07 DIAGNOSIS — Z9889 Other specified postprocedural states: Secondary | ICD-10-CM | POA: Insufficient documentation

## 2013-11-07 DIAGNOSIS — J159 Unspecified bacterial pneumonia: Secondary | ICD-10-CM | POA: Diagnosis not present

## 2013-11-07 DIAGNOSIS — J189 Pneumonia, unspecified organism: Secondary | ICD-10-CM

## 2013-11-07 DIAGNOSIS — R079 Chest pain, unspecified: Secondary | ICD-10-CM | POA: Diagnosis present

## 2013-11-07 DIAGNOSIS — Q613 Polycystic kidney, unspecified: Secondary | ICD-10-CM | POA: Insufficient documentation

## 2013-11-07 DIAGNOSIS — Z720 Tobacco use: Secondary | ICD-10-CM | POA: Insufficient documentation

## 2013-11-07 LAB — CBC
HCT: 37.5 % (ref 36.0–46.0)
Hemoglobin: 12.4 g/dL (ref 12.0–15.0)
MCH: 30.5 pg (ref 26.0–34.0)
MCHC: 33.1 g/dL (ref 30.0–36.0)
MCV: 92.4 fL (ref 78.0–100.0)
Platelets: 278 10*3/uL (ref 150–400)
RBC: 4.06 MIL/uL (ref 3.87–5.11)
RDW: 14.1 % (ref 11.5–15.5)
WBC: 8 10*3/uL (ref 4.0–10.5)

## 2013-11-07 LAB — URINALYSIS, ROUTINE W REFLEX MICROSCOPIC
Bilirubin Urine: NEGATIVE
Glucose, UA: NEGATIVE mg/dL
Hgb urine dipstick: NEGATIVE
Ketones, ur: NEGATIVE mg/dL
Leukocytes, UA: NEGATIVE
Nitrite: NEGATIVE
Protein, ur: NEGATIVE mg/dL
Specific Gravity, Urine: 1.009 (ref 1.005–1.030)
Urobilinogen, UA: 0.2 mg/dL (ref 0.0–1.0)
pH: 5.5 (ref 5.0–8.0)

## 2013-11-07 LAB — BASIC METABOLIC PANEL
Anion gap: 16 — ABNORMAL HIGH (ref 5–15)
BUN: 24 mg/dL — ABNORMAL HIGH (ref 6–23)
CO2: 20 mEq/L (ref 19–32)
Calcium: 9.8 mg/dL (ref 8.4–10.5)
Chloride: 104 mEq/L (ref 96–112)
Creatinine, Ser: 1.65 mg/dL — ABNORMAL HIGH (ref 0.50–1.10)
GFR calc Af Amer: 41 mL/min — ABNORMAL LOW (ref >90)
GFR calc non Af Amer: 35 mL/min — ABNORMAL LOW (ref >90)
Glucose, Bld: 97 mg/dL (ref 70–99)
Potassium: 4.7 mEq/L (ref 3.7–5.3)
Sodium: 140 mEq/L (ref 137–147)

## 2013-11-07 LAB — POC URINE PREG, ED: Preg Test, Ur: NEGATIVE

## 2013-11-07 LAB — HEPATIC FUNCTION PANEL
ALT: 24 U/L (ref 0–35)
AST: 29 U/L (ref 0–37)
Albumin: 3.1 g/dL — ABNORMAL LOW (ref 3.5–5.2)
Alkaline Phosphatase: 67 U/L (ref 39–117)
Bilirubin, Direct: <0.2 mg/dL (ref 0.0–0.3)
Total Bilirubin: <0.2 mg/dL — ABNORMAL LOW (ref 0.3–1.2)
Total Protein: 7.7 g/dL (ref 6.0–8.3)

## 2013-11-07 LAB — LIPASE, BLOOD: Lipase: 52 U/L (ref 11–59)

## 2013-11-07 LAB — I-STAT TROPONIN, ED: Troponin i, poc: 0.01 ng/mL (ref 0.00–0.08)

## 2013-11-07 LAB — TROPONIN I: Troponin I: <0.3 ng/mL (ref <0.30)

## 2013-11-07 MED ORDER — AMOXICILLIN 500 MG PO CAPS
500.0000 mg | ORAL_CAPSULE | Freq: Once | ORAL | Status: AC
  Administered 2013-11-07: 500 mg via ORAL
  Filled 2013-11-07: qty 1

## 2013-11-07 MED ORDER — AZITHROMYCIN 250 MG PO TABS
1000.0000 mg | ORAL_TABLET | Freq: Once | ORAL | Status: AC
  Administered 2013-11-07: 1000 mg via ORAL
  Filled 2013-11-07: qty 4

## 2013-11-07 MED ORDER — ALBUTEROL SULFATE HFA 108 (90 BASE) MCG/ACT IN AERS
2.0000 | INHALATION_SPRAY | Freq: Once | RESPIRATORY_TRACT | Status: AC
  Administered 2013-11-07: 2 via RESPIRATORY_TRACT
  Filled 2013-11-07: qty 6.7

## 2013-11-07 MED ORDER — AZITHROMYCIN 250 MG PO TABS: 250.0000 mg | ORAL_TABLET | Freq: Every day | ORAL | Status: DC

## 2013-11-07 MED ORDER — DOXYCYCLINE HYCLATE 100 MG PO TABS: 100.0000 mg | ORAL_TABLET | Freq: Once | ORAL | Status: DC

## 2013-11-07 MED ORDER — HYDROCODONE-ACETAMINOPHEN 5-325 MG PO TABS: 1.0000 | ORAL_TABLET | ORAL | Status: DC | PRN

## 2013-11-07 MED ORDER — ASPIRIN 325 MG PO TABS
325.0000 mg | ORAL_TABLET | ORAL | Status: AC
  Administered 2013-11-07: 325 mg via ORAL
  Filled 2013-11-07: qty 1

## 2013-11-07 MED ORDER — AMOXICILLIN 500 MG PO CAPS: 500.0000 mg | ORAL_CAPSULE | Freq: Three times a day (TID) | ORAL | Status: DC

## 2013-11-07 MED ORDER — NITROGLYCERIN 0.4 MG SL SUBL: 0.4000 mg | SUBLINGUAL_TABLET | SUBLINGUAL | Status: DC | PRN

## 2013-11-07 NOTE — ED Notes (Signed)
Pt c/o generalized CP worse with inspiration and gen abd pain x several days worse today

## 2013-11-07 NOTE — Discharge Instructions (Signed)
Read the information below.  Use the prescribed medication as directed.  Please discuss all new medications with your pharmacist.  Do not take additional tylenol while taking the prescribed pain medication to avoid overdose.  You may return to the Emergency Department at any time for worsening condition or any new symptoms that concern you.  If you develop worsening chest pain, shortness of breath, fever, you pass out, or become weak or dizzy, return to the ER for a recheck.      Pneumonia Pneumonia is an infection of the lungs.  CAUSES Pneumonia may be caused by bacteria or a virus. Usually, these infections are caused by breathing infectious particles into the lungs (respiratory tract). SIGNS AND SYMPTOMS   Cough.  Fever.  Chest pain.  Increased rate of breathing.  Wheezing.  Mucus production. DIAGNOSIS  If you have the common symptoms of pneumonia, your health care provider will typically confirm the diagnosis with a chest X-ray. The X-ray will show an abnormality in the lung (pulmonary infiltrate) if you have pneumonia. Other tests of your blood, urine, or sputum may be done to find the specific cause of your pneumonia. Your health care provider may also do tests (blood gases or pulse oximetry) to see how well your lungs are working. TREATMENT  Some forms of pneumonia may be spread to other people when you cough or sneeze. You may be asked to wear a mask before and during your exam. Pneumonia that is caused by bacteria is treated with antibiotic medicine. Pneumonia that is caused by the influenza virus may be treated with an antiviral medicine. Most other viral infections must run their course. These infections will not respond to antibiotics.  HOME CARE INSTRUCTIONS   Cough suppressants may be used if you are losing too much rest. However, coughing protects you by clearing your lungs. You should avoid using cough suppressants if you can.  Your health care provider may have prescribed  medicine if he or she thinks your pneumonia is caused by bacteria or influenza. Finish your medicine even if you start to feel better.  Your health care provider may also prescribe an expectorant. This loosens the mucus to be coughed up.  Take medicines only as directed by your health care provider.  Do not smoke. Smoking is a common cause of bronchitis and can contribute to pneumonia. If you are a smoker and continue to smoke, your cough may last several weeks after your pneumonia has cleared.  A cold steam vaporizer or humidifier in your room or home may help loosen mucus.  Coughing is often worse at night. Sleeping in a semi-upright position in a recliner or using a couple pillows under your head will help with this.  Get rest as you feel it is needed. Your body will usually let you know when you need to rest. PREVENTION A pneumococcal shot (vaccine) is available to prevent a common bacterial cause of pneumonia. This is usually suggested for:  People over 96 years old.  Patients on chemotherapy.  People with chronic lung problems, such as bronchitis or emphysema.  People with immune system problems. If you are over 65 or have a high risk condition, you may receive the pneumococcal vaccine if you have not received it before. In some countries, a routine influenza vaccine is also recommended. This vaccine can help prevent some cases of pneumonia.You may be offered the influenza vaccine as part of your care. If you smoke, it is time to quit. You may receive instructions  on how to stop smoking. Your health care provider can provide medicines and counseling to help you quit. SEEK MEDICAL CARE IF: You have a fever. SEEK IMMEDIATE MEDICAL CARE IF:   Your illness becomes worse. This is especially true if you are elderly or weakened from any other disease.  You cannot control your cough with suppressants and are losing sleep.  You begin coughing up blood.  You develop pain which is  getting worse or is uncontrolled with medicines.  Any of the symptoms which initially brought you in for treatment are getting worse rather than better.  You develop shortness of breath or chest pain. MAKE SURE YOU:   Understand these instructions.  Will watch your condition.  Will get help right away if you are not doing well or get worse. Document Released: 01/06/2005 Document Revised: 05/23/2013 Document Reviewed: 03/28/2010 [facility] Patient Information 2015 [place], [place]. This information is not intended to replace advice given to you by your health care provider. Make sure you discuss any questions you have with your health care provider.
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
## 2013-11-07 NOTE — ED Provider Notes (Signed)
History/physical exam/procedure(s) were performed by non-physician practitioner and as supervising physician I was immediately available for consultation/collaboration. I have reviewed all notes and am in agreement with care and plan.   Shaune Pollack, MD 11/07/13 [phone]

## 2013-11-07 NOTE — ED Notes (Signed)
EKG completed in triage by NT at 1023

## 2013-11-11 ENCOUNTER — Ambulatory Visit (INDEPENDENT_AMBULATORY_CARE_PROVIDER_SITE_OTHER): Payer: BC Managed Care – PPO

## 2013-11-11 ENCOUNTER — Encounter: Payer: Self-pay | Admitting: Podiatry

## 2013-11-11 ENCOUNTER — Ambulatory Visit (INDEPENDENT_AMBULATORY_CARE_PROVIDER_SITE_OTHER): Payer: BLUE CROSS/BLUE SHIELD | Admitting: Podiatry

## 2013-11-11 VITALS — BP 130/77 | HR 76 | Resp 16 | Ht 65.0 in | Wt 170.0 lb

## 2013-11-11 DIAGNOSIS — M79671 Pain in right foot: Secondary | ICD-10-CM

## 2013-11-11 DIAGNOSIS — M722 Plantar fascial fibromatosis: Secondary | ICD-10-CM

## 2013-11-11 DIAGNOSIS — M629 Disorder of muscle, unspecified: Secondary | ICD-10-CM

## 2013-11-11 NOTE — Progress Notes (Signed)
   Subjective:    Patient ID: Kristen Moreno, female    DOB: [birth year], 53 y.o.   MRN: [id number]  HPI Comments: "My heel is hurting"  [name], 52 year old female, presents the office today with complaints of right heel pain which is been present for approximately 2 months. She states that the pain started in the morning and she did get some relief with activity however the pain is becoming more chronic. She saw her primary care physician who prescribed prednisone which helped. She is also been applying ice to the area. She denies any specific injury or trauma to the area. No other complaints this time.  Foot Pain Associated symptoms include arthralgias, coughing and headaches.      Review of Systems  HENT: Positive for sneezing.   Respiratory: Positive for cough.   Musculoskeletal: Positive for arthralgias and gait problem.  Neurological: Positive for headaches.  All other systems reviewed and are negative.      Objective:   Physical Exam AAO x3, NAD DP/PT pulses palpable bilaterally, CRT less than 3 seconds Protective sensation intact with Simms Weinstein monofilament, vibratory sensation intact, Achilles tendon reflex intact Tenderness to palpation of the plantar aspect of the right calcaneus at insertion of plantar fascia. There is pain along the course of the plantar fascia with the arch of the foot. Upon dorsiflexion of the hallux the plantar fascia is not palpable along the medial or central band compared to the extremity. There is mild pain with vibratory sensation over the calcaneus. No edema, erythema, increased warmth. There is no open lesions. No calf pain with compression, swelling, warmth.        Assessment & Plan:  53 year old female with possible right foot  plantar fascial tear, possible avulsion/fracture. -X-Rays were obtained and reviewed with the patient. There is any area of inferior aspect of the tuberosity concerning for avulsion. -Conservative versus  surgical treatment discussed including alternatives, risks, complications. -At this time to to the inability to palpate the plantar fascia with x-ray findings will obtain MRI. -Dispensed CAM boot for immobilization. Risks and complications of immobilization discussed with the patient, including but not limited, to DVT. If any are to occur, to go directly to the ED.  -Ice/Elevation -Follow up after MRI, or sooner if needed. Call the office with any questions/concerns/change in symptoms. Discussed if she did not hear about scheduling the MRI within the next few days, call our office. Follow up with PCP for other issues mentioned in ROS.

## 2013-11-15 ENCOUNTER — Encounter: Payer: Self-pay | Admitting: Podiatry

## 2013-11-15 ENCOUNTER — Telehealth: Payer: Self-pay | Admitting: *Deleted

## 2013-11-15 NOTE — Telephone Encounter (Signed)
I called you earlier this morning.  I forgot to leave my phone number.  Reason I'm calling is I am waiting to hear about a MRI to get done on my right foot.  Am I supposed to get a phone call to make an appointment.  Also, my employer has taken me out of work because of the light duty you all put me on.  I need to talk to someone about that, I need to work and I don't know what to do about it.  Please give me a call back thank you, have a blessed day.

## 2013-11-16 NOTE — Telephone Encounter (Signed)
I called the patient to ensure she got her MRI scheduled.  She stated, "I did, it is scheduled for Friday at 10 o'clock."  I told her I was just following up, I know you had called and left a message about it.

## 2013-11-18 ENCOUNTER — Ambulatory Visit
Admission: RE | Admit: 2013-11-18 | Discharge: 2013-11-18 | Disposition: A | Discharge status: Home / Self Care | Payer: BC Managed Care – PPO | Source: Ambulatory Visit | Attending: Podiatry | Admitting: Podiatry

## 2013-11-18 DIAGNOSIS — M79671 Pain in right foot: Secondary | ICD-10-CM

## 2013-11-18 DIAGNOSIS — M629 Disorder of muscle, unspecified: Secondary | ICD-10-CM

## 2013-11-21 ENCOUNTER — Encounter: Payer: Self-pay | Admitting: Podiatry

## 2013-11-25 ENCOUNTER — Ambulatory Visit (INDEPENDENT_AMBULATORY_CARE_PROVIDER_SITE_OTHER): Payer: BC Managed Care – PPO | Admitting: Podiatry

## 2013-11-25 ENCOUNTER — Encounter: Payer: Self-pay | Admitting: Podiatry

## 2013-11-25 VITALS — BP 154/90 | HR 86 | Resp 12

## 2013-11-25 DIAGNOSIS — M79671 Pain in right foot: Secondary | ICD-10-CM

## 2013-11-25 DIAGNOSIS — M722 Plantar fascial fibromatosis: Secondary | ICD-10-CM

## 2013-11-25 NOTE — Patient Instructions (Signed)
Plantar Fasciitis (Heel Spur Syndrome) with Rehab The plantar fascia is a fibrous, ligament-like, soft-tissue structure that spans the bottom of the foot. Plantar fasciitis is a condition that causes pain in the foot due to inflammation of the tissue. SYMPTOMS   Pain and tenderness on the underneath side of the foot.  Pain that worsens with standing or walking. CAUSES  Plantar fasciitis is caused by irritation and injury to the plantar fascia on the underneath side of the foot. Common mechanisms of injury include:  Direct trauma to bottom of the foot.  Damage to a small nerve that runs under the foot where the main fascia attaches to the heel bone.  Stress placed on the plantar fascia due to bone spurs. RISK INCREASES WITH:   Activities that place stress on the plantar fascia (running, jumping, pivoting, or cutting).  Poor strength and flexibility.  Improperly fitted shoes.  Tight calf muscles.  Flat feet.  Failure to warm-up properly before activity.  Obesity. PREVENTION  Warm up and stretch properly before activity.  Allow for adequate recovery between workouts.  Maintain physical fitness:  Strength, flexibility, and endurance.  Cardiovascular fitness.  Maintain a health body weight.  Avoid stress on the plantar fascia.  Wear properly fitted shoes, including arch supports for individuals who have flat feet. PROGNOSIS  If treated properly, then the symptoms of plantar fasciitis usually resolve without surgery. However, occasionally surgery is necessary. RELATED COMPLICATIONS   Recurrent symptoms that may result in a chronic condition.  Problems of the lower back that are caused by compensating for the injury, such as limping.  Pain or weakness of the foot during push-off following surgery.  Chronic inflammation, scarring, and partial or complete fascia tear, occurring more often from repeated injections. TREATMENT  Treatment initially involves the use of  ice and medication to help reduce pain and inflammation. The use of strengthening and stretching exercises may help reduce pain with activity, especially stretches of the Achilles tendon. These exercises may be performed at home or with a therapist. Your caregiver may recommend that you use heel cups of arch supports to help reduce stress on the plantar fascia. Occasionally, corticosteroid injections are given to reduce inflammation. If symptoms persist for greater than 6 months despite non-surgical (conservative), then surgery may be recommended.  MEDICATION   If pain medication is necessary, then nonsteroidal anti-inflammatory medications, such as aspirin and ibuprofen, or other minor pain relievers, such as acetaminophen, are often recommended.  Do not take pain medication within 7 days before surgery.  Prescription pain relievers may be given if deemed necessary by your caregiver. Use only as directed and only as much as you need.  Corticosteroid injections may be given by your caregiver. These injections should be reserved for the most serious cases, because they may only be given a certain number of times. HEAT AND COLD  Cold treatment (icing) relieves pain and reduces inflammation. Cold treatment should be applied for 10 to 15 minutes every 2 to 3 hours for inflammation and pain and immediately after any activity that aggravates your symptoms. Use ice packs or massage the area with a piece of ice (ice massage).  Heat treatment may be used prior to performing the stretching and strengthening activities prescribed by your caregiver, physical therapist, or athletic trainer. Use a heat pack or soak the injury in warm water. SEEK IMMEDIATE MEDICAL CARE IF:  Treatment seems to offer no benefit, or the condition worsens.  Any medications produce adverse side effects. EXERCISES RANGE   OF MOTION (ROM) AND STRETCHING EXERCISES - Plantar Fasciitis (Heel Spur Syndrome) These exercises may help you  when beginning to rehabilitate your injury. Your symptoms may resolve with or without further involvement from your physician, physical therapist or athletic trainer. While completing these exercises, remember:   Restoring tissue flexibility helps normal motion to return to the joints. This allows healthier, less painful movement and activity.  An effective stretch should be held for at least 30 seconds.  A stretch should never be painful. You should only feel a gentle lengthening or release in the stretched tissue. RANGE OF MOTION - Toe Extension, Flexion  Sit with your right / left leg crossed over your opposite knee.  Grasp your toes and gently pull them back toward the top of your foot. You should feel a stretch on the bottom of your toes and/or foot.  Hold this stretch for __________ seconds.  Now, gently pull your toes toward the bottom of your foot. You should feel a stretch on the top of your toes and or foot.  Hold this stretch for __________ seconds. Repeat __________ times. Complete this stretch __________ times per day.  RANGE OF MOTION - Ankle Dorsiflexion, Active Assisted  Remove shoes and sit on a chair that is preferably not on a carpeted surface.  Place right / left foot under knee. Extend your opposite leg for support.  Keeping your heel down, slide your right / left foot back toward the chair until you feel a stretch at your ankle or calf. If you do not feel a stretch, slide your bottom forward to the edge of the chair, while still keeping your heel down.  Hold this stretch for __________ seconds. Repeat __________ times. Complete this stretch __________ times per day.  STRETCH - Gastroc, Standing  Place hands on wall.  Extend right / left leg, keeping the front knee somewhat bent.  Slightly point your toes inward on your back foot.  Keeping your right / left heel on the floor and your knee straight, shift your weight toward the wall, not allowing your back to  arch.  You should feel a gentle stretch in the right / left calf. Hold this position for __________ seconds. Repeat __________ times. Complete this stretch __________ times per day. STRETCH - Soleus, Standing  Place hands on wall.  Extend right / left leg, keeping the other knee somewhat bent.  Slightly point your toes inward on your back foot.  Keep your right / left heel on the floor, bend your back knee, and slightly shift your weight over the back leg so that you feel a gentle stretch deep in your back calf.  Hold this position for __________ seconds. Repeat __________ times. Complete this stretch __________ times per day. STRETCH - Gastrocsoleus, Standing  Note: This exercise can place a lot of stress on your foot and ankle. Please complete this exercise only if specifically instructed by your caregiver.   Place the ball of your right / left foot on a step, keeping your other foot firmly on the same step.  Hold on to the wall or a rail for balance.  Slowly lift your other foot, allowing your body weight to press your heel down over the edge of the step.  You should feel a stretch in your right / left calf.  Hold this position for __________ seconds.  Repeat this exercise with a slight bend in your right / left knee. Repeat __________ times. Complete this stretch __________ times per day.    STRENGTHENING EXERCISES - Plantar Fasciitis (Heel Spur Syndrome)  These exercises may help you when beginning to rehabilitate your injury. They may resolve your symptoms with or without further involvement from your physician, physical therapist or athletic trainer. While completing these exercises, remember:   Muscles can gain both the endurance and the strength needed for everyday activities through controlled exercises.  Complete these exercises as instructed by your physician, physical therapist or athletic trainer. Progress the resistance and repetitions only as guided. STRENGTH -  Towel Curls  Sit in a chair positioned on a non-carpeted surface.  Place your foot on a towel, keeping your heel on the floor.  Pull the towel toward your heel by only curling your toes. Keep your heel on the floor.  If instructed by your physician, physical therapist or athletic trainer, add ____________________ at the end of the towel. Repeat __________ times. Complete this exercise __________ times per day. STRENGTH - Ankle Inversion  Secure one end of a rubber exercise band/tubing to a fixed object (table, pole). Loop the other end around your foot just before your toes.  Place your fists between your knees. This will focus your strengthening at your ankle.  Slowly, pull your big toe up and in, making sure the band/tubing is positioned to resist the entire motion.  Hold this position for __________ seconds.  Have your muscles resist the band/tubing as it slowly pulls your foot back to the starting position. Repeat __________ times. Complete this exercises __________ times per day.  Document Released: 01/06/2005 Document Revised: 03/31/2011 Document Reviewed: 04/20/2008 [employer] Patient Information 2015 [employer]. This information is not intended to replace advice given to you by your health care provider. Make sure you discuss any questions you have with your health care provider.  

## 2013-11-28 NOTE — Progress Notes (Signed)
Patient ID: Kristen Moreno, female   DOB: 20-Jun-1960, 53 y.o.   MRN: [id number]  Subjective: Kristen Moreno, 53 year old female, presents the office they fall evaluation of right foot heel pain and to be discussed the results of the MRI. She's been continuing to wear the CAM walker which she states helps alleviate some of the symptoms. She states that when she comes out of the CAM Walker, symptoms return. She states that she is unable to wear the boot while working and she has been unable to work since last appointment. Denies any acute changes since last appointment. No other complaints at this time.  Objective: AAO x3, NAD DP/PT pulses palpable bilaterally, CRT less than 3 seconds Protective sensation intact with Simms Weinstein monofilament, vibratory sensation intact, Achilles tendon reflex intact Tenderness to palpation over the plantar medial tubercle of the calcaneus on the right foot along the insertion of the plantar fascia and just distal to the area. There is no pain with lateral compression of the calcaneus or with vibratory sensation. No pain along the posterior aspect of the calcaneus or along the course the Achilles tendon. Decrease in medial arch height upon weightbearing. MMT 5/5, ROM WNL No calf pain with compression, swelling, warmth, erythema.  No open lesions.  Assessment: 53 year old female with right foot plantar fasciitis.  Plan: -MRI results were discussed with the patient. -At this time conservative versus surgical treatment discussed including alternatives, risks, complications. -At this time the patient wishes to hold off on any steroid injections. -Discussed with her stretching exercises. -Ice to the affected area. -Dispensed plantar fascial brace to wear when wearing a shoe -I discussed with her supportive shoe gear and possible inserts to help support her foot type while working.  -At this time patient needs to return to a shoe in order to work. I discussed with her  to wear the CAM boot as needed or if symptoms increase/return. She can return to work with light duty as tolerated. If symptoms increase or worsen she is informed to back off on the activity and call the office.  -Follow-up in 3 weeks or sooner if any problems are to arise or any change in symptoms. In the meantime call the office with any questions, concerns.

## 2013-12-23 ENCOUNTER — Ambulatory Visit: Payer: BC Managed Care – PPO | Admitting: Podiatry

## 2013-12-30 ENCOUNTER — Ambulatory Visit: Payer: BC Managed Care – PPO | Admitting: Podiatry

## 2014-01-11 ENCOUNTER — Ambulatory Visit (INDEPENDENT_AMBULATORY_CARE_PROVIDER_SITE_OTHER): Payer: BC Managed Care – PPO | Admitting: Podiatry

## 2014-01-11 ENCOUNTER — Encounter: Payer: Self-pay | Admitting: Podiatry

## 2014-01-11 VITALS — BP 137/77 | HR 99 | Resp 18

## 2014-01-11 DIAGNOSIS — M722 Plantar fascial fibromatosis: Secondary | ICD-10-CM

## 2014-01-11 MED ORDER — TRIAMCINOLONE ACETONIDE 10 MG/ML IJ SUSP
10.0000 mg | Freq: Once | INTRAMUSCULAR | Status: AC
  Administered 2014-01-11: 10 mg

## 2014-01-11 NOTE — Progress Notes (Signed)
Patient ID: Kristen Moreno, female   DOB: 05-26-1960, 53 y.o.   MRN: [id number]  Subjective: 53 year old female returns the office they for follow-up evaluation of right heel pain, plantar fasciitis. She states that she continues to have pain in the area. She states it is better when resting a however during work she continues to have pain. She continues with light duty for work. She's been stretching as well as icing. She states the plantar fascial brace has not been helping. No acute changes since last appointment and no other complaints at this time. Denies any systemic complaints as fevers, chills, nausea, vomiting.  Objective: AAO x3, NAD DP/PT pulses palpable bilaterally, CRT less than 3 seconds Protective sensation intact with Simms Weinstein monofilament, vibratory sensation intact, Achilles tendon reflex intact There is tenderness to palpation of on the plantar medial tubercle of the calcaneus on the right foot at the insertion of the plantar fascia. There is no pain along the course of plantar fascial in the arch of the foot. There is no pain with lateral compression of the calcaneus or pain with vibratory sensation. No pain on the posterior aspect of calcaneus or along the course/insertion of the Achilles tendon. No overlying edema, erythema, increased warmth. No areas of pinpoint bony tenderness or pain with vibratory sensation bilaterally. MMT 5/5, ROM WNL No pain with calf compression, swelling, warmth, erythema. No open lesions or pre-ulcerative lesions.  Assessment: 53 year old female with right foot plantar fasciitis  Plan: -Treatment options were discussed the patient both conservative and surgical including alternatives, risks, complications. -At this time due to lack of improvement in symptoms recommended a steroid injection to hopefully help decrease some the symptoms. Patient brought the consent to the injection. Under sterile conditions a total of 2 mL of a mixture of Kenalog  10, 0.5% Marcaine plain and 2% lidocaine plain was infiltrated into the area of maximal tenderness around the plantar fascia. Bandage was applied. Patient tolerated the injection well without any consultations. Discussed with patient ice the area over the next couple days to help prevent a steroid flare. -A plantar fascial taping was applied. -Continue ice  to the area. -Continue stretching exercises.  -Discussed shoe gear modification and possible orthotics.  -Follow-up in 3 weeks or sooner should any problems arise. In the meantime, call the office with any questions, concerns, changes symptoms. At next appointment we'll possibly referred to physical therapy if she continues to have symptoms or continue with other conservative measures.

## 2014-01-11 NOTE — Patient Instructions (Signed)
Plantar Fasciitis (Heel Spur Syndrome) with Rehab The plantar fascia is a fibrous, ligament-like, soft-tissue structure that spans the bottom of the foot. Plantar fasciitis is a condition that causes pain in the foot due to inflammation of the tissue. SYMPTOMS   Pain and tenderness on the underneath side of the foot.  Pain that worsens with standing or walking. CAUSES  Plantar fasciitis is caused by irritation and injury to the plantar fascia on the underneath side of the foot. Common mechanisms of injury include:  Direct trauma to bottom of the foot.  Damage to a small nerve that runs under the foot where the main fascia attaches to the heel bone.  Stress placed on the plantar fascia due to bone spurs. RISK INCREASES WITH:   Activities that place stress on the plantar fascia (running, jumping, pivoting, or cutting).  Poor strength and flexibility.  Improperly fitted shoes.  Tight calf muscles.  Flat feet.  Failure to warm-up properly before activity.  Obesity. PREVENTION  Warm up and stretch properly before activity.  Allow for adequate recovery between workouts.  Maintain physical fitness:  Strength, flexibility, and endurance.  Cardiovascular fitness.  Maintain a health body weight.  Avoid stress on the plantar fascia.  Wear properly fitted shoes, including arch supports for individuals who have flat feet. PROGNOSIS  If treated properly, then the symptoms of plantar fasciitis usually resolve without surgery. However, occasionally surgery is necessary. RELATED COMPLICATIONS   Recurrent symptoms that may result in a chronic condition.  Problems of the lower back that are caused by compensating for the injury, such as limping.  Pain or weakness of the foot during push-off following surgery.  Chronic inflammation, scarring, and partial or complete fascia tear, occurring more often from repeated injections. TREATMENT  Treatment initially involves the use of  ice and medication to help reduce pain and inflammation. The use of strengthening and stretching exercises may help reduce pain with activity, especially stretches of the Achilles tendon. These exercises may be performed at home or with a therapist. Your caregiver may recommend that you use heel cups of arch supports to help reduce stress on the plantar fascia. Occasionally, corticosteroid injections are given to reduce inflammation. If symptoms persist for greater than 6 months despite non-surgical (conservative), then surgery may be recommended.  MEDICATION   If pain medication is necessary, then nonsteroidal anti-inflammatory medications, such as aspirin and ibuprofen, or other minor pain relievers, such as acetaminophen, are often recommended.  Do not take pain medication within 7 days before surgery.  Prescription pain relievers may be given if deemed necessary by your caregiver. Use only as directed and only as much as you need.  Corticosteroid injections may be given by your caregiver. These injections should be reserved for the most serious cases, because they may only be given a certain number of times. HEAT AND COLD  Cold treatment (icing) relieves pain and reduces inflammation. Cold treatment should be applied for 10 to 15 minutes every 2 to 3 hours for inflammation and pain and immediately after any activity that aggravates your symptoms. Use ice packs or massage the area with a piece of ice (ice massage).  Heat treatment may be used prior to performing the stretching and strengthening activities prescribed by your caregiver, physical therapist, or athletic trainer. Use a heat pack or soak the injury in warm water. SEEK IMMEDIATE MEDICAL CARE IF:  Treatment seems to offer no benefit, or the condition worsens.  Any medications produce adverse side effects. EXERCISES RANGE   OF MOTION (ROM) AND STRETCHING EXERCISES - Plantar Fasciitis (Heel Spur Syndrome) These exercises may help you  when beginning to rehabilitate your injury. Your symptoms may resolve with or without further involvement from your physician, physical therapist or athletic trainer. While completing these exercises, remember:   Restoring tissue flexibility helps normal motion to return to the joints. This allows healthier, less painful movement and activity.  An effective stretch should be held for at least 30 seconds.  A stretch should never be painful. You should only feel a gentle lengthening or release in the stretched tissue. RANGE OF MOTION - Toe Extension, Flexion  Sit with your right / left leg crossed over your opposite knee.  Grasp your toes and gently pull them back toward the top of your foot. You should feel a stretch on the bottom of your toes and/or foot.  Hold this stretch for __________ seconds.  Now, gently pull your toes toward the bottom of your foot. You should feel a stretch on the top of your toes and or foot.  Hold this stretch for __________ seconds. Repeat __________ times. Complete this stretch __________ times per day.  RANGE OF MOTION - Ankle Dorsiflexion, Active Assisted  Remove shoes and sit on a chair that is preferably not on a carpeted surface.  Place right / left foot under knee. Extend your opposite leg for support.  Keeping your heel down, slide your right / left foot back toward the chair until you feel a stretch at your ankle or calf. If you do not feel a stretch, slide your bottom forward to the edge of the chair, while still keeping your heel down.  Hold this stretch for __________ seconds. Repeat __________ times. Complete this stretch __________ times per day.  STRETCH - Gastroc, Standing  Place hands on wall.  Extend right / left leg, keeping the front knee somewhat bent.  Slightly point your toes inward on your back foot.  Keeping your right / left heel on the floor and your knee straight, shift your weight toward the wall, not allowing your back to  arch.  You should feel a gentle stretch in the right / left calf. Hold this position for __________ seconds. Repeat __________ times. Complete this stretch __________ times per day. STRETCH - Soleus, Standing  Place hands on wall.  Extend right / left leg, keeping the other knee somewhat bent.  Slightly point your toes inward on your back foot.  Keep your right / left heel on the floor, bend your back knee, and slightly shift your weight over the back leg so that you feel a gentle stretch deep in your back calf.  Hold this position for __________ seconds. Repeat __________ times. Complete this stretch __________ times per day. STRETCH - Gastrocsoleus, Standing  Note: This exercise can place a lot of stress on your foot and ankle. Please complete this exercise only if specifically instructed by your caregiver.   Place the ball of your right / left foot on a step, keeping your other foot firmly on the same step.  Hold on to the wall or a rail for balance.  Slowly lift your other foot, allowing your body weight to press your heel down over the edge of the step.  You should feel a stretch in your right / left calf.  Hold this position for __________ seconds.  Repeat this exercise with a slight bend in your right / left knee. Repeat __________ times. Complete this stretch __________ times per day.    STRENGTHENING EXERCISES - Plantar Fasciitis (Heel Spur Syndrome)  These exercises may help you when beginning to rehabilitate your injury. They may resolve your symptoms with or without further involvement from your physician, physical therapist or athletic trainer. While completing these exercises, remember:   Muscles can gain both the endurance and the strength needed for everyday activities through controlled exercises.  Complete these exercises as instructed by your physician, physical therapist or athletic trainer. Progress the resistance and repetitions only as guided. STRENGTH -  Towel Curls  Sit in a chair positioned on a non-carpeted surface.  Place your foot on a towel, keeping your heel on the floor.  Pull the towel toward your heel by only curling your toes. Keep your heel on the floor.  If instructed by your physician, physical therapist or athletic trainer, add ____________________ at the end of the towel. Repeat __________ times. Complete this exercise __________ times per day. STRENGTH - Ankle Inversion  Secure one end of a rubber exercise band/tubing to a fixed object (table, pole). Loop the other end around your foot just before your toes.  Place your fists between your knees. This will focus your strengthening at your ankle.  Slowly, pull your big toe up and in, making sure the band/tubing is positioned to resist the entire motion.  Hold this position for __________ seconds.  Have your muscles resist the band/tubing as it slowly pulls your foot back to the starting position. Repeat __________ times. Complete this exercises __________ times per day.  Document Released: 01/06/2005 Document Revised: 03/31/2011 Document Reviewed: 04/20/2008 [employer] Patient Information 2015 [employer]. This information is not intended to replace advice given to you by your health care provider. Make sure you discuss any questions you have with your health care provider.  

## 2014-01-25 ENCOUNTER — Telehealth: Payer: Self-pay

## 2014-01-30 NOTE — Telephone Encounter (Signed)
No msg °

## 2014-02-01 ENCOUNTER — Ambulatory Visit: Payer: BC Managed Care – PPO | Admitting: Podiatry

## 2014-02-15 ENCOUNTER — Ambulatory Visit: Payer: Self-pay | Admitting: Podiatry

## 2014-02-24 ENCOUNTER — Encounter: Payer: Self-pay | Admitting: Podiatry

## 2014-02-24 ENCOUNTER — Ambulatory Visit (INDEPENDENT_AMBULATORY_CARE_PROVIDER_SITE_OTHER): Payer: BLUE CROSS/BLUE SHIELD | Admitting: Podiatry

## 2014-02-24 VITALS — BP 143/76 | HR 82 | Resp 12

## 2014-02-24 DIAGNOSIS — M722 Plantar fascial fibromatosis: Secondary | ICD-10-CM

## 2014-02-24 MED ORDER — TRIAMCINOLONE ACETONIDE 10 MG/ML IJ SUSP: 10.0000 mg | Freq: Once | INTRAMUSCULAR | Status: DC

## 2014-02-27 NOTE — Progress Notes (Signed)
Patient ID: Kristen Moreno, female   DOB: Apr 17, 1960, 54 y.o.   MRN: [id number]  Subjective: 54 year old female returns the office if up evaluation of bilateral heel pain. She states that after last appointment she had relief of symptoms for approximate 2 weeks before the pain started to recur. She states that she has pain in the morning which is relieved by ambulation and after seeing for long periods of time. She states that she's been doing the stretching exercises however she has not been as diligent with icing. She presents today wearing regular sneaker. Denies any recent swelling or redness overlying the area. No other complaints at this time.  Objective: AAO x3, NAD DP/PT pulses palpable bilaterally, CRT less than 3 seconds Protective sensation intact with Simms Weinstein monofilament, vibratory sensation intact, Achilles tendon reflex intact Tenderness to palpation overlying the plantar medial tubercle of the calcaneus to bilateral heels with the right greater than left at the insertion of the plantar fascia. There is no pain along the course of plantar fascial within the arch of the foot. There is no pain with lateral compression of the calcaneus or pain the vibratory sensation. No pain on the posterior aspect of the calcaneus or along the course/insertion of the Achilles tendon. There is no overlying edema, erythema, increase in warmth. No other areas of tenderness palpation or pain with vibratory sensation to the foot/ankle to bilateral lower extremities. MMT 5/5, ROM WNL No open lesions or pre-ulcerative lesions are identified. No pain with calf compression, swelling, warmth, erythema.  Assessment: 54 year old female with bilateral heel pain/plantar fasciitis  Plan: -Treatment options were discussed the patient including alternatives, risks, complications. -Patient elects to proceed with steroid injection into bilateral heels. Under sterile skin preparation, a total of 2.5cc of kenalog  10, 0.5% Marcaine plain, and 2% lidocaine plain were infiltrated into the symptomatic area without complication. A band-aid was applied. Patient tolerated the injection well without complication. Post-injection care with discussed with the patient. Discussed with the patient to ice the area over the next couple of days to help prevent a steroid flare.  -Night splint was dispensed. -Continue with supportive shoe gear is discussed possible orthotics. -Continue ice and stretching exercises. -We'll hold off on anti-inflammatories given renal insufficiency. -Follow-up in 3 weeks or sooner should any problems arise. In the meantime, encouraged to call the office with any questions, concerns, change in symptoms.

## 2014-03-17 ENCOUNTER — Encounter: Payer: Self-pay | Admitting: Podiatry

## 2014-03-17 ENCOUNTER — Ambulatory Visit (INDEPENDENT_AMBULATORY_CARE_PROVIDER_SITE_OTHER): Payer: BLUE CROSS/BLUE SHIELD | Admitting: Podiatry

## 2014-03-17 VITALS — BP 140/81 | HR 85 | Resp 11

## 2014-03-17 DIAGNOSIS — M79671 Pain in right foot: Secondary | ICD-10-CM

## 2014-03-17 DIAGNOSIS — M722 Plantar fascial fibromatosis: Secondary | ICD-10-CM

## 2014-03-20 NOTE — Progress Notes (Signed)
Patient ID: Kristen Moreno, female   DOB: 22-Jun-1960, 55 y.o.   MRN: [id number]  Subjective: 54 year old female returns the office if up evaluation of bilateral heel pain. She states that since last appointment she is doing great and has no pain to her heels at this time. She has continued on light duty for work and is asking to go back to full duty. She denies any swelling or redness over the area. Denies any pain at night. She has continued with stretching and icing activities. She presents today wearing regular sneaker. No other complaints at this time.  Objective: AAO x3, NAD DP/PT pulses palpable bilaterally, CRT less than 3 seconds Protective sensation intact with Simms Weinstein monofilament, vibratory sensation intact, Achilles tendon reflex intact Currently, there is no tenderness to palpation overlying the plantar medial tubercle of the calcaneus to bilateral heels.There is no pain along the course of plantar fascial within the arch of the foot. There is no pain with lateral compression of the calcaneus or pain the vibratory sensation. No pain on the posterior aspect of the calcaneus or along the course/insertion of the Achilles tendon. There is no overlying edema, erythema, increase in warmth. No other areas of tenderness palpation or pain with vibratory sensation to the foot/ankle to bilateral lower extremities. MMT 5/5, ROM WNL No open lesions or pre-ulcerative lesions are identified. No pain with calf compression, swelling, warmth, erythema.  Assessment: 54 year old female with resolved bilateral heel pain.   Plan: -Treatment options were discussed the patient including alternatives, risks, complications. -At this time as her symptoms resolve discussed long-term treatments to help prevent recurrence. She stands all day and get her foot type would recommend custom orthotics. She is a proceed with these. The patient scanned for orthotics and sent to [facility] labs. -Continue ice and  stretching and she agrees. -Follow-up after orthotics arrive or sooner if any problems are to arise. In the meantime, encouraged to call the office with any questions, concerns, change in symptoms.

## 2014-04-07 ENCOUNTER — Ambulatory Visit: Payer: BLUE CROSS/BLUE SHIELD | Admitting: *Deleted

## 2014-04-07 DIAGNOSIS — M722 Plantar fascial fibromatosis: Secondary | ICD-10-CM

## 2014-04-07 NOTE — Patient Instructions (Signed)

## 2014-04-07 NOTE — Progress Notes (Signed)
Patient ID: Kristen Moreno, female   DOB: 07-Mar-1960, 54 y.o.   MRN: [id number] PICKING UP INSERTS

## 2014-05-02 ENCOUNTER — Encounter: Payer: Self-pay | Admitting: Family Medicine

## 2014-05-02 ENCOUNTER — Ambulatory Visit (INDEPENDENT_AMBULATORY_CARE_PROVIDER_SITE_OTHER): Payer: BLUE CROSS/BLUE SHIELD | Admitting: Family Medicine

## 2014-05-02 VITALS — BP 136/82 | HR 76 | Temp 98.0°F | Resp 16 | Ht 65.5 in | Wt 179.2 lb

## 2014-05-02 DIAGNOSIS — R232 Flushing: Secondary | ICD-10-CM

## 2014-05-02 DIAGNOSIS — I10 Essential (primary) hypertension: Secondary | ICD-10-CM

## 2014-05-02 DIAGNOSIS — N951 Menopausal and female climacteric states: Secondary | ICD-10-CM | POA: Diagnosis not present

## 2014-05-02 DIAGNOSIS — G479 Sleep disorder, unspecified: Secondary | ICD-10-CM

## 2014-05-02 DIAGNOSIS — Z720 Tobacco use: Secondary | ICD-10-CM

## 2014-05-02 DIAGNOSIS — Z0000 Encounter for general adult medical examination without abnormal findings: Secondary | ICD-10-CM

## 2014-05-02 DIAGNOSIS — K219 Gastro-esophageal reflux disease without esophagitis: Secondary | ICD-10-CM

## 2014-05-02 DIAGNOSIS — Z1239 Encounter for other screening for malignant neoplasm of breast: Secondary | ICD-10-CM | POA: Diagnosis not present

## 2014-05-02 DIAGNOSIS — Z13220 Encounter for screening for lipoid disorders: Secondary | ICD-10-CM | POA: Diagnosis not present

## 2014-05-02 DIAGNOSIS — R0683 Snoring: Secondary | ICD-10-CM | POA: Diagnosis not present

## 2014-05-02 LAB — LIPID PANEL
Cholesterol: 231 mg/dL — ABNORMAL HIGH (ref 0–200)
HDL: 45 mg/dL — ABNORMAL LOW (ref >=46)
LDL Cholesterol: 149 mg/dL — ABNORMAL HIGH (ref 0–99)
Total CHOL/HDL Ratio: 5.1 Ratio
Triglycerides: 183 mg/dL — ABNORMAL HIGH (ref <150)
VLDL: 37 mg/dL (ref 0–40)

## 2014-05-02 LAB — COMPLETE METABOLIC PANEL WITH GFR
ALT: 12 U/L (ref 0–35)
AST: 15 U/L (ref 0–37)
Albumin: 3.7 g/dL (ref 3.5–5.2)
Alkaline Phosphatase: 57 U/L (ref 39–117)
BUN: 25 mg/dL — ABNORMAL HIGH (ref 6–23)
CO2: 24 mEq/L (ref 19–32)
Calcium: 10 mg/dL (ref 8.4–10.5)
Chloride: 106 mEq/L (ref 96–112)
Creat: 1.94 mg/dL — ABNORMAL HIGH (ref 0.50–1.10)
GFR, Est African American: 34 mL/min — ABNORMAL LOW
GFR, Est Non African American: 29 mL/min — ABNORMAL LOW
Glucose, Bld: 93 mg/dL (ref 70–99)
Potassium: 4.1 mEq/L (ref 3.5–5.3)
Sodium: 140 mEq/L (ref 135–145)
Total Bilirubin: 0.2 mg/dL (ref 0.2–1.2)
Total Protein: 7.2 g/dL (ref 6.0–8.3)

## 2014-05-02 LAB — POCT URINALYSIS DIPSTICK
Bilirubin, UA: NEGATIVE
Blood, UA: NEGATIVE
Glucose, UA: NEGATIVE
Ketones, UA: NEGATIVE
Leukocytes, UA: NEGATIVE
Nitrite, UA: NEGATIVE
Protein, UA: 30
Spec Grav, UA: <=1.005
Urobilinogen, UA: 0.2
pH, UA: 5.5

## 2014-05-02 MED ORDER — OMEPRAZOLE 20 MG PO CPDR: 20.0000 mg | DELAYED_RELEASE_CAPSULE | Freq: Every day | ORAL | Status: DC

## 2014-05-02 MED ORDER — AMLODIPINE BESYLATE 10 MG PO TABS: 10.0000 mg | ORAL_TABLET | Freq: Every day | ORAL | Status: DC

## 2014-05-02 NOTE — Patient Instructions (Signed)

## 2014-05-02 NOTE — Progress Notes (Signed)
Subjective:    Patient ID: Kristen Moreno, female    DOB: [birth year], 54 y.o.   MRN: [id number]  HPI  This 54 y.o female is here for CPE. GYN care provided by Dr. Clovia Cuff. Pt c/o GERD, treated in past w/ PPI. Pt requests refill; having a lot of heartburn not effectively relieved w/ Zantac OTC.  HCM: MMG- Ordered last year but pt did not go; re-ordered today.           PAP- June 2013 (negative); S/P TAH in 2013.           CRS- Current (normal in Jan 2015 w/ 10-yr recall).           IMM- Currnet Tdap.           Vision- Advised optometry eval.           Dental-   Patient Active Problem List   Diagnosis Date Noted  . GERD (gastroesophageal reflux disease) 05/02/2014  . Tobacco user 05/02/2014  . Incarcerated umbilical hernia 123XX123  . Polycystic kidney disease 12/16/2011  . Chronic renal insufficiency 12/16/2011  . Hypertension 12/16/2011  . S/P hysterectomy 12/16/2011  . Hot flashes 12/16/2011    Prior to Admission medications   Medication Sig Start Date End Date Taking? Authorizing Provider  amLODipine (NORVASC) 10 MG tablet Take 1 tablet (10 mg total) by mouth at bedtime.   Yes Barton Fanny, MD  ranitidine (ZANTAC) 150 MG tablet Take 150 mg by mouth 2 (two) times daily.   Yes Historical Provider, MD              Past Surgical History  Procedure Laterality Date  . No past surgeries    . Bladder suspension  08/11/2011    Procedure: TRANSVAGINAL TAPE (TVT) PROCEDURE;  Surgeon: Emily Filbert, MD;  Location: [facility];  Service: Gynecology;  Laterality: N/A;  Add:  Cystoscopy  . Abdominal hysterectomy  2013  . Umbilical hernia repair N/A 07/04/2013    Procedure: HERNIA REPAIR UMBILICAL ;  Surgeon: Harl Bowie, MD;  Location: [facility];  Service: General;  Laterality: N/A;  . Insertion of mesh N/A 07/04/2013    Procedure: INSERTION OF MESH;  Surgeon: Harl Bowie, MD;  Location: [facility];  Service: General;  Laterality: N/A;    History   Social History  .  Marital Status: Legally Separated    Spouse Name: N/A  . Number of Children: N/A  . Years of Education: N/A   Occupational History  . supervisor    Social History Main Topics  . Smoking status: Current Every Day Smoker -- 0.50 packs/day for 30 years    Types: Cigarettes  . Smokeless tobacco: Never Used  . Alcohol Use: No     Comment: social  . Drug Use: No  . Sexual Activity: Yes   Other Topics Concern  . Not on file   Social History Narrative   Single. Exercise: Yes.    Family History  Problem Relation Age of Onset  . Asthma Mother   . Hypertension Mother   . Asthma Father   . Hypertension Father     Review of Systems  Constitutional: Positive for fatigue.       Daytime sleepiness.  Eyes: Positive for visual disturbance.  Respiratory: Positive for stridor. Negative for cough and choking.        Snores.  Cardiovascular: Negative.   Gastrointestinal: Negative.   Endocrine: Negative.   Genitourinary: Negative.   Musculoskeletal: Negative.  Skin: Negative.   Allergic/Immunologic: Negative.   Neurological: Positive for headaches.  Hematological: Negative.   Psychiatric/Behavioral: Negative.       Objective:   Physical Exam  Constitutional: She is oriented to person, place, and time. Vital signs are normal. She appears well-developed and well-nourished. No distress.  Blood pressure 136/82, pulse 76, temperature 98 F (36.7 C), temperature source Oral, resp. rate 16, height 5' 5.5" (1.664 m), weight 179 lb 3.2 oz (81.285 kg), SpO2 99 %.    HENT:  Head: Normocephalic and atraumatic.  Right Ear: Hearing, tympanic membrane, external ear and ear canal normal.  Left Ear: Hearing, tympanic membrane, external ear and ear canal normal.  Nose: Nose normal. No nasal deformity or septal deviation.  Mouth/Throat: Uvula is midline, oropharynx is clear and moist and mucous membranes are normal. No oral lesions. Normal dentition. No dental caries.  Eyes: Conjunctivae, EOM  and lids are normal. Pupils are equal, round, and reactive to light. No scleral icterus.  Neck: Trachea normal, normal range of motion and phonation normal. Neck supple. No spinous process tenderness and no muscular tenderness present. Normal range of motion present. No thyroid mass and no thyromegaly present.  Cardiovascular: Normal rate, regular rhythm, S1 normal, S2 normal, normal heart sounds and normal pulses.   No extrasystoles are present. PMI is not displaced.  Exam reveals no gallop and no friction rub.   No murmur heard. Pulmonary/Chest: Effort normal and breath sounds normal. No respiratory distress. She has no decreased breath sounds. She has no wheezes. She has no rales. Right breast exhibits no inverted nipple, no mass, no nipple discharge, no skin change and no tenderness. Left breast exhibits no inverted nipple, no mass, no nipple discharge, no skin change and no tenderness. Breasts are symmetrical.  Abdominal: Soft. Normal appearance, normal aorta and bowel sounds are normal. She exhibits no distension and no mass. There is no hepatosplenomegaly. There is no tenderness. There is no guarding and no CVA tenderness.  Musculoskeletal:       Cervical back: Normal.       Thoracic back: Normal.       Lumbar back: Normal.  Remainder of exam unremarkable.  Lymphadenopathy:       Head (right side): No submental, no submandibular, no tonsillar, no preauricular, no posterior auricular and no occipital adenopathy present.       Head (left side): No submental, no submandibular, no tonsillar, no preauricular, no posterior auricular and no occipital adenopathy present.    She has no cervical adenopathy.    She has no axillary adenopathy.       Right: No inguinal and no supraclavicular adenopathy present.       Left: No inguinal and no supraclavicular adenopathy present.  Neurological: She is alert and oriented to person, place, and time. She has normal strength and normal reflexes. She displays no  atrophy. No cranial nerve deficit or sensory deficit. She exhibits normal muscle tone. She displays a negative Romberg sign. Coordination and gait normal.  Skin: Skin is warm, dry and intact. No ecchymosis, no lesion and no rash noted. She is not diaphoretic. No cyanosis or erythema. Nails show no clubbing.  Psychiatric: She has a normal mood and affect. Her speech is normal and behavior is normal. Judgment and thought content normal. Cognition and memory are normal.  Nursing note and vitals reviewed.      Assessment & Plan:  Routine history and physical examination of adult  Gastroesophageal reflux disease without esophagitis- RX: omeprazole  20 mg 1 cap daily for 3 months then reduce use to 1 cap every other day until out of medication; if symptoms recur, will need GI evaluation. Smoking cessation encouraged. Can continue Zantac hs.  Screening for breast cancer - Plan: MM Digital Screening  Hot flashes- Symptomatic treatment; may try new OTC product.  Essential hypertension- Stable on current medication- amlodipine 10 mg daily.  Tobacco user - Advised pt that ongoing tobacco use aggravates GERD. Plan: POCT urinalysis dipstick, COMPLETE METABOLIC PANEL WITH GFR  Sleep disorder - Plan: Ambulatory referral to Neurology, Nocturnal polysomnography (NPSG)  Snoring - Plan: Ambulatory referral to Neurology, Nocturnal polysomnography (NPSG)  Screening for hyperlipidemia - Plan: Lipid panel   Meds ordered this encounter  Medications  . ranitidine (ZANTAC) 150 MG tablet    Sig: Take 150 mg by mouth 2 (two) times daily.  Marland Kitchen amLODipine (NORVASC) 10 MG tablet    Sig: Take 1 tablet (10 mg total) by mouth at bedtime.    Dispense:  30 tablet    Refill:  11  . omeprazole (PRILOSEC) 20 MG capsule    Sig: Take 1 capsule (20 mg total) by mouth daily.    Dispense:  30 capsule    Refill:  3

## 2014-05-03 ENCOUNTER — Encounter: Payer: Self-pay | Admitting: Family Medicine

## 2014-05-05 NOTE — Progress Notes (Signed)
Quick Note:  Please advise pt regarding following labs... Kidney function mildly abnormal. Probably related to Polycystic Kidney Disease. Be sure to stay hydrated; drink plenty of water and limit protein in the diet. Avoid excessive doses of ibuprofen, Advil, Aleve and similar medications. Lipid values are above normal. I want you to focus on improving your nutrition. Take a Fish Oil supplement 2 grams a day or Flax Seed Oil supplement 2 grams a day. Take the oil-containing capsule separate from other solid pills and capsule medications.  Get the book "EAT FAT, GET THIN" by Dr. Rosita Kea and try to follow some of his suggestions for healthier eating. Regular exercise will help improve these numbers also.  Lipid panel (fasting) should be checked again in 4-6 months.  Copy to pt. ______

## 2014-05-26 ENCOUNTER — Ambulatory Visit (INDEPENDENT_AMBULATORY_CARE_PROVIDER_SITE_OTHER): Payer: BLUE CROSS/BLUE SHIELD | Admitting: Family Medicine

## 2014-05-26 VITALS — BP 160/90 | HR 87 | Temp 98.6°F | Resp 18 | Ht 65.5 in | Wt 179.2 lb

## 2014-05-26 DIAGNOSIS — K088 Other specified disorders of teeth and supporting structures: Secondary | ICD-10-CM | POA: Diagnosis not present

## 2014-05-26 DIAGNOSIS — K0889 Other specified disorders of teeth and supporting structures: Secondary | ICD-10-CM

## 2014-05-26 MED ORDER — OXYCODONE-ACETAMINOPHEN 5-325 MG PO TABS: 1.0000 | ORAL_TABLET | Freq: Four times a day (QID) | ORAL | Status: DC | PRN

## 2014-05-26 MED ORDER — MELOXICAM 15 MG PO TABS: 15.0000 mg | ORAL_TABLET | Freq: Every day | ORAL | Status: DC

## 2014-05-26 MED ORDER — PENICILLIN V POTASSIUM 500 MG PO TABS: 500.0000 mg | ORAL_TABLET | Freq: Three times a day (TID) | ORAL | Status: DC

## 2014-05-26 NOTE — Progress Notes (Signed)
[facility] [address], [place] [address] [phone]  Date:  05/26/2014   Name:  Kristen Moreno   DOB:  1960-09-18   MRN:  [id number]  PCP:  Ellsworth Lennox, MD    Chief Complaint: Tooth Ache   History of Present Illness:  Kristen Moreno is a 54 y.o. very pleasant female patient who presents with the following:  She is here today with a right lower tooth that is painful- it has bothered her over the last week or so.  It has bothered her in the past but never this badly.   She does not currently have a dentist, but does have dental insurance.   She has tried some BC powders at home for this pain She is tearful from her pain, wants to know if I can pull her tooth   Patient Active Problem List   Diagnosis Date Noted  . GERD (gastroesophageal reflux disease) 05/02/2014  . Tobacco user 05/02/2014  . Incarcerated umbilical hernia 123XX123  . Polycystic kidney disease 12/16/2011  . Chronic renal insufficiency 12/16/2011  . Hypertension 12/16/2011  . S/P hysterectomy 12/16/2011  . Hot flashes 12/16/2011    Past Medical History  Diagnosis Date  . Hypertension   . Ovarian cyst   . Polycystic disease, ovaries   . GERD (gastroesophageal reflux disease)   . Polycystic kidney disease     1998  . Arthritis     LEFT KNEE     Past Surgical History  Procedure Laterality Date  . No past surgeries    . Bladder suspension  08/11/2011    Procedure: TRANSVAGINAL TAPE (TVT) PROCEDURE;  Surgeon: Emily Filbert, MD;  Location: [facility];  Service: Gynecology;  Laterality: N/A;  Add:  Cystoscopy  . Abdominal hysterectomy  2013  . Umbilical hernia repair N/A 07/04/2013    Procedure: HERNIA REPAIR UMBILICAL ;  Surgeon: Harl Bowie, MD;  Location: [facility];  Service: General;  Laterality: N/A;  . Insertion of mesh N/A 07/04/2013    Procedure: INSERTION OF MESH;  Surgeon: Harl Bowie, MD;  Location: [facility];  Service: General;  Laterality: N/A;     History  Substance Use Topics  . Smoking status: Current Every Day Smoker -- 0.50 packs/day for 30 years    Types: Cigarettes  . Smokeless tobacco: Never Used  . Alcohol Use: No     Comment: social    Family History  Problem Relation Age of Onset  . Asthma Mother   . Hypertension Mother   . Asthma Father   . Hypertension Father     Allergies  Allergen Reactions  . Ace Inhibitors Cough    Medication list has been reviewed and updated.  Current Outpatient Prescriptions on File Prior to Visit  Medication Sig Dispense Refill  . amLODipine (NORVASC) 10 MG tablet Take 1 tablet (10 mg total) by mouth at bedtime. 30 tablet 11  . omeprazole (PRILOSEC) 20 MG capsule Take 1 capsule (20 mg total) by mouth daily. 30 capsule 3  . ranitidine (ZANTAC) 150 MG tablet Take 150 mg by mouth 2 (two) times daily.     Current Facility-Administered Medications on File Prior to Visit  Medication Dose Route Frequency Provider Last Rate Last Dose  . triamcinolone acetonide (KENALOG) 10 MG/ML injection 10 mg  10 mg Other Once Trula Slade, DPM        Review of Systems:  As per HPI- otherwise negative. creatinine clearance is over 30, no need  to adjust mobic  Physical Examination: Filed Vitals:   05/26/14 1316  BP: 160/90  Pulse: 87  Temp: 98.6 F (37 C)  Resp: 18   Filed Vitals:   05/26/14 1316  Height: 5' 5.5" (1.664 m)  Weight: 179 lb 3.2 oz (81.285 kg)   Body mass index is 29.36 kg/(m^2). Ideal Body Weight: Weight in (lb) to have BMI = 25: 152.2  GEN: WDWN, NAD, Non-toxic, A & O x 3, tearful due to tooth pain, OW looks well HEENT: Atraumatic, Normocephalic. Neck supple. No masses, No LAD.  Bilateral TM wnl, oropharynx normal.  PEERL,EOMI.   Her teeth show moderate to severe plaque. Numbers 31 and 32 are tender to pressure.  She has caries visible.  No redness or fluctuance Ears and Nose: No external deformity. CV: RRR, No M/G/R. No JVD. No thrill. No extra heart  sounds. PULM: CTA B, no wheezes, crackles, rhonchi. No retractions. No resp. distress. No accessory muscle use. EXTR: No c/c/e NEURO Normal gait.  PSYCH: Normally interactive. Conversant. Not depressed or anxious appearing.  Calm demeanor. \  Assessment and Plan: Toothache - Plan: penicillin v potassium (VEETID) 500 MG tablet, meloxicam (MOBIC) 15 MG tablet, oxyCODONE-acetaminophen (ROXICET) 5-325 MG per tablet  Severe tooth pain.  Advised her that I am not able to pull her tooth, but I hope she feels better with medication.  Encouraged her to find a dentist asap.  Unfortunately most are closed today (friday afternoon) Penicillin, mobic (start tomorrow as she has taken BC powders), percocet.    Signed Lamar Blinks, MD

## 2014-05-26 NOTE — Patient Instructions (Signed)
I am sorry that you have a toothache!  Use the mobic as needed for pain, and the percocet for more severe pain Also use the penicillin antibiotic three times a day. Please look up some dental office numbers over the weekend and start calling them Monday am to get an appointment asap If you need any help let us know!

## 2014-06-29 ENCOUNTER — Telehealth: Payer: Self-pay | Admitting: *Deleted

## 2014-06-29 NOTE — Telephone Encounter (Signed)
Pt states her heel pain has come back with a vengeance and she can hardly walk, and she's wearing her brace, but it's not helping.  I encouraged pt to make an appt, ice the area 3 - 4 times daily for 10 - 15 minutes each time, and take OTC Ibuprofen as the package directs if she is able to tolerate it.  Pt agreed and is transferred to schedulers.

## 2014-07-07 ENCOUNTER — Ambulatory Visit (INDEPENDENT_AMBULATORY_CARE_PROVIDER_SITE_OTHER): Payer: BLUE CROSS/BLUE SHIELD | Admitting: Neurology

## 2014-07-07 ENCOUNTER — Encounter: Payer: Self-pay | Admitting: Neurology

## 2014-07-07 VITALS — BP 131/80 | HR 87 | Ht 66.0 in | Wt 175.0 lb

## 2014-07-07 DIAGNOSIS — R519 Headache, unspecified: Secondary | ICD-10-CM

## 2014-07-07 DIAGNOSIS — G4719 Other hypersomnia: Secondary | ICD-10-CM | POA: Diagnosis not present

## 2014-07-07 DIAGNOSIS — G2581 Restless legs syndrome: Secondary | ICD-10-CM

## 2014-07-07 DIAGNOSIS — R351 Nocturia: Secondary | ICD-10-CM

## 2014-07-07 DIAGNOSIS — R0683 Snoring: Secondary | ICD-10-CM | POA: Diagnosis not present

## 2014-07-07 DIAGNOSIS — E663 Overweight: Secondary | ICD-10-CM | POA: Diagnosis not present

## 2014-07-07 DIAGNOSIS — R51 Headache: Secondary | ICD-10-CM

## 2014-07-07 NOTE — Patient Instructions (Signed)
Based on your symptoms and your exam I believe you are at risk for obstructive sleep apnea or OSA, and I think we should proceed with a sleep study to determine whether you do or do not have OSA and how severe it is. If you have more than mild OSA, I want you to consider treatment with CPAP. Please remember, the risks and ramifications of moderate to severe obstructive sleep apnea or OSA are: Cardiovascular disease, including congestive heart failure, stroke, difficult to control hypertension, arrhythmias, and even type 2 diabetes has been linked to untreated OSA. Sleep apnea causes disruption of sleep and sleep deprivation in most cases, which, in turn, can cause recurrent headaches, problems with memory, mood, concentration, focus, and vigilance. Most people with untreated sleep apnea report excessive daytime sleepiness, which can affect their ability to drive. Please do not drive if you feel sleepy.  I will see you back after your sleep study to go over the test results and where to go from there. We will call you after your sleep study and to set up an appointment at the time.   Our sleep lab administrative assistant, Angelina Sheriff will call you to schedule your sleep study. If you don't hear back from her by next week please feel free to call her at [phone]. This is her direct line and please leave a message with your phone number to call back if you get the voicemail box.

## 2014-07-07 NOTE — Progress Notes (Signed)
Subjective:    Patient ID: Kristen Moreno is a 54 y.o. female.  HPI     Star Age, MD, PhD [facility] [address], [address] [place], [place] [address]  Dear Dr. Leward Quan,  I saw your patient, Kristen Moreno, upon your kind request in my neurologic clinic today for initial consultation of her sleep disorder, in particular, concern for underlying obstructive sleep apnea. The patient is unaccompanied today. As you know, [name] is a 54 year old right-handed woman with an underlying medical history of reflux disease, smoking, polycystic kidney disease, chronic renal insufficiency, hypertension, and overweight state, who reports snoring and excessive daytime somnolence, as well as frequent morning headaches and significant nocturia. Her usual bedtime is around 9 or 9:30 p.m. She falls asleep fairly quickly but does not stay asleep well. She wakes up 5 or 6 times per night for no apparent reason. Sometimes she wakes up from her own snoring. She lives with a roommate. Rise time is 6 AM. She does not typically wake up rested.she works as a Banker. She reports occasional nighttime leg aches and the need to stretch or move her legs at night. She wakes up often with a headache. She has a morning headache about 3-4 times out of the week. Sometimes she attributes this to not always taking her blood pressure medication. She has done better in that regard. She's trying to lose weight. Her weight has been right around 170 pounds for the past couple of years. She then gained weight up to 179 pounds and is currently 175 pounds. She does not drink caffeine every day. She drinks alcohol very occasionally, she is trying to quit smoking and currently smokes less than half a pack per day. She has significant nocturia, about 4 times each night. She has never fallen asleep at the wheel. She does feel tired and sleepy during the day. Her Epworth sleepiness score is 17  out of 24 today, her fatigue score is 44 out of 63. Her Past Medical History Is Significant For: Past Medical History  Diagnosis Date  . Hypertension   . Ovarian cyst   . Polycystic disease, ovaries   . GERD (gastroesophageal reflux disease)   . Polycystic kidney disease     1998  . Arthritis     LEFT KNEE     Her Past Surgical History Is Significant For: Past Surgical History  Procedure Laterality Date  . No past surgeries    . Bladder suspension  08/11/2011    Procedure: TRANSVAGINAL TAPE (TVT) PROCEDURE;  Surgeon: Emily Filbert, MD;  Location: [facility];  Service: Gynecology;  Laterality: N/A;  Add:  Cystoscopy  . Abdominal hysterectomy  2013  . Umbilical hernia repair N/A 07/04/2013    Procedure: HERNIA REPAIR UMBILICAL ;  Surgeon: Harl Bowie, MD;  Location: [facility];  Service: General;  Laterality: N/A;  . Insertion of mesh N/A 07/04/2013    Procedure: INSERTION OF MESH;  Surgeon: Harl Bowie, MD;  Location: [facility];  Service: General;  Laterality: N/A;    Her Family History Is Significant For: Family History  Problem Relation Age of Onset  . Asthma Mother   . Hypertension Mother   . Asthma Father   . Hypertension Father     Her Social History Is Significant For: History   Social History  . Marital Status: Legally Separated    Spouse Name: N/A  . Number of Children: 2  . Years of Education: N/A  Occupational History  . supervisor    Social History Main Topics  . Smoking status: Current Every Day Smoker -- 0.50 packs/day for 30 years    Types: Cigarettes  . Smokeless tobacco: Never Used  . Alcohol Use: No     Comment: social  . Drug Use: No  . Sexual Activity: Yes   Other Topics Concern  . None   Social History Narrative   Patient lives at home with her room mate.   Patient works full time.   Education some college.   Right handed.   Caffeine one soda per week.    Her Allergies Are:  Allergies  Allergen Reactions  . Ace Inhibitors Cough   :   Her Current Medications Are:  Outpatient Encounter Prescriptions as of 07/07/2014  Medication Sig  . amLODipine (NORVASC) 10 MG tablet Take 1 tablet (10 mg total) by mouth at bedtime.  . [DISCONTINUED] meloxicam (MOBIC) 15 MG tablet Take 1 tablet (15 mg total) by mouth daily. Use as needed for pain (Patient not taking: Reported on 07/07/2014)  . [DISCONTINUED] omeprazole (PRILOSEC) 20 MG capsule Take 1 capsule (20 mg total) by mouth daily. (Patient not taking: Reported on 07/07/2014)  . [DISCONTINUED] oxyCODONE-acetaminophen (ROXICET) 5-325 MG per tablet Take 1 tablet by mouth every 6 (six) hours as needed for severe pain. (Patient not taking: Reported on 07/07/2014)  . [DISCONTINUED] penicillin v potassium (VEETID) 500 MG tablet Take 1 tablet (500 mg total) by mouth 3 (three) times daily. (Patient not taking: Reported on 07/07/2014)  . [DISCONTINUED] ranitidine (ZANTAC) 150 MG tablet Take 150 mg by mouth 2 (two) times daily.   Facility-Administered Encounter Medications as of 07/07/2014  Medication  . triamcinolone acetonide (KENALOG) 10 MG/ML injection 10 mg  :  Review of Systems:  Out of a complete 14 point review of systems, all are reviewed and negative with the exception of these symptoms as listed below:   Review of Systems  Constitutional: Positive for fatigue.  HENT: Positive for ear pain.        Right ear.  Eyes: Positive for visual disturbance.  Neurological: Positive for light-headedness.  Psychiatric/Behavioral: Positive for sleep disturbance.       Snoring     Objective:  Neurologic Exam  Physical Exam Physical Examination:   Filed Vitals:   07/07/14 0839  BP: 131/80  Pulse: 87    General Examination: The patient is a very pleasant 54 y.o. female in no acute distress. She appears well-developed and well-nourished and well groomed.   HEENT: Normocephalic, atraumatic, pupils are equal, round and reactive to light and accommodation. Funduscopic exam is normal  with sharp disc margins noted. Extraocular tracking is good without limitation to gaze excursion or nystagmus noted. Normal smooth pursuit is noted. Hearing is grossly intact. Tympanic membranes are clear bilaterally. Face is symmetric with normal facial animation and normal facial sensation. Speech is clear with no dysarthria noted. There is no hypophonia. There is no lip, neck/head, jaw or voice tremor. Neck is supple with full range of passive and active motion. There are no carotid bruits on auscultation. Oropharynx exam reveals: mild mouth dryness, adequate dental hygiene and moderate airway crowding, due to narrow airway entry, thicker soft palate and large uvula, elongated tongue and tonsils in place. Mallampati is class II. Tonsils are about 1-2+ bilaterally. Neck size is 13-7/8 inches.  Chest: Clear to auscultation without wheezing, rhonchi or crackles noted.  Heart: S1+S2+0, regular and normal without murmurs, rubs or gallops noted.  Abdomen: Soft, non-tender and non-distended with normal bowel sounds appreciated on auscultation.  Extremities: There is no pitting edema in the distal lower extremities bilaterally. Pedal pulses are intact.  Skin: Warm and dry without trophic changes noted. There are no varicose veins.  Musculoskeletal: exam reveals no obvious joint deformities, tenderness or joint swelling or erythema.   Neurologically:  Mental status: The patient is awake, alert and oriented in all 4 spheres. Her immediate and remote memory, attention, language skills and fund of knowledge are appropriate. There is no evidence of aphasia, agnosia, apraxia or anomia. Speech is clear with normal prosody and enunciation. Thought process is linear. Mood is normal and affect is normal.  Cranial nerves II - XII are as described above under HEENT exam. In addition: shoulder shrug is normal with equal shoulder height noted. Motor exam: Normal bulk, strength and tone is noted. There is no drift,  tremor or rebound. Romberg is negative. Reflexes are 2+ throughout. Babinski: Toes are flexor bilaterally. Fine motor skills and coordination: intact with normal finger taps, normal hand movements, normal rapid alternating patting, normal foot taps and normal foot agility.  Cerebellar testing: No dysmetria or intention tremor on finger to nose testing. Heel to shin is unremarkable bilaterally. There is no truncal or gait ataxia.  Sensory exam: intact to light touch, pinprick, vibration, temperature sense in the upper and lower extremities.  Gait, station and balance: She stands easily. No veering to one side is noted. No leaning to one side is noted. Posture is age-appropriate and stance is narrow based. Gait shows normal stride length and normal pace. No problems turning are noted. She turns en bloc. Tandem walk is unremarkable.              Assessment and Plan:   In summary, Kalii Henningsen is a very pleasant 54 y.o.-year old female with an underlying medical history of reflux disease, smoking, polycystic kidney disease, chronic renal insufficiency, hypertension, and overweight state, who reports snoring and excessive daytime somnolence, as well as frequent morning headaches and significant nocturia. Her history and physical exam are concerning for obstructive sleep apnea (OSA). In addition, she endorses mild restless leg symptoms. I had a long chat with the patient about my findings and the diagnosis of OSA, its prognosis and treatment options. We talked about medical treatments, surgical interventions and non-pharmacological approaches. I explained in particular the risks and ramifications of untreated moderate to severe OSA, especially with respect to developing cardiovascular disease down the Road, including congestive heart failure, difficult to treat hypertension, cardiac arrhythmias, or stroke. Even type 2 diabetes has, in part, been linked to untreated OSA. Symptoms of untreated OSA include  daytime sleepiness, memory problems, mood irritability and mood disorder such as depression and anxiety, lack of energy, as well as recurrent headaches, especially morning headaches. We talked about smoking cessation and trying to maintain a healthy lifestyle in general, as well as the importance of weight control. I encouraged the patient to eat healthy, exercise daily and keep well hydrated, to keep a scheduled bedtime and wake time routine, to not skip any meals and eat healthy snacks in between meals. I advised the patient not to drive when feeling sleepy. I recommended the following at this time: sleep study with potential positive airway pressure titration. (We will score hypopneas at 3% and split the sleep study into diagnostic and treatment portion, if the estimated. 2 hour AHI is >15/h).   I explained the sleep test procedure to the patient and  also outlined possible surgical and non-surgical treatment options of OSA, including the use of a custom-made dental device (which would require a referral to a specialist dentist or oral surgeon), upper airway surgical options, such as pillar implants, radiofrequency surgery, tongue base surgery, and UPPP (which would involve a referral to an ENT surgeon). Rarely, jaw surgery such as mandibular advancement may be considered.  I also explained the CPAP treatment option to the patient, who indicated that she would be willing to try CPAP if the need arises. I explained the importance of being compliant with PAP treatment, not only for insurance purposes but primarily to improve Her symptoms, and for the patient's long term health benefit, including to reduce Her cardiovascular risks. I answered all her questions today and the patient was in agreement. I would like to see her back after the sleep study is completed and encouraged her to call with any interim questions, concerns, problems or updates.   Thank you very much for allowing me to participate in the care  of this nice patient. If I can be of any further assistance to you please do not hesitate to call me at [phone].  Sincerely,   Star Age, MD, PhD

## 2014-07-21 ENCOUNTER — Ambulatory Visit (INDEPENDENT_AMBULATORY_CARE_PROVIDER_SITE_OTHER): Payer: BLUE CROSS/BLUE SHIELD | Admitting: Podiatry

## 2014-07-21 ENCOUNTER — Encounter: Payer: Self-pay | Admitting: Podiatry

## 2014-07-21 VITALS — BP 150/87 | HR 76 | Resp 14

## 2014-07-21 DIAGNOSIS — M722 Plantar fascial fibromatosis: Secondary | ICD-10-CM | POA: Diagnosis not present

## 2014-07-21 DIAGNOSIS — M79671 Pain in right foot: Secondary | ICD-10-CM

## 2014-07-21 MED ORDER — TRIAMCINOLONE ACETONIDE 10 MG/ML IJ SUSP
10.0000 mg | Freq: Once | INTRAMUSCULAR | Status: AC
  Administered 2014-07-21: 10 mg

## 2014-07-21 NOTE — Patient Instructions (Signed)
Plantar Fasciitis (Heel Spur Syndrome) with Rehab The plantar fascia is a fibrous, ligament-like, soft-tissue structure that spans the bottom of the foot. Plantar fasciitis is a condition that causes pain in the foot due to inflammation of the tissue. SYMPTOMS   Pain and tenderness on the underneath side of the foot.  Pain that worsens with standing or walking. CAUSES  Plantar fasciitis is caused by irritation and injury to the plantar fascia on the underneath side of the foot. Common mechanisms of injury include:  Direct trauma to bottom of the foot.  Damage to a small nerve that runs under the foot where the main fascia attaches to the heel bone.  Stress placed on the plantar fascia due to bone spurs. RISK INCREASES WITH:   Activities that place stress on the plantar fascia (running, jumping, pivoting, or cutting).  Poor strength and flexibility.  Improperly fitted shoes.  Tight calf muscles.  Flat feet.  Failure to warm-up properly before activity.  Obesity. PREVENTION  Warm up and stretch properly before activity.  Allow for adequate recovery between workouts.  Maintain physical fitness:  Strength, flexibility, and endurance.  Cardiovascular fitness.  Maintain a health body weight.  Avoid stress on the plantar fascia.  Wear properly fitted shoes, including arch supports for individuals who have flat feet. PROGNOSIS  If treated properly, then the symptoms of plantar fasciitis usually resolve without surgery. However, occasionally surgery is necessary. RELATED COMPLICATIONS   Recurrent symptoms that may result in a chronic condition.  Problems of the lower back that are caused by compensating for the injury, such as limping.  Pain or weakness of the foot during push-off following surgery.  Chronic inflammation, scarring, and partial or complete fascia tear, occurring more often from repeated injections. TREATMENT  Treatment initially involves the use of  ice and medication to help reduce pain and inflammation. The use of strengthening and stretching exercises may help reduce pain with activity, especially stretches of the Achilles tendon. These exercises may be performed at home or with a therapist. Your caregiver may recommend that you use heel cups of arch supports to help reduce stress on the plantar fascia. Occasionally, corticosteroid injections are given to reduce inflammation. If symptoms persist for greater than 6 months despite non-surgical (conservative), then surgery may be recommended.  MEDICATION   If pain medication is necessary, then nonsteroidal anti-inflammatory medications, such as aspirin and ibuprofen, or other minor pain relievers, such as acetaminophen, are often recommended.  Do not take pain medication within 7 days before surgery.  Prescription pain relievers may be given if deemed necessary by your caregiver. Use only as directed and only as much as you need.  Corticosteroid injections may be given by your caregiver. These injections should be reserved for the most serious cases, because they may only be given a certain number of times. HEAT AND COLD  Cold treatment (icing) relieves pain and reduces inflammation. Cold treatment should be applied for 10 to 15 minutes every 2 to 3 hours for inflammation and pain and immediately after any activity that aggravates your symptoms. Use ice packs or massage the area with a piece of ice (ice massage).  Heat treatment may be used prior to performing the stretching and strengthening activities prescribed by your caregiver, physical therapist, or athletic trainer. Use a heat pack or soak the injury in warm water. SEEK IMMEDIATE MEDICAL CARE IF:  Treatment seems to offer no benefit, or the condition worsens.  Any medications produce adverse side effects. EXERCISES RANGE   OF MOTION (ROM) AND STRETCHING EXERCISES - Plantar Fasciitis (Heel Spur Syndrome) These exercises may help you  when beginning to rehabilitate your injury. Your symptoms may resolve with or without further involvement from your physician, physical therapist or athletic trainer. While completing these exercises, remember:   Restoring tissue flexibility helps normal motion to return to the joints. This allows healthier, less painful movement and activity.  An effective stretch should be held for at least 30 seconds.  A stretch should never be painful. You should only feel a gentle lengthening or release in the stretched tissue. RANGE OF MOTION - Toe Extension, Flexion  Sit with your right / left leg crossed over your opposite knee.  Grasp your toes and gently pull them back toward the top of your foot. You should feel a stretch on the bottom of your toes and/or foot.  Hold this stretch for __________ seconds.  Now, gently pull your toes toward the bottom of your foot. You should feel a stretch on the top of your toes and or foot.  Hold this stretch for __________ seconds. Repeat __________ times. Complete this stretch __________ times per day.  RANGE OF MOTION - Ankle Dorsiflexion, Active Assisted  Remove shoes and sit on a chair that is preferably not on a carpeted surface.  Place right / left foot under knee. Extend your opposite leg for support.  Keeping your heel down, slide your right / left foot back toward the chair until you feel a stretch at your ankle or calf. If you do not feel a stretch, slide your bottom forward to the edge of the chair, while still keeping your heel down.  Hold this stretch for __________ seconds. Repeat __________ times. Complete this stretch __________ times per day.  STRETCH - Gastroc, Standing  Place hands on wall.  Extend right / left leg, keeping the front knee somewhat bent.  Slightly point your toes inward on your back foot.  Keeping your right / left heel on the floor and your knee straight, shift your weight toward the wall, not allowing your back to  arch.  You should feel a gentle stretch in the right / left calf. Hold this position for __________ seconds. Repeat __________ times. Complete this stretch __________ times per day. STRETCH - Soleus, Standing  Place hands on wall.  Extend right / left leg, keeping the other knee somewhat bent.  Slightly point your toes inward on your back foot.  Keep your right / left heel on the floor, bend your back knee, and slightly shift your weight over the back leg so that you feel a gentle stretch deep in your back calf.  Hold this position for __________ seconds. Repeat __________ times. Complete this stretch __________ times per day. STRETCH - Gastrocsoleus, Standing  Note: This exercise can place a lot of stress on your foot and ankle. Please complete this exercise only if specifically instructed by your caregiver.   Place the ball of your right / left foot on a step, keeping your other foot firmly on the same step.  Hold on to the wall or a rail for balance.  Slowly lift your other foot, allowing your body weight to press your heel down over the edge of the step.  You should feel a stretch in your right / left calf.  Hold this position for __________ seconds.  Repeat this exercise with a slight bend in your right / left knee. Repeat __________ times. Complete this stretch __________ times per day.    STRENGTHENING EXERCISES - Plantar Fasciitis (Heel Spur Syndrome)  These exercises may help you when beginning to rehabilitate your injury. They may resolve your symptoms with or without further involvement from your physician, physical therapist or athletic trainer. While completing these exercises, remember:   Muscles can gain both the endurance and the strength needed for everyday activities through controlled exercises.  Complete these exercises as instructed by your physician, physical therapist or athletic trainer. Progress the resistance and repetitions only as guided. STRENGTH -  Towel Curls  Sit in a chair positioned on a non-carpeted surface.  Place your foot on a towel, keeping your heel on the floor.  Pull the towel toward your heel by only curling your toes. Keep your heel on the floor.  If instructed by your physician, physical therapist or athletic trainer, add ____________________ at the end of the towel. Repeat __________ times. Complete this exercise __________ times per day. STRENGTH - Ankle Inversion  Secure one end of a rubber exercise band/tubing to a fixed object (table, pole). Loop the other end around your foot just before your toes.  Place your fists between your knees. This will focus your strengthening at your ankle.  Slowly, pull your big toe up and in, making sure the band/tubing is positioned to resist the entire motion.  Hold this position for __________ seconds.  Have your muscles resist the band/tubing as it slowly pulls your foot back to the starting position. Repeat __________ times. Complete this exercises __________ times per day.  Document Released: 01/06/2005 Document Revised: 03/31/2011 Document Reviewed: 04/20/2008 [employer] Patient Information 2015 [employer]. This information is not intended to replace advice given to you by your health care provider. Make sure you discuss any questions you have with your health care provider.  

## 2014-07-27 ENCOUNTER — Ambulatory Visit (INDEPENDENT_AMBULATORY_CARE_PROVIDER_SITE_OTHER): Payer: BLUE CROSS/BLUE SHIELD | Admitting: Neurology

## 2014-07-27 VITALS — BP 141/96 | HR 85

## 2014-07-27 DIAGNOSIS — G4730 Sleep apnea, unspecified: Secondary | ICD-10-CM

## 2014-07-27 DIAGNOSIS — G4710 Hypersomnia, unspecified: Secondary | ICD-10-CM

## 2014-07-27 DIAGNOSIS — G4733 Obstructive sleep apnea (adult) (pediatric): Secondary | ICD-10-CM | POA: Diagnosis not present

## 2014-07-27 DIAGNOSIS — G4734 Idiopathic sleep related nonobstructive alveolar hypoventilation: Secondary | ICD-10-CM

## 2014-07-28 NOTE — Sleep Study (Signed)
Please see the scanned sleep study interpretation located in the Procedure tab within the Chart Review section. 

## 2014-07-30 NOTE — Progress Notes (Signed)
Patient ID: Kristen Moreno, female   DOB: 15-Dec-1960, 54 y.o.   MRN: [id number]  Subjective: 54 year old female presents the office today for recurrence of right heel pain which has been ongoing for approximately one month. She states after her last appointment she was having no symptoms for probably 3 months for this and surgery occur about a month ago. She states that she continues have pain in the bottom of her right heel particularly after standing all day and in the mornings. She states the pain as become more throbbing pain and she has difficulty standing all day at work on concrete floors. Denies any numbness or tingling. No recent injury or trauma. No swelling or redness. Pain does not wake her up at night. No other complaints at this time in no acute changes since last appointment.  Objective: AAO x3, NAD DP/PT pulses palpable bilaterally, CRT less than 3 seconds Protective sensation intact with Simms Weinstein monofilament, vibratory sensation intact, Achilles tendon reflex intact; negative tinel sign There is tenderness to palpation overlying the plantar medial tubercle of the calcaneus to right heel at the insertion of the plantar fascia. There is no pain along the course of plantar fascia within the arch of the foot. The plantar fascia appears intact. There is no pain with lateral compression of the calcaneus or pain the vibratory sensation. No pain on the posterior aspect of the calcaneus or along the course/insertion of the Achilles tendon. There is no overlying edema, erythema, increase in warmth. No other areas of tenderness palpation or pain with vibratory sensation to the foot/ankle. MMT 5/5, ROM WNL No open lesions or pre-ulcerative lesions are identified. No pain with calf compression, swelling, warmth, erythema.  Assessment:  54 year old female with reoccurrence of right heel pain, plantar fasciitis  Plan: -Treatment options discussed including all alternatives, risks, and  complications -Patient elects to proceed with steroid injection into the right heel. Under sterile skin preparation, a total of 2.5cc of kenalog 10, 0.5% Marcaine plain, and 2% lidocaine plain were infiltrated into the symptomatic area without complication. A band-aid was applied. Patient tolerated the injection well without complication. Post-injection care with discussed with the patient. Discussed with the patient to ice the area over the next couple of days to help prevent a steroid flare.  -Discussed the third injection to the area. If she continues to have symptoms appointment and to discuss further surgical intervention and she would like to pursue this at that time if symptoms continue. I discussed with her open plantar fasciectomy with calcaneal spur excision, PRP. -Continue stretching icing activities and a consistent basis. Ice to the area. Discussion modifications. Continue with orthotics. -Follow-up 4 weeks or sooner if any problems arise. In the meantime, encouraged to call the office with any questions, concerns, change in symptoms.   Celesta Gentile, DPM

## 2014-08-09 ENCOUNTER — Telehealth: Payer: Self-pay | Admitting: Neurology

## 2014-08-09 DIAGNOSIS — G4733 Obstructive sleep apnea (adult) (pediatric): Secondary | ICD-10-CM

## 2014-08-09 DIAGNOSIS — G4734 Idiopathic sleep related nonobstructive alveolar hypoventilation: Secondary | ICD-10-CM

## 2014-08-09 NOTE — Telephone Encounter (Signed)
Left message to call back  

## 2014-08-09 NOTE — Telephone Encounter (Signed)
Patient seen on 07/07/14, PSG on 07/27/14:   Please call and notify the patient that the recent sleep study did confirm the diagnosis of obstructive sleep apnea and that I recommend treatment for this in the form of CPAP, due to significant sleep related complaints and her oxygen saturations were suboptimal. This will require a repeat sleep study for proper titration and mask fitting. Please explain to patient and arrange for a CPAP titration study. I have placed an order in the chart. Thanks,  Star Age, MD, PhD [facility])

## 2014-08-09 NOTE — Telephone Encounter (Signed)
I spoke to [place] and she is aware of results. She states that she can probably do the second sleep study tonight if there is an opening. She is aware that [facility] will call with appt. She states that she is at work and just to leave a message if she is unavailable.

## 2014-08-11 ENCOUNTER — Ambulatory Visit (INDEPENDENT_AMBULATORY_CARE_PROVIDER_SITE_OTHER): Payer: BLUE CROSS/BLUE SHIELD | Admitting: Podiatry

## 2014-08-11 ENCOUNTER — Encounter: Payer: Self-pay | Admitting: Podiatry

## 2014-08-11 VITALS — BP 151/85 | HR 89 | Resp 18

## 2014-08-11 DIAGNOSIS — M722 Plantar fascial fibromatosis: Secondary | ICD-10-CM

## 2014-08-11 NOTE — Patient Instructions (Signed)
Pre-Operative Instructions  Congratulations, you have decided to take an important step to improving your quality of life.  You can be assured that the doctors of [facility] will be with you every step of the way.  1. Plan to be at the surgery center/hospital at least 1 (one) hour prior to your scheduled time unless otherwise directed by the surgical center/hospital staff.  You must have a responsible adult accompany you, remain during the surgery and drive you home.  Make sure you have directions to the surgical center/hospital and know how to get there on time. 2. For hospital based surgery you will need to obtain a history and physical form from your family physician within 1 month prior to the date of surgery- we will give you a form for you primary physician.  3. We make every effort to accommodate the date you request for surgery.  There are however, times where surgery dates or times have to be moved.  We will contact you as soon as possible if a change in schedule is required.   4. No Aspirin/Ibuprofen for one week before surgery.  If you are on aspirin, any non-steroidal anti-inflammatory medications (Mobic, Aleve, Ibuprofen) you should stop taking it 7 days prior to your surgery.  You make take Tylenol  For pain prior to surgery.  5. Medications- If you are taking daily heart and blood pressure medications, seizure, reflux, allergy, asthma, anxiety, pain or diabetes medications, make sure the surgery center/hospital is aware before the day of surgery so they may notify you which medications to take or avoid the day of surgery. 6. No food or drink after midnight the night before surgery unless directed otherwise by surgical center/hospital staff. 7. No alcoholic beverages 24 hours prior to surgery.  No smoking 24 hours prior to or 24 hours after surgery. 8. Wear loose pants or shorts- loose enough to fit over bandages, boots, and casts. 9. No slip on shoes, sneakers are best. 10. Bring  your boot with you to the surgery center/hospital.  Also bring crutches or a walker if your physician has prescribed it for you.  If you do not have this equipment, it will be provided for you after surgery. 11. If you have not been contracted by the surgery center/hospital by the day before your surgery, call to confirm the date and time of your surgery. 12. Leave-time from work may vary depending on the type of surgery you have.  Appropriate arrangements should be made prior to surgery with your employer. 13. Prescriptions will be provided immediately following surgery by your doctor.  Have these filled as soon as possible after surgery and take the medication as directed. 14. Remove nail polish on the operative foot. 15. Wash the night before surgery.  The night before surgery wash the foot and leg well with the antibacterial soap provided and water paying special attention to beneath the toenails and in between the toes.  Rinse thoroughly with water and dry well with a towel.  Perform this wash unless told not to do so by your physician.  Enclosed: 1 Ice pack (please put in freezer the night before surgery)   1 Hibiclens skin cleaner   Pre-op Instructions  If you have any questions regarding the instructions, do not hesitate to call our office.  [place]: [address] [place], [place] [address] [phone]  [place]: [address] [place], [place] [address] [phone]  [place]: [address]  [place], [place] [address] [phone]  Dr. Richard   Tuchman DPM, Dr. Norman Regal DPM Dr. Richard Sikora DPM, Dr. M. Todd Hyatt DPM, Dr. Kathryn Egerton DPM 

## 2014-08-11 NOTE — Progress Notes (Signed)
Patient ID: Kristen Moreno, female   DOB: [birth year], 54 y.o.   MRN: [id number]  Subjective: 54 year old female presents the office today for continuation of heel pain. She states the injection does help although she does continue to have pain to the heel particularly with standing or in the mornings. She has been stretching, icing and wearing the brace. She also previously had been immobilized in a Cam Walker. She is also previous he had physical therapy. Despite conservative treatment she is a question surgical intervention due to continued pain. She denies any numbness or tingling. Denies any swelling or redness. No other complaints at this time. The pain does not wake her up at night.  Objective: AAO x3, NAD DP/PT pulses palpable bilaterally, CRT less than 3 seconds Protective sensation intact with Simms Weinstein monofilament, vibratory sensation intact, Achilles tendon reflex intact; negative tinel sign There is continued tenderness to palpation overlying the plantar medial tubercle of the calcaneus to right heel at the insertion of the plantar fascia. There is no pain along the course of plantar fascia within the arch of the foot. The plantar fascia appears intact. There is no pain with lateral compression of the calcaneus or pain the vibratory sensation. No pain on the posterior aspect of the calcaneus or along the course/insertion of the Achilles tendon. There is no overlying edema, erythema, increase in warmth. No other areas of tenderness palpation or pain with vibratory sensation to the foot/ankle. MMT 5/5, ROM WNL No open lesions or pre-ulcerative lesions are identified. No pain with calf compression, swelling, warmth, erythema.  Assessment:  54 year old female with ongoing right heel pain, plantar fasciitis  Plan: -Treatment options discussed including all alternatives, risks, and complications -I discussed both conservative and surgical treatment options the patient. -At this time  she is requesting surgical intervention to help decrease her pain. I discussed with her right foot open versus endoscopic plantar fascial release, heel spur resection, PRP. The incision placement as well as the postoperative course was discussed with the patient. I discussed risks of the surgery which include, but not limited to, infection, bleeding, pain, swelling, need for further surgery, delayed or nonhealing, painful or ugly scar, numbness or sensation changes, over/under correction, recurrence, transfer lesions, further deformity, hardware failure, DVT/PE, loss of toe/foot. Patient understands these risks and wishes to proceed with surgery. The surgical consent was reviewed with the patient all 3 pages were signed. No promises or guarantees were given to the outcome of the procedure. All questions were answered to the best of my ability. Before the surgery the patient was encouraged to call the office if there is any further questions. The surgery will be performed at the [facility] on an outpatient basis.  Celesta Gentile, DPM

## 2014-08-15 ENCOUNTER — Telehealth: Payer: Self-pay | Admitting: *Deleted

## 2014-08-15 NOTE — Telephone Encounter (Signed)
"  I'm calling to verify patient is scheduled for surgery on 08/23/2014."  Yes, she is.  "When was patient first seen?  When was her last visit?  What's the procedure and diagnosis codes?  What hospital will surgery be performed?  How long will patient be out of work?  Is patient's condition work related?"  Patient was first seen on 11/11/2013.  She was last seen 08/11/2014.  "Was x-rays taken on the initial visit?  If so, did it show the diagnosis?"  Yes, x-rays were taken and did suggest the patient's condition.  The procedures are Heel Spur Resection and Endoscopic Plantar Fasciotomy.  Diagnosis codes are M72.2 which is Plantar Fasciitis and M77.30 which is Heel Spur.  The surgery will be performed at [facility], which is an outpatient ambulatory facility.  Patient may be out of work 4-6 weeks.  "If accommodations are made, will patient be able to return sooner?"  No, she will not be able to return to work sooner.  I do not see in her notes if her condition is job related.  "Is patient taking any medications at this time for the condition?" No, she is not taking any medications for this at this time.  "You may receive some paperwork from [place], please just disregard at this time and save for future use."

## 2014-08-23 ENCOUNTER — Encounter: Payer: Self-pay | Admitting: Podiatry

## 2014-08-23 DIAGNOSIS — M722 Plantar fascial fibromatosis: Secondary | ICD-10-CM | POA: Diagnosis not present

## 2014-08-23 DIAGNOSIS — M7731 Calcaneal spur, right foot: Secondary | ICD-10-CM | POA: Diagnosis not present

## 2014-08-28 ENCOUNTER — Ambulatory Visit (INDEPENDENT_AMBULATORY_CARE_PROVIDER_SITE_OTHER): Payer: BLUE CROSS/BLUE SHIELD

## 2014-08-28 ENCOUNTER — Encounter: Payer: Self-pay | Admitting: Podiatry

## 2014-08-28 ENCOUNTER — Ambulatory Visit (INDEPENDENT_AMBULATORY_CARE_PROVIDER_SITE_OTHER): Payer: BLUE CROSS/BLUE SHIELD | Admitting: Podiatry

## 2014-08-28 VITALS — BP 149/89 | HR 85 | Resp 18

## 2014-08-28 DIAGNOSIS — M722 Plantar fascial fibromatosis: Secondary | ICD-10-CM

## 2014-08-28 DIAGNOSIS — Z9889 Other specified postprocedural states: Secondary | ICD-10-CM

## 2014-08-28 NOTE — Progress Notes (Signed)
Patient ID: Kristen Moreno, female   DOB: 1960-02-28, 54 y.o.   MRN: [id number]  DOS: 08/23/14 Right plantar fasciotomy, partial heel spur resection, PRP  Subjective: 54 year old female presents the office today one-week status post right plantar fascial release. She states that overall she is doing well. She does get some intermittent discomfort however control the pain medication. She has not required pain medicine consistent basis. She did say that she get the bandage wet a couple days ago. She has continued with a Cam Walker during the day however she does not wear it at night. She is taking antibiotic as directed. She denies any systemic complaints of fevers, chills, nausea, vomiting. Denies any calf pain, chest pain, soreness of breath. No other complaints at this time in no acute changes since last appointment.  Objective: AAO 3, NAD DP/PT pulses palpable, CRT less than 3 seconds Protective sensation intact with Simms Weinstein monofilament. There is no subjective numbness or tingling to her feet. Incisions on both the medial and lateral aspect of the heel well coapted without any evidence of dehiscence and sutures are intact. There is no surrounding erythema, ascending cellulitis, fluctuance, crepitus, malodor, drainage/purulence. There is mild edema over the surgical site. There is mild discomfort along the surgical site. No other areas of tenderness to bilateral lower extremity is. No open lesions or pre-ulcerative lesions. There is no pain with calf compression, swelling, warmth, erythema.  Assessment: 54 year old female status post right foot surgery  Plan: -Treatment options discussed including all alternatives, risks, and complications -X-rays were obtained and reviewed with the patient. Heel spur still evident. I discussed with patient during surgery remove the portion that was palpable. She is aware of these findings. -Antibiotic ointment was placed over the incisions followed by  dry sterile dressing. She the dressing clean, dry, intact. -Continue use Cam Walker at all times. Discussed that her wear the boot even at night. She does not want with a boot at night she is to wear the night splint. -Ice and elevation -Pain medication as needed -Monitor for any clinical signs or symptoms of infection and directed to call the office immediately should any occur or go to the ER. -Follow-up 1 week for suture removal or sooner if any problems arise. In the meantime, encouraged to call the office with any questions, concerns, change in symptoms.   Celesta Gentile, DPM

## 2014-08-28 NOTE — Patient Instructions (Signed)
Monitor for any signs/symptoms of infection. Call the office immediately if any occur or go directly to the emergency room. Call with any questions/concerns. Wear CAM boot at all times, even at night.

## 2014-08-29 NOTE — Progress Notes (Signed)
DOS 08/23/2014 Right foot endoscopic plantar fasciotomy verses open plantar fasciotomy, heel spur resection, platelet rich plasma.

## 2014-09-08 ENCOUNTER — Encounter: Payer: Self-pay | Admitting: Podiatry

## 2014-09-08 ENCOUNTER — Ambulatory Visit (INDEPENDENT_AMBULATORY_CARE_PROVIDER_SITE_OTHER): Payer: BLUE CROSS/BLUE SHIELD | Admitting: Podiatry

## 2014-09-08 VITALS — BP 141/92 | HR 80 | Resp 12

## 2014-09-08 DIAGNOSIS — Z9889 Other specified postprocedural states: Secondary | ICD-10-CM

## 2014-09-08 DIAGNOSIS — M722 Plantar fascial fibromatosis: Secondary | ICD-10-CM

## 2014-09-11 ENCOUNTER — Ambulatory Visit (INDEPENDENT_AMBULATORY_CARE_PROVIDER_SITE_OTHER): Payer: BLUE CROSS/BLUE SHIELD | Admitting: Neurology

## 2014-09-11 DIAGNOSIS — G4733 Obstructive sleep apnea (adult) (pediatric): Secondary | ICD-10-CM | POA: Diagnosis not present

## 2014-09-11 NOTE — Sleep Study (Signed)
Please see the scanned sleep study interpretation located in the procedure tab in the chart view section.  

## 2014-09-11 NOTE — Progress Notes (Signed)
Patient ID: Kristen Moreno, female   DOB: 1960/06/08, 53 y.o.   MRN: [id number]  DOS: 08/23/14 Right plantar fasciotomy, partial heel spur resection, PRP  Subjective: 54 year old female presents the office today 2-weeks status post right plantar fascial release. She presents today for suture removal.  She does state that she stubbed her foot yesterday and some water in symptoms she is having some discomfort to the incision and also for some foot. She does continue to get some discomfort on the plantar aspect of the heel as well as sharp pains the bottom of her heel for which she is concerned about. She does continue to [employer]. She is unsure antibiotics. She continues to take pain medication as needed however it has decreased. She denies any systemic complaints of fevers, chills, nausea, vomiting. Denies any calf pain, chest pain, soreness of breath. No other complaints at this time in no acute changes since last appointment.  Objective: AAO 3, NAD DP/PT pulses palpable, CRT less than 3 seconds Protective sensation intact with Simms Weinstein monofilament. There is no subjective numbness or tingling to her feet. Incisions on both the medial and lateral aspect of the heel well coapted without any evidence of dehiscence and sutures are intact. There is no surrounding erythema, ascending cellulitis, fluctuance, crepitus, malodor, drainage/purulence. There is mild edema over the surgical site. There is mild discomfort along the surgical site mostly along the lateral incision. No other areas of tenderness to bilateral lower extremities. No open lesions or pre-ulcerative lesions. There is no pain with calf compression, swelling, warmth, erythema.  Assessment: 54 year old female status post right foot surgery  Plan: -Treatment options discussed including all alternatives, risks, and complications -Sutures removed without complications. Antibiotic ointment was placed over the incisions followed by dry  sterile dressing. She the dressing clean, dry, intact. She can remove the dressing tomorrow and start to shower as long as the incision remains coapted and there is no proximal to the incision. There is any problems or showering call the office. -Continue use Cam Walker at all times, even at night -Ice and elevation -Pain medication as needed -I discussed with her that there is so heel spur remaining. The spurs are likely encapsulated around tendon/muscle was very difficult to remove. If she continues to have pain should likely need to have another surgery was a larger incision was planned in she wouldn't need to have a more extensive surgery to remove the spurs. This can result in digital deformity and other pain to her foot if a higher spur was removed given the size. She understands she may need a surgery in the future. -Monitor for any clinical signs or symptoms of infection and directed to call the office immediately should any occur or go to the ER. -Follow-up 2 weeks or sooner if any problems arise. In the meantime, encouraged to call the office with any questions, concerns, change in symptoms.   Celesta Gentile, DPM

## 2014-09-14 ENCOUNTER — Telehealth: Payer: Self-pay | Admitting: Neurology

## 2014-09-14 ENCOUNTER — Ambulatory Visit: Payer: BLUE CROSS/BLUE SHIELD

## 2014-09-14 DIAGNOSIS — G4733 Obstructive sleep apnea (adult) (pediatric): Secondary | ICD-10-CM

## 2014-09-14 NOTE — Telephone Encounter (Signed)
Patient seen on 07/07/14, PSG on 07/27/14, CPAP titration on 09/11/14, ins: [employer]. Please call and inform patient that I have entered an order for treatment with positive airway pressure (PAP) treatment of obstructive sleep apnea (OSA). She did well during the latest sleep study with CPAP. We will, therefore, arrange for a machine for home use through a DME (durable medical equipment) company of Her choice; and I will see the patient back in follow-up in about 8-10 weeks. Please also explain to the patient that I will be looking out for compliance data, which can be downloaded from the machine (stored on an SD card, that is inserted in the machine) or via remote access through a modem, that is built into the machine. At the time of the followup appointment we will discuss sleep study results and how it is going with PAP treatment at home. Please advise patient to bring Her machine at the time of the first FU visit, even though this is cumbersome. Bringing the machine for every visit after that will likely not be needed, but often helps for the first visit to troubleshoot if needed. Please re-enforce the importance of compliance with treatment and the need for [place] to monitor compliance data - often an insurance requirement and actually good feedback for the patient as far as how they are doing.  Also remind patient, that any interim PAP machine or mask issues should be first addressed with the DME company, as they can often help better with technical and mask fit issues. Please ask if patient has a preference regarding DME company.  Please also make sure, the patient has a follow-up appointment with me in about 8-10 weeks from the setup date, thanks.  Once you have spoken to the patient - and faxed/routed report to PCP and referring MD (if other than PCP), you can close this encounter, thanks,   Star Age, MD, PhD [facility] ([place])

## 2014-09-15 NOTE — Telephone Encounter (Signed)
I spoke to patient and she is aware of results and recommendation. She would like to start CPAP at home. She has Nurse, mental health and lives in [place]. We were able to make f/u appt. I will send patient and letter to remind about appt and to explain importance of compliance. I will fax study to PCP.

## 2014-09-28 ENCOUNTER — Telehealth: Payer: Self-pay | Admitting: *Deleted

## 2014-09-28 NOTE — Telephone Encounter (Addendum)
Pt states she is having some tingling in her surgical foot, is this normal?  Left message for pt to call with the location, severity and factors present when tingling occurs.  Pt called back states the big toe is numb up the top of her foot to the ankle and she is not able to move the 4, 5th toes.  I informed pt I would like her to make an appt, that the tingling on the top of the surgical foot may be the superficial nerves healing, but I was concerned about that she could not move her 4, 5th toes.  Pt agreed.

## 2014-09-29 ENCOUNTER — Encounter: Payer: Self-pay | Admitting: Podiatry

## 2014-09-29 ENCOUNTER — Ambulatory Visit (INDEPENDENT_AMBULATORY_CARE_PROVIDER_SITE_OTHER): Payer: BLUE CROSS/BLUE SHIELD | Admitting: Podiatry

## 2014-09-29 DIAGNOSIS — Z9889 Other specified postprocedural states: Secondary | ICD-10-CM

## 2014-09-29 DIAGNOSIS — M722 Plantar fascial fibromatosis: Secondary | ICD-10-CM

## 2014-09-29 MED ORDER — OXYCODONE-ACETAMINOPHEN 5-325 MG PO TABS: 1.0000 | ORAL_TABLET | Freq: Four times a day (QID) | ORAL | Status: DC | PRN

## 2014-09-29 MED ORDER — GABAPENTIN 100 MG PO CAPS: 100.0000 mg | ORAL_CAPSULE | Freq: Every day | ORAL | Status: DC

## 2014-10-05 NOTE — Progress Notes (Signed)
Patient ID: Kristen Moreno, female   DOB: 27-Mar-1960, 54 y.o.   MRN: [id number]  DOS: 08/23/14 Right plantar fasciotomy, partial heel spur resection, PRP  Subjective: 54 year old female presents the office today status post right plantar fascial release. She states that she is getting a burning pain on her feet, mostly on the top of her foot. She points the dorsal medial portion of her foot which is the majority of the pain. She also states that her third, fourth, fifth toes are not separating like normal. She continues gets some intermittent pain to her heel. She is continuing the CAM boot. Her pain is currently controlled.  She denies any systemic complaints of fevers, chills, nausea, vomiting. Denies any calf pain, chest pain, soreness of breath. No other complaints at this time in no acute changes since last appointment.  Objective: AAO 3, NAD DP/PT pulses palpable, CRT less than 3 seconds Protective sensation intact with Simms Weinstein monofilament. There is subjective burning/numbness the dorsal medial aspect of the foot which appears to be overlying the saphenous nerve and the superficial peroneal nerve distribution. Incisions on both the medial and lateral aspect of the heel well coapted without any evidence of dehiscence and sutures are intact. There is no surrounding erythema, ascending cellulitis, fluctuance, crepitus, malodor, drainage/purulence. There is mild edema over the surgical site. There is mild discomfort along the surgical site mostly along the lateral incision. There is mild discomfort along the medial incision. There is mild discomfort upon plantar aspect of the calcaneus overlying the plantar fascia the surgical site. There is no pain with medial to lateral compression of the calcaneus.  Her toes on the right foot are able to dorsiflex and plantarflex however she is having difficulty spreading her toes and abduction and adduction to digits 3, 4, 5. There is no pain along the  toes. No other areas of tenderness to bilateral lower extremities. No open lesions or pre-ulcerative lesions. There is no pain with calf compression, swelling, warmth, erythema.  Assessment: 54 year old female status post right foot surgery  Plan: -Treatment options discussed including all alternatives, risks, and complications -Recommended to start of the butter vitamin E cream over on the incisions to help with scar tissue. -Continue use Cam Walker at all times, even at night -Ice and elevation -Pain medication as needed -Due to the burning pain at discussed with her gabapentin to see if this will help. Discussed that this is not the appropriate indication for this medication however she would like to try it. I discussed the side effects the medication. We'll start gabapentin 100 mg at nighttime and discussed that how to titrate up as needed. She understood this. -Range of motion exercises and rehabilitation exercises for plantar fasciitis and for the toes. -Monitor for any clinical signs or symptoms of infection and directed to call the office immediately should any occur or go to the ER. -Follow-up 2 weeks or sooner if any problems arise. In the meantime, encouraged to call the office with any questions, concerns, change in symptoms.   Celesta Gentile, DPM

## 2014-10-06 ENCOUNTER — Encounter: Payer: BLUE CROSS/BLUE SHIELD | Admitting: Podiatry

## 2014-10-13 ENCOUNTER — Ambulatory Visit (INDEPENDENT_AMBULATORY_CARE_PROVIDER_SITE_OTHER): Payer: BLUE CROSS/BLUE SHIELD | Admitting: Podiatry

## 2014-10-13 ENCOUNTER — Encounter: Payer: Self-pay | Admitting: Podiatry

## 2014-10-13 VITALS — BP 169/105 | HR 95 | Resp 18

## 2014-10-13 DIAGNOSIS — M722 Plantar fascial fibromatosis: Secondary | ICD-10-CM

## 2014-10-13 DIAGNOSIS — Z9889 Other specified postprocedural states: Secondary | ICD-10-CM

## 2014-10-13 NOTE — Progress Notes (Signed)
Patient ID: Kristen Moreno, female   DOB: 04-15-1960, 54 y.o.   MRN: [id number]  DOS: 08/23/14 Right plantar fasciotomy, partial heel spur resection, PRP  Subjective: 54 year old female presents the office today status post right plantar fascial release. She states that she is doing well and states "I am ready to go back to work". She is also been taking gabapentin 100 mg at nighttime she states that this is help the numbness to the top of her feet. She does get some sensations still. She is able to wear regular shoe. She states that she gets some discomfort after she stands on her feet for long period time. She denies any swelling to the area and denies any redness. No other complaints at this time. Denies any systemic complaints as fevers, chills, nausea, vomiting. No calf pain, chest pain, shortness of breath.  Objective: AAO 3, NAD; presents in a regular sneaker DP/PT pulses palpable, CRT less than 3 seconds Protective sensation intact with Simms Weinstein monofilament. There is mild subjective burning/numbness but this appears to be decreased.  Incisions on both the medial and lateral aspect of the heel well coapted without any evidence of dehiscence and sutures are intact. There is no surrounding erythema, ascending cellulitis, fluctuance, crepitus, malodor, drainage/purulence. There is mild edema over the surgical site without any associated erythema or increase in warmth. There is no tenderness to palpation along the plateau medial tubercle of the calcaneus at insertion of the plantar fascial this time. There is mild tenderness over the incisions however does appear to be decreased. Range of motion to the digits is improved. No open lesions or pre-ulcerative lesions. No pain with calf compression, swelling, warmth, erythema.  Assessment: 54 year old female status post right foot surgery; improving   Plan: -Treatment options discussed including all alternatives, risks, and  complications -Recommended to start of the butter vitamin E cream over on the incisions to help with scar tissue. -Continue with regular shoe gear as tolerated. Continue orthotics. Discussed with her that she get a gel heel cup as well to help cushion the area needed. -Ice and elevation -Pain medication as needed -Continue gabapentin for the nerve pain. Once the pain subsides that she can discontinue the medication. -Range of motion exercises and rehabilitation exercises for plantar fasciitis and for the toes. -Monitor for any clinical signs or symptoms of infection and directed to call the office immediately should any occur or go to the ER. -Follow-up 6 weeks or sooner if any problems arise. In the meantime, encouraged to call the office with any questions, concerns, change in symptoms.   Celesta Gentile, DPM

## 2014-10-16 ENCOUNTER — Encounter: Payer: Self-pay | Admitting: Podiatry

## 2014-10-18 ENCOUNTER — Ambulatory Visit (INDEPENDENT_AMBULATORY_CARE_PROVIDER_SITE_OTHER): Payer: BLUE CROSS/BLUE SHIELD | Admitting: Podiatry

## 2014-10-18 ENCOUNTER — Telehealth: Payer: Self-pay | Admitting: *Deleted

## 2014-10-18 ENCOUNTER — Encounter: Payer: Self-pay | Admitting: Podiatry

## 2014-10-18 VITALS — BP 146/90 | HR 97 | Resp 15

## 2014-10-18 DIAGNOSIS — Z9889 Other specified postprocedural states: Secondary | ICD-10-CM

## 2014-10-18 DIAGNOSIS — M7731 Calcaneal spur, right foot: Secondary | ICD-10-CM

## 2014-10-18 DIAGNOSIS — M722 Plantar fascial fibromatosis: Secondary | ICD-10-CM

## 2014-10-18 DIAGNOSIS — R609 Edema, unspecified: Secondary | ICD-10-CM

## 2014-10-18 MED ORDER — OXYCODONE-ACETAMINOPHEN 5-325 MG PO TABS: 1.0000 | ORAL_TABLET | Freq: Four times a day (QID) | ORAL | Status: DC | PRN

## 2014-10-18 MED ORDER — METHYLPREDNISOLONE 4 MG PO TBPK: ORAL_TABLET | ORAL | Status: DC

## 2014-10-18 NOTE — Progress Notes (Signed)
Patient ID: Kristen Moreno, female   DOB: 19-May-1960, 54 y.o.   MRN: [id number]  Subjective: 54 year old female presents the office today for concerns of increasing swelling and pain which started yesterday after she moved back to work full-time. She states that she gets sharp pain to the bottom of her heel as well. She gets some throbbing pain on the medial aspect of the heel along the surgical site. She denies any increase in warmth with the swelling or any redness. She states the incisions remain healed and scarred has a no drainage. She denies any systemic complaints as fevers, chills, nausea, vomiting. No calf pain, chest pain, shortness of breath. No other complaints at this time in no acute changes since last appointment otherwise.  Objective: AAO 3, NAD DP/PT pulses palpable, CRT less than 3 seconds  Protective sensation intact with Simms Weinstein monofilament  There is tenderness to palpation on the plantar medial tubercle of the calcaneus at the insertion of the plantar fascia over on the surgical site. There is mild tenderness on the medial aspect of the incision along the medial heel. Incision is well-healed with scar. There is increased edema to the area compared to last appointment and there is no associated erythema or increase in warmth. There is no pain along the medial band of plantar fascia within the arch of the foot. There is no area pinpoint bony tenderness or pain the vibratory sensation. No open lesions or pre-ulcerative lesions. There is no pain with calf compression, swelling, warmth, erythema.   Assessment: 54 year old female with increased right foot pain and swelling status post plantar fasciotomy  Plan: -Treatment options discussed including all alternatives, risks, and complications -Her symptoms started to increase after going back to work on Monday. She had to stop working due to the pain. -Haematologist applied -Rx Medrol dosepack  -Refilled Percocet -Return to [employer].  -Orthotics were sent back modifications to help offload the right heel more. We'll increase the cut out within the heel to help take pressure off this area. -Discussed with her she may need to return to surgery for heel spur resection to remove the calcification within the plantar fascia. She understands this a possibility and is open to further surgical intervention.  -Follow-up in 2 weeks or sooner if any problems arise. In the meantime, encouraged to call the office with any questions, concerns, change in symptoms.   Celesta Gentile, DPM

## 2014-10-18 NOTE — Telephone Encounter (Signed)
Pt states her surgery foot is swollen and very painful, she is going to stop by today after 300pm.  Left message informing pt would check status when arrived.

## 2014-10-31 ENCOUNTER — Ambulatory Visit (INDEPENDENT_AMBULATORY_CARE_PROVIDER_SITE_OTHER): Payer: BLUE CROSS/BLUE SHIELD | Admitting: Podiatry

## 2014-10-31 ENCOUNTER — Encounter: Payer: Self-pay | Admitting: Podiatry

## 2014-10-31 VITALS — BP 150/82 | HR 106 | Resp 18

## 2014-10-31 DIAGNOSIS — M7731 Calcaneal spur, right foot: Secondary | ICD-10-CM

## 2014-10-31 DIAGNOSIS — M722 Plantar fascial fibromatosis: Secondary | ICD-10-CM

## 2014-10-31 DIAGNOSIS — Z9889 Other specified postprocedural states: Secondary | ICD-10-CM

## 2014-11-02 NOTE — Progress Notes (Signed)
Patient ID: Kristen Moreno, female   DOB: 09-09-1960, 54 y.o.   MRN: [id number]  Subjective: 54 year old female presents the office today for follow-up evaluation of right foot pain and swelling. She states that since last appointment she has completed a course of steroids. She also states that her pain is significantly improved compared to what it was last appointment and she is able to in plate without any difficulty. She does to that his little swollen today as she is been on her feet all day which is very mild tenderness. She has remained out of work since last appointment. She denies any systemic complaints as fevers, chills, nausea, vomiting. No calf pain, chest pain, shortness of breath. No other complaints at this time in no acute changes since last appointment otherwise.  Objective: AAO 3, NAD DP/PT pulses palpable, CRT less than 3 seconds  Protective sensation intact with Simms Weinstein monofilament  There is significant decrease in tenderness to palpation on the plantar medial tubercle of the calcaneus at the insertion of plantar fascia and along the surgical site. There is mild to palpation on the medial incision how there is no pain on the plantar aspect. There is mild edema overlying the area however this is significantly improved. There is no associated erythema or increase in warmth. There is no other areas of tenderness to bilateral lower extremity's. There is no area pinpoint bony tenderness or pain the vibratory sensation. There are no open lesions or pre-ulcerative lesions. The incisions from prior surgery are well-healed scar without any signs of infection. There is no pain with calf compression, swelling, warmth, erythema.   Assessment: 54 year old female with significantly decreased right foot pain and swelling status post plantar fasciotomy  Plan: -Treatment options discussed including all alternatives, risks, and complications -At this time continue the regular shoe as  tolerated. I discussed with her stretching icing it is on a consistent basis daily. Also continue supportive shoe gear. The orthotics that I sent back for modifications were also dispensed to her today. She has also purchased a  gel insert which seems to help as well. I recommended her to try orthotic and see what helps the most. -At this time she is requesting to go back to work. 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 However as her pain is decreased and she is able to able either any problems I will return her to work. Continue to monitor closely for any reoccurrence of symptoms. I'll see her back in 3 weeks or sooner if any problems are to arise. I encouraged her to call the office the questions, concerns, change in symptoms.  Kristen Moreno, DPM

## 2014-11-20 ENCOUNTER — Telehealth: Payer: Self-pay

## 2014-11-20 NOTE — Telephone Encounter (Signed)
Tried to call but not a working number.

## 2014-11-20 NOTE — Telephone Encounter (Signed)
Patient's phone is not accepting calls. I will try again later to see if patient can bring in memory card from CPAP machine. We need recent download.

## 2014-11-21 ENCOUNTER — Telehealth: Payer: Self-pay

## 2014-11-21 ENCOUNTER — Ambulatory Visit: Payer: Self-pay | Admitting: Neurology

## 2014-11-21 NOTE — Telephone Encounter (Signed)
Patient did not show to appt today  

## 2014-11-23 ENCOUNTER — Encounter: Payer: Self-pay | Admitting: Neurology

## 2014-11-24 ENCOUNTER — Encounter: Payer: Self-pay | Admitting: Podiatry

## 2014-11-24 ENCOUNTER — Ambulatory Visit (INDEPENDENT_AMBULATORY_CARE_PROVIDER_SITE_OTHER): Payer: BLUE CROSS/BLUE SHIELD | Admitting: Podiatry

## 2014-11-24 VITALS — BP 133/73 | HR 90 | Resp 18

## 2014-11-24 DIAGNOSIS — M7731 Calcaneal spur, right foot: Secondary | ICD-10-CM | POA: Diagnosis not present

## 2014-11-24 MED ORDER — OXYCODONE-ACETAMINOPHEN 5-325 MG PO TABS: 1.0000 | ORAL_TABLET | Freq: Four times a day (QID) | ORAL | Status: DC | PRN

## 2014-11-24 NOTE — Patient Instructions (Signed)
Pre-Operative Instructions  Congratulations, you have decided to take an important step to improving your quality of life.  You can be assured that the doctors of [facility] will be with you every step of the way.  1. Plan to be at the surgery center/hospital at least 1 (one) hour prior to your scheduled time unless otherwise directed by the surgical center/hospital staff.  You must have a responsible adult accompany you, remain during the surgery and drive you home.  Make sure you have directions to the surgical center/hospital and know how to get there on time. 2. For hospital based surgery you will need to obtain a history and physical form from your family physician within 1 month prior to the date of surgery- we will give you a form for you primary physician.  3. We make every effort to accommodate the date you request for surgery.  There are however, times where surgery dates or times have to be moved.  We will contact you as soon as possible if a change in schedule is required.   4. No Aspirin/Ibuprofen for one week before surgery.  If you are on aspirin, any non-steroidal anti-inflammatory medications (Mobic, Aleve, Ibuprofen) you should stop taking it 7 days prior to your surgery.  You make take Tylenol  For pain prior to surgery.  5. Medications- If you are taking daily heart and blood pressure medications, seizure, reflux, allergy, asthma, anxiety, pain or diabetes medications, make sure the surgery center/hospital is aware before the day of surgery so they may notify you which medications to take or avoid the day of surgery. 6. No food or drink after midnight the night before surgery unless directed otherwise by surgical center/hospital staff. 7. No alcoholic beverages 24 hours prior to surgery.  No smoking 24 hours prior to or 24 hours after surgery. 8. Wear loose pants or shorts- loose enough to fit over bandages, boots, and casts. 9. No slip on shoes, sneakers are best. 10. Bring  your boot with you to the surgery center/hospital.  Also bring crutches or a walker if your physician has prescribed it for you.  If you do not have this equipment, it will be provided for you after surgery. 11. If you have not been contracted by the surgery center/hospital by the day before your surgery, call to confirm the date and time of your surgery. 12. Leave-time from work may vary depending on the type of surgery you have.  Appropriate arrangements should be made prior to surgery with your employer. 13. Prescriptions will be provided immediately following surgery by your doctor.  Have these filled as soon as possible after surgery and take the medication as directed. 14. Remove nail polish on the operative foot. 15. Wash the night before surgery.  The night before surgery wash the foot and leg well with the antibacterial soap provided and water paying special attention to beneath the toenails and in between the toes.  Rinse thoroughly with water and dry well with a towel.  Perform this wash unless told not to do so by your physician.  Enclosed: 1 Ice pack (please put in freezer the night before surgery)   1 Hibiclens skin cleaner   Pre-op Instructions  If you have any questions regarding the instructions, do not hesitate to call our office.  [place]: [address] [place], [place] [address] [phone]  [place]: [address] [place],  [address] [phone]  [place]: [address]  [place],  [address] [phone]  Dr. Richard   Tuchman DPM, Dr. Norman Regal DPM Dr. Richard Sikora DPM, Dr. M. Todd Hyatt DPM, Dr. Kathryn Egerton DPM, Dr. Matthew Wagoner DPM 

## 2014-11-28 ENCOUNTER — Telehealth: Payer: Self-pay | Admitting: *Deleted

## 2014-11-29 NOTE — Telephone Encounter (Signed)
"  I'm supposed to have surgery.  I did all the paperwork.  Dr. Jacqualyn Posey was waiting on me to make a date.  Could you all give me a call to make a date so I can file it with my job?  Thank you."  Patient came by the office on 11/29/2014.  I gave her a date of 12/20/2014.

## 2014-11-30 NOTE — Progress Notes (Signed)
Patient ID: [name], female   DOB: 1960/12/30, 54 y.o.   MRN: [id number]  Subjective: 54 year old female presented also for concerns of ongoing right foot pain. She said that she does get some swelling and she has gone back to work for which she does wear an Ace bandage which seems to help. She does continue to get pain after she stands at work for long periods of time. She has sharp pain in the bottom of her heels which continues. Her pain does appear to be decreased from the surgery although she does still experience the pain in the bottoms of her heel on the right side. At this time should the discuss further surgery to have a more extensive procedure to remove  The bone spurs. She denies any recent injury or trauma. No other complaints at this time.  Objective: AAO 3, NAD DP/PT pulses palpable 2/4, CRT less than 3 seconds Protective sensation is intact with Semmes-Weinstein monofilament There is continued tenderness to palpation on the plantar calcaneus and along the insertion of the plantar fascia into the inferior portion the calcaneus. There is no pain on course the medial band of plantar fascia with the arch of the foot. There is no pain with lateral compression of the calcaneus. There is no other areas of tenderness to bilateral lower extremities. No pain on the Achilles tendon. No pain along the flexor tendons,  Navicular tuberosity , ankle ankle ligaments. MMT 5/5, ROM WNL.  There is no overlying edema, erythema, increase in warmth.  No open lesions or pre-ulcerative lesions. The incision from prior surgery is well-healed. No other complaints at this time.  Assessment: 54 year old female continuation of right heel pain likely result of calcaneal spurring.   Plan: -Treatment options discussed including all alternatives, risks, and complications -Etiology of symptoms were discussed -At this time she wishes to proceed with further surgery to remove all the bone spurs or as much as  possible. I discussed with her a larger incision and there is more complications after removing this, heel spurs. I also discussed with her that this is not a guarantee resolution of symptoms. She understands all this and she wishes to proceed. -The incision placement as well as the postoperative course was discussed with the patient. I discussed risks of the surgery which include, but not limited to, infection, bleeding, pain, swelling, need for further surgery, delayed or nonhealing, painful or ugly scar, numbness or sensation changes, over/under correction, recurrence, transfer lesions, further deformity/toe deformity, hardware failure, pain to the outside of her ankle, DVT/PE, loss of toe/foot. Patient understands these risks and wishes to proceed with surgery. The surgical consent was reviewed with the patient all 3 pages were signed. No promises or guarantees were given to the outcome of the procedure. All questions were answered to the best of my ability. Before the surgery the patient was encouraged to call the office if there is any further questions. The surgery will be performed at the [facility] on an outpatient basis.  Celesta Gentile, DPM

## 2014-12-13 ENCOUNTER — Telehealth: Payer: Self-pay | Admitting: *Deleted

## 2014-12-13 NOTE — Telephone Encounter (Signed)
"  I'm calling regarding patient's request for disability.  Is she scheduled for surgery on 12/20/2014?  What's the procedure and diagnosis codes?  What's an estimate of how long she may be out of work?"  Patient is scheduled for 12/20/2014 for surgery.  She is having a Heel Spur Resection and diagnosis is Heel Spur.  She may be out of work 6-8 weeks.

## 2014-12-20 ENCOUNTER — Telehealth: Payer: Self-pay | Admitting: *Deleted

## 2014-12-20 DIAGNOSIS — M7730 Calcaneal spur, unspecified foot: Secondary | ICD-10-CM | POA: Diagnosis not present

## 2014-12-20 NOTE — Telephone Encounter (Signed)
"  Patient's daughter is here with a prescription.  There is no date on it.  I need to verify when it was written."  It was written today, patient had surgery today.

## 2014-12-20 NOTE — Telephone Encounter (Signed)
Sorry thanks

## 2014-12-25 ENCOUNTER — Telehealth: Payer: Self-pay | Admitting: *Deleted

## 2014-12-25 NOTE — Telephone Encounter (Signed)
"  Calling to confirm that she had surgery on 12/20/2014, which Baxter Flattery confirmed.  She was not able to give me the CPT code so she transferred me to you."  She did have surgery and the CPT code is [phone].  "Does she have a follow-up visit scheduled?"  She is scheduled for 12/29/2014.

## 2014-12-29 ENCOUNTER — Encounter: Payer: Self-pay | Admitting: Podiatry

## 2014-12-29 ENCOUNTER — Ambulatory Visit (INDEPENDENT_AMBULATORY_CARE_PROVIDER_SITE_OTHER): Payer: BLUE CROSS/BLUE SHIELD

## 2014-12-29 ENCOUNTER — Ambulatory Visit (INDEPENDENT_AMBULATORY_CARE_PROVIDER_SITE_OTHER): Payer: BLUE CROSS/BLUE SHIELD | Admitting: Podiatry

## 2014-12-29 VITALS — BP 139/86 | HR 69 | Resp 12

## 2014-12-29 DIAGNOSIS — M7731 Calcaneal spur, right foot: Secondary | ICD-10-CM | POA: Diagnosis not present

## 2014-12-29 DIAGNOSIS — Z9889 Other specified postprocedural states: Secondary | ICD-10-CM

## 2014-12-31 NOTE — Progress Notes (Signed)
Patient ID: Kristen Moreno, female   DOB: 1960/08/10, 54 y.o.   MRN: [id number]  Subjective: Kristen Moreno is a 54 y.o. is seen today in office s/p R plantar heel spur resection preformed on 12-20-14. They state their pain is improved compared to what it was right after surgery and continues to improve. She does continue to ambulate in a cam boot. Denies any systemic complaints such as fevers, chills, nausea, vomiting. No calf pain, chest pain, shortness of breath.  She has decreased pain medicine she's been taking.  Objective: General: No acute distress, AAOx3; presents weightbearing cam boot. DP/PT pulses palpable 2/4, CRT < 3 sec to all digits.  Protective sensation intact. Motor function intact.  Right foot: Incision is well coapted without any evidence of dehiscence and sutures intact. There is no surrounding erythema, ascending cellulitis, fluctuance, crepitus, malodor, drainage/purulence. There is minimal edema around the surgical site. There is mild pain along the surgical site.  No other areas of tenderness to bilateral lower extremities.  No other open lesions or pre-ulcerative lesions.  No pain with calf compression, swelling, warmth, erythema.   Assessment and Plan:  Status post right heel spur resection, doing well with no complications   -Treatment options discussed including all alternatives, risks, and complications -Antibiotic ointment placed over the incision followed by dry sterile dressing. Keep dressing clean, dry, intact. -Continue weightbearing as tolerated in cam boot. Wear boot at all times, even at night.  -Ice/elevation -Pain medication as needed. -Monitor for any clinical signs or symptoms of infection and DVT/PE and directed to call the office immediately should any occur or go to the ER. -Discussed with her that she is to be off work and brace probably for the next 2 weeks. If at that time for pain is improved and she is able to wear regular shoe she can go  back to work however until she is able to wear regular shoe she is to hold off on returning. -Follow-up in 1 week for possible suture removal or sooner if any problems arise. In the meantime, encouraged to call the office with any questions, concerns, change in symptoms.   Celesta Gentile, DPM

## 2015-01-01 DIAGNOSIS — M7730 Calcaneal spur, unspecified foot: Secondary | ICD-10-CM

## 2015-01-01 HISTORY — DX: Calcaneal spur, unspecified foot: M77.30

## 2015-01-05 ENCOUNTER — Ambulatory Visit (INDEPENDENT_AMBULATORY_CARE_PROVIDER_SITE_OTHER): Payer: BLUE CROSS/BLUE SHIELD | Admitting: Podiatry

## 2015-01-05 DIAGNOSIS — M7731 Calcaneal spur, right foot: Secondary | ICD-10-CM

## 2015-01-05 DIAGNOSIS — Z9889 Other specified postprocedural states: Secondary | ICD-10-CM

## 2015-01-06 ENCOUNTER — Encounter: Payer: Self-pay | Admitting: Podiatry

## 2015-01-08 ENCOUNTER — Ambulatory Visit (INDEPENDENT_AMBULATORY_CARE_PROVIDER_SITE_OTHER): Payer: BLUE CROSS/BLUE SHIELD | Admitting: Podiatry

## 2015-01-08 ENCOUNTER — Encounter: Payer: Self-pay | Admitting: Podiatry

## 2015-01-08 VITALS — BP 147/91 | HR 86 | Resp 18

## 2015-01-08 DIAGNOSIS — T8131XA Disruption of external operation (surgical) wound, not elsewhere classified, initial encounter: Secondary | ICD-10-CM

## 2015-01-08 DIAGNOSIS — Z9889 Other specified postprocedural states: Secondary | ICD-10-CM

## 2015-01-08 NOTE — Progress Notes (Signed)
Patient ID: Kristen Moreno, female   DOB: 20-Jun-1960, 54 y.o.   MRN: [id number]  Subjective: [name] is a 54 y.o. is seen today in office s/p R plantar heel spur resection preformed on 12-20-14. She presents today for suture removal. She says the pain has somewhat decreased but she gets to have pain to the bottom of her heel. She has been continuing to him the CAM boot.  Denies any systemic complaints such as fevers, chills, nausea, vomiting. No calf pain, chest pain, shortness of breath.  She has decreased pain medicine she's been taking.  Objective: General: No acute distress, AAOx3; presents weightbearing cam boot. DP/PT pulses palpable 2/4, CRT < 3 sec to all digits.  Protective sensation intact. Motor function intact.  Right foot: Incision is well coapted without any evidence of dehiscence and sutures intact. There is no surrounding erythema, ascending cellulitis, fluctuance, crepitus, malodor, drainage/purulence. There is minimal edema around the surgical site. There is mild pain along the surgical site which continues. No other areas of tenderness to bilateral lower extremities.  No other open lesions or pre-ulcerative lesions.  No pain with calf compression, swelling, warmth, erythema.   Assessment and Plan:  Status post right heel spur resection, doing well  -Treatment options discussed including all alternatives, risks, and complications -Sutures removed today. Antibiotic ointment placed over the incision followed by dry sterile dressing. Keep dressing clean, dry, intact. Can remove tomorrow instructed to shower. -She can start to transition out of the CAM boot as tolerated into a regular shoe. Discussed this needs to be a gradual transition. Continue with the cam boot or night splint at night. -Ice/elevation -Pain medication as needed. -Monitor for any clinical signs or symptoms of infection and DVT/PE and directed to call the office immediately should any occur or go to the  ER. -She is an expected return to work date is February 1. At that point she'll likely go back half days with the use of the cam boot. At approximately February 13 expect her to be returning to work full-time without any restrictions. -Follow-up in 3 weeks or sooner if any problems arise. In the meantime, encouraged to call the office with any questions, concerns, change in symptoms.   Celesta Gentile, DPM

## 2015-01-08 NOTE — Progress Notes (Signed)
Patient ID: Kristen Moreno, female   DOB: 24-Jul-1960, 54 y.o.   MRN: [id number]  Subjective: Kristen Moreno is a 54 y.o. is seen today in office s/p R plantar heel spur resection preformed on 12-20-14. She presents today as she states that the incision did open up over the weekend and she has had some bloody drainage. No surrounding erythema or increase in edema. No pus. She has been continuing to him the CAM boot.  Denies any systemic complaints such as fevers, chills, nausea, vomiting. No calf pain, chest pain, shortness of breath.  She has decreased pain medicine she's been taking.  Objective: General: No acute distress, AAOx3; presents weightbearing cam boot. DP/PT pulses palpable 2/4, CRT < 3 sec to all digits.  Protective sensation intact. Motor function intact.  Right foot: Incision is well coapted without any evidence of dehiscence along the distal portion but along the proximal incision there is a small, superficial area of dehiscence with granular wound base. There is no surrounding erythema, ascending cellulitis, fluctuance, crepitus, malodor, drainage/purulence. There is minimal edema around the surgical site but grossly unchanged from last appointment. There is mild pain along the surgical site which continues, mostly over the incision.  No other areas of tenderness to bilateral lower extremities.  No other open lesions or pre-ulcerative lesions.  No pain with calf compression, swelling, warmth, erythema.   Assessment and Plan:  Status post right heel spur resection with superficial wound dehiscence.   -Treatment options discussed including all alternatives, risks, and complications -Steri-strips were placed. There is only a small area of opening and it appears to be superficial an no signs of infection. Can change the dressing daily with antibiotic ointment and bandage.  -Continue with CAM boot for now.  -Ice/elevation -Pain medication as needed. -Monitor for any clinical signs  or symptoms of infection and DVT/PE and directed to call the office immediately should any occur or go to the ER. -She is an expected return to work date is February 1. At that point she'll likely go back half days with the use of the cam boot. At approximately February 13 expect her to be returning to work full-time without any restrictions. -Follow-up as scheduled or sooner if any problems arise. In the meantime, encouraged to call the office with any questions, concerns, change in symptoms.   Celesta Gentile, DPM

## 2015-01-16 ENCOUNTER — Telehealth: Payer: Self-pay | Admitting: *Deleted

## 2015-01-16 NOTE — Telephone Encounter (Signed)
Pt states the suture line is red and open, has taken one shower, and put on antibiotic ointment, but it looks bad.  I told pt to keep area covered and transferred schedulers to get in as soon as possible.

## 2015-01-19 ENCOUNTER — Encounter: Payer: Self-pay | Admitting: Podiatry

## 2015-01-19 ENCOUNTER — Ambulatory Visit (INDEPENDENT_AMBULATORY_CARE_PROVIDER_SITE_OTHER): Payer: BLUE CROSS/BLUE SHIELD | Admitting: Podiatry

## 2015-01-19 VITALS — BP 159/92 | HR 86 | Resp 18

## 2015-01-19 DIAGNOSIS — Z9889 Other specified postprocedural states: Secondary | ICD-10-CM

## 2015-01-19 DIAGNOSIS — M7731 Calcaneal spur, right foot: Secondary | ICD-10-CM

## 2015-01-19 MED ORDER — OXYCODONE-ACETAMINOPHEN 5-325 MG PO TABS: 1.0000 | ORAL_TABLET | Freq: Four times a day (QID) | ORAL | Status: DC | PRN

## 2015-01-22 NOTE — Progress Notes (Signed)
Patient ID: Kristen Moreno, female   DOB: 01-15-1961, 55 y.o.   MRN: [id number]  Subjective: Kristen Moreno is a 55 y.o. is seen today in office s/p R plantar heel spur resection preformed on 12-20-14. Since last appointment she status stated the incision did open up some and she has a clear drainage however the incision has closed again. She denies any swelling redness or red streaks. She gets intermittent swelling along the surgical site however does continue to improve slowly. She has been wearing a regular shoe and she has gradually increased female that she is wearing a regular shoe. Denies any systemic complaints such as fevers, chills, nausea, vomiting. No calf pain, chest pain, shortness of breath.  She has decreased pain medicine she's been taking.  Objective: General: No acute distress, AAOx3; presents weightbearing cam boot. DP/PT pulses palpable 2/4, CRT < 3 sec to all digits.  Protective sensation intact. Motor function intact.  Right foot: Incision is well coapted without any evidence of dehiscence at this time. There is no drainage or pus. No swelling erythema, ascending cellulitis, fluctuance, crepitus, malodor, drainage. There is mild edema along the surgical site however does appear to be improved compared to last appointment. There does continue some tenderness on on the incision site into the plantar heel. No other areas of tenderness to bilateral lower extremities.  No other open lesions or pre-ulcerative lesions.  No pain with calf compression, swelling, warmth, erythema.   Assessment and Plan:  Status post right heel spur resection with continued pain.   -Treatment options discussed including all alternatives, risks, and complications -At this time there is no drainage from the wound and the incision appears to be healed. Continue with compression sock or Ace bandage to help with swelling. Continue to slowly transition to a regular shoe. Ice and elevation. Hold off on any  exercising or high-impact activity. Pain medication as needed. -Monitor for any clinical signs or symptoms of infection and DVT/PE and directed to call the office immediately should any occur or go to the ER. -She is an expected return to work date is February 1. At that point she'll likely go back half days with the use of the cam boot. At approximately February 13 expect her to be returning to work full-time without any restrictions. -Follow-up as scheduled or sooner if any problems arise. In the meantime, encouraged to call the office with any questions, concerns, change in symptoms.   Celesta Gentile, DPM

## 2015-01-26 ENCOUNTER — Encounter: Payer: BLUE CROSS/BLUE SHIELD | Admitting: Podiatry

## 2015-01-31 ENCOUNTER — Telehealth: Payer: Self-pay | Admitting: *Deleted

## 2015-02-01 ENCOUNTER — Telehealth: Payer: Self-pay | Admitting: *Deleted

## 2015-02-01 DIAGNOSIS — M7731 Calcaneal spur, right foot: Secondary | ICD-10-CM

## 2015-02-01 DIAGNOSIS — Z9889 Other specified postprocedural states: Secondary | ICD-10-CM

## 2015-02-01 NOTE — Telephone Encounter (Signed)
"  I'm calling to confirm her right ankle surgery on August 3rd, 2016.  If she was seen for that same condition any other time from 03/29/2014 and November 17, 2014 besides surgery date.  If you can provide me all the appointment dates in between for ankle condition.  I have a confidential voicemail.  I'm on the phone a lot.  Please leave me a message in my voicemail.  Claim number is [id number] claim number.  Leave claim number, name and date of birth."

## 2015-02-01 NOTE — Telephone Encounter (Addendum)
Pt presents to office states Dr. Jacqualyn Posey wrote PT rx at the [facility] facility, and pt states she lost the form.  Dr. Jacqualyn Posey states PT with [facility] for status post right heel spur resection, evaluate and treat, focusing on strength training and flexibility.   Faxed referral to [facility].

## 2015-02-09 ENCOUNTER — Encounter: Payer: Self-pay | Admitting: Podiatry

## 2015-02-09 ENCOUNTER — Ambulatory Visit (INDEPENDENT_AMBULATORY_CARE_PROVIDER_SITE_OTHER): Payer: Managed Care, Other (non HMO) | Admitting: Podiatry

## 2015-02-09 VITALS — BP 166/95 | HR 92 | Resp 18

## 2015-02-09 DIAGNOSIS — Z9889 Other specified postprocedural states: Secondary | ICD-10-CM

## 2015-02-09 DIAGNOSIS — M7731 Calcaneal spur, right foot: Secondary | ICD-10-CM

## 2015-02-12 NOTE — Progress Notes (Signed)
Patient ID: Kristen Moreno, female   DOB: Dec 04, 1960, 55 y.o.   MRN: [id number]  Subjective: Kristen Moreno is a 55 y.o. is seen today in office s/p R plantar heel spur resection preformed on 12-20-14. She states that she is doing much better to her right foot. She does get some discomfort although it is improving. She did go to physical therapy once however due to cost she did not go back of the shoulder exercises which she has been doing which is been helping quite a bit.  Denies any systemic complaints such as fevers, chills, nausea, vomiting. No calf pain, chest pain, shortness of breath.  She has decreased pain medicine she's been taking.  Objective: General: No acute distress, AAOx3; presents weightbearing cam boot. DP/PT pulses palpable 2/4, CRT < 3 sec to all digits.  Protective sensation intact. Motor function intact.  Right foot: Incision is well coapted without any evidence of dehiscence at this time. Hyperkeratotic tissues overlying the incision. Upon debridement no underlying ulceration, dehiscence or other signs of infection. There is no drainage or pus. No surrounding erythema, ascending cellulitis, fluctuance, crepitus, malodor, drainage. There is decreased edema along the surgical site however does appear to be improved compared to last appointment. There does continue some tenderness on the plantar heel, although it has improved. No other areas of tenderness to bilateral lower extremities.  No other open lesions or pre-ulcerative lesions.  No pain with calf compression, swelling, warmth, erythema.   Assessment and Plan:  Status post right heel spur resection with improving pain.   -Treatment options discussed including all alternatives, risks, and complications -Continue with stretching, rehabilitation exercises. Ice to the area. Continue orthotics and supportive shoe gear. Moisturizer to the incision daily.  -She cannot work half day per her employer. Because of this I  expected return to work date of February 13 without restrictions. -Follow-up before expected return to work date or sooner if any problems arise. In the meantime, encouraged to call the office with any questions, concerns, change in symptoms.   Celesta Gentile, DPM

## 2015-03-02 ENCOUNTER — Ambulatory Visit (INDEPENDENT_AMBULATORY_CARE_PROVIDER_SITE_OTHER): Payer: Managed Care, Other (non HMO) | Admitting: Podiatry

## 2015-03-02 ENCOUNTER — Encounter: Payer: Self-pay | Admitting: Podiatry

## 2015-03-02 VITALS — BP 133/101 | HR 92 | Resp 18

## 2015-03-02 DIAGNOSIS — Z9889 Other specified postprocedural states: Secondary | ICD-10-CM

## 2015-03-02 DIAGNOSIS — M7731 Calcaneal spur, right foot: Secondary | ICD-10-CM

## 2015-03-05 NOTE — Progress Notes (Signed)
Patient ID: Kristen Moreno, female   DOB: April 05, 1960, 55 y.o.   MRN: [id number]  Subjective: Kristen Moreno is a 55 y.o. is seen today in office s/p R plantar heel spur resection preformed on 12-20-14. She states that she continues to do much better to her right foot. She is angling a regular shoe with her orthotic. She is scheduled to go back to work next week. She is able to do her daily activities at this point with very minimal pain. She gets an occasional sharp pain to her foot but is greatly improved.  Denies any systemic complaints such as fevers, chills, nausea, vomiting. No calf pain, chest pain, shortness of breath.  She has decreased pain medicine she's been taking.  Objective: General: No acute distress, AAOx3; presents weightbearing cam boot. DP/PT pulses palpable 2/4, CRT < 3 sec to all digits.  Protective sensation intact. Motor function intact.  Right foot: Incision is well coapted without any evidence of dehiscence at this time and a scar has formed. No surrounding erythema, ascending cellulitis, fluctuance, crepitus, malodor, drainage. There is trace edema along the surgical site. There is curling no tenderness to palpation on the plantar aspect heel or along the incision sites. No other areas of tenderness bilateral lower extremities. No other open lesions or pre-ulcerative lesions.  No pain with calf compression, swelling, warmth, erythema.   Assessment and Plan:  Status post right heel spur resection, improving.   -Treatment options discussed including all alternatives, risks, and complications -Continue with stretching, rehabilitation exercises. Ice to the area. Continue orthotics and supportive shoe gear. Moisturizer to the incision daily.  -She can return to work next week. -Follow-up as scheduled or sooner if any problems arise. In the meantime, encouraged to call the office with any questions, concerns, change in symptoms.   Celesta Gentile, DPM

## 2015-03-06 ENCOUNTER — Telehealth: Payer: Self-pay | Admitting: *Deleted

## 2015-03-06 ENCOUNTER — Encounter: Payer: Self-pay | Admitting: Podiatry

## 2015-03-06 MED ORDER — METHYLPREDNISOLONE 4 MG PO TBPK: ORAL_TABLET | ORAL | Status: DC

## 2015-03-06 NOTE — Telephone Encounter (Addendum)
Pt states she is hurting so bad in her heel, she doesn't know what to do.  I told pt she would need to rest, and ice, but pt states she is unable to do so at work and they will not let her off.  INFORMED PT OF DR. Leigh Aurora orders for medrol dose pack, gel inserts and appt for injection.  Medication was sent to [facility].  Pt states she already has the gel inserts and is icing, I offered pt the appt and she said she wanted an injection.  Transferred to schedulers.03/07/2015-MEDROL DOSE PACK WAS printed not escribed to pharmach, I called to [facility] on [address].

## 2015-03-06 NOTE — Telephone Encounter (Signed)
Lets do a 6 day medrol dose pack. She can try to get a gel insert to put over her orthotic to see if that helps. She can come in to get an injection if needed

## 2015-03-07 ENCOUNTER — Encounter: Payer: Self-pay | Admitting: Podiatry

## 2015-03-08 ENCOUNTER — Emergency Department (HOSPITAL_COMMUNITY): Payer: Managed Care, Other (non HMO)

## 2015-03-08 ENCOUNTER — Emergency Department (HOSPITAL_COMMUNITY)
Admission: EM | Admit: 2015-03-08 | Discharge: 2015-03-08 | Disposition: A | Discharge status: Home / Self Care | Payer: Managed Care, Other (non HMO) | Attending: Emergency Medicine | Admitting: Emergency Medicine

## 2015-03-08 ENCOUNTER — Encounter (HOSPITAL_COMMUNITY): Payer: Self-pay

## 2015-03-08 DIAGNOSIS — I10 Essential (primary) hypertension: Secondary | ICD-10-CM | POA: Diagnosis not present

## 2015-03-08 DIAGNOSIS — M1712 Unilateral primary osteoarthritis, left knee: Secondary | ICD-10-CM | POA: Insufficient documentation

## 2015-03-08 DIAGNOSIS — R05 Cough: Secondary | ICD-10-CM | POA: Diagnosis present

## 2015-03-08 DIAGNOSIS — Z8742 Personal history of other diseases of the female genital tract: Secondary | ICD-10-CM | POA: Insufficient documentation

## 2015-03-08 DIAGNOSIS — Z8639 Personal history of other endocrine, nutritional and metabolic disease: Secondary | ICD-10-CM | POA: Diagnosis not present

## 2015-03-08 DIAGNOSIS — Z79899 Other long term (current) drug therapy: Secondary | ICD-10-CM | POA: Insufficient documentation

## 2015-03-08 DIAGNOSIS — F17210 Nicotine dependence, cigarettes, uncomplicated: Secondary | ICD-10-CM | POA: Insufficient documentation

## 2015-03-08 DIAGNOSIS — R059 Cough, unspecified: Secondary | ICD-10-CM

## 2015-03-08 DIAGNOSIS — R197 Diarrhea, unspecified: Secondary | ICD-10-CM

## 2015-03-08 DIAGNOSIS — B349 Viral infection, unspecified: Secondary | ICD-10-CM

## 2015-03-08 DIAGNOSIS — K219 Gastro-esophageal reflux disease without esophagitis: Secondary | ICD-10-CM | POA: Diagnosis not present

## 2015-03-08 DIAGNOSIS — Q613 Polycystic kidney, unspecified: Secondary | ICD-10-CM | POA: Diagnosis not present

## 2015-03-08 MED ORDER — BISMUTH SUBSALICYLATE 262 MG/15ML PO SUSP: 30.0000 mL | Freq: Four times a day (QID) | ORAL | Status: DC | PRN

## 2015-03-08 MED ORDER — GUAIFENESIN 100 MG/5ML PO LIQD: 100.0000 mg | ORAL | Status: DC | PRN

## 2015-03-08 NOTE — Discharge Instructions (Signed)
Diarrhea Diarrhea is frequent loose and watery bowel movements. It can cause you to feel weak and dehydrated. Dehydration can cause you to become tired and thirsty, have a dry mouth, and have decreased urination that often is dark yellow. Diarrhea is a sign of another problem, most often an infection that will not last long. In most cases, diarrhea typically lasts 2-3 days. However, it can last longer if it is a sign of something more serious. It is important to treat your diarrhea as directed by your caregiver to lessen or prevent future episodes of diarrhea. CAUSES  Some common causes include:  Gastrointestinal infections caused by viruses, bacteria, or parasites.  Food poisoning or food allergies.  Certain medicines, such as antibiotics, chemotherapy, and laxatives.  Artificial sweeteners and fructose.  Digestive disorders. HOME CARE INSTRUCTIONS  Ensure adequate fluid intake (hydration): Have 1 cup (8 oz) of fluid for each diarrhea episode. Avoid fluids that contain simple sugars or sports drinks, fruit juices, whole milk products, and sodas. Your urine should be clear or pale yellow if you are drinking enough fluids. Hydrate with an oral rehydration solution that you can purchase at pharmacies, retail stores, and online. You can prepare an oral rehydration solution at home by mixing the following ingredients together:   - tsp table salt.   tsp baking soda.   tsp salt substitute containing potassium chloride.  1  tablespoons sugar.  1 L (34 oz) of water.  Certain foods and beverages may increase the speed at which food moves through the gastrointestinal (GI) tract. These foods and beverages should be avoided and include:  Caffeinated and alcoholic beverages.  High-fiber foods, such as raw fruits and vegetables, nuts, seeds, and whole grain breads and cereals.  Foods and beverages sweetened with sugar alcohols, such as xylitol, sorbitol, and mannitol.  Some foods may be well  tolerated and may help thicken stool including:  Starchy foods, such as rice, toast, pasta, low-sugar cereal, oatmeal, grits, baked potatoes, crackers, and bagels.  Bananas.  Applesauce.  Add probiotic-rich foods to help increase healthy bacteria in the GI tract, such as yogurt and fermented milk products.  Wash your hands well after each diarrhea episode.  Only take over-the-counter or prescription medicines as directed by your caregiver.  Take a warm bath to relieve any burning or pain from frequent diarrhea episodes. SEEK IMMEDIATE MEDICAL CARE IF:   You are unable to keep fluids down.  You have persistent vomiting.  You have blood in your stool, or your stools are black and tarry.  You do not urinate in 6-8 hours, or there is only a small amount of very dark urine.  You have abdominal pain that increases or localizes.  You have weakness, dizziness, confusion, or light-headedness.  You have a severe headache.  Your diarrhea gets worse or does not get better.  You have a fever or persistent symptoms for more than 2-3 days.  You have a fever and your symptoms suddenly get worse. MAKE SURE YOU:   Understand these instructions.  Will watch your condition.  Will get help right away if you are not doing well or get worse.   This information is not intended to replace advice given to you by your health care provider. Make sure you discuss any questions you have with your health care provider.   Document Released: 12/27/2001 Document Revised: 01/27/2014 Document Reviewed: 09/14/2011 Elsevier Interactive Patient Education 2016 Elsevier Inc. Viral Infections A virus is a type of germ. Viruses can cause:  Minor sore throats.  Aches and pains.  Headaches.  Runny nose.  Rashes.  Watery eyes.  Tiredness.  Coughs.  Loss of appetite.  Feeling sick to your stomach (nausea).  Throwing up (vomiting).  Watery poop (diarrhea). HOME CARE   Only take medicines  as told by your doctor.  Drink enough water and fluids to keep your pee (urine) clear or pale yellow. Sports drinks are a good choice.  Get plenty of rest and eat healthy. Soups and broths with crackers or rice are fine. GET HELP RIGHT AWAY IF:   You have a very bad headache.  You have shortness of breath.  You have chest pain or neck pain.  You have an unusual rash.  You cannot stop throwing up.  You have watery poop that does not stop.  You cannot keep fluids down.  You or your child has a temperature by mouth above 102 F (38.9 C), not controlled by medicine.  Your baby is older than 3 months with a rectal temperature of 102 F (38.9 C) or higher.  Your baby is 53 months old or younger with a rectal temperature of 100.4 F (38 C) or higher. MAKE SURE YOU:   Understand these instructions.  Will watch this condition.  Will get help right away if you are not doing well or get worse.   This information is not intended to replace advice given to you by your health care provider. Make sure you discuss any questions you have with your health care provider.   Document Released: 12/20/2007 Document Revised: 03/31/2011 Document Reviewed: 06/14/2014 Elsevier Interactive Patient Education [employer].

## 2015-03-08 NOTE — ED Provider Notes (Signed)
CSN: [id number]     Arrival date & time 03/08/15  0756 History   First MD Initiated Contact with Patient 03/08/15 0825     Chief Complaint  Patient presents with  . Cough  . Diarrhea     (Consider location/radiation/quality/duration/timing/severity/associated sxs/prior Treatment) Patient is a 55 y.o. female presenting with cough and diarrhea. The history is provided by the patient.  Cough Severity:  Moderate Onset quality:  Gradual Associated symptoms: chills   Associated symptoms: no fever   Diarrhea Quality:  Semi-solid and watery Severity:  Moderate Onset quality:  Gradual Number of episodes:  5x yesterday, 1x today Duration:  1 day Timing:  Constant Progression:  Unchanged Relieved by:  Nothing Worsened by:  Nothing tried Ineffective treatments:  None tried Associated symptoms: chills   Associated symptoms: no fever and no vomiting   Risk factors: sick contacts (roommate's girlfriend 2-3 days with similar symptoms)     Past Medical History  Diagnosis Date  . Hypertension   . Ovarian cyst   . Polycystic disease, ovaries   . GERD (gastroesophageal reflux disease)   . Polycystic kidney disease     1998  . Arthritis     LEFT KNEE    Past Surgical History  Procedure Laterality Date  . No past surgeries    . Bladder suspension  08/11/2011    Procedure: TRANSVAGINAL TAPE (TVT) PROCEDURE;  Surgeon: Emily Filbert, MD;  Location: [facility];  Service: Gynecology;  Laterality: N/A;  Add:  Cystoscopy  . Abdominal hysterectomy  2013  . Umbilical hernia repair N/A 07/04/2013    Procedure: HERNIA REPAIR UMBILICAL ;  Surgeon: Harl Bowie, MD;  Location: [facility];  Service: General;  Laterality: N/A;  . Insertion of mesh N/A 07/04/2013    Procedure: INSERTION OF MESH;  Surgeon: Harl Bowie, MD;  Location: [facility];  Service: General;  Laterality: N/A;   Family History  Problem Relation Age of Onset  . Asthma Mother   . Hypertension Mother   . Asthma Father   .  Hypertension Father    Social History  Substance Use Topics  . Smoking status: Current Every Day Smoker -- 0.50 packs/day for 30 years    Types: Cigarettes  . Smokeless tobacco: Never Used  . Alcohol Use: No     Comment: social   OB History    Gravida Para Term Preterm AB TAB SAB Ectopic Multiple Living   5 2 2  3 2 1   2      Review of Systems  Constitutional: Positive for chills. Negative for fever.  Respiratory: Positive for cough.   Gastrointestinal: Positive for nausea and diarrhea. Negative for vomiting.  All other systems reviewed and are negative.     Allergies  Ace inhibitors  Home Medications   Prior to Admission medications   Medication Sig Start Date End Date Taking? Authorizing Provider  amLODipine (NORVASC) 10 MG tablet Take 1 tablet (10 mg total) by mouth at bedtime. 05/02/14  Yes Barton Fanny, MD  diphenhydramine-acetaminophen (TYLENOL PM) 25-500 MG TABS tablet Take 2 tablets by mouth at bedtime as needed (sleep/pain).   Yes Historical Provider, MD  methylPREDNISolone (MEDROL DOSEPAK) 4 MG TBPK tablet Take as directed 03/06/15  Yes Trula Slade, DPM  omeprazole (PRILOSEC) 20 MG capsule Take 20 mg by mouth daily.  08/03/14  Yes Historical Provider, MD  Pseudoephedrine-APAP-DM (DAYQUIL PO) Take 1 Dose by mouth daily as needed (cold symptoms).   Yes Historical Provider, MD  oxyCODONE-acetaminophen (PERCOCET/ROXICET) 5-325 MG tablet Take 1-2 tablets by mouth every 6 (six) hours as needed for severe pain. 01/19/15   Trula Slade, DPM   BP 154/94 mmHg  Pulse 92  Temp(Src) 98.4 F (36.9 C) (Oral)  Resp 20  SpO2 100% Physical Exam  Constitutional: She is oriented to person, place, and time. She appears well-developed and well-nourished. No distress.  HENT:  Head: Normocephalic.  Eyes: Conjunctivae are normal.  Neck: Neck supple. No tracheal deviation present.  Cardiovascular: Normal rate, regular rhythm and normal heart sounds.    Pulmonary/Chest: Effort normal and breath sounds normal. No respiratory distress.  Abdominal: Soft. She exhibits no distension. There is no tenderness. There is no rebound and no guarding.  Neurological: She is alert and oriented to person, place, and time.  Skin: Skin is warm and dry.  Psychiatric: She has a normal mood and affect.    ED Course  Procedures (including critical care time) Labs Review Labs Reviewed - No data to display  Imaging Review No results found. I have personally reviewed and evaluated these images and lab results as part of my medical decision-making.   EKG Interpretation None      MDM   Final diagnoses:  Cough  Diarrhea, unspecified type  Viral illness    55 y.o. female presents with cough and mild diarrheal illness over the last 2 days associated with chills. AFVSS. No signs of respiratory distress, non-toxic appearing, CTAB, no concern for pneumonia with this clinical picture. Reassuring abdominal examination. No emergent testing indicated at this time. Pt discharged with likely viral cough which will be self limited in its course. Recommended bismuth salts and Robitussin for supportive care. Plan to follow up with PCP as needed and return precautions discussed for worsening or new concerning symptoms.     Leo Grosser, MD 03/08/15 [phone]

## 2015-03-08 NOTE — ED Notes (Signed)
Pt with cough/diarrhea since yesterday.  Body aches.  States that she thinks she had a fever but did not check.  No n/v

## 2015-03-13 ENCOUNTER — Ambulatory Visit (INDEPENDENT_AMBULATORY_CARE_PROVIDER_SITE_OTHER): Payer: Managed Care, Other (non HMO) | Admitting: Podiatry

## 2015-03-13 ENCOUNTER — Encounter: Payer: Self-pay | Admitting: Podiatry

## 2015-03-13 VITALS — BP 145/82 | HR 89 | Resp 18

## 2015-03-13 DIAGNOSIS — R52 Pain, unspecified: Secondary | ICD-10-CM

## 2015-03-13 DIAGNOSIS — M7731 Calcaneal spur, right foot: Secondary | ICD-10-CM

## 2015-03-13 DIAGNOSIS — M779 Enthesopathy, unspecified: Secondary | ICD-10-CM

## 2015-03-14 NOTE — Progress Notes (Signed)
Patient ID: Kristen Moreno, female   DOB: Nov 19, 1960, 55 y.o.   MRN: [id number]  Subjective: Kristen Moreno is a 55 y.o. is seen today in office s/p R plantar heel spur resection preformed on 12-20-14. She states that she return back to work yesterday she was doing good with a worse 4-6 hours however she started to have pain to her heel again. She states the pain significantly increased with standing on hard surfaces. She was wearing her orthotics. She states that her that she was able to do her daily activities and her regular that she was doing prior to surgery without any pain. Denies any recent injury or trauma. No significant increase in swelling. No tingling or numbness.  Objective: General: No acute distress, AAOx3; presents weightbearing cam boot. DP/PT pulses palpable 2/4, CRT < 3 sec to all digits.  Protective sensation intact. Motor function intact.  Right foot: Incision is well coapted without any evidence of dehiscence at this time and a scar has formed. No surrounding erythema, ascending cellulitis, fluctuance, crepitus, malodor, drainage. There is trace edema along the surgical site. There is mild tenderness on the medial aspect of the foot overlying the area of trace edema. This toe erythema or increase in warmth. There is no pain with medial to lateral compression of the calcaneus. No other areas of tenderness. No other open lesions or pre-ulcerative lesions.  No pain with calf compression, swelling, warmth, erythema.   Assessment and Plan:  Status post right heel spur resection, with increased pain.  -Treatment options discussed including all alternatives, risks, and complications -At this point discussed various treatment options. She would like not to go back to physical therapy. I did discuss with her  EPAT and she would like to try this. This is not a guarantee resolution of symptoms. She had her first treatment today on the right foot with settings of 2.2, 3000, 15. She  tolerated this without any complications. Hold off on anti-inflammatories.  -Continue with stretching, rehabilitation exercises. Ice to the area. Continue orthotics and supportive shoe gear. Moisturizer to the incision daily.  -Follow-up in 1 week or sooner if any problems arise. In the meantime, encouraged to call the office with any questions, concerns, change in symptoms.  *She would like to wear the CAM boot at work that he's be closed in. She is awaiting the approval from her company in regards to do that we have. If this is approved she'll come back to pick this up.  Celesta Gentile, DPM

## 2015-03-20 ENCOUNTER — Ambulatory Visit (INDEPENDENT_AMBULATORY_CARE_PROVIDER_SITE_OTHER): Payer: Managed Care, Other (non HMO) | Admitting: Podiatry

## 2015-03-20 ENCOUNTER — Encounter: Payer: Self-pay | Admitting: Podiatry

## 2015-03-20 VITALS — BP 136/90 | HR 91 | Resp 18

## 2015-03-20 DIAGNOSIS — M722 Plantar fascial fibromatosis: Secondary | ICD-10-CM

## 2015-03-20 DIAGNOSIS — M7731 Calcaneal spur, right foot: Secondary | ICD-10-CM

## 2015-03-22 NOTE — Progress Notes (Signed)
Patient ID: Kristen Moreno, female   DOB: Nov 20, 1960, 55 y.o.   MRN: [id number]  Subjective: Kristen Moreno is a 55 y.o. is seen today in office s/p R plantar heel spur resection preformed on 12-20-14. She states that overall she is doing much better than she was prior to surgery the sharp pain when she is not working. She is able to wear regular shoe during the day without any problems. However when she goes back to work soon hard surfaces is when she is started to have increasing pain because of this unable to work. She is still waiting to hear back to see if a CAM boot with the toe cover would be acceptable.Denies any recent injury or trauma. No significant increase in swelling. No tingling or numbness.  Objective: General: No acute distress, AAOx3; presents weightbearing cam boot. DP/PT pulses palpable 2/4, CRT < 3 sec to all digits.  Protective sensation intact. Motor function intact.  Right foot: Incision is well coapted without any evidence of dehiscence at this time and a scar has formed. No surrounding erythema, ascending cellulitis, fluctuance, crepitus, malodor, drainage. There is no significant edema along the surgical site today. There is mild tenderness on the medial aspect of the foot. This toe erythema or increase in warmth. There is no pain with medial to lateral compression of the calcaneus. No other areas of tenderness. No other open lesions or pre-ulcerative lesions.  No pain with calf compression, swelling, warmth, erythema.   Assessment and Plan:  Status post right heel spur resection, with increased pain.  -Treatment options discussed including all alternatives, risks, and complications. She was re-evaluated today.  -She had her 2nd treatment today on the right foot with settings of 2, 1000, 20 and in the heel 2.6, 3000, 21.. She tolerated this without any complications. Hold off on anti-inflammatories.  -Continue with stretching, rehabilitation exercises. Ice to the area.  Continue orthotics and supportive shoe gear. Moisturizer to the incision daily.  -Awaiting work approval for CAM boot.  -Follow-up in 1 week or sooner if any problems arise. In the meantime, encouraged to call the office with any questions, concerns, change in symptoms. Celesta Gentile, DPM

## 2015-03-30 ENCOUNTER — Encounter: Payer: Self-pay | Admitting: Podiatry

## 2015-03-30 ENCOUNTER — Ambulatory Visit (INDEPENDENT_AMBULATORY_CARE_PROVIDER_SITE_OTHER): Payer: Managed Care, Other (non HMO) | Admitting: Podiatry

## 2015-03-30 DIAGNOSIS — M792 Neuralgia and neuritis, unspecified: Secondary | ICD-10-CM | POA: Diagnosis not present

## 2015-03-30 DIAGNOSIS — M722 Plantar fascial fibromatosis: Secondary | ICD-10-CM | POA: Diagnosis not present

## 2015-03-30 DIAGNOSIS — M7731 Calcaneal spur, right foot: Secondary | ICD-10-CM

## 2015-03-30 DIAGNOSIS — R262 Difficulty in walking, not elsewhere classified: Secondary | ICD-10-CM

## 2015-03-30 DIAGNOSIS — M24274 Disorder of ligament, right foot: Secondary | ICD-10-CM

## 2015-03-30 MED ORDER — GABAPENTIN 100 MG PO CAPS: 100.0000 mg | ORAL_CAPSULE | Freq: Every day | ORAL | Status: DC

## 2015-04-02 NOTE — Progress Notes (Addendum)
Patient ID: Kristen Moreno, female   DOB: 15-Aug-1960, 55 y.o.   MRN: [id number]  Subjective: Kristen Moreno is a 55 y.o. is seen today in office s/p R plantar heel spur resection preformed on 12-20-14. She states that she is doing about the same. She states that when she is not at work she can wear regular shoe to the having any pain however when she goes to work and standing for prolonged periods on concrete floor she gets pain to her heel on the bottom. She also states that she does get some occasional sharp pain to the bottom of the foot as well. Denies any recent injury or trauma. No significant increase in swelling. No tingling or numbness.  Objective: General: No acute distress, AAOx3; presents weightbearing cam boot. DP/PT pulses palpable 2/4, CRT < 3 sec to all digits.  Protective sensation intact. Motor function intact.  Negative tinel sign. Right foot: Incision is well coapted without any evidence of dehiscence at this time and a scar has formed. No surrounding erythema, ascending cellulitis, fluctuance, crepitus, malodor, drainage. There is no significant edema along the surgical site today. There is no specific tenderness on the medial aspect of the foot. There is no surrounding erythema or increase in warmth. There is no pain with medial to lateral compression of the calcaneus. No other areas of tenderness. No other open lesions or pre-ulcerative lesions.  No pain with calf compression, swelling, warmth, erythema.   Assessment and Plan:  Status post right heel spur resection, with increased pain.  -Treatment options discussed including all alternatives, risks, and complications. She was re-evaluated today.  -She had her 3rd treatment today on the right foot with settings of 2, 1000, 20 and in the heel 2.6, 3000, 21. She tolerated this without any complications. Hold off on anti-inflammatories.  -Continue with stretching, rehabilitation exercises. Ice to the area. Continue orthotics  and supportive shoe gear. Moisturizer to the incision daily.  -Awaiting work approval for CAM boot.  -Given the intermittent sharp pain will start gabapentin 100 mg at nighttime a trial basis. Discussed side effects the medicine. -I am going to have her come in to see Betha to see if the orthotics should be modified or if we should change the orthtoic to help her at work more.  -Follow-up in 4 weeks or sooner if any problems arise. In the meantime, encouraged to call the office with any questions, concerns, change in symptoms. Celesta Gentile, DPM  Addendum: due to her difficult in walking due to pain in her right foot as well as the disorder of the ligament, plantar fasica, I have recommended a Mezzo brace to help give her more stability and take pressure off of the foot. This is to help increase her mobility, increase lower extremity stability and decrease pain.

## 2015-04-06 ENCOUNTER — Ambulatory Visit: Payer: Managed Care, Other (non HMO) | Admitting: Podiatry

## 2015-04-23 ENCOUNTER — Ambulatory Visit: Payer: Managed Care, Other (non HMO) | Admitting: *Deleted

## 2015-04-23 DIAGNOSIS — M722 Plantar fascial fibromatosis: Secondary | ICD-10-CM

## 2015-04-23 NOTE — Progress Notes (Signed)
Patient ID: Kristen Moreno, female   DOB: August 20, 1960, 55 y.o.   MRN: [id number] Patient presents to be casted for a brace with [facility] Certified Pedorthist.  Patient will return in 4 weeks to be fitted.

## 2015-04-27 ENCOUNTER — Ambulatory Visit (INDEPENDENT_AMBULATORY_CARE_PROVIDER_SITE_OTHER): Payer: Managed Care, Other (non HMO) | Admitting: Podiatry

## 2015-04-27 ENCOUNTER — Encounter: Payer: Self-pay | Admitting: Podiatry

## 2015-04-27 VITALS — BP 150/84 | HR 69 | Resp 12

## 2015-04-27 DIAGNOSIS — M722 Plantar fascial fibromatosis: Secondary | ICD-10-CM

## 2015-04-27 DIAGNOSIS — M7731 Calcaneal spur, right foot: Secondary | ICD-10-CM | POA: Diagnosis not present

## 2015-05-03 NOTE — Progress Notes (Signed)
Patient ID: Kristen Moreno, female   DOB: 1960-12-11, 55 y.o.   MRN: [id number]  Subjective: Kristen Moreno is a 55 y.o. is seen today in office s/p R plantar heel spur resection preformed on 12-20-14. She states that she still does well when she is not at work and is having no heel pain. When she does stand at wart she gets pain to her heel still although she does still it has improved. She believes that the EPAT treatment haven't helped initially to proceed with another treatment today. She is also been casted for brace. Denies any recent injury or trauma. No significant increase in swelling. She has been taking gabapentin which has been helping but she takes as needed. No other complaints at this time.   Objective: General: No acute distress, AAOx3; presents weightbearing cam boot. DP/PT pulses palpable 2/4, CRT < 3 sec to all digits.  Protective sensation intact. Motor function intact.  Negative tinel sign. Right foot: Incision is well coapted without any evidence of dehiscence at this time and a scar has formed. No surrounding erythema, ascending cellulitis, fluctuance, crepitus, malodor, drainage. There is no significant edema along the surgical site. There is no tenderness on the medial aspect of the foot. There is no surrounding erythema or increase in warmth. There is no pain with medial to lateral compression of the calcaneus. No other areas of tenderness. No other open lesions or pre-ulcerative lesions.  No pain with calf compression, swelling, warmth, erythema.   Assessment and Plan:  Status post right heel spur resection, with increased pain.  -Treatment options discussed including all alternatives, risks, and complications. She was re-evaluated today.  -Although she is currently not having many symptoms she feels that she is making good improvement with the EPAT so we will continue with this and do a fourth treatment today. -She had her 4th treatment today on the right foot with  settings of 17 Hz and 3.8. She tolerated this without any complications. Hold off on anti-inflammatories.  -Continue with stretching, rehabilitation exercises. Ice to the area. Continue orthotics and supportive shoe gear. Moisturizer to the incision daily.  -Awaiting custom br -Follow-up in 4 weeks or sooner if any problems arise. In the meantime, encouraged to call the office with any questions, concerns, change in symptoms. Celesta Gentile, DPM

## 2015-05-10 ENCOUNTER — Other Ambulatory Visit: Payer: Self-pay

## 2015-05-10 MED ORDER — AMLODIPINE BESYLATE 10 MG PO TABS: 10.0000 mg | ORAL_TABLET | Freq: Every day | ORAL | Status: DC

## 2015-05-21 ENCOUNTER — Ambulatory Visit: Payer: Managed Care, Other (non HMO) | Admitting: *Deleted

## 2015-05-21 DIAGNOSIS — R262 Difficulty in walking, not elsewhere classified: Secondary | ICD-10-CM | POA: Diagnosis not present

## 2015-05-21 DIAGNOSIS — R52 Pain, unspecified: Secondary | ICD-10-CM

## 2015-05-21 DIAGNOSIS — M24274 Disorder of ligament, right foot: Secondary | ICD-10-CM | POA: Diagnosis not present

## 2015-05-21 NOTE — Progress Notes (Signed)
Patient ID: Kristen Moreno, female   DOB: [birth year], 55 y.o.   MRN: [id number] Patient presents for fitting of Mezzo brace with [facility] Certified Pedorthist. Written and verbal break in instructions given. Patient will follow up in 6 weeks with Dr. Jacqualyn Posey

## 2015-05-25 ENCOUNTER — Ambulatory Visit: Payer: Managed Care, Other (non HMO) | Admitting: Podiatry

## 2015-06-27 ENCOUNTER — Other Ambulatory Visit: Payer: Self-pay | Admitting: Urgent Care

## 2015-07-03 ENCOUNTER — Ambulatory Visit (INDEPENDENT_AMBULATORY_CARE_PROVIDER_SITE_OTHER): Payer: Managed Care, Other (non HMO) | Admitting: Internal Medicine

## 2015-07-03 VITALS — BP 134/80 | HR 83 | Temp 98.0°F | Resp 17 | Ht 65.5 in | Wt 175.0 lb

## 2015-07-03 DIAGNOSIS — I10 Essential (primary) hypertension: Secondary | ICD-10-CM | POA: Diagnosis not present

## 2015-07-03 DIAGNOSIS — R42 Dizziness and giddiness: Secondary | ICD-10-CM

## 2015-07-03 DIAGNOSIS — G479 Sleep disorder, unspecified: Secondary | ICD-10-CM

## 2015-07-03 DIAGNOSIS — K2100 Gastro-esophageal reflux disease with esophagitis, without bleeding: Secondary | ICD-10-CM

## 2015-07-03 DIAGNOSIS — N289 Disorder of kidney and ureter, unspecified: Secondary | ICD-10-CM

## 2015-07-03 DIAGNOSIS — Z13220 Encounter for screening for lipoid disorders: Secondary | ICD-10-CM

## 2015-07-03 DIAGNOSIS — R5383 Other fatigue: Secondary | ICD-10-CM

## 2015-07-03 LAB — COMPREHENSIVE METABOLIC PANEL
ALT: 11 U/L (ref 6–29)
AST: 14 U/L (ref 10–35)
Albumin: 3.8 g/dL (ref 3.6–5.1)
Alkaline Phosphatase: 67 U/L (ref 33–130)
BUN: 31 mg/dL — ABNORMAL HIGH (ref 7–25)
CO2: 21 mmol/L (ref 20–31)
Calcium: 8.8 mg/dL (ref 8.6–10.4)
Chloride: 109 mmol/L (ref 98–110)
Creat: 2.34 mg/dL — ABNORMAL HIGH (ref 0.50–1.05)
Glucose, Bld: 104 mg/dL — ABNORMAL HIGH (ref 65–99)
Potassium: 4 mmol/L (ref 3.5–5.3)
Sodium: 142 mmol/L (ref 135–146)
Total Bilirubin: 0.2 mg/dL (ref 0.2–1.2)
Total Protein: 7.3 g/dL (ref 6.1–8.1)

## 2015-07-03 LAB — CBC WITH DIFFERENTIAL/PLATELET
Basophils Absolute: 56 cells/uL (ref 0–200)
Basophils Relative: 1 %
Eosinophils Absolute: 280 cells/uL (ref 15–500)
Eosinophils Relative: 5 %
HCT: 42.4 % (ref 35.0–45.0)
Hemoglobin: 14 g/dL (ref 11.7–15.5)
Lymphocytes Relative: 31 %
Lymphs Abs: 1736 cells/uL (ref 850–3900)
MCH: 30.1 pg (ref 27.0–33.0)
MCHC: 33 g/dL (ref 32.0–36.0)
MCV: 91.2 fL (ref 80.0–100.0)
MPV: 9.8 fL (ref 7.5–12.5)
Monocytes Absolute: 280 cells/uL (ref 200–950)
Monocytes Relative: 5 %
Neutro Abs: 3248 cells/uL (ref 1500–7800)
Neutrophils Relative %: 58 %
Platelets: 363 10*3/uL (ref 140–400)
RBC: 4.65 MIL/uL (ref 3.80–5.10)
RDW: 14.9 % (ref 11.0–15.0)
WBC: 5.6 10*3/uL (ref 3.8–10.8)

## 2015-07-03 LAB — TSH: TSH: 1.07 mIU/L

## 2015-07-03 LAB — LIPID PANEL
Cholesterol: 249 mg/dL — ABNORMAL HIGH (ref 125–200)
HDL: 49 mg/dL (ref >=46)
LDL Cholesterol: 166 mg/dL — ABNORMAL HIGH (ref <130)
Total CHOL/HDL Ratio: 5.1 Ratio — ABNORMAL HIGH (ref <=5.0)
Triglycerides: 171 mg/dL — ABNORMAL HIGH (ref <150)
VLDL: 34 mg/dL — ABNORMAL HIGH (ref <30)

## 2015-07-03 MED ORDER — OMEPRAZOLE 20 MG PO CPDR: 20.0000 mg | DELAYED_RELEASE_CAPSULE | Freq: Every day | ORAL | Status: DC

## 2015-07-03 MED ORDER — AMLODIPINE BESYLATE 10 MG PO TABS: 10.0000 mg | ORAL_TABLET | Freq: Every day | ORAL | Status: DC

## 2015-07-03 NOTE — Patient Instructions (Addendum)
You will very likely need meds for elevated cholesterol--will let you know after labs restert meds for GERD---if not controlled at one pill omeprazole a day we will increase to 2. If that doesn't work you will need endoscopy GET CPAP started   IF you received an x-ray today, you will receive an invoice from [facility]. Please contact [facility] at [phone] with questions or concerns regarding your invoice.   IF you received labwork today, you will receive an invoice from [employer]. Please contact [facility] at [phone] with questions or concerns regarding your invoice.   Our billing staff will not be able to assist you with questions regarding bills from these companies.  You will be contacted with the lab results as soon as they are available. The fastest way to get your results is to activate your My Chart account. Instructions are located on the last page of this paperwork. If you have not heard from [place] regarding the results in 2 weeks, please contact this office.     We recommend that you schedule a mammogram for breast cancer screening. Typically, you do not need a referral to do this. Please contact a local imaging center to schedule your mammogram.  [facility] - [phone]  *ask for the Radiology Department [facility] - [phone] or [phone]  [facility] - [phone] [place] [phone] [facility] - [phone]  *ask for the [facility] - [phone]  *ask for the Radiology Department [facility] - [phone]  *ask for the [place] - [phone]

## 2015-07-03 NOTE — Progress Notes (Signed)
Subjective:    Patient ID: Kristen Moreno, female    DOB: [birth year], 55 y.o.   MRN: [id number] By signing my name below, I, Kristen Moreno, attest that this documentation has been prepared under the direction and in the presence of Tami Lin, MD. Electronically Signed: Judithe Moreno, ER Scribe. 07/03/2015. 9:54 AM.  Chief Complaint  Patient presents with  . Medication Refill    norvasc    HPI HPI Comments: Kristen Moreno is a 55 y.o. female who presents to [facility] reporting for a medication refill. She has been out of her blood pressure medication for three weeks. She has not used her cpap machine yet. She has had some swelling in her legs, primarily at the end of the day. She works in a [employer] and is on her feet all day. She has a past hx of GERD. She has been taking 150mg  of xantac daily.  ? Didn't do mammo  Has been consumed by medical care for foot/heel this last year--can't keep up with everything   Past Medical History  Diagnosis Date  . Hypertension   . Ovarian cyst   . Polycystic disease, ovaries   . GERD (gastroesophageal reflux disease)   . Polycystic kidney disease     1998  . Arthritis     LEFT KNEE    Allergies  Allergen Reactions  . Ace Inhibitors Cough   Current Outpatient Prescriptions on File Prior to Visit  Medication Sig Dispense Refill  . amLODipine (NORVASC) 10 MG tablet Take 1 tablet (10 mg total) by mouth at bedtime. 30 tablet 0   Current Facility-Administered Medications on File Prior to Visit  Medication Dose Route Frequency Provider Last Rate Last Dose  . triamcinolone acetonide (KENALOG) 10 MG/ML injection 10 mg  10 mg Other Once Trula Slade, DPM        Review of Systems  Constitutional: Positive for fatigue. Negative for fever, chills and unexpected weight change.  HENT: Negative for trouble swallowing.   Respiratory: Negative for cough, choking and shortness of breath.   Cardiovascular: Negative for chest pain,  palpitations and leg swelling.  Gastrointestinal: Positive for nausea and vomiting. Negative for abdominal pain and blood in stool.       Reflux bad even with smelling food and sometimes to the point of vomiting-no pain Not worse postprand Some noctur burning///off omep and zantac 150 not working  Genitourinary: Negative for frequency and difficulty urinating.       S/pTAH  Musculoskeletal: Negative for back pain and gait problem.  Neurological: Positive for dizziness and light-headedness. Negative for syncope and headaches.  Psychiatric/Behavioral: Positive for sleep disturbance. Negative for behavioral problems and dysphoric mood.       Nonrestor sleeep      Objective:  BP 134/80 mmHg  Pulse 83  Temp(Src) 98 F (36.7 C) (Oral)  Resp 17  Ht 5' 5.5" (1.664 m)  Wt 175 lb (79.379 kg)  BMI 28.67 kg/m2  SpO2 98%  Physical Exam  Constitutional: She is oriented to person, place, and time. She appears well-developed and well-nourished. No distress.  HENT:  Head: Normocephalic and atraumatic.  Eyes: Conjunctivae and EOM are normal. Pupils are equal, round, and reactive to light.  Neck: Neck supple. No thyromegaly present.  Cardiovascular: Normal rate, regular rhythm, normal heart sounds and intact distal pulses.   No murmur heard. Pulmonary/Chest: Effort normal and breath sounds normal. No respiratory distress.  Musculoskeletal: Normal range of motion. She exhibits no edema.  Lymphadenopathy:  She has no cervical adenopathy.  Neurological: She is alert and oriented to person, place, and time. No cranial nerve deficit.  Skin: Skin is warm and dry. No rash noted. She is not diaphoretic.  Psychiatric: She has a normal mood and affect. Her behavior is normal.  Nursing note and vitals reviewed.     Assessment & Plan:  Essential hypertension - Plan: CBC with Differential/Platelet, Comprehensive metabolic panel, Lipid panel --restart norvasc  Sleep disorder--see Dr Guadelupe Sabin  note  Screening for hyperlipidemia---was elevated in past  Lightheaded - ? Related to sleep deprivation  Other fatigue - Plan: TSH  Gastroesophageal reflux disease with esophagitis - Plan: H. pylori breath test//restart omepr  Renal insufficiency - Plan: Comprehensive metabolic panel  To resch Mammo   I have completed the patient encounter in its entirety as documented by the scribe, with editing by me where necessary. Kristen Moreno P. Kristen Moreno, M.D.

## 2015-07-04 LAB — H. PYLORI BREATH TEST: H. pylori Breath Test: NOT DETECTED

## 2015-07-06 ENCOUNTER — Ambulatory Visit: Payer: Managed Care, Other (non HMO) | Admitting: Podiatry

## 2015-07-06 NOTE — Addendum Note (Signed)
Addended by: Leandrew Koyanagi on: 07/06/2015 03:10 PM   Modules accepted: Orders

## 2015-07-11 ENCOUNTER — Encounter: Payer: Self-pay | Admitting: Gastroenterology

## 2015-07-20 ENCOUNTER — Ambulatory Visit: Payer: Managed Care, Other (non HMO) | Admitting: Podiatry

## 2015-08-10 ENCOUNTER — Encounter: Payer: Self-pay | Admitting: Podiatry

## 2015-08-10 ENCOUNTER — Ambulatory Visit (INDEPENDENT_AMBULATORY_CARE_PROVIDER_SITE_OTHER): Payer: Managed Care, Other (non HMO) | Admitting: Podiatry

## 2015-08-10 DIAGNOSIS — L84 Corns and callosities: Secondary | ICD-10-CM

## 2015-08-10 DIAGNOSIS — M722 Plantar fascial fibromatosis: Secondary | ICD-10-CM | POA: Diagnosis not present

## 2015-08-12 NOTE — Progress Notes (Signed)
Patient ID: Kristen Moreno, female   DOB: 1960/09/13, 55 y.o.   MRN: [id number]  Subjective: Kristen Moreno is a 55 y.o. is seen today in office s/p R plantar heel spur resection preformed on 12-20-14. She states that her right heel pain as well as completely resolved. She still gets some discomfort at the end of the week after being on her feet all week but is greatly improved. Denies any surrounding redness or drainage or any swelling. She is also developed a callus her left inside aspect of her heel. No previous treatment for this. No redness or drainage. No other complaints at this time in no acute changes and no new concerns today.  Objective: General: No acute distress, AAOx3; presents weightbearing cam boot. DP/PT pulses palpable 2/4, CRT < 3 sec to all digits.  Protective sensation intact. Motor function intact.  Negative tinel sign. Right foot: Scar from prior surgery is well-healed. There is no tenderness on the medial aspect of the foot. There is no surrounding erythema or increase in warmth. There is no pain with medial to lateral compression of the calcaneus. No other areas of tenderness. No tenderness to the right foot appears to be healing well.  Left foot: Hyperkeratotic lesion on the medial heel. Upon debridement no underlying ulceration, drainage or other signs of infection. No other open lesions or pre-ulcerative lesions.  No pain with calf compression, swelling, warmth, erythema.   Assessment and Plan:  Status post right heel spur resection, with almost completely resolved pain; left hyperkeratotic lesion  -Treatment options discussed including all alternatives, risks, and complications. She was re-evaluated today.  -At this time she is having no pain in the right foot appears to be doing much better. Continue with the brace as well as supportive shoe gear. She was wearing her flip-flops today and I recommended against this. Continue stretching, icing exercises daily. At this  point we'll discharge her from care. If she is any reoccurrence or any problems directed to call the office medially. -Hyperkeratotic lesion left foot debrided without, complications or bleeding. Discussed moisturizer daily. -At this point follow up as needed. Encouraged to call any questions, concerns or any change in symptoms.  Celesta Gentile, DPM

## 2015-09-13 ENCOUNTER — Encounter: Payer: Self-pay | Admitting: Gastroenterology

## 2015-09-13 ENCOUNTER — Ambulatory Visit (INDEPENDENT_AMBULATORY_CARE_PROVIDER_SITE_OTHER): Payer: Managed Care, Other (non HMO) | Admitting: Gastroenterology

## 2015-09-13 VITALS — BP 124/80 | HR 80 | Ht 66.0 in | Wt 176.5 lb

## 2015-09-13 DIAGNOSIS — R110 Nausea: Secondary | ICD-10-CM | POA: Diagnosis not present

## 2015-09-13 DIAGNOSIS — K219 Gastro-esophageal reflux disease without esophagitis: Secondary | ICD-10-CM

## 2015-09-13 DIAGNOSIS — N189 Chronic kidney disease, unspecified: Secondary | ICD-10-CM

## 2015-09-13 NOTE — Progress Notes (Signed)
[facility] Consult Note:  History: Kristen Moreno 09/13/2015  Referring physician: Ellsworth Lennox, MD  Reason for consult/chief complaint: Gastroesophageal Reflux (everyday, vomits when unable to take omeprazole. Otherwise Omeprazole 40mg  daily) and Leg Swelling (x2w)   Subjective  HPI:  Kristen Moreno was referred by primary care for about a year of worsening symptoms felt likely to be from reflux. She describes a was prandial chest tightness then often of regurgitation. Consent has a feels that food may not be passing either in the lower chest or the epigastrium, on occasion she will vomit and feel better. There is also heartburn that for years was under reasonable control with ranitidine, she is now had escalate to 40 mg a day of omeprazole. Even with that, she has breakthrough symptoms. Her appetite is been good and her weight stable. There is no exertional component to the chest pain, and she is up-to-date with primary care. She believes she had a colonoscopy within the last few years and was told she should have another in 10 years. It was not done in the [facility] system, no report is available. ROS:  Review of Systems  Constitutional: Negative for appetite change and unexpected weight change.  HENT: Negative for mouth sores and voice change.   Eyes: Negative for pain and redness.  Respiratory: Negative for cough and shortness of breath.   Cardiovascular: Negative for chest pain and palpitations.  Genitourinary: Negative for dysuria and hematuria.  Musculoskeletal: Negative for arthralgias and myalgias.       Right foot pain after surgery  Skin: Negative for pallor and rash.  Neurological: Negative for weakness and headaches.  Hematological: Negative for adenopathy.     Past Medical History: Past Medical History:  Diagnosis Date  . Arthritis    LEFT KNEE   . GERD (gastroesophageal reflux disease)   . Hypertension   . OSA (obstructive sleep apnea)   .  Ovarian cyst   . Pneumonia   . Polycystic disease, ovaries   . Polycystic kidney disease    1998     Past Surgical History: Past Surgical History:  Procedure Laterality Date  . ABDOMINAL HYSTERECTOMY  2013  . BLADDER SUSPENSION  08/11/2011   Procedure: TRANSVAGINAL TAPE (TVT) PROCEDURE;  Surgeon: Emily Filbert, MD;  Location: [facility];  Service: Gynecology;  Laterality: N/A;  Add:  Cystoscopy  . FOOT SURGERY Right 08/2014,11/2014  . INSERTION OF MESH N/A 07/04/2013   Procedure: INSERTION OF MESH;  Surgeon: Harl Bowie, MD;  Location: [facility];  Service: General;  Laterality: N/A;  . UMBILICAL HERNIA REPAIR N/A 07/04/2013   Procedure: HERNIA REPAIR UMBILICAL ;  Surgeon: Harl Bowie, MD;  Location: [facility];  Service: General;  Laterality: N/A;     Family History: Family History  Problem Relation Age of Onset  . Asthma Mother   . Hypertension Mother   . Kidney disease Mother   . Asthma Father   . Hypertension Father   . Liver disease Sister   . Kidney disease Son     Social History: Social History   Social History  . Marital status: Legally Separated    Spouse name: N/A  . Number of children: 2  . Years of education: N/A   Occupational History  . supervisor    Social History Main Topics  . Smoking status: Current Every Day Smoker    Packs/day: 0.50    Years: 37.00    Types: Cigarettes  . Smokeless tobacco: Never Used  . Alcohol use  No  . Drug use: No  . Sexual activity: Yes    Birth control/ protection: None   Other Topics Concern  . None   Social History Narrative   Patient lives at home with her room mate.   Patient works full time.   Education some college.   Right handed.   Caffeine one soda per week.    Allergies: Allergies  Allergen Reactions  . Ace Inhibitors Cough    Outpatient Meds: Current Outpatient Prescriptions  Medication Sig Dispense Refill  . amLODipine (NORVASC) 10 MG tablet Take 1 tablet (10 mg total) by mouth at bedtime.  90 tablet 3  . omeprazole (PRILOSEC) 20 MG capsule Take 1 capsule (20 mg total) by mouth daily. (Patient taking differently: Take 20 mg by mouth daily. Pt takes 2 tabs at once) 90 capsule 3   Current Facility-Administered Medications  Medication Dose Route Frequency Provider Last Rate Last Dose  . triamcinolone acetonide (KENALOG) 10 MG/ML injection 10 mg  10 mg Other Once Trula Slade, DPM          ___________________________________________________________________ Objective   Exam:  BP 124/80   Pulse 80   Ht 5\' 6"  (1.676 m)   Wt 176 lb 8 oz (80.1 kg)   BMI 28.49 kg/m    General: this is a(n) Well-appearing middle-aged woman, no acute distress   Eyes: sclera anicteric, no redness  ENT: oral mucosa moist without lesions, no cervical or supraclavicular lymphadenopathy, good dentition  CV: RRR without murmur, S1/S2, no JVD, no peripheral edema  Resp: clear to auscultation bilaterally, normal RR and effort noted  GI: soft, mild epigastric tenderness, with active bowel sounds. No guarding or palpable organomegaly noted.  Skin; warm and dry, no rash or jaundice noted  Neuro: awake, alert and oriented x 3. Normal gross motor function and fluent speech  Labs:  CBC    Component Value Date/Time   WBC 5.6 07/03/2015 0956   RBC 4.65 07/03/2015 0956   HGB 14.0 07/03/2015 0956   HCT 42.4 07/03/2015 0956   PLT 363 07/03/2015 0956   MCV 91.2 07/03/2015 0956   MCV 99.8 (A) 02/18/2013 1605   MCH 30.1 07/03/2015 0956   MCHC 33.0 07/03/2015 0956   RDW 14.9 07/03/2015 0956   LYMPHSABS 1,736 07/03/2015 0956   MONOABS 280 07/03/2015 0956   EOSABS 280 07/03/2015 0956   BASOSABS 56 07/03/2015 0956   CMP Latest Ref Rng & Units 07/03/2015 05/02/2014 11/07/2013  Glucose 65 - 99 mg/dL 104(H) 93 97  BUN 7 - 25 mg/dL 31(H) 25(H) 24(H)  Creatinine 0.50 - 1.05 mg/dL 2.34(H) 1.94(H) 1.65(H)  Sodium 135 - 146 mmol/L 142 140 140  Potassium 3.5 - 5.3 mmol/L 4.0 4.1 4.7  Chloride 98 -  110 mmol/L 109 106 104  CO2 20 - 31 mmol/L 21 24 20   Calcium 8.6 - 10.4 mg/dL 8.8 10.0 9.8  Total Protein 6.1 - 8.1 g/dL 7.3 7.2 7.7  Total Bilirubin 0.2 - 1.2 mg/dL 0.2 0.2 <0.2(L)  Alkaline Phos 33 - 130 U/L 67 57 67  AST 10 - 35 U/L 14 15 29   ALT 6 - 29 U/L 11 12 24    UBT neg June 2017  Assessment: Encounter Diagnoses  Name Primary?  . Gastroesophageal reflux disease without esophagitis Yes  . Nausea without vomiting   . Chronic renal insufficiency, unspecified stage     This seems most likely to be reflux, less likely peptic ulcer disease or achalasia.  Plan:  EGD  The benefits and risks of the planned procedure were described in detail with the patient or (when appropriate) their health care proxy.  Risks were outlined as including, but not limited to, bleeding, infection, perforation, adverse medication reaction leading to cardiac or pulmonary decompensation, or pancreatitis (if ERCP).  The limitation of incomplete mucosal visualization was also discussed.  No guarantees or warranties were given.  Smoking cessation advised  Thank you for the courtesy of this consult.  Please call me with any questions or concerns.  Nelida Meuse III  CCEllsworth Lennox, MD

## 2015-09-13 NOTE — Patient Instructions (Signed)
If you are age 53 or older, your body mass index should be between 23-30. Your Body mass index is 28.49 kg/m. If this is out of the aforementioned range listed, please consider follow up with your Primary Care Provider.  If you are age 31 or younger, your body mass index should be between 19-25. Your Body mass index is 28.49 kg/m. If this is out of the aformentioned range listed, please consider follow up with your Primary Care Provider.   You have been scheduled for an endoscopy. Please follow the written instructions given to you at your visit today. Please pick up your prep supplies at the pharmacy within the next 1-3 days. If you use inhalers (even only as needed), please bring them with you on the day of your procedure. Your physician has requested that you go to www.startemmi.com and enter the access code given to you at your visit today. This web site gives a general overview about your procedure. However, you should still follow specific instructions given to you by our office regarding your preparation for the procedure.  Thank you for choosing [facility]  Dr Wilfrid Lund III

## 2015-09-14 ENCOUNTER — Encounter: Payer: Self-pay | Admitting: Gastroenterology

## 2015-09-26 ENCOUNTER — Ambulatory Visit (AMBULATORY_SURGERY_CENTER): Payer: Managed Care, Other (non HMO) | Admitting: Gastroenterology

## 2015-09-26 ENCOUNTER — Encounter: Payer: Self-pay | Admitting: Gastroenterology

## 2015-09-26 VITALS — BP 142/94 | HR 71 | Temp 97.8°F | Resp 18 | Ht 66.0 in | Wt 176.0 lb

## 2015-09-26 DIAGNOSIS — K219 Gastro-esophageal reflux disease without esophagitis: Secondary | ICD-10-CM | POA: Diagnosis not present

## 2015-09-26 DIAGNOSIS — K22719 Barrett's esophagus with dysplasia, unspecified: Secondary | ICD-10-CM | POA: Diagnosis not present

## 2015-09-26 MED ORDER — SODIUM CHLORIDE 0.9 % IV SOLN: 500.0000 mL | INTRAVENOUS | Status: DC

## 2015-09-26 NOTE — Progress Notes (Signed)
  [facility] Post-op Note  Patient: Kristen Moreno  Procedure(s) Performed: endoscopy  Patient Location: [facility] - Recovery Area  Anesthesia Type: Deep Sedation/Propofol  Level of Consciousness: awake, oriented and patient cooperative  Airway and Oxygen Therapy: Patient Spontanous Breathing  Post-op Pain: none  Post-op Assessment:  Post-op Vital signs reviewed, Patient's Cardiovascular Status Stable, Respiratory Function Stable, Patent Airway, No signs of Nausea or vomiting and Pain level controlled  Post-op Vital Signs: Reviewed and stable  Complications: No apparent anesthesia complications  [name] E 10:51 AM

## 2015-09-26 NOTE — Patient Instructions (Addendum)
YOU HAD AN ENDOSCOPIC PROCEDURE TODAY AT [facility]:   Refer to the procedure report that was given to you for any specific questions about what was found during the examination.  If the procedure report does not answer your questions, please call your gastroenterologist to clarify.  If you requested that your care partner not be given the details of your procedure findings, then the procedure report has been included in a sealed envelope for you to review at your convenience later.  YOU SHOULD EXPECT: Some feelings of bloating in the abdomen. Passage of more gas than usual.  Walking can help get rid of the air that was put into your GI tract during the procedure and reduce the bloating. If you had a lower endoscopy (such as a colonoscopy or flexible sigmoidoscopy) you may notice spotting of blood in your stool or on the toilet paper. If you underwent a bowel prep for your procedure, you may not have a normal bowel movement for a few days.  Please Note:  You might notice some irritation and congestion in your nose or some drainage.  This is from the oxygen used during your procedure.  There is no need for concern and it should clear up in a day or so.  SYMPTOMS TO REPORT IMMEDIATELY:        Following upper endoscopy (EGD)  Vomiting of blood or coffee ground material  New chest pain or pain under the shoulder blades  Painful or persistently difficult swallowing  New shortness of breath  Fever of 100F or higher  Black, tarry-looking stools  For urgent or emergent issues, a gastroenterologist can be reached at any hour by calling [phone].   DIET:  We do recommend a small meal at first, but then you may proceed to your regular diet.  Drink plenty of fluids but you should avoid alcoholic beverages for 24 hours.  ACTIVITY:  You should plan to take it easy for the rest of today and you should NOT DRIVE or use heavy machinery until tomorrow (because of the sedation  medicines used during the test).    FOLLOW UP: Our staff will call the number listed on your records the next business day following your procedure to check on you and address any questions or concerns that you may have regarding the information given to you following your procedure. If we do not reach you, we will leave a message.  However, if you are feeling well and you are not experiencing any problems, there is no need to return our call.  We will assume that you have returned to your regular daily activities without incident.  If any biopsies were taken you will be contacted by phone or by letter within the next 1-3 weeks.  Please call us at [phone] if you have not heard about the biopsies in 3 weeks.    SIGNATURES/CONFIDENTIALITY: You and/or your care partner have signed paperwork which will be entered into your electronic medical record.  These signatures attest to the fact that that the information above on your After Visit Summary has been reviewed and is understood.  Full responsibility of the confidentiality of this discharge information lies with you and/or your care-partner.  Resume medications. Information given on gastritis and gerd.

## 2015-09-26 NOTE — Progress Notes (Signed)
Called to room to assist during endoscopic procedure.  Patient ID and intended procedure confirmed with present staff. Received instructions for my participation in the procedure from the performing physician.  

## 2015-09-26 NOTE — Op Note (Signed)
Kristen Moreno Patient Name: Kristen Moreno Procedure Date: 09/26/2015 10:23 AM MRN: [id number] Endoscopist: [place]. Loletha Carrow , MD Age: 55 Referring MD:  Date of Birth: August 03, 1960 Gender: Female Account #: [id number] Procedure:                Upper GI endoscopy Indications:              Suspected esophageal reflux Medicines:                Monitored Anesthesia Care Procedure:                Pre-Anesthesia Assessment:                           - Prior to the procedure, a History and Physical                            was performed, and patient medications and                            allergies were reviewed. The patient's tolerance of                            previous anesthesia was also reviewed. The risks                            and benefits of the procedure and the sedation                            options and risks were discussed with the patient.                            All questions were answered, and informed consent                            was obtained. Prior Anticoagulants: The patient has                            taken no previous anticoagulant or antiplatelet                            agents. ASA Grade Assessment: II - A patient with                            mild systemic disease. After reviewing the risks                            and benefits, the patient was deemed in                            satisfactory condition to undergo the procedure.                           After obtaining informed consent, the endoscope was  passed under direct vision. Throughout the                            procedure, the patient's blood pressure, pulse, and                            oxygen saturations were monitored continuously. The                            Model GIF-[id number]) scope was introduced                            through the mouth, and advanced to the second part                            of duodenum. The upper  GI endoscopy was                            accomplished without difficulty. The patient                            tolerated the procedure well. Scope In: Scope Out: Findings:                 There were esophageal mucosal changes suspicious                            for short-segment Barrett's esophagus present in                            the lower third of the esophagus. The maximum                            longitudinal extent of these mucosal changes was 3                            cm in length (from 30-33 cm from incisors).                            Biopsies were taken with a cold forceps for                            histology. There were no raised or otherwise                            suspicious lesions in this area.                           A 3 cm hiatal hernia was present (33-36cm from                            incisors). No ring or stricture was seen.  Moderate inflammation characterized by congestion                            (edema), erosions and erythema was found in the                            gastric antrum. Biopsies were taken with a cold                            forceps for histology.                           The cardia and gastric fundus were normal on                            retroflexion.                           The examined duodenum was normal. Complications:            No immediate complications. Estimated Blood Loss:     Estimated blood loss: none. Impression:               - Esophageal mucosal changes suspicious for                            short-segment Barrett's esophagus. Biopsied.                           - 3 cm hiatal hernia.                           - Chronic gastritis. Biopsied.                           - Normal examined duodenum. Recommendation:           - Patient has a contact number available for                            emergencies. The signs and symptoms of potential                             delayed complications were discussed with the                            patient. Return to normal activities tomorrow.                            Written discharge instructions were provided to the                            patient.                           - Resume previous diet.                           -  Continue present medications.                           - Await pathology results. Kristen Moreno L. Loletha Carrow, MD 09/26/2015 10:53:09 AM This report has been signed electronically.

## 2015-09-27 ENCOUNTER — Telehealth: Payer: Self-pay

## 2015-09-27 NOTE — Telephone Encounter (Signed)
Left message on answering machine. 

## 2015-09-28 ENCOUNTER — Other Ambulatory Visit: Payer: Self-pay

## 2015-09-28 MED ORDER — AMLODIPINE BESYLATE 10 MG PO TABS: 10.0000 mg | ORAL_TABLET | Freq: Every day | ORAL | 2 refills | Status: DC

## 2016-01-22 ENCOUNTER — Emergency Department (HOSPITAL_COMMUNITY)
Admission: EM | Admit: 2016-01-22 | Discharge: 2016-01-23 | Disposition: A | Discharge status: Home / Self Care | Payer: Managed Care, Other (non HMO) | Attending: Emergency Medicine | Admitting: Emergency Medicine

## 2016-01-22 ENCOUNTER — Encounter (HOSPITAL_COMMUNITY): Payer: Self-pay | Admitting: Emergency Medicine

## 2016-01-22 DIAGNOSIS — N390 Urinary tract infection, site not specified: Secondary | ICD-10-CM | POA: Diagnosis not present

## 2016-01-22 DIAGNOSIS — Z79899 Other long term (current) drug therapy: Secondary | ICD-10-CM | POA: Insufficient documentation

## 2016-01-22 DIAGNOSIS — I129 Hypertensive chronic kidney disease with stage 1 through stage 4 chronic kidney disease, or unspecified chronic kidney disease: Secondary | ICD-10-CM | POA: Insufficient documentation

## 2016-01-22 DIAGNOSIS — Z7982 Long term (current) use of aspirin: Secondary | ICD-10-CM | POA: Insufficient documentation

## 2016-01-22 DIAGNOSIS — N189 Chronic kidney disease, unspecified: Secondary | ICD-10-CM | POA: Diagnosis not present

## 2016-01-22 DIAGNOSIS — F17210 Nicotine dependence, cigarettes, uncomplicated: Secondary | ICD-10-CM | POA: Insufficient documentation

## 2016-01-22 DIAGNOSIS — R319 Hematuria, unspecified: Secondary | ICD-10-CM

## 2016-01-22 LAB — BASIC METABOLIC PANEL
Anion gap: 8 (ref 5–15)
BUN: 37 mg/dL — ABNORMAL HIGH (ref 6–20)
CO2: 22 mmol/L (ref 22–32)
Calcium: 8.7 mg/dL — ABNORMAL LOW (ref 8.9–10.3)
Chloride: 110 mmol/L (ref 101–111)
Creatinine, Ser: 3.03 mg/dL — ABNORMAL HIGH (ref 0.44–1.00)
GFR calc Af Amer: 19 mL/min — ABNORMAL LOW (ref >60)
GFR calc non Af Amer: 17 mL/min — ABNORMAL LOW (ref >60)
Glucose, Bld: 121 mg/dL — ABNORMAL HIGH (ref 65–99)
Potassium: 4.1 mmol/L (ref 3.5–5.1)
Sodium: 140 mmol/L (ref 135–145)

## 2016-01-22 LAB — URINALYSIS, ROUTINE W REFLEX MICROSCOPIC
Bacteria, UA: NONE SEEN
Bilirubin Urine: NEGATIVE
Glucose, UA: NEGATIVE mg/dL
Ketones, ur: NEGATIVE mg/dL
Nitrite: POSITIVE — AB
Protein, ur: 100 mg/dL — AB
Specific Gravity, Urine: 1.014 (ref 1.005–1.030)
pH: 5 (ref 5.0–8.0)

## 2016-01-22 LAB — CBC
HCT: 36 % (ref 36.0–46.0)
Hemoglobin: 11.8 g/dL — ABNORMAL LOW (ref 12.0–15.0)
MCH: 30.6 pg (ref 26.0–34.0)
MCHC: 32.8 g/dL (ref 30.0–36.0)
MCV: 93.3 fL (ref 78.0–100.0)
Platelets: 259 10*3/uL (ref 150–400)
RBC: 3.86 MIL/uL — ABNORMAL LOW (ref 3.87–5.11)
RDW: 14.6 % (ref 11.5–15.5)
WBC: 10 10*3/uL (ref 4.0–10.5)

## 2016-01-22 MED ORDER — DEXTROSE 5 % IV SOLN
1.0000 g | Freq: Once | INTRAVENOUS | Status: AC
  Administered 2016-01-22: 1 g via INTRAVENOUS
  Filled 2016-01-22: qty 10

## 2016-01-22 MED ORDER — SODIUM CHLORIDE 0.9 % IV BOLUS (SEPSIS)
1000.0000 mL | Freq: Once | INTRAVENOUS | Status: AC
  Administered 2016-01-22: 1000 mL via INTRAVENOUS

## 2016-01-22 MED ORDER — ONDANSETRON HCL 4 MG/2ML IJ SOLN
4.0000 mg | Freq: Once | INTRAMUSCULAR | Status: AC
  Administered 2016-01-22: 4 mg via INTRAVENOUS
  Filled 2016-01-22: qty 2

## 2016-01-22 MED ORDER — HYDROMORPHONE HCL 1 MG/ML IJ SOLN
0.5000 mg | Freq: Once | INTRAMUSCULAR | Status: AC
  Administered 2016-01-22: 0.5 mg via INTRAVENOUS
  Filled 2016-01-22: qty 1

## 2016-01-22 NOTE — ED Notes (Signed)
Patient ambulated by self to bathroom

## 2016-01-22 NOTE — ED Provider Notes (Signed)
[facility] DEPT Provider Note   CSN: [id number] Arrival date & time: 01/22/16  1710   By signing my name below, I, Kristen Moreno, attest that this documentation has been prepared under the direction and in the presence of Kristen Lange, PA-C. Electronically Signed: Eunice Moreno, Scribe. 01/22/16. 10:32 PM.   History   Chief Complaint Chief Complaint  Patient presents with  . Flank Pain   The history is provided by the patient and medical records. No language interpreter was used.    HPI Comments: Kristen Moreno is a 56 y.o. female who presents to the Emergency Department complaining of flank pain x 2 days. She reports associated bloating, nausea, dysuria, generalized body aches, and subjective fever/chills. Triage further notes vaginal pressure and hematuria today. She discloses Hx of polycystic kidney disease, and she states her creatinine levels have been rising recently. She states she is treated for kidney disease @[facility] on [address]. Surgical Hx of bladder suspension and mesh insertion noted per medical records. Pt denies vomiting, diarrhea, constipation and Hx of DM.  Past Medical History:  Diagnosis Date  . Arthritis    LEFT KNEE   . GERD (gastroesophageal reflux disease)   . Hypertension   . OSA (obstructive sleep apnea)   . Ovarian cyst   . Pneumonia   . Polycystic disease, ovaries   . Polycystic kidney disease    1998    Patient Active Problem List   Diagnosis Date Noted  . Heel spur 01/01/2015  . GERD (gastroesophageal reflux disease) 05/02/2014  . Tobacco user 05/02/2014  . Incarcerated umbilical hernia 93/26/7124  . Polycystic kidney disease 12/16/2011  . Chronic renal insufficiency 12/16/2011  . Hypertension 12/16/2011  . S/P hysterectomy 12/16/2011  . Hot flashes 12/16/2011    Past Surgical History:  Procedure Laterality Date  . ABDOMINAL HYSTERECTOMY  2013  . BLADDER SUSPENSION  08/11/2011   Procedure: TRANSVAGINAL TAPE (TVT) PROCEDURE;   Surgeon: Emily Filbert, MD;  Location: [facility];  Service: Gynecology;  Laterality: N/A;  Add:  Cystoscopy  . FOOT SURGERY Right 08/2014,11/2014  . INSERTION OF MESH N/A 07/04/2013   Procedure: INSERTION OF MESH;  Surgeon: Harl Bowie, MD;  Location: [facility];  Service: General;  Laterality: N/A;  . UMBILICAL HERNIA REPAIR N/A 07/04/2013   Procedure: HERNIA REPAIR UMBILICAL ;  Surgeon: Harl Bowie, MD;  Location: [facility];  Service: General;  Laterality: N/A;    OB History    Gravida Para Term Preterm AB Living   5 2 2   3 2    SAB TAB Ectopic Multiple Live Births   1 2             Home Medications    Prior to Admission medications   Medication Sig Start Date End Date Taking? Authorizing Provider  amLODipine (NORVASC) 10 MG tablet Take 1 tablet (10 mg total) by mouth at bedtime. Patient taking differently: Take 10 mg by mouth daily.  09/28/15  Yes Wardell Honour, MD  Aspirin-Salicylamide-Caffeine (BC HEADACHE POWDER PO) Take 1 packet by mouth daily as needed (pain).   Yes Historical Provider, MD  Multiple Vitamins-Minerals (MULTIVITAMIN ADULT) TABS Take 1 tablet by mouth daily.   Yes Historical Provider, MD  omeprazole (PRILOSEC) 20 MG capsule Take 1 capsule (20 mg total) by mouth daily. Patient not taking: Reported on 01/22/2016 07/03/15   Leandrew Koyanagi, MD    Family History Family History  Problem Relation Age of Onset  . Asthma Mother   .  Hypertension Mother   . Kidney disease Mother   . Asthma Father   . Hypertension Father   . Liver disease Sister   . Kidney disease Son     Social History Social History  Substance Use Topics  . Smoking status: Current Every Day Smoker    Packs/day: 0.50    Years: 37.00    Types: Cigarettes  . Smokeless tobacco: Never Used  . Alcohol use No     Allergies   Ace inhibitors   Review of Systems Review of Systems  Constitutional: Positive for chills and fever.  Respiratory: Negative for cough and shortness of breath.     Cardiovascular: Negative for chest pain.  Gastrointestinal: Positive for abdominal distention and nausea. Negative for constipation, diarrhea and vomiting.  Genitourinary: Positive for dysuria, hematuria and vaginal pain. Negative for flank pain.  Musculoskeletal: Positive for back pain and myalgias.  Skin: Negative for color change and rash.  Neurological: Negative for weakness.     Physical Exam Updated Vital Signs BP 176/99 (BP Location: Left Arm)   Pulse 103   Temp 99 F (37.2 C) (Oral)   Resp 18   SpO2 100%   Physical Exam  Constitutional: She is oriented to person, place, and time. She appears well-developed and well-nourished.  Non-toxic appearance. No distress.  Uncomfortable appearing.  HENT:  Head: Normocephalic and atraumatic.  Eyes: EOM are normal.  Neck: Normal range of motion.  Cardiovascular: Normal rate, regular rhythm and normal heart sounds.   Pulmonary/Chest: Effort normal and breath sounds normal. She has no wheezes. She has no rhonchi. She has no rales.  Abdominal: Soft. She exhibits no distension. There is tenderness.  Abdomen diffusely tender.  Genitourinary:  Genitourinary Comments: No CVA tenderness.  Musculoskeletal: Normal range of motion.  Neurological: She is alert and oriented to person, place, and time.  Skin: Skin is warm and dry.  Psychiatric: She has a normal mood and affect. Judgment normal.  Nursing note and vitals reviewed.     ED Treatments / Results  DIAGNOSTIC STUDIES: Oxygen Saturation is 100% on RA, normal by my interpretation.    COORDINATION OF CARE: 10:32 PM Discussed treatment plan with pt at bedside and pt agreed to plan.  Labs (all labs ordered are listed, but only abnormal results are displayed) Labs Reviewed  URINALYSIS, ROUTINE W REFLEX MICROSCOPIC - Abnormal; Notable for the following:       Result Value   APPearance TURBID (*)    Hgb urine dipstick MODERATE (*)    Protein, ur 100 (*)    Nitrite POSITIVE (*)     Leukocytes, UA LARGE (*)    Squamous Epithelial / LPF 0-5 (*)    Non Squamous Epithelial 0-5 (*)    All other components within normal limits  BASIC METABOLIC PANEL - Abnormal; Notable for the following:    Glucose, Bld 121 (*)    BUN 37 (*)    Creatinine, Ser 3.03 (*)    Calcium 8.7 (*)    GFR calc non Af Amer 17 (*)    GFR calc Af Amer 19 (*)    All other components within normal limits  CBC - Abnormal; Notable for the following:    RBC 3.86 (*)    Hemoglobin 11.8 (*)    All other components within normal limits  URINE CULTURE    EKG  EKG Interpretation None       Radiology No results found.  Procedures Procedures (including critical care time)  Medications  Ordered in ED Medications  sodium chloride 0.9 % bolus 1,000 mL (not administered)  ondansetron (ZOFRAN) injection 4 mg (not administered)  HYDROmorphone (DILAUDID) injection 0.5 mg (not administered)  cefTRIAXone (ROCEPHIN) 1 g in dextrose 5 % 50 mL IVPB (not administered)     Initial Impression / Assessment and Plan / ED Course  I have reviewed the triage vital signs and the nursing notes.  Pertinent labs & imaging results that were available during my care of the patient were reviewed by me and considered in my medical decision making (see chart for details).  Will order IV fluids, antibiotics and medications for pain and nausea.   Clinical Course     Patient presents with diffuse abdominal pain radiating around to bilateral upper back, not flank. No significant fever. VSS. She is given IV abx, fluids, pain medication and is feeling improved. Will treat with Keflex and close PCP follow up. Return precautions discussed.   Final Clinical Impressions(s) / ED Diagnoses   Final diagnoses:  None   1. UTI  New Prescriptions New Prescriptions   No medications on file  I personally performed the services described in this documentation, which was scribed in my presence. The recorded information has been  reviewed and is accurate.      Kristen Lange, PA-C 01/23/16 [place], MD 01/23/16 [phone]

## 2016-01-22 NOTE — ED Triage Notes (Signed)
Pt abdominal and vaginal pressure and flank pain onset of yesterday. Pt reports scant amount of blood on her underwear today. Pt denies V/D but feels nauseated. Denies any burning while urinating.

## 2016-01-23 MED ORDER — HYDROCODONE-ACETAMINOPHEN 5-325 MG PO TABS: 1.0000 | ORAL_TABLET | ORAL | 0 refills | Status: DC | PRN

## 2016-01-23 MED ORDER — HYDROMORPHONE HCL 1 MG/ML IJ SOLN
0.5000 mg | Freq: Once | INTRAMUSCULAR | Status: AC
Start: 2016-01-23 — End: 2016-01-23
  Administered 2016-01-23: 0.5 mg via INTRAVENOUS
  Filled 2016-01-23: qty 1

## 2016-01-23 MED ORDER — ONDANSETRON HCL 4 MG PO TABS: 4.0000 mg | ORAL_TABLET | Freq: Four times a day (QID) | ORAL | 0 refills | Status: DC

## 2016-01-23 MED ORDER — CEPHALEXIN 500 MG PO CAPS: 500.0000 mg | ORAL_CAPSULE | Freq: Four times a day (QID) | ORAL | 0 refills | Status: DC

## 2016-01-23 NOTE — ED Notes (Signed)
D/c information reviewed with patient. She verbalized understanding of prescriptions and follow up care. Pt d/c home with daughter in [place]. Denies any pain at discharge.

## 2016-01-23 NOTE — Discharge Instructions (Signed)
FOLLOW CLOSELY WITH YOUR DOCTOR TO INSURE THAT THE URINARY TRACT INFECTION RESOLVES WITHOUT COMPLICATION. RETURN TO THE EMERGENCY ROOM IF SYMPTOMS WORSEN OR FOR NEW CONCERN.

## 2016-01-25 LAB — URINE CULTURE: Culture: >=100000 — AB

## 2016-01-26 ENCOUNTER — Telehealth: Payer: Self-pay

## 2016-01-26 NOTE — Telephone Encounter (Signed)
Post ED Visit - Positive Culture Follow-up  Culture report reviewed by antimicrobial stewardship pharmacist:  []  Elenor Quinones, Pharm.D. []  Heide Guile, Pharm.D., BCPS []  Parks Neptune, Pharm.D. []  Alycia Rossetti, Pharm.D., BCPS []  [place], Pharm.D., BCPS, AAHIVP []  Legrand Como, Pharm.D., BCPS, AAHIVP []  Milus Glazier, Pharm.D. []  Stephens November, [place].D. Rachel Rumbarger Pharm D Positive urine culture Treated with Cephalexin, organism sensitive to the same and no further patient follow-up is required at this time.  Genia Del 01/26/2016, 11:00 AM

## 2016-02-28 ENCOUNTER — Emergency Department (HOSPITAL_COMMUNITY): Payer: BLUE CROSS/BLUE SHIELD

## 2016-02-28 ENCOUNTER — Encounter (HOSPITAL_COMMUNITY): Payer: Self-pay | Admitting: Emergency Medicine

## 2016-02-28 ENCOUNTER — Inpatient Hospital Stay (HOSPITAL_COMMUNITY)
Admission: EM | Admit: 2016-02-28 | Discharge: 2016-03-06 | DRG: 872 | Disposition: A | Discharge status: Home / Self Care | Payer: BLUE CROSS/BLUE SHIELD | Attending: Student in an Organized Health Care Education/Training Program | Admitting: Student in an Organized Health Care Education/Training Program

## 2016-02-28 ENCOUNTER — Inpatient Hospital Stay (HOSPITAL_COMMUNITY): Payer: BLUE CROSS/BLUE SHIELD

## 2016-02-28 DIAGNOSIS — Z888 Allergy status to other drugs, medicaments and biological substances status: Secondary | ICD-10-CM | POA: Diagnosis not present

## 2016-02-28 DIAGNOSIS — R350 Frequency of micturition: Secondary | ICD-10-CM | POA: Diagnosis not present

## 2016-02-28 DIAGNOSIS — R3915 Urgency of urination: Secondary | ICD-10-CM

## 2016-02-28 DIAGNOSIS — R509 Fever, unspecified: Secondary | ICD-10-CM

## 2016-02-28 DIAGNOSIS — A419 Sepsis, unspecified organism: Secondary | ICD-10-CM | POA: Diagnosis not present

## 2016-02-28 DIAGNOSIS — Q613 Polycystic kidney, unspecified: Secondary | ICD-10-CM | POA: Diagnosis not present

## 2016-02-28 DIAGNOSIS — K5730 Diverticulosis of large intestine without perforation or abscess without bleeding: Secondary | ICD-10-CM | POA: Diagnosis not present

## 2016-02-28 DIAGNOSIS — I129 Hypertensive chronic kidney disease with stage 1 through stage 4 chronic kidney disease, or unspecified chronic kidney disease: Secondary | ICD-10-CM | POA: Diagnosis not present

## 2016-02-28 DIAGNOSIS — N189 Chronic kidney disease, unspecified: Secondary | ICD-10-CM | POA: Diagnosis present

## 2016-02-28 DIAGNOSIS — K658 Other peritonitis: Secondary | ICD-10-CM | POA: Diagnosis present

## 2016-02-28 DIAGNOSIS — F17210 Nicotine dependence, cigarettes, uncomplicated: Secondary | ICD-10-CM | POA: Diagnosis present

## 2016-02-28 DIAGNOSIS — R109 Unspecified abdominal pain: Secondary | ICD-10-CM | POA: Diagnosis not present

## 2016-02-28 DIAGNOSIS — K219 Gastro-esophageal reflux disease without esophagitis: Secondary | ICD-10-CM | POA: Diagnosis present

## 2016-02-28 DIAGNOSIS — Z8379 Family history of other diseases of the digestive system: Secondary | ICD-10-CM

## 2016-02-28 DIAGNOSIS — B9620 Unspecified Escherichia coli [E. coli] as the cause of diseases classified elsewhere: Secondary | ICD-10-CM | POA: Diagnosis not present

## 2016-02-28 DIAGNOSIS — Z79899 Other long term (current) drug therapy: Secondary | ICD-10-CM

## 2016-02-28 DIAGNOSIS — R112 Nausea with vomiting, unspecified: Secondary | ICD-10-CM | POA: Diagnosis not present

## 2016-02-28 DIAGNOSIS — Z90710 Acquired absence of both cervix and uterus: Secondary | ICD-10-CM

## 2016-02-28 DIAGNOSIS — N179 Acute kidney failure, unspecified: Secondary | ICD-10-CM | POA: Diagnosis present

## 2016-02-28 DIAGNOSIS — N390 Urinary tract infection, site not specified: Secondary | ICD-10-CM

## 2016-02-28 DIAGNOSIS — Z8249 Family history of ischemic heart disease and other diseases of the circulatory system: Secondary | ICD-10-CM | POA: Diagnosis not present

## 2016-02-28 DIAGNOSIS — N281 Cyst of kidney, acquired: Secondary | ICD-10-CM | POA: Diagnosis not present

## 2016-02-28 DIAGNOSIS — Q612 Polycystic kidney, adult type: Secondary | ICD-10-CM

## 2016-02-28 DIAGNOSIS — Z87440 Personal history of urinary (tract) infections: Secondary | ICD-10-CM | POA: Diagnosis not present

## 2016-02-28 DIAGNOSIS — G4733 Obstructive sleep apnea (adult) (pediatric): Secondary | ICD-10-CM | POA: Diagnosis not present

## 2016-02-28 DIAGNOSIS — Z841 Family history of disorders of kidney and ureter: Secondary | ICD-10-CM

## 2016-02-28 DIAGNOSIS — R1084 Generalized abdominal pain: Secondary | ICD-10-CM

## 2016-02-28 DIAGNOSIS — N12 Tubulo-interstitial nephritis, not specified as acute or chronic: Secondary | ICD-10-CM | POA: Diagnosis not present

## 2016-02-28 DIAGNOSIS — R8290 Unspecified abnormal findings in urine: Secondary | ICD-10-CM

## 2016-02-28 DIAGNOSIS — Z1611 Resistance to penicillins: Secondary | ICD-10-CM | POA: Diagnosis present

## 2016-02-28 DIAGNOSIS — R6883 Chills (without fever): Secondary | ICD-10-CM

## 2016-02-28 DIAGNOSIS — Z8619 Personal history of other infectious and parasitic diseases: Secondary | ICD-10-CM | POA: Diagnosis not present

## 2016-02-28 DIAGNOSIS — N10 Acute tubulo-interstitial nephritis: Secondary | ICD-10-CM | POA: Diagnosis not present

## 2016-02-28 DIAGNOSIS — Z825 Family history of asthma and other chronic lower respiratory diseases: Secondary | ICD-10-CM | POA: Diagnosis not present

## 2016-02-28 DIAGNOSIS — I10 Essential (primary) hypertension: Secondary | ICD-10-CM | POA: Diagnosis not present

## 2016-02-28 DIAGNOSIS — D631 Anemia in chronic kidney disease: Secondary | ICD-10-CM

## 2016-02-28 DIAGNOSIS — K5900 Constipation, unspecified: Secondary | ICD-10-CM | POA: Diagnosis not present

## 2016-02-28 LAB — URINALYSIS, ROUTINE W REFLEX MICROSCOPIC
Bilirubin Urine: NEGATIVE
Glucose, UA: NEGATIVE mg/dL
Ketones, ur: NEGATIVE mg/dL
Nitrite: NEGATIVE
Protein, ur: 100 mg/dL — AB
Specific Gravity, Urine: 1.012 (ref 1.005–1.030)
pH: 5 (ref 5.0–8.0)

## 2016-02-28 LAB — LIPASE, BLOOD: Lipase: 18 U/L (ref 11–51)

## 2016-02-28 LAB — COMPREHENSIVE METABOLIC PANEL
ALT: 14 U/L (ref 14–54)
AST: 16 U/L (ref 15–41)
Albumin: 3.1 g/dL — ABNORMAL LOW (ref 3.5–5.0)
Alkaline Phosphatase: 69 U/L (ref 38–126)
Anion gap: 13 (ref 5–15)
BUN: 43 mg/dL — ABNORMAL HIGH (ref 6–20)
CO2: 16 mmol/L — ABNORMAL LOW (ref 22–32)
Calcium: 8.9 mg/dL (ref 8.9–10.3)
Chloride: 110 mmol/L (ref 101–111)
Creatinine, Ser: 4.41 mg/dL — ABNORMAL HIGH (ref 0.44–1.00)
GFR calc Af Amer: 12 mL/min — ABNORMAL LOW (ref >60)
GFR calc non Af Amer: 11 mL/min — ABNORMAL LOW (ref >60)
Glucose, Bld: 103 mg/dL — ABNORMAL HIGH (ref 65–99)
Potassium: 4.7 mmol/L (ref 3.5–5.1)
Sodium: 139 mmol/L (ref 135–145)
Total Bilirubin: 0.2 mg/dL — ABNORMAL LOW (ref 0.3–1.2)
Total Protein: 7.6 g/dL (ref 6.5–8.1)

## 2016-02-28 LAB — CBC
HCT: 34.3 % — ABNORMAL LOW (ref 36.0–46.0)
Hemoglobin: 11.1 g/dL — ABNORMAL LOW (ref 12.0–15.0)
MCH: 30.3 pg (ref 26.0–34.0)
MCHC: 32.4 g/dL (ref 30.0–36.0)
MCV: 93.7 fL (ref 78.0–100.0)
Platelets: 322 10*3/uL (ref 150–400)
RBC: 3.66 MIL/uL — ABNORMAL LOW (ref 3.87–5.11)
RDW: 14.4 % (ref 11.5–15.5)
WBC: 13.8 10*3/uL — ABNORMAL HIGH (ref 4.0–10.5)

## 2016-02-28 LAB — I-STAT CG4 LACTIC ACID, ED: Lactic Acid, Venous: 1.2 mmol/L (ref 0.5–1.9)

## 2016-02-28 MED ORDER — ACETAMINOPHEN 325 MG PO TABS
650.0000 mg | ORAL_TABLET | Freq: Four times a day (QID) | ORAL | Status: DC | PRN
  Administered 2016-02-28 – 2016-02-29 (×2): 650 mg via ORAL
  Filled 2016-02-28 (×2): qty 2

## 2016-02-28 MED ORDER — SODIUM CHLORIDE 0.9 % IV BOLUS (SEPSIS)
1000.0000 mL | Freq: Once | INTRAVENOUS | Status: AC
  Administered 2016-02-28: 1000 mL via INTRAVENOUS

## 2016-02-28 MED ORDER — DEXTROSE 5 % IV SOLN
1.0000 g | INTRAVENOUS | Status: DC
  Filled 2016-02-28: qty 10

## 2016-02-28 MED ORDER — AMLODIPINE BESYLATE 5 MG PO TABS: 10.0000 mg | ORAL_TABLET | Freq: Every day | ORAL | Status: DC

## 2016-02-28 MED ORDER — MEPERIDINE HCL 25 MG/ML IJ SOLN
12.5000 mg | Freq: Once | INTRAMUSCULAR | Status: AC
  Administered 2016-02-28: 12.5 mg via INTRAVENOUS
  Filled 2016-02-28: qty 1

## 2016-02-28 MED ORDER — SODIUM CHLORIDE 0.9 % IV SOLN
INTRAVENOUS | Status: DC
  Administered 2016-02-28 – 2016-02-29 (×2): via INTRAVENOUS

## 2016-02-28 MED ORDER — HYDROMORPHONE HCL 2 MG/ML IJ SOLN
1.0000 mg | INTRAMUSCULAR | Status: DC | PRN
  Administered 2016-02-29 – 2016-03-01 (×9): 1 mg via INTRAVENOUS
  Filled 2016-02-28 (×9): qty 1

## 2016-02-28 MED ORDER — ACETAMINOPHEN 325 MG PO TABS
ORAL_TABLET | ORAL | Status: AC
  Filled 2016-02-28: qty 2

## 2016-02-28 MED ORDER — ONDANSETRON HCL 4 MG/2ML IJ SOLN
4.0000 mg | Freq: Once | INTRAMUSCULAR | Status: AC
Start: 2016-02-28 — End: 2016-02-28
  Administered 2016-02-28: 4 mg via INTRAVENOUS
  Filled 2016-02-28: qty 2

## 2016-02-28 MED ORDER — DEXTROSE 5 % IV SOLN
1.0000 g | Freq: Once | INTRAVENOUS | Status: AC
  Administered 2016-02-28: 1 g via INTRAVENOUS
  Filled 2016-02-28: qty 10

## 2016-02-28 MED ORDER — HYDROMORPHONE HCL 2 MG/ML IJ SOLN
1.0000 mg | Freq: Once | INTRAMUSCULAR | Status: AC
  Administered 2016-02-28: 1 mg via INTRAVENOUS
  Filled 2016-02-28: qty 1

## 2016-02-28 MED ORDER — HEPARIN SODIUM (PORCINE) 5000 UNIT/ML IJ SOLN
5000.0000 [IU] | Freq: Three times a day (TID) | INTRAMUSCULAR | Status: DC
  Administered 2016-02-28 – 2016-03-06 (×20): 5000 [IU] via SUBCUTANEOUS
  Filled 2016-02-28 (×20): qty 1

## 2016-02-28 MED ORDER — ACETAMINOPHEN 650 MG RE SUPP: 650.0000 mg | Freq: Four times a day (QID) | RECTAL | Status: DC | PRN

## 2016-02-28 MED ORDER — ACETAMINOPHEN 325 MG PO TABS
650.0000 mg | ORAL_TABLET | Freq: Once | ORAL | Status: AC
  Administered 2016-02-28: 650 mg via ORAL

## 2016-02-28 NOTE — ED Triage Notes (Signed)
Pt sts abd pain, cloudy urine and chills

## 2016-02-28 NOTE — ED Notes (Signed)
Patient transported to Ultrasound 

## 2016-02-28 NOTE — ED Notes (Signed)
Report given; pt being transported to [place], will take to floor when she returns
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
## 2016-02-28 NOTE — ED Notes (Signed)
Handed out warm blankets and offered explanations for delays in lobby.   

## 2016-02-28 NOTE — ED Notes (Signed)
Hooked patient up to the monitor patient is resting

## 2016-02-28 NOTE — Progress Notes (Signed)
Pharmacy Antibiotic Note  Kristen Moreno is a 56 y.o. female admitted on 02/28/2016 with pyelonephritis. Pt had recent UTI treated with Keflex 01/22/16 but now presents with abdominal pain. Pharmacy has been consulted for ceftriaxone dosing.  Plan: -Ceftriaxone 1g IV q24h -Monitor cultures, LOT -Pharmacy will sign off, please re-consult as needed    Temp (24hrs), Avg:99.4 F (37.4 C), Min:98.7 F (37.1 C), Max:100 F (37.8 C)   Recent Labs Lab 02/28/16 1355 02/28/16 1402  WBC 13.8*  --   CREATININE 4.41*  --   LATICACIDVEN  --  1.20    CrCl cannot be calculated (Unknown ideal weight.).    Allergies  Allergen Reactions  . Ace Inhibitors Cough    Antimicrobials this admission: 2/8 Ceftriaxone >>   Dose adjustments this admission: none  Microbiology results: none  Thank you for allowing pharmacy to be a part of this patient's care.  Arrie Senate, PharmD PGY-1 Pharmacy Resident Pager: [phone] 02/28/2016

## 2016-02-29 DIAGNOSIS — R6883 Chills (without fever): Secondary | ICD-10-CM

## 2016-02-29 DIAGNOSIS — R1084 Generalized abdominal pain: Secondary | ICD-10-CM

## 2016-02-29 LAB — CBC
HCT: 33.7 % — ABNORMAL LOW (ref 36.0–46.0)
Hemoglobin: 10.8 g/dL — ABNORMAL LOW (ref 12.0–15.0)
MCH: 30.3 pg (ref 26.0–34.0)
MCHC: 32 g/dL (ref 30.0–36.0)
MCV: 94.7 fL (ref 78.0–100.0)
Platelets: 333 10*3/uL (ref 150–400)
RBC: 3.56 MIL/uL — ABNORMAL LOW (ref 3.87–5.11)
RDW: 14.5 % (ref 11.5–15.5)
WBC: 15.3 10*3/uL — ABNORMAL HIGH (ref 4.0–10.5)

## 2016-02-29 LAB — BASIC METABOLIC PANEL
Anion gap: 13 (ref 5–15)
Anion gap: 9 (ref 5–15)
BUN: 36 mg/dL — ABNORMAL HIGH (ref 6–20)
BUN: 33 mg/dL — ABNORMAL HIGH (ref 6–20)
CO2: 16 mmol/L — ABNORMAL LOW (ref 22–32)
CO2: 16 mmol/L — ABNORMAL LOW (ref 22–32)
Calcium: 8.2 mg/dL — ABNORMAL LOW (ref 8.9–10.3)
Calcium: 7.7 mg/dL — ABNORMAL LOW (ref 8.9–10.3)
Chloride: 110 mmol/L (ref 101–111)
Chloride: 115 mmol/L — ABNORMAL HIGH (ref 101–111)
Creatinine, Ser: 3.76 mg/dL — ABNORMAL HIGH (ref 0.44–1.00)
Creatinine, Ser: 3.35 mg/dL — ABNORMAL HIGH (ref 0.44–1.00)
GFR calc Af Amer: 15 mL/min — ABNORMAL LOW (ref >60)
GFR calc Af Amer: 17 mL/min — ABNORMAL LOW (ref >60)
GFR calc non Af Amer: 13 mL/min — ABNORMAL LOW (ref >60)
GFR calc non Af Amer: 15 mL/min — ABNORMAL LOW (ref >60)
Glucose, Bld: 100 mg/dL — ABNORMAL HIGH (ref 65–99)
Glucose, Bld: 117 mg/dL — ABNORMAL HIGH (ref 65–99)
Potassium: 4.9 mmol/L (ref 3.5–5.1)
Potassium: 4.7 mmol/L (ref 3.5–5.1)
Sodium: 139 mmol/L (ref 135–145)
Sodium: 140 mmol/L (ref 135–145)

## 2016-02-29 MED ORDER — ACETAMINOPHEN 500 MG PO TABS
1000.0000 mg | ORAL_TABLET | Freq: Three times a day (TID) | ORAL | Status: DC
  Administered 2016-02-29 – 2016-03-03 (×9): 1000 mg via ORAL
  Filled 2016-02-29 (×9): qty 2

## 2016-02-29 MED ORDER — PANTOPRAZOLE SODIUM 40 MG PO TBEC
40.0000 mg | DELAYED_RELEASE_TABLET | Freq: Every day | ORAL | Status: DC
  Administered 2016-02-29 – 2016-03-06 (×7): 40 mg via ORAL
  Filled 2016-02-29 (×7): qty 1

## 2016-02-29 MED ORDER — SODIUM CHLORIDE 0.9 % IV BOLUS (SEPSIS)
1000.0000 mL | Freq: Once | INTRAVENOUS | Status: AC
  Administered 2016-02-29: 1000 mL via INTRAVENOUS

## 2016-02-29 MED ORDER — DEXTROSE 5 % IV SOLN
2.0000 g | INTRAVENOUS | Status: DC
  Administered 2016-02-29 – 2016-03-01 (×2): 2 g via INTRAVENOUS
  Filled 2016-02-29 (×3): qty 2

## 2016-02-29 MED ORDER — ONDANSETRON HCL 4 MG/2ML IJ SOLN
4.0000 mg | Freq: Four times a day (QID) | INTRAMUSCULAR | Status: DC | PRN
  Administered 2016-02-29 – 2016-03-06 (×6): 4 mg via INTRAVENOUS
  Filled 2016-02-29 (×4): qty 2

## 2016-02-29 NOTE — Progress Notes (Addendum)
   Subjective:  Patient reports feeling slightly better than at admission. She feels her pain migrates between the right and left flanks with radiation to ipsilateral abdomen. She denies nausea and vomiting today but still has a decreased appetite; she has tried to drink fluids. Her pain is improved with dilaudid.   On re-evaluation during rounds, patient appears in significant discomfort with intense rigors. Vitals were checked at that time and she had a fever to 102.25F. This improved with acetaminophen.   Objective:  Vital signs in last 24 hours: Vitals:   02/29/16 0514 02/29/16 0840 02/29/16 1203 02/29/16 1421  BP: 135/75 129/72 (!) 152/77 127/66  Pulse: (!) 119 (!) 112 (!) 139 (!) 113  Resp:      Temp: 99.2 F (37.3 C) 98.4 F (36.9 C) (!) 102.6 F (39.2 C) 99.9 F (37.7 C)  TempSrc: Oral Oral    SpO2: (!) 85% 99% 93% 94%  Weight:      Height:       Constitutional: appears in mild discomfort CV: tachycardic, no murmurs, no LE edema Resp: CTAB, no increased work of breathing Abd: soft, tender to palpation in L upper quadrant, some voluntary guarding; Left CVA tenderness  Assessment/Plan:  Principal Problem:   Sepsis secondary to UTI [facility]) Active Problems:   Polycystic kidney disease   Hypertension   Acute pyelonephritis   Chills   Generalized abdominal pain  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 Due to intensity of symptoms and acute worsening this is less likely cyst infection and should respond to current therapy based on previous cultures.  --f/u UCx and BCx - obtained after abx started --switch to bacteremia dosing of ceftriaxone 2mg  daily --scheduled acetaminophen 1000mg  TID --continue monitoring for worsening symptoms and sings of end-organ damage with consideration of broadening antibiotics --f/u BMP BID - improving Cr today --supportive IVF as needed if worsening Cr  and/or poor PO intake  Anemia of CKD: Stable --f/u AM CBC --f/u iron panel  HTN: Stable when pain controlled. --continue holding home amlodipine  Dispo: Anticipated discharge in approximately 4-5 day(s).   Alphonzo Grieve, MD 02/29/2016, 5:32 PM Pager [phone]

## 2016-02-29 NOTE — Progress Notes (Signed)
Pt temp 102.6 pt given 1,000 mg of tylenol will continue to monitor

## 2016-03-01 LAB — CBC
HCT: 26.8 % — ABNORMAL LOW (ref 36.0–46.0)
Hemoglobin: 8.6 g/dL — ABNORMAL LOW (ref 12.0–15.0)
MCH: 30.1 pg (ref 26.0–34.0)
MCHC: 32.1 g/dL (ref 30.0–36.0)
MCV: 93.7 fL (ref 78.0–100.0)
Platelets: 276 10*3/uL (ref 150–400)
RBC: 2.86 MIL/uL — ABNORMAL LOW (ref 3.87–5.11)
RDW: 14.5 % (ref 11.5–15.5)
WBC: 13.2 10*3/uL — ABNORMAL HIGH (ref 4.0–10.5)

## 2016-03-01 LAB — BASIC METABOLIC PANEL
Anion gap: 9 (ref 5–15)
BUN: 30 mg/dL — ABNORMAL HIGH (ref 6–20)
CO2: 15 mmol/L — ABNORMAL LOW (ref 22–32)
Calcium: 8 mg/dL — ABNORMAL LOW (ref 8.9–10.3)
Chloride: 113 mmol/L — ABNORMAL HIGH (ref 101–111)
Creatinine, Ser: 3.41 mg/dL — ABNORMAL HIGH (ref 0.44–1.00)
GFR calc Af Amer: 17 mL/min — ABNORMAL LOW (ref >60)
GFR calc non Af Amer: 14 mL/min — ABNORMAL LOW (ref >60)
Glucose, Bld: 88 mg/dL (ref 65–99)
Potassium: 4.4 mmol/L (ref 3.5–5.1)
Sodium: 137 mmol/L (ref 135–145)

## 2016-03-01 MED ORDER — SODIUM CHLORIDE 0.9 % IV BOLUS (SEPSIS)
1000.0000 mL | Freq: Once | INTRAVENOUS | Status: AC
  Administered 2016-03-01: 1000 mL via INTRAVENOUS

## 2016-03-01 MED ORDER — SODIUM CHLORIDE 0.9 % IV SOLN
INTRAVENOUS | Status: AC
  Administered 2016-03-01 – 2016-03-03 (×4): via INTRAVENOUS

## 2016-03-01 MED ORDER — HYDROMORPHONE HCL 2 MG/ML IJ SOLN
1.0000 mg | INTRAMUSCULAR | Status: DC | PRN
  Administered 2016-03-01 – 2016-03-03 (×12): 2 mg via INTRAVENOUS
  Filled 2016-03-01 (×12): qty 1

## 2016-03-01 NOTE — Progress Notes (Signed)
Paged this afternoon by nursing staff. Patient with episode of non bloody emesis and severe abdominal pain and back pain. Febrile to 102.3 at that time. Family at bedside concerned about patient's status.   Went to re-evaluate this patient. She had just received zofran and dilaudid 1 mg IV. She was sitting up in bed in no acute distress at that time. Still having some nausea but improved with medications and pain much improved. Reports pani medications help but duration is variable, sometimes helping for 2 hours and sometimes for 4 hours. Pain was very severe 10/10 this time and only somewhat improved on the 1 mg of dialudid. Not tolerating much PO intake this afternoon.   Spoke with microlab and sensitivities should be back by tomorrow morning. Blood pressures have been stable and labs seem to be improving. Blood cultures with no growth to date. Page this afternoon was likely secondary to uncontrolled pain concerning to the family and does not appear to have any decline in her status.   Will continue with ceftriaxone currently, if she develops hypotension or end-organ damage or condition deteriorates will broaden ABX. Will increase her dialudid to 1-2 mg q2hr prn as well as give 1L NS bolus and start her on MIVF as her PO intake has not been good today.

## 2016-03-01 NOTE — Progress Notes (Signed)
Subjective: Kristen Moreno is feeling improved today. Says she still is having intermittent fevers and chills as well as rigors. No further nausea or vomiting since yesterday. Pain is controlled with the pain medications but does require doses every 4 hours to keep it controlled. Has been tolerating PO intake. Eating some solids without issue but little appetite. Reports drinking lots of water. Still reports dark and cloudy urine but not pain or burning with urination.   Objective: Vital signs in last 24 hours: Vitals:   02/29/16 1421 02/29/16 1836 02/29/16 2103 03/01/16 0411  BP: 127/66  (!) 143/69 (!) 148/80  Pulse: (!) 113  (!) 118 (!) 121  Resp:   20 20  Temp: 99.9 F (37.7 C) 98.9 F (37.2 C) (!) 102.9 F (39.4 C) (!) 101.3 F (38.5 C)  TempSrc:   Oral Oral  SpO2: 94%  90% 91%  Weight:      Height:       Weight change:   Intake/Output Summary (Last 24 hours) at 03/01/16 1247 Last data filed at 02/29/16 1519  Gross per 24 hour  Intake              360 ml  Output                0 ml  Net              360 ml    Physical Exam  Constitutional: She is well-developed, well-nourished, and in no distress. No distress.  HENT:  Head: Normocephalic and atraumatic.  Cardiovascular: Regular rhythm and intact distal pulses.  Exam reveals no friction rub.   No murmur heard. Tachycardic   Pulmonary/Chest: Effort normal and breath sounds normal.  Abdominal: Soft. Bowel sounds are normal. She exhibits no distension and no mass. There is tenderness. There is no rebound and no guarding.  Tenderness to palpation in LLQ and RLQ. CVA tenderness on the right, none on the left.  Skin: Skin is warm and dry.    Medications: I have reviewed the patient's current medications. Scheduled Meds: . acetaminophen  1,000 mg Oral Q8H  . cefTRIAXone (ROCEPHIN)  IV  2 g Intravenous Q24H  . heparin  5,000 Units Subcutaneous Q8H  . 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 Tolerating PO intake with no further nausea or vomiting. Still having fevers with Tmax 102.9 overnight, controlled with acetaminophen. This is a complicated UTI given her ADPKD and large burden of cysts. Low suspicion for cystic infection currently. Urine cultures previously have grown E.Coli resistant to ampicillin. Urine culture today is growing out E.Coli with susceptibilities pending. Blood cultures with no growth at less than 24 hours. WBC down trending 15.3 > 13.2 today.  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 GFR in the 15-20 range here, prior records from 2015 with GFR in the 35-40 range. Follow with daily BMET.   Anemia of chronic disease: Hgb 10.8 > 8.6 today. Baseline appears to be 11-12 previously. No overt bleeding noted. Denies any hematuria, hematemesis, hematochezia. Likely dilutional from IVF. Will re-check CBC in the am. Check iron studies tomorrow as well. Transfuse Hgb < 7.0 -CBC in am.  -Check ferritin  HTN: SBP stable in the 140s today. Holding home antihypertensives in setting of sepsis.   Dispo: Disposition is deferred at this time, awaiting improvement of current medical problems.    The patient does have a current PCP Barton Fanny, MD) and does need an [facility] hospital follow-up appointment after discharge.  The patient does not have transportation limitations that hinder transportation to clinic appointments.  .Services Needed at time of discharge: Y = Yes, Blank = No PT:   OT:   RN:   Equipment:   Other:     LOS: 2 days   Maryellen Pile, MD [facility] PGY-2 [phone] 03/01/2016, 12:47 PM

## 2016-03-02 DIAGNOSIS — R112 Nausea with vomiting, unspecified: Secondary | ICD-10-CM

## 2016-03-02 LAB — URINE CULTURE: Culture: >=100000 — AB

## 2016-03-02 LAB — CBC
HCT: 27.7 % — ABNORMAL LOW (ref 36.0–46.0)
Hemoglobin: 9 g/dL — ABNORMAL LOW (ref 12.0–15.0)
MCH: 30.2 pg (ref 26.0–34.0)
MCHC: 32.5 g/dL (ref 30.0–36.0)
MCV: 93 fL (ref 78.0–100.0)
Platelets: 278 10*3/uL (ref 150–400)
RBC: 2.98 MIL/uL — ABNORMAL LOW (ref 3.87–5.11)
RDW: 14.7 % (ref 11.5–15.5)
WBC: 11.3 10*3/uL — ABNORMAL HIGH (ref 4.0–10.5)

## 2016-03-02 LAB — BASIC METABOLIC PANEL
Anion gap: 10 (ref 5–15)
BUN: 30 mg/dL — ABNORMAL HIGH (ref 6–20)
CO2: 17 mmol/L — ABNORMAL LOW (ref 22–32)
Calcium: 8.1 mg/dL — ABNORMAL LOW (ref 8.9–10.3)
Chloride: 108 mmol/L (ref 101–111)
Creatinine, Ser: 3.21 mg/dL — ABNORMAL HIGH (ref 0.44–1.00)
GFR calc Af Amer: 18 mL/min — ABNORMAL LOW (ref >60)
GFR calc non Af Amer: 15 mL/min — ABNORMAL LOW (ref >60)
Glucose, Bld: 100 mg/dL — ABNORMAL HIGH (ref 65–99)
Potassium: 4.4 mmol/L (ref 3.5–5.1)
Sodium: 135 mmol/L (ref 135–145)

## 2016-03-02 LAB — FERRITIN: Ferritin: 272 ng/mL (ref 11–307)

## 2016-03-02 MED ORDER — POLYETHYLENE GLYCOL 3350 17 G PO PACK: 17.0000 g | PACK | Freq: Every day | ORAL | Status: DC | PRN

## 2016-03-02 MED ORDER — DOCUSATE SODIUM 100 MG PO CAPS
100.0000 mg | ORAL_CAPSULE | Freq: Two times a day (BID) | ORAL | Status: DC
  Administered 2016-03-02 – 2016-03-05 (×7): 100 mg via ORAL
  Filled 2016-03-02 (×7): qty 1

## 2016-03-02 MED ORDER — CEFAZOLIN IN D5W 1 GM/50ML IV SOLN
1.0000 g | Freq: Two times a day (BID) | INTRAVENOUS | Status: DC
  Administered 2016-03-02 – 2016-03-06 (×9): 1 g via INTRAVENOUS
  Filled 2016-03-02 (×10): qty 50

## 2016-03-02 NOTE — Progress Notes (Signed)
PHARMACY NOTE:  ANTIMICROBIAL RENAL DOSAGE ADJUSTMENT  Current antimicrobial regimen includes a mismatch between antimicrobial dosage and estimated renal function.  As per policy approved by the Pharmacy & Therapeutics and Medical Executive Committees, the antimicrobial dosage will be adjusted accordingly.  Current antimicrobial dosage:  Cefazolin 1g IV q8  Indication: Pyelonephritis  Renal Function:  Estimated Creatinine Clearance: 20.9 mL/min (by C-G formula based on SCr of 3.21 mg/dL (H)). []      On intermittent HD, scheduled: []      On CRRT    Antimicrobial dosage has been changed to:  Cefazolin 1g IV q12   Thank you for allowing pharmacy to be a part of this patient's care.  Gracy Bruins, PharmD Clinical Pharmacist [facility]

## 2016-03-02 NOTE — Progress Notes (Addendum)
Subjective: Kristen Moreno is much improved this morning. She reports still having chills and rigors at times but they are getting better. Still has poor appetite and some nausea but no further emesis. Does report drinking fluids without issues. Pain is much better controlled but still having occasional bilateral flank pain as well as bilateral lower quadrant/inguinal abdominal pain.  Objective: Vital signs in last 24 hours: Vitals:   03/01/16 1326 03/01/16 1700 03/01/16 2122 03/02/16 0501  BP: 137/81 129/73 140/74 128/72  Pulse: (!) 117 (!) 113 (!) 110 (!) 105  Resp: 16  19 19   Temp: (!) 102.3 F (39.1 C)  (!) 101.6 F (38.7 C) 99.1 F (37.3 C)  TempSrc: Oral  Oral   SpO2: 95%  95% 96%  Weight:      Height:       Weight change:   Intake/Output Summary (Last 24 hours) at 03/02/16 1143 Last data filed at 03/02/16 0865  Gross per 24 hour  Intake          1123.33 ml  Output                0 ml  Net          1123.33 ml    Physical Exam  Constitutional: She is well-developed, well-nourished, and in no distress. No distress.  Cardiovascular: Regular rhythm and normal heart sounds.   tachycardic  Pulmonary/Chest: Effort normal and breath sounds normal.  Abdominal: Soft. Bowel sounds are normal. She exhibits no distension. There is no tenderness. There is no rebound and no guarding.  No CVA tenderness  Skin: Skin is warm and dry.   Medications: I have reviewed the patient's current medications. Scheduled Meds: . acetaminophen  1,000 mg Oral Q8H  . cefTRIAXone (ROCEPHIN)  IV  2 g Intravenous Q24H  . docusate sodium  100 mg Oral BID  . heparin  5,000 Units Subcutaneous Q8H  . pantoprazole  40 mg Oral Daily   Continuous Infusions: . 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 Still with some nausea. Poor appetite but taking in liquids. Tmax 101.6 yesterday evening at 2100. White count trending down 13.2 > 11.3 today. Urine culture growing E.Coli with sensitivities reported. Blood cultures with no growth to date. Will narrow ABX to Ancef today. Continue IV ABX until afebrile >48 hours. Encourage PO intake and ambulate patient.  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 GFR in the 15-20 range here, prior records from 2015 with GFR in the 35-40 range. Cr has been stable in the 3.2-3.4 range with GFR 17-18 for the past several days. This likely represents her baseline. Will need follow up with Renal outpatient once discharged.   Anemia of chronic disease: Hgb 8.6 >9.0 today. Baseline appears to be 11-12. Ferritin level is 272.  HTN: BP stable in the 120-140/70-80 range. Continue to hold home antihypertensives.    Dispo: Disposition is deferred at this time, awaiting improvement of current medical problems.  Anticipated discharge in approximately 2 day(s).   The patient does have a current PCP Kristen Fanny, MD) and does need an [facility] hospital follow-up appointment after discharge.  The patient does not have transportation limitations that  hinder transportation to clinic appointments.  .Services Needed at time of discharge: Y = Yes, Blank = No PT:   OT:   RN:   Equipment:   Other:     LOS: 3 days   Maryellen Pile, MD IMTS PGY-1 [phone] 03/02/2016, 11:43 AM

## 2016-03-03 LAB — CBC
HCT: 26 % — ABNORMAL LOW (ref 36.0–46.0)
Hemoglobin: 8.4 g/dL — ABNORMAL LOW (ref 12.0–15.0)
MCH: 29.8 pg (ref 26.0–34.0)
MCHC: 32.3 g/dL (ref 30.0–36.0)
MCV: 92.2 fL (ref 78.0–100.0)
Platelets: 292 10*3/uL (ref 150–400)
RBC: 2.82 MIL/uL — ABNORMAL LOW (ref 3.87–5.11)
RDW: 14.5 % (ref 11.5–15.5)
WBC: 9.5 10*3/uL (ref 4.0–10.5)

## 2016-03-03 LAB — BASIC METABOLIC PANEL
Anion gap: 9 (ref 5–15)
BUN: 25 mg/dL — ABNORMAL HIGH (ref 6–20)
CO2: 16 mmol/L — ABNORMAL LOW (ref 22–32)
Calcium: 8.2 mg/dL — ABNORMAL LOW (ref 8.9–10.3)
Chloride: 112 mmol/L — ABNORMAL HIGH (ref 101–111)
Creatinine, Ser: 2.97 mg/dL — ABNORMAL HIGH (ref 0.44–1.00)
GFR calc Af Amer: 20 mL/min — ABNORMAL LOW (ref >60)
GFR calc non Af Amer: 17 mL/min — ABNORMAL LOW (ref >60)
Glucose, Bld: 126 mg/dL — ABNORMAL HIGH (ref 65–99)
Potassium: 4.2 mmol/L (ref 3.5–5.1)
Sodium: 137 mmol/L (ref 135–145)

## 2016-03-03 MED ORDER — ACETAMINOPHEN 325 MG PO TABS
650.0000 mg | ORAL_TABLET | Freq: Three times a day (TID) | ORAL | Status: DC
  Administered 2016-03-03 – 2016-03-05 (×7): 650 mg via ORAL
  Filled 2016-03-03 (×9): qty 2

## 2016-03-03 MED ORDER — SENNA 8.6 MG PO TABS
2.0000 | ORAL_TABLET | Freq: Two times a day (BID) | ORAL | Status: DC
  Administered 2016-03-03 – 2016-03-05 (×5): 17.2 mg via ORAL
  Filled 2016-03-03 (×5): qty 2

## 2016-03-03 MED ORDER — OXYCODONE-ACETAMINOPHEN 5-325 MG PO TABS
1.0000 | ORAL_TABLET | ORAL | Status: DC | PRN
  Administered 2016-03-03 – 2016-03-06 (×11): 1 via ORAL
  Filled 2016-03-03 (×12): qty 1

## 2016-03-03 MED ORDER — HYDROMORPHONE HCL 2 MG/ML IJ SOLN
1.0000 mg | INTRAMUSCULAR | Status: DC | PRN
  Administered 2016-03-03 (×2): 2 mg via INTRAVENOUS
  Filled 2016-03-03 (×2): qty 1

## 2016-03-03 MED ORDER — FAMOTIDINE 20 MG PO TABS
10.0000 mg | ORAL_TABLET | Freq: Every day | ORAL | Status: DC
  Administered 2016-03-03 – 2016-03-06 (×4): 10 mg via ORAL
  Filled 2016-03-03 (×4): qty 1

## 2016-03-03 NOTE — Progress Notes (Addendum)
   Subjective:  Patient continues to have improvement in symptoms though not back to baseline. She has had only small amount of PO intake but thinks she can work on this today as her pain is better controlled and she is not needing pain meds as frequently.   Objective:  Vital signs in last 24 hours: Vitals:   03/02/16 2152 03/03/16 0237 03/03/16 0521 03/03/16 1234  BP: (!) 147/77 114/66 134/79   Pulse: (!) 105 90 95   Resp: 18 18 18    Temp: (!) 102.7 F (39.3 C) 98.7 F (37.1 C) 99.5 F (37.5 C) 98.9 F (37.2 C)  TempSrc: Oral Oral Oral Oral  SpO2: 99% 93% 91%   Weight:      Height:       Constitutional: appears in mild discomfort, improved from prior CV: RRR, no murmurs, no LE edema Resp: CTAB, no increased work of breathing Abd: soft, distended, mild diffuse tenderness.   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 Still having breakthrough fevers; last one was last night to 102F. Patient continuing to improve clinically after switch to Ancef. Will switch to PO antibiotics once patient tolerating more PO intake.  --scheduled acetaminophen 625mg  TID --f/u BMP- continuously improving Cr  --f/u BCx - NGTD x3d --supportive IVF as needed --encouraged patient to attempt greater PO intake --switch to PO oxy-apap --sennakot for constipation  Anemia of CKD: Stable. Ferritin 272, though might be falsely elevated in setting of infection. No active bleeding of symptoms of anemia. --f/u AM CBC  HTN: Stable when pain and fever controlled. --continue holding home amlodipine  Dispo: Anticipated discharge in approximately 2-3 day(s).   Alphonzo Grieve, MD 03/03/2016, 1:14 PM Pager [phone]

## 2016-03-03 NOTE — Progress Notes (Signed)
Per the on call Doctor from internal medicine she wants PRN percocet to be given first but not the Dilaudid, pt has already received iv delaudid  Before the doctor's order to stick to the PO pain med, I will be giving the percocet if pt gets into pain

## 2016-03-03 NOTE — Progress Notes (Signed)
PT Cancellation Note  Patient Details Name: Kristen Moreno MRN: [id number] DOB: May 09, 1960   Cancelled Treatment:    Reason Eval/Treat Not Completed: PT screened, no needs identified, will sign off; patient reports independent with mobility up in hallway and to bathroom.  Observed pt up to bathroom.  Denies need for skilled PT at this time.    Reginia Naas 03/03/2016, 11:42 AM  Magda Kiel, [place] 03/03/2016

## 2016-03-04 LAB — CBC
HCT: 24.8 % — ABNORMAL LOW (ref 36.0–46.0)
Hemoglobin: 8.1 g/dL — ABNORMAL LOW (ref 12.0–15.0)
MCH: 29.9 pg (ref 26.0–34.0)
MCHC: 32.7 g/dL (ref 30.0–36.0)
MCV: 91.5 fL (ref 78.0–100.0)
Platelets: 310 10*3/uL (ref 150–400)
RBC: 2.71 MIL/uL — ABNORMAL LOW (ref 3.87–5.11)
RDW: 14.4 % (ref 11.5–15.5)
WBC: 9.3 10*3/uL (ref 4.0–10.5)

## 2016-03-04 LAB — BASIC METABOLIC PANEL
Anion gap: 10 (ref 5–15)
BUN: 24 mg/dL — ABNORMAL HIGH (ref 6–20)
CO2: 17 mmol/L — ABNORMAL LOW (ref 22–32)
Calcium: 8.7 mg/dL — ABNORMAL LOW (ref 8.9–10.3)
Chloride: 110 mmol/L (ref 101–111)
Creatinine, Ser: 2.85 mg/dL — ABNORMAL HIGH (ref 0.44–1.00)
GFR calc Af Amer: 21 mL/min — ABNORMAL LOW (ref >60)
GFR calc non Af Amer: 18 mL/min — ABNORMAL LOW (ref >60)
Glucose, Bld: 106 mg/dL — ABNORMAL HIGH (ref 65–99)
Potassium: 4.5 mmol/L (ref 3.5–5.1)
Sodium: 137 mmol/L (ref 135–145)

## 2016-03-04 MED ORDER — AMLODIPINE BESYLATE 10 MG PO TABS
10.0000 mg | ORAL_TABLET | Freq: Every day | ORAL | Status: DC
  Administered 2016-03-04 – 2016-03-05 (×2): 10 mg via ORAL
  Filled 2016-03-04 (×2): qty 1

## 2016-03-04 NOTE — Progress Notes (Signed)
   Subjective:  Patient continues to have improvement in her symptoms. Her pain has been well controlled on PO medicine and she is using it more sparingly. She has slightly increased her PO intake from yesterday.   Objective:  Vital signs in last 24 hours: Vitals:   03/03/16 1632 03/03/16 2312 03/04/16 0623 03/04/16 0708  BP: (!) 143/72 (!) 142/77 (!) 155/79   Pulse: 83 94 94   Resp: 18 18 17    Temp: 98.5 F (36.9 C) 100.2 F (37.9 C) (!) 101.1 F (38.4 C) 99.4 F (37.4 C)  TempSrc: Oral Oral Oral Oral  SpO2: 99% 96% 99%   Weight:      Height:       Constitutional: Appears comfortable CV: RRR, no murmurs, no LE edema Resp: CTAB, no increased work of breathing Abd: soft, distended, mild diffuse tenderness that has improved, no CVA tenderness   Assessment/Plan:  Principal Problem:   Sepsis secondary to UTI [facility]) Active Problems:   Polycystic kidney disease   Hypertension   Pyelonephritis   Chills   Generalized abdominal pain   Nausea and vomiting in adult  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 Still having breakthrough fevers; last one was this AM to 101F. --cont ancef; will reassess tomorrow for possible switch to PO if fevers and PO intake improving --scheduled acetaminophen 625mg  TID --f/u BMP- continuously improving Cr  --f/u BCx - NGTD x3d --encouraged patient to attempt greater PO intake --PO oxy-apap 5-325mg  q4hr PRN; dilaudid prn for severe uncontrolled pain. --sennakot for constipation  Anemia of CKD: Stable. Ferritin 272, though might be falsely elevated in setting of infection. No active bleeding of symptoms of anemia. --f/u AM CBC  HTN: Has started to be hypertensive more consistently; recent 149/93. --restarting home amlodipine  Dispo: Anticipated discharge in approximately 1-2 day(s).   Alphonzo Grieve, MD 03/04/2016, 2:24 PM Pager [phone]

## 2016-03-05 DIAGNOSIS — I10 Essential (primary) hypertension: Secondary | ICD-10-CM

## 2016-03-05 LAB — BASIC METABOLIC PANEL
Anion gap: 11 (ref 5–15)
BUN: 23 mg/dL — ABNORMAL HIGH (ref 6–20)
CO2: 19 mmol/L — ABNORMAL LOW (ref 22–32)
Calcium: 8.7 mg/dL — ABNORMAL LOW (ref 8.9–10.3)
Chloride: 108 mmol/L (ref 101–111)
Creatinine, Ser: 2.87 mg/dL — ABNORMAL HIGH (ref 0.44–1.00)
GFR calc Af Amer: 20 mL/min — ABNORMAL LOW (ref >60)
GFR calc non Af Amer: 18 mL/min — ABNORMAL LOW (ref >60)
Glucose, Bld: 98 mg/dL (ref 65–99)
Potassium: 4.2 mmol/L (ref 3.5–5.1)
Sodium: 138 mmol/L (ref 135–145)

## 2016-03-05 LAB — CULTURE, BLOOD (ROUTINE X 2)
Culture: NO GROWTH
Culture: NO GROWTH

## 2016-03-05 LAB — CBC
HCT: 25.6 % — ABNORMAL LOW (ref 36.0–46.0)
Hemoglobin: 8.5 g/dL — ABNORMAL LOW (ref 12.0–15.0)
MCH: 30.7 pg (ref 26.0–34.0)
MCHC: 33.2 g/dL (ref 30.0–36.0)
MCV: 92.4 fL (ref 78.0–100.0)
Platelets: 360 10*3/uL (ref 150–400)
RBC: 2.77 MIL/uL — ABNORMAL LOW (ref 3.87–5.11)
RDW: 14.4 % (ref 11.5–15.5)
WBC: 9.4 10*3/uL (ref 4.0–10.5)

## 2016-03-05 MED ORDER — NICOTINE 21 MG/24HR TD PT24
21.0000 mg | MEDICATED_PATCH | Freq: Every day | TRANSDERMAL | Status: DC
  Administered 2016-03-05 – 2016-03-06 (×2): 21 mg via TRANSDERMAL
  Filled 2016-03-05 (×2): qty 1

## 2016-03-05 MED ORDER — POLYETHYLENE GLYCOL 3350 17 G PO PACK
17.0000 g | PACK | Freq: Every day | ORAL | Status: DC
  Filled 2016-03-05: qty 1

## 2016-03-05 MED ORDER — SENNA 8.6 MG PO TABS: 1.0000 | ORAL_TABLET | Freq: Every day | ORAL | Status: DC | PRN

## 2016-03-05 NOTE — Progress Notes (Signed)
   Subjective:  Patient endorses increased abdominal pain yesterday that has slowly improved with pain medicine and bowel movements. She endorses mild nausea yesterday, but denies vomiting. She is still not eating very much but has worked to increase her fluid intake.   Objective:  Vital signs in last 24 hours: Vitals:   03/04/16 1503 03/04/16 2237 03/04/16 2313 03/05/16 0609  BP: (!) 149/93 (!) 151/79  (!) 141/76  Pulse: 92 83  84  Resp: 17 17  17   Temp: 98.2 F (36.8 C) (!) 100.6 F (38.1 C) 99.9 F (37.7 C) 100.3 F (37.9 C)  TempSrc: Oral Oral Oral Oral  SpO2: 100% 97%  95%  Weight:      Height:       Constitutional: Appears comfortable CV: RRR, no murmurs, no LE edema Resp: CTAB, no increased work of breathing Abd: soft, distended, some right sided tenderness, without rebound   Assessment/Plan:  Principal Problem:   Sepsis secondary to UTI [facility]) Active Problems:   Polycystic kidney disease   Hypertension   Pyelonephritis   Chills   Generalized abdominal pain   Nausea and vomiting in adult  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 Still having breakthrough fevers though they are trending down. She is still not taking in too much PO, but feels she may be able to work on this today.  --cont ancef; will switch to PO on discharge --scheduled acetaminophen 625mg  TID  --f/u BCx - NGTD x4d --encouraged patient to attempt greater PO intake --PO oxy-apap 5-325mg  q4hr PRN --sennakot for constipation  HTN: Has started to be hypertensive more consistently. --restarting home amlodipine  Dispo: Anticipated discharge in approximately 1-2 day(s). She will establish with [facility].  Alphonzo Grieve, MD 03/05/2016, 11:14 AM Pager [phone]

## 2016-03-06 DIAGNOSIS — Z87440 Personal history of urinary (tract) infections: Secondary | ICD-10-CM

## 2016-03-06 DIAGNOSIS — N189 Chronic kidney disease, unspecified: Secondary | ICD-10-CM

## 2016-03-06 DIAGNOSIS — A419 Sepsis, unspecified organism: Principal | ICD-10-CM

## 2016-03-06 DIAGNOSIS — N12 Tubulo-interstitial nephritis, not specified as acute or chronic: Secondary | ICD-10-CM

## 2016-03-06 DIAGNOSIS — B9620 Unspecified Escherichia coli [E. coli] as the cause of diseases classified elsewhere: Secondary | ICD-10-CM

## 2016-03-06 DIAGNOSIS — Z8619 Personal history of other infectious and parasitic diseases: Secondary | ICD-10-CM

## 2016-03-06 DIAGNOSIS — Z888 Allergy status to other drugs, medicaments and biological substances status: Secondary | ICD-10-CM

## 2016-03-06 DIAGNOSIS — I129 Hypertensive chronic kidney disease with stage 1 through stage 4 chronic kidney disease, or unspecified chronic kidney disease: Secondary | ICD-10-CM

## 2016-03-06 DIAGNOSIS — Q613 Polycystic kidney, unspecified: Secondary | ICD-10-CM

## 2016-03-06 MED ORDER — ONDANSETRON HCL 4 MG PO TABS: 4.0000 mg | ORAL_TABLET | Freq: Three times a day (TID) | ORAL | 0 refills | Status: DC | PRN

## 2016-03-06 MED ORDER — OXYCODONE-ACETAMINOPHEN 5-325 MG PO TABS
1.0000 | ORAL_TABLET | ORAL | Status: DC | PRN
  Administered 2016-03-06: 2 via ORAL
  Filled 2016-03-06: qty 2

## 2016-03-06 MED ORDER — CEPHALEXIN 500 MG PO CAPS: 500.0000 mg | ORAL_CAPSULE | Freq: Two times a day (BID) | ORAL | 0 refills | Status: AC

## 2016-03-06 MED ORDER — OXYCODONE-ACETAMINOPHEN 5-325 MG PO TABS: 1.0000 | ORAL_TABLET | Freq: Four times a day (QID) | ORAL | 0 refills | Status: DC | PRN

## 2016-03-06 MED ORDER — SENNA 8.6 MG PO TABS: 1.0000 | ORAL_TABLET | Freq: Every day | ORAL | 0 refills | Status: DC | PRN

## 2016-03-06 NOTE — Progress Notes (Signed)
   Subjective:  Patient has remained afebrile. She is still having breakthrough pain at night mostly. She was able to eat yesterday without problems. She feels that she is able to go home today to continue recovery and is agreeable with follow up in our clinic next week.   Objective:  Vital signs in last 24 hours: Vitals:   03/05/16 2312 03/05/16 2312 03/06/16 0519 03/06/16 0617  BP: (!) 145/82 (!) 145/82 (!) 142/76 (!) 142/76  Pulse:  90 84 84  Resp: 20 20 20 20   Temp: 98.4 F (36.9 C) 98.4 F (36.9 C) 98.5 F (36.9 C) 98.5 F (36.9 C)  TempSrc: Oral Oral Oral Oral  SpO2: 97% 97% 95% 95%  Weight:      Height:       Constitutional: Appears comfortable CV: RRR, no murmurs, no LE edema Resp: CTAB, no increased work of breathing Abd: soft, distended, minimal tenderness this AM, without rebound   Assessment/Plan:  Principal Problem:   Sepsis secondary to UTI [facility]) Active Problems:   Polycystic kidney disease   Hypertension   Pyelonephritis   Chills   Generalized abdominal pain   Nausea and vomiting in adult  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 She has been afebrile for  >24hrs, has been tolerating PO intake --cont ancef; will switch to PO on discharge; Day 8/14  --BCx - NGTD x5d - final --PO oxy-apap 5-325mg  1-2 tabs q6hr PRN on discharge --miralax, sennakot  HTN: --amlodipine 10mg  daily  Dispo: Anticipated discharge possibly today. She will establish with [facility].  Alphonzo Grieve, MD 03/06/2016, 10:19 AM Pager [phone]

## 2016-03-06 NOTE — Discharge Summary (Signed)
Name: Kristen Moreno MRN: [id number] DOB: 02-03-1960 56 y.o. PCP: Alphonzo Grieve, MD  Date of Admission: 02/28/2016  3:58 PM Date of Discharge: 03/06/2016 Attending Physician: Axel Filler, MD  Discharge Diagnosis: 1. Pyeolonephritis Principal Problem:   Sepsis secondary to UTI [facility]) Active Problems:   Polycystic kidney disease   Hypertension   Pyelonephritis   Chills   Generalized abdominal pain   Nausea and vomiting in adult  Discharge Medications: Allergies as of 03/06/2016      Reactions   Ace Inhibitors Cough      Medication List    STOP taking these medications   BC HEADACHE POWDER PO   HYDROcodone-acetaminophen 5-325 MG tablet Commonly known as:  NORCO/VICODIN     TAKE these medications   amLODipine 10 MG tablet Commonly known as:  NORVASC Take 1 tablet (10 mg total) by mouth at bedtime. What changed:  when to take this   cephALEXin 500 MG capsule Commonly known as:  KEFLEX Take 1 capsule (500 mg total) by mouth 2 (two) times daily. What changed:  when to take this   MULTIVITAMIN ADULT Tabs Take 1 tablet by mouth daily.   omeprazole 20 MG capsule Commonly known as:  PRILOSEC Take 1 capsule (20 mg total) by mouth daily.   ondansetron 4 MG tablet Commonly known as:  ZOFRAN Take 1 tablet (4 mg total) by mouth every 8 (eight) hours as needed for nausea or vomiting. What changed:  when to take this  reasons to take this   oxyCODONE-acetaminophen 5-325 MG tablet Commonly known as:  PERCOCET/ROXICET Take 1-2 tablets by mouth every 6 (six) hours as needed for moderate pain.   senna 8.6 MG Tabs tablet Commonly known as:  SENOKOT Take 1 tablet (8.6 mg total) by mouth daily as needed for mild constipation.      Disposition and follow-up:   Kristen Moreno was discharged from [facility] in Stable condition.  At the hospital follow up visit please address:  1.   Pyelonephritis: --has she finished her Keflex 500mg   BID course? End date 2/21 --is she having fevers, chills, nausea, vomiting? zofran use? --is her pain well controlled? How much oxy-apap is she using throughout the day? (provided with oxy-apap 5mg  1-2 tab q6hr PRN #30) --does she feel ready to go back to work?  CKD, ADPCKD: --has she been contacted by [facility] for appointment? --consider obtaining BMP to establish baseline renal function --please advise again for her to avoid NSAIDS  2.  Labs / imaging needed at time of follow-up: consider BMP  3.  Pending labs/ test needing follow-up: None  Follow-up Appointments: Follow-up Information    [place]. Go on 03/13/2016.   Why:  at 1:15pm Contact information: [address] [place]. Call.   Why:  If you are not contacted by them in the next week, please call the above number to establish with a kidney doctor. Contact information: [place] [address] [place] Hospital Course by problem list: Principal Problem:   Sepsis secondary to UTI [facility]) Active Problems:   Polycystic kidney disease   Hypertension   Pyelonephritis   Chills   Generalized abdominal pain   Nausea and vomiting in adult   Kristen Moreno is a 56yo female with PMH of ADPKD, HTN, with recent UTI one month prior presenting with progressive  flank and abdominal pain, nausea vomiting, chills and evidence of pyelonephritis on CT imaging.  Pyelonephritis: Patient with N/V, fevers, chills, flank and abdominal pain with clinical signs of fever, leukocytosis, tachycardia, tachypnea and acute on chronic renal failure. She had a recent UTI in Jan 2018 which she felt never completely resolved with antibiotics; at the time, cultures grew E coli that was resistant to ampicillin and unasyn. Patient was started on ceftriaxone empirically as previous cultures showed sensitivity. Urine cultures this admission grew  E coli, resistant to amp, unasyn and with intermediate resistance to zocyn. Blood cultures were negative. Patient continued to have breakthrough fevers for about 5 days; at discharge, she was afebrile for >24hrs, was tolerating PO intake and her pain was controlled. She was discharged on Keflex 500mg  BID (due to her decreased renal function) and was provided with oxy-apap 5mg  1-2 tabs q6hrs PRN #30. She was advised on a bowel regimen while taking opioids.  AKI on CKD, ADPKD: Patient with ADPKD with AKI on CKD on admission in setting of infection and excess use of NSAIDs for her abd and flank pain. Cr on admission was 4.41 (last known was 3.03). Cr on discharge was 2.87. On discharge she was referred to [facility] for management as she is likely to progress to ESRD in the future.   HTN: Patient with history of hypertension on amlodipine 10mg  at home. On admission, mild hypertension was allowed in setting of sepsis. Once pain and infection was under control, she was restarted on her amlodipine 10mg  daily.   Discharge Vitals:   BP 136/74 (BP Location: Left Arm)   Pulse 86   Temp 98.7 F (37.1 C) (Oral)   Resp 20   Ht 5\' 6"  (1.676 m)   Wt 163 lb 12.8 oz (74.3 kg)   SpO2 100%   BMI 26.44 kg/m   Pertinent Labs, Studies, and Procedures:  CMP Latest Ref Rng & Units 03/05/2016 03/04/2016 03/03/2016  Glucose 65 - 99 mg/dL 98 106(H) 126(H)  BUN 6 - 20 mg/dL 23(H) 24(H) 25(H)  Creatinine 0.44 - 1.00 mg/dL 2.87(H) 2.85(H) 2.97(H)  Sodium 135 - 145 mmol/L 138 137 137  Potassium 3.5 - 5.1 mmol/L 4.2 4.5 4.2  Chloride 101 - 111 mmol/L 108 110 112(H)  CO2 22 - 32 mmol/L 19(L) 17(L) 16(L)  Calcium 8.9 - 10.3 mg/dL 8.7(L) 8.7(L) 8.2(L)  Total Protein 6.5 - 8.1 g/dL - - -  Total Bilirubin 0.3 - 1.2 mg/dL - - -  Alkaline Phos 38 - 126 U/L - - -  AST 15 - 41 U/L - - -  ALT 14 - 54 U/L - - -   CBC Latest Ref Rng & Units 03/05/2016 03/04/2016 03/03/2016  WBC 4.0 - 10.5 K/uL 9.4 9.3 9.5  Hemoglobin 12.0  - 15.0 g/dL 8.5(L) 8.1(L) 8.4(L)  Hematocrit 36.0 - 46.0 % 25.6(L) 24.8(L) 26.0(L)  Platelets 150 - 400 K/uL 360 310 292   CT renal: No obstructive uropathy nor acute intra-abdominal/ pelvic process. Nephromegaly and findings of polycystic kidney disease. Severe colonic diverticulosis.  Normal appendix  Renal U/S 02/28/2016: Multiple cysts fill the renal parenchyma bilaterally consistent with polycystic renal disease. No hydronephrosis. Bladder appears normal  Discharge Instructions: Discharge Instructions    Ambulatory referral to Nephrology    Complete by:  As directed    [facility]   Call MD for:  difficulty breathing, headache or visual disturbances    Complete by:  As directed    Call MD for:  extreme fatigue    Complete by:  As directed    Call MD for:  persistant dizziness or light-headedness    Complete by:  As directed    Call MD for:  persistant nausea and vomiting    Complete by:  As directed    Call MD for:  severe uncontrolled pain    Complete by:  As directed    Diet - low sodium heart healthy    Complete by:  As directed    Discharge instructions    Complete by:  As directed    For your kidney infection --take Keflex 500mg  every 12 hours starting tonight at 9pm until you finish all the pills  For pain --you can take 1-2 tablets of oxycodone-acetaminophen every 6 hours if you need it.  The pain medicine has acetaminophen in it (325mg  per tablet). Please make sure that you do not have more than 4000mg  total of acetaminophen from any source in a 24 hour period.   Nausea is a side effect of the pain medicine; it might help to take them with food. I have also given you a prescription for zofran, an anti-nausea medicine if you need it.   Pain medicine can also make you constipated. While taking it, please take miralax every day; and if you are still not having a bowel movement, use sennakot that I have provided a script for.   Pain medicine can make  you drowsy; please don't drive or operate machinery while taking this medicine.  We will see you in clinic next week. If anything comes up before then or you start feeling worse again, please give our clinic a call.   Increase activity slowly    Complete by:  As directed       Signed: Alphonzo Grieve, MD 03/06/2016, 4:29 PM   Pager [phone]

## 2016-03-08 DIAGNOSIS — E785 Hyperlipidemia, unspecified: Secondary | ICD-10-CM | POA: Insufficient documentation

## 2016-03-08 DIAGNOSIS — K2270 Barrett's esophagus without dysplasia: Secondary | ICD-10-CM | POA: Insufficient documentation

## 2016-03-12 ENCOUNTER — Telehealth: Payer: Self-pay | Admitting: Internal Medicine

## 2016-03-12 NOTE — Telephone Encounter (Signed)
APT. REMINDER CALL, LMTCB °

## 2016-03-13 ENCOUNTER — Ambulatory Visit (INDEPENDENT_AMBULATORY_CARE_PROVIDER_SITE_OTHER): Payer: BLUE CROSS/BLUE SHIELD | Admitting: Internal Medicine

## 2016-03-13 ENCOUNTER — Encounter: Payer: Self-pay | Admitting: Internal Medicine

## 2016-03-13 DIAGNOSIS — Z8619 Personal history of other infectious and parasitic diseases: Secondary | ICD-10-CM | POA: Diagnosis not present

## 2016-03-13 DIAGNOSIS — Z09 Encounter for follow-up examination after completed treatment for conditions other than malignant neoplasm: Secondary | ICD-10-CM | POA: Diagnosis not present

## 2016-03-13 DIAGNOSIS — M549 Dorsalgia, unspecified: Secondary | ICD-10-CM

## 2016-03-13 DIAGNOSIS — Z1239 Encounter for other screening for malignant neoplasm of breast: Secondary | ICD-10-CM | POA: Insufficient documentation

## 2016-03-13 DIAGNOSIS — Z8379 Family history of other diseases of the digestive system: Secondary | ICD-10-CM

## 2016-03-13 DIAGNOSIS — Z825 Family history of asthma and other chronic lower respiratory diseases: Secondary | ICD-10-CM

## 2016-03-13 DIAGNOSIS — N12 Tubulo-interstitial nephritis, not specified as acute or chronic: Secondary | ICD-10-CM

## 2016-03-13 DIAGNOSIS — Z23 Encounter for immunization: Secondary | ICD-10-CM

## 2016-03-13 DIAGNOSIS — F17211 Nicotine dependence, cigarettes, in remission: Secondary | ICD-10-CM | POA: Diagnosis not present

## 2016-03-13 DIAGNOSIS — Z87448 Personal history of other diseases of urinary system: Secondary | ICD-10-CM

## 2016-03-13 DIAGNOSIS — Z841 Family history of disorders of kidney and ureter: Secondary | ICD-10-CM

## 2016-03-13 DIAGNOSIS — Z8249 Family history of ischemic heart disease and other diseases of the circulatory system: Secondary | ICD-10-CM | POA: Diagnosis not present

## 2016-03-13 DIAGNOSIS — Z0000 Encounter for general adult medical examination without abnormal findings: Secondary | ICD-10-CM

## 2016-03-13 MED ORDER — OMEPRAZOLE 20 MG PO CPDR: 20.0000 mg | DELAYED_RELEASE_CAPSULE | Freq: Every day | ORAL | 3 refills | Status: DC

## 2016-03-13 MED ORDER — AMLODIPINE BESYLATE 10 MG PO TABS: 10.0000 mg | ORAL_TABLET | Freq: Every day | ORAL | 2 refills | Status: DC

## 2016-03-13 NOTE — Progress Notes (Signed)
CC: follow up of pyelonephritis and hospital follow up   HPI: [name] is a 56 y.o. with past medical history as outlined below who presents to clinic for follow up after hospitalization 2/8-2/15 for sepsis secondary to pyelonephritis. She was discharged on keflex 500 mg BID and completed the course yesterday. Her presenting symptoms of fever and chills have resolved but she continues to have back pain. She has refrained from using NSAIDs and has been using the oxy-apap 5 mg provided at d/c sparingly, less than once per day. Nausea has been well controlled with medications. She returned to work Monday. She knows that she is to schedule follow up with [facility], she hasn't heard from them yet but plans to call today.   Please see problem list for status of the pt's chronic medical problems.  Past Medical History:  Diagnosis Date  . Arthritis    LEFT KNEE   . GERD (gastroesophageal reflux disease)   . Hypertension   . OSA (obstructive sleep apnea)   . Pneumonia   . Polycystic disease, ovaries   . Polycystic kidney disease    1998   Past Surgical History:  Procedure Laterality Date  . ABDOMINAL HYSTERECTOMY  2013  . BLADDER SUSPENSION  08/11/2011   Procedure: TRANSVAGINAL TAPE (TVT) PROCEDURE;  Surgeon: Emily Filbert, MD;  Location: [facility];  Service: Gynecology;  Laterality: N/A;  Add:  Cystoscopy  . FOOT SURGERY Right 08/2014,11/2014   heel spur   . INSERTION OF MESH N/A 07/04/2013   Procedure: INSERTION OF MESH;  Surgeon: Harl Bowie, MD;  Location: [facility];  Service: General;  Laterality: N/A;  . UMBILICAL HERNIA REPAIR N/A 07/04/2013   Procedure: HERNIA REPAIR UMBILICAL ;  Surgeon: Harl Bowie, MD;  Location: [facility];  Service: General;  Laterality: N/A;   Family History  Problem Relation Age of Onset  . Asthma Mother   . Hypertension Mother   . Polycystic kidney disease Mother   . Liver disease Sister   . Polycystic kidney disease Son    Social  History   Social History  . Marital status: Legally Separated    Spouse name: N/A  . Number of children: 2  . Years of education: N/A   Occupational History  . supervisor    Social History Main Topics  . Smoking status: Former Smoker    Packs/day: 0.50    Years: 37.00    Types: Cigarettes    Quit date: 02/28/2016  . Smokeless tobacco: Never Used  . Alcohol use No  . Drug use: No  . Sexual activity: Yes    Birth control/ protection: None   Other Topics Concern  . None   Social History Narrative   Patient lives at home with her room mate.   Patient works full time.   Education some college.   Right handed.   Caffeine one soda per week.   Review of Systems:  Please see each problem below for a pertinent review of systems.  Physical Exam:  Vitals:   03/13/16 1321  BP: 132/67  Pulse: 77  Temp: 98.3 F (36.8 C)  TempSrc: Oral  SpO2: 100%  Weight: 166 lb 4.8 oz (75.4 kg)  Height: 5\' 6"  (1.676 m)   Physical Exam   Assessment & Plan:   See Encounters Tab for problem based charting.  Pyelonephritis  She has completed 2 weeks of antibiotics and symptoms of fever and chillshave resolved.She continues to have significant back pain.  -  follow up with [facility]  - continue zofran and percocet for symptom mgmt   Health maintenance -flu shot today  -referral for mammogram today  Patient seen with Dr. Evette Doffing

## 2016-03-13 NOTE — Patient Instructions (Signed)
It was a pleasure to meet you today Kristen Moreno,   Be sure to schedule an appointment with the kidney doctors at [facility] If you develop chills or a fever again, call the internal medicine office   Please schedule a follow up appointment in 1 month, this appointment can be moved back to 3 months if your pain is better at this time.

## 2016-03-19 DIAGNOSIS — Z0000 Encounter for general adult medical examination without abnormal findings: Secondary | ICD-10-CM | POA: Insufficient documentation

## 2016-03-19 NOTE — Assessment & Plan Note (Signed)
-  flu shot today  -referral for mammogram today

## 2016-03-19 NOTE — Progress Notes (Signed)
Internal Medicine Clinic Attending  I saw and evaluated the patient.  I personally confirmed the key portions of the history and exam documented by Dr. Blum and I reviewed pertinent patient test results.  The assessment, diagnosis, and plan were formulated together and I agree with the documentation in the resident's note. 

## 2016-03-19 NOTE — Assessment & Plan Note (Signed)
Hospitalized 2/8-2/15 for sepsis secondary to pyelonephritis. During hospitalization she had relapsing fever and was discharged on keflex 500 mg BID and completed the course yesterday. Her presenting symptoms of fever and chills have resolved but she continues to have back pain. She has refrained from using NSAIDs and has been using the oxy-apap 5 mg provided at d/c sparingly, less than once per day. Nausea has been well controlled with medications. She returned to work Monday. She knows that she is to schedule follow up with [facility], she hasn't heard from them yet but plans to call today.   She has completed 2 weeks of antibiotics and symptoms of fever and chillshave resolved.She continues to have significant back pain.  -follow up with [facility]  - continue zofran and percocet for symptom mgmt

## 2016-04-10 ENCOUNTER — Ambulatory Visit (INDEPENDENT_AMBULATORY_CARE_PROVIDER_SITE_OTHER): Payer: BLUE CROSS/BLUE SHIELD | Admitting: Internal Medicine

## 2016-04-10 VITALS — BP 148/83 | HR 73 | Temp 98.1°F | Ht 66.0 in | Wt 177.4 lb

## 2016-04-10 DIAGNOSIS — Z87440 Personal history of urinary (tract) infections: Secondary | ICD-10-CM

## 2016-04-10 DIAGNOSIS — K5900 Constipation, unspecified: Secondary | ICD-10-CM | POA: Diagnosis not present

## 2016-04-10 DIAGNOSIS — R350 Frequency of micturition: Secondary | ICD-10-CM

## 2016-04-10 DIAGNOSIS — N189 Chronic kidney disease, unspecified: Secondary | ICD-10-CM

## 2016-04-10 DIAGNOSIS — R3915 Urgency of urination: Secondary | ICD-10-CM | POA: Diagnosis not present

## 2016-04-10 DIAGNOSIS — R1031 Right lower quadrant pain: Secondary | ICD-10-CM | POA: Diagnosis not present

## 2016-04-10 DIAGNOSIS — R300 Dysuria: Secondary | ICD-10-CM

## 2016-04-10 DIAGNOSIS — I129 Hypertensive chronic kidney disease with stage 1 through stage 4 chronic kidney disease, or unspecified chronic kidney disease: Secondary | ICD-10-CM | POA: Diagnosis not present

## 2016-04-10 DIAGNOSIS — R5383 Other fatigue: Secondary | ICD-10-CM

## 2016-04-10 DIAGNOSIS — N390 Urinary tract infection, site not specified: Secondary | ICD-10-CM | POA: Diagnosis not present

## 2016-04-10 DIAGNOSIS — F17211 Nicotine dependence, cigarettes, in remission: Secondary | ICD-10-CM | POA: Diagnosis not present

## 2016-04-10 DIAGNOSIS — Z720 Tobacco use: Secondary | ICD-10-CM

## 2016-04-10 DIAGNOSIS — Q613 Polycystic kidney, unspecified: Secondary | ICD-10-CM

## 2016-04-10 DIAGNOSIS — Z79899 Other long term (current) drug therapy: Secondary | ICD-10-CM | POA: Diagnosis not present

## 2016-04-10 MED ORDER — SULFAMETHOXAZOLE-TRIMETHOPRIM 800-160 MG PO TABS: 1.0000 | ORAL_TABLET | Freq: Two times a day (BID) | ORAL | 0 refills | Status: DC

## 2016-04-10 MED ORDER — SULFAMETHOXAZOLE-TRIMETHOPRIM 800-160 MG PO TABS: 1.0000 | ORAL_TABLET | Freq: Two times a day (BID) | ORAL | 0 refills | Status: AC

## 2016-04-10 NOTE — Patient Instructions (Signed)
We are going to treat your urinary symptoms with Bactrim. Please take one tablet twice a day for 7 days. We will test the urine and culture it; I will call you if we need to change therapy.   For your constipation, please take senna and/or miralax daily as needed. This might help your abdominal pain as well.

## 2016-04-10 NOTE — Progress Notes (Signed)
   CC: flank pain  HPI:  [name] is a 56 y.o. with a PMH of ADPKD, GERD, HTN presenting to clinic for flank pain.  Patient has been hospitalized in past for pyelonephritis with resolution of nausea, vomiting, dysuria and improvement in flank and abdominal pain. She continues to have flank and abdominal pain for which she has been taking tylenol without relief; she has resorted to taking ibuprofen one two occasions this week due to the pain and experienced relief. Patient states that for the past week she has felt experience dysuria, increased frequency, urgency, and 2 episodes of nausea which were controlled with zofran. She endorses chills but no fevers. She has been able to tolerate PO intake.  Patient endorses new onset fatigue and constipation for the last 2-3 weeks. She denies melena or hematochezia. She has not used stool softeners at home but has tried prune juice without relief.   Please see problem based Assessment and Plan for status of patients chronic conditions.  Past Medical History:  Diagnosis Date  . Arthritis    LEFT KNEE   . GERD (gastroesophageal reflux disease)   . Hypertension   . OSA (obstructive sleep apnea)   . Pneumonia   . Polycystic disease, ovaries   . Polycystic kidney disease    1998    Review of Systems:   Review of Systems  Constitutional: Positive for chills and malaise/fatigue. Negative for fever.  Respiratory: Negative for shortness of breath.   Cardiovascular: Negative for chest pain and leg swelling.  Gastrointestinal: Positive for abdominal pain, constipation and nausea. Negative for blood in stool, diarrhea, melena and vomiting.  Genitourinary: Positive for dysuria, flank pain, frequency and urgency. Negative for hematuria.  Musculoskeletal: Negative for myalgias.    Physical Exam:  Vitals:   04/10/16 1354  BP: (!) 145/81  Pulse: 74  Temp: 98.1 F (36.7 C)  TempSrc: Oral  SpO2: 100%  Weight: 177 lb 6.4 oz (80.5 kg)    Height: 5\' 6"  (1.676 m)   Physical Exam  Constitutional: She is oriented to person, place, and time. She appears well-developed and well-nourished. No distress.  HENT:  Head: Normocephalic and atraumatic.  Eyes: EOM are normal.  Neck: Normal range of motion. Neck supple. No thyromegaly present.  Cardiovascular: Normal rate, regular rhythm, normal heart sounds and intact distal pulses.  Exam reveals no gallop and no friction rub.   No murmur heard. Pulmonary/Chest: Effort normal and breath sounds normal. She has no wheezes. She has no rales.  Abdominal: Soft. Bowel sounds are normal. She exhibits no distension. There is tenderness (mild RLQ pain). There is no rebound and no guarding.  CVA tenderness on left  Musculoskeletal: Normal range of motion. She exhibits no edema.  Neurological: She is alert and oriented to person, place, and time.  Skin: Skin is warm and dry. No rash noted. She is not diaphoretic. No erythema. No pallor.  Psychiatric: She has a normal mood and affect. Her behavior is normal. Judgment and thought content normal.    Assessment & Plan:   See Encounters Tab for problem based charting.   Patient discussed with Dr. Nilsa Nutting, MD Internal Medicine PGY1

## 2016-04-11 LAB — MICROSCOPIC EXAMINATION
Bacteria, UA: NONE SEEN
Casts: NONE SEEN /lpf

## 2016-04-11 LAB — URINALYSIS, COMPLETE
Bilirubin, UA: NEGATIVE
Glucose, UA: NEGATIVE
Ketones, UA: NEGATIVE
Leukocytes, UA: NEGATIVE
Nitrite, UA: NEGATIVE
RBC, UA: NEGATIVE
Specific Gravity, UA: 1.012 (ref 1.005–1.030)
Urobilinogen, Ur: 0.2 mg/dL (ref 0.2–1.0)
pH, UA: 5 (ref 5.0–7.5)

## 2016-04-11 LAB — TSH: TSH: 1.5 u[IU]/mL (ref 0.450–4.500)

## 2016-04-12 LAB — URINE CULTURE: Organism ID, Bacteria: NO GROWTH

## 2016-04-13 DIAGNOSIS — K5900 Constipation, unspecified: Secondary | ICD-10-CM | POA: Insufficient documentation

## 2016-04-13 NOTE — Assessment & Plan Note (Signed)
Patient to f/u with [facility]. Still having abdominal and flank pain since discharge that is unchanged.   Plan: --f/u renal recc's

## 2016-04-13 NOTE — Assessment & Plan Note (Signed)
Patient endorses new onset fatigue and constipation for the last 2-3 weeks. She denies melena or hematochezia. She has not used stool softeners at home but has tried prune juice without relief.  Plan: --TSH - wnl --advised use of miralax +/- senna

## 2016-04-13 NOTE — Assessment & Plan Note (Addendum)
Patient with CKD with HTN and ADPKD. Last Cr 2.9 in 2/18 with GRF 20. She will follow up with [facility] next month. Patient on amlodipine 10mg  daily; hypertensive in office but was in pain (was well controlled on last visit).  Advised to refrain from NSAIDs

## 2016-04-13 NOTE — Assessment & Plan Note (Signed)
Patient has stopped smoking since her hospital admission in February. She used nicotine patches while in the hospital, then did not require nicotine replacement or pharmacological management.

## 2016-04-13 NOTE — Assessment & Plan Note (Signed)
Patient states that for the past week she has felt experience dysuria, increased frequency, urgency, and 2 episodes of nausea which were controlled with zofran. She endorses chills but no fevers. She has been able to tolerate PO intake.  UCx from Feb hospitalization grew E coli sensitive to cipro and bactrim. Exam reveals CVA tenderness. Signs and symptoms consistent with UTI.  Plan: --begin treatment with Bactrim  --UA, UCx

## 2016-04-14 NOTE — Progress Notes (Signed)
Internal Medicine Clinic Attending  Case discussed with Dr. Svalina  at the time of the visit.  We reviewed the resident's history and exam and pertinent patient test results.  I agree with the assessment, diagnosis, and plan of care documented in the resident's note.  

## 2016-05-05 DIAGNOSIS — N2581 Secondary hyperparathyroidism of renal origin: Secondary | ICD-10-CM | POA: Diagnosis not present

## 2016-05-05 DIAGNOSIS — D631 Anemia in chronic kidney disease: Secondary | ICD-10-CM | POA: Diagnosis not present

## 2016-05-05 DIAGNOSIS — I129 Hypertensive chronic kidney disease with stage 1 through stage 4 chronic kidney disease, or unspecified chronic kidney disease: Secondary | ICD-10-CM | POA: Diagnosis not present

## 2016-05-05 DIAGNOSIS — N184 Chronic kidney disease, stage 4 (severe): Secondary | ICD-10-CM | POA: Diagnosis not present

## 2016-05-06 ENCOUNTER — Other Ambulatory Visit: Payer: Self-pay | Admitting: Nephrology

## 2016-05-06 DIAGNOSIS — N184 Chronic kidney disease, stage 4 (severe): Secondary | ICD-10-CM

## 2016-05-15 ENCOUNTER — Other Ambulatory Visit: Payer: BLUE CROSS/BLUE SHIELD

## 2016-06-02 ENCOUNTER — Ambulatory Visit
Admission: RE | Admit: 2016-06-02 | Discharge: 2016-06-02 | Disposition: A | Discharge status: Home / Self Care | Payer: BLUE CROSS/BLUE SHIELD | Source: Ambulatory Visit | Attending: Nephrology | Admitting: Nephrology

## 2016-06-02 DIAGNOSIS — Q613 Polycystic kidney, unspecified: Secondary | ICD-10-CM | POA: Diagnosis not present

## 2016-06-02 DIAGNOSIS — N184 Chronic kidney disease, stage 4 (severe): Secondary | ICD-10-CM

## 2016-06-27 DIAGNOSIS — N184 Chronic kidney disease, stage 4 (severe): Secondary | ICD-10-CM | POA: Diagnosis not present

## 2016-06-27 DIAGNOSIS — D631 Anemia in chronic kidney disease: Secondary | ICD-10-CM | POA: Diagnosis not present

## 2016-07-16 ENCOUNTER — Encounter: Payer: BLUE CROSS/BLUE SHIELD | Admitting: Internal Medicine

## 2016-07-17 ENCOUNTER — Encounter: Payer: Self-pay | Admitting: Internal Medicine

## 2016-07-20 DIAGNOSIS — N12 Tubulo-interstitial nephritis, not specified as acute or chronic: Secondary | ICD-10-CM

## 2016-07-20 HISTORY — DX: Tubulo-interstitial nephritis, not specified as acute or chronic: N12

## 2016-07-25 ENCOUNTER — Ambulatory Visit (INDEPENDENT_AMBULATORY_CARE_PROVIDER_SITE_OTHER): Payer: BLUE CROSS/BLUE SHIELD | Admitting: Internal Medicine

## 2016-07-25 ENCOUNTER — Observation Stay (HOSPITAL_COMMUNITY)
Admission: AD | Admit: 2016-07-25 | Discharge: 2016-07-26 | Disposition: A | Discharge status: Home / Self Care | Payer: BLUE CROSS/BLUE SHIELD | Source: Ambulatory Visit | Attending: Internal Medicine | Admitting: Internal Medicine

## 2016-07-25 ENCOUNTER — Observation Stay (HOSPITAL_COMMUNITY): Payer: BLUE CROSS/BLUE SHIELD

## 2016-07-25 ENCOUNTER — Encounter (HOSPITAL_COMMUNITY): Payer: Self-pay | Admitting: General Practice

## 2016-07-25 ENCOUNTER — Encounter (HOSPITAL_COMMUNITY): Payer: Self-pay

## 2016-07-25 VITALS — BP 147/81 | HR 109 | Temp 98.6°F | Ht 66.0 in | Wt 178.9 lb

## 2016-07-25 DIAGNOSIS — N12 Tubulo-interstitial nephritis, not specified as acute or chronic: Principal | ICD-10-CM | POA: Insufficient documentation

## 2016-07-25 DIAGNOSIS — Z888 Allergy status to other drugs, medicaments and biological substances status: Secondary | ICD-10-CM | POA: Diagnosis not present

## 2016-07-25 DIAGNOSIS — R1032 Left lower quadrant pain: Secondary | ICD-10-CM

## 2016-07-25 DIAGNOSIS — R350 Frequency of micturition: Secondary | ICD-10-CM | POA: Diagnosis not present

## 2016-07-25 DIAGNOSIS — K219 Gastro-esophageal reflux disease without esophagitis: Secondary | ICD-10-CM | POA: Diagnosis not present

## 2016-07-25 DIAGNOSIS — R112 Nausea with vomiting, unspecified: Secondary | ICD-10-CM

## 2016-07-25 DIAGNOSIS — R319 Hematuria, unspecified: Secondary | ICD-10-CM | POA: Diagnosis not present

## 2016-07-25 DIAGNOSIS — G4733 Obstructive sleep apnea (adult) (pediatric): Secondary | ICD-10-CM | POA: Diagnosis not present

## 2016-07-25 DIAGNOSIS — Z87891 Personal history of nicotine dependence: Secondary | ICD-10-CM | POA: Insufficient documentation

## 2016-07-25 DIAGNOSIS — R109 Unspecified abdominal pain: Secondary | ICD-10-CM

## 2016-07-25 DIAGNOSIS — N184 Chronic kidney disease, stage 4 (severe): Secondary | ICD-10-CM | POA: Diagnosis not present

## 2016-07-25 DIAGNOSIS — N179 Acute kidney failure, unspecified: Secondary | ICD-10-CM | POA: Insufficient documentation

## 2016-07-25 DIAGNOSIS — I129 Hypertensive chronic kidney disease with stage 1 through stage 4 chronic kidney disease, or unspecified chronic kidney disease: Secondary | ICD-10-CM | POA: Insufficient documentation

## 2016-07-25 DIAGNOSIS — Z90710 Acquired absence of both cervix and uterus: Secondary | ICD-10-CM | POA: Diagnosis not present

## 2016-07-25 DIAGNOSIS — Z79899 Other long term (current) drug therapy: Secondary | ICD-10-CM | POA: Diagnosis not present

## 2016-07-25 DIAGNOSIS — Q613 Polycystic kidney, unspecified: Secondary | ICD-10-CM | POA: Insufficient documentation

## 2016-07-25 DIAGNOSIS — M545 Low back pain: Secondary | ICD-10-CM

## 2016-07-25 DIAGNOSIS — K449 Diaphragmatic hernia without obstruction or gangrene: Secondary | ICD-10-CM | POA: Diagnosis not present

## 2016-07-25 DIAGNOSIS — M1712 Unilateral primary osteoarthritis, left knee: Secondary | ICD-10-CM | POA: Diagnosis not present

## 2016-07-25 DIAGNOSIS — K5730 Diverticulosis of large intestine without perforation or abscess without bleeding: Secondary | ICD-10-CM | POA: Insufficient documentation

## 2016-07-25 HISTORY — DX: Tubulo-interstitial nephritis, not specified as acute or chronic: N12

## 2016-07-25 LAB — POCT URINALYSIS DIPSTICK
Bilirubin, UA: NEGATIVE
Glucose, UA: NEGATIVE
Ketones, UA: NEGATIVE
Nitrite, UA: NEGATIVE
Spec Grav, UA: 1.02 (ref 1.010–1.025)
Urobilinogen, UA: 0.2 E.U./dL
pH, UA: 6 (ref 5.0–8.0)

## 2016-07-25 LAB — CBC WITH DIFFERENTIAL/PLATELET
Basophils Absolute: 0 10*3/uL (ref 0.0–0.1)
Basophils Relative: 0 %
Eosinophils Absolute: 0.1 10*3/uL (ref 0.0–0.7)
Eosinophils Relative: 1 %
HCT: 35.5 % — ABNORMAL LOW (ref 36.0–46.0)
Hemoglobin: 11.5 g/dL — ABNORMAL LOW (ref 12.0–15.0)
Lymphocytes Relative: 14 %
Lymphs Abs: 1.6 10*3/uL (ref 0.7–4.0)
MCH: 29.9 pg (ref 26.0–34.0)
MCHC: 32.4 g/dL (ref 30.0–36.0)
MCV: 92.4 fL (ref 78.0–100.0)
Monocytes Absolute: 0.5 10*3/uL (ref 0.1–1.0)
Monocytes Relative: 4 %
Neutro Abs: 9.2 10*3/uL — ABNORMAL HIGH (ref 1.7–7.7)
Neutrophils Relative %: 81 %
Platelets: 316 10*3/uL (ref 150–400)
RBC: 3.84 MIL/uL — ABNORMAL LOW (ref 3.87–5.11)
RDW: 13.5 % (ref 11.5–15.5)
WBC: 11.4 10*3/uL — ABNORMAL HIGH (ref 4.0–10.5)

## 2016-07-25 LAB — URINALYSIS, ROUTINE W REFLEX MICROSCOPIC
Bilirubin Urine: NEGATIVE
Bilirubin Urine: NEGATIVE
Glucose, UA: NEGATIVE mg/dL
Glucose, UA: NEGATIVE mg/dL
Ketones, ur: NEGATIVE mg/dL
Ketones, ur: NEGATIVE mg/dL
Nitrite: NEGATIVE
Nitrite: NEGATIVE
Protein, ur: 100 mg/dL — AB
Protein, ur: 100 mg/dL — AB
Specific Gravity, Urine: 1.011 (ref 1.005–1.030)
Specific Gravity, Urine: 1.01 (ref 1.005–1.030)
pH: 6 (ref 5.0–8.0)
pH: 6 (ref 5.0–8.0)

## 2016-07-25 LAB — BASIC METABOLIC PANEL
Anion gap: 10 (ref 5–15)
BUN: 58 mg/dL — ABNORMAL HIGH (ref 6–20)
CO2: 16 mmol/L — ABNORMAL LOW (ref 22–32)
Calcium: 9.1 mg/dL (ref 8.9–10.3)
Chloride: 110 mmol/L (ref 101–111)
Creatinine, Ser: 4.61 mg/dL — ABNORMAL HIGH (ref 0.44–1.00)
GFR calc Af Amer: 11 mL/min — ABNORMAL LOW (ref >60)
GFR calc non Af Amer: 10 mL/min — ABNORMAL LOW (ref >60)
Glucose, Bld: 103 mg/dL — ABNORMAL HIGH (ref 65–99)
Potassium: 4.9 mmol/L (ref 3.5–5.1)
Sodium: 136 mmol/L (ref 135–145)

## 2016-07-25 LAB — SODIUM, URINE, RANDOM: Sodium, Ur: 73 mmol/L

## 2016-07-25 LAB — CREATININE, URINE, RANDOM: Creatinine, Urine: 72.14 mg/dL

## 2016-07-25 MED ORDER — SODIUM CHLORIDE 0.9 % IV SOLN
INTRAVENOUS | Status: DC
  Administered 2016-07-25 – 2016-07-26 (×3): via INTRAVENOUS

## 2016-07-25 MED ORDER — SODIUM CHLORIDE 0.9 % IV BOLUS (SEPSIS): 1000.0000 mL | Freq: Once | INTRAVENOUS | Status: DC

## 2016-07-25 MED ORDER — PANTOPRAZOLE SODIUM 40 MG PO TBEC
40.0000 mg | DELAYED_RELEASE_TABLET | Freq: Every day | ORAL | Status: DC
  Administered 2016-07-26: 40 mg via ORAL
  Filled 2016-07-25: qty 1

## 2016-07-25 MED ORDER — OXYCODONE-ACETAMINOPHEN 5-325 MG PO TABS
1.0000 | ORAL_TABLET | Freq: Four times a day (QID) | ORAL | Status: DC | PRN
  Administered 2016-07-25 – 2016-07-26 (×3): 2 via ORAL
  Filled 2016-07-25 (×4): qty 2

## 2016-07-25 MED ORDER — HEPARIN SODIUM (PORCINE) 5000 UNIT/ML IJ SOLN
5000.0000 [IU] | Freq: Three times a day (TID) | INTRAMUSCULAR | Status: DC
  Administered 2016-07-25 – 2016-07-26 (×3): 5000 [IU] via SUBCUTANEOUS
  Filled 2016-07-25 (×3): qty 1

## 2016-07-25 MED ORDER — ONDANSETRON HCL 4 MG/2ML IJ SOLN: 4.0000 mg | Freq: Three times a day (TID) | INTRAMUSCULAR | Status: DC | PRN

## 2016-07-25 MED ORDER — IOPAMIDOL (ISOVUE-300) INJECTION 61%
INTRAVENOUS | Status: AC
  Administered 2016-07-25: 30 mL
  Filled 2016-07-25: qty 30

## 2016-07-25 MED ORDER — AMLODIPINE BESYLATE 10 MG PO TABS
10.0000 mg | ORAL_TABLET | Freq: Every day | ORAL | Status: DC
  Administered 2016-07-25: 10 mg via ORAL
  Filled 2016-07-25: qty 1

## 2016-07-25 MED ORDER — ACETAMINOPHEN 325 MG PO TABS
650.0000 mg | ORAL_TABLET | Freq: Four times a day (QID) | ORAL | Status: DC | PRN
Start: 2016-07-25 — End: 2016-07-26
  Administered 2016-07-25: 650 mg via ORAL
  Filled 2016-07-25: qty 2

## 2016-07-25 MED ORDER — PIPERACILLIN-TAZOBACTAM IN DEX 2-0.25 GM/50ML IV SOLN
2.2500 g | Freq: Three times a day (TID) | INTRAVENOUS | Status: DC
  Administered 2016-07-25: 2.25 g via INTRAVENOUS
  Filled 2016-07-25 (×3): qty 50

## 2016-07-25 MED ORDER — ONDANSETRON 4 MG PO TBDP: 4.0000 mg | ORAL_TABLET | Freq: Once | ORAL | Status: DC

## 2016-07-25 MED ORDER — ONDANSETRON HCL 4 MG PO TABS
4.0000 mg | ORAL_TABLET | Freq: Four times a day (QID) | ORAL | Status: DC | PRN
  Administered 2016-07-26: 4 mg via ORAL
  Filled 2016-07-25: qty 1

## 2016-07-25 MED ORDER — DEXTROSE 5 % IV SOLN
1.0000 g | INTRAVENOUS | Status: DC
  Administered 2016-07-25: 1 g via INTRAVENOUS
  Filled 2016-07-25 (×2): qty 10

## 2016-07-25 NOTE — Progress Notes (Signed)
Internal Medicine Clinic Attending  I saw and evaluated the patient.  I personally confirmed the key portions of the history and exam documented by Dr. Hoffman and I reviewed pertinent patient test results.  The assessment, diagnosis, and plan were formulated together and I agree with the documentation in the resident's note.      

## 2016-07-25 NOTE — Progress Notes (Addendum)
Pharmacy Antibiotic Note  Kristen Moreno is a 56 y.o. female admitted on 07/25/2016 with diverticulitis.  Pharmacy has been consulted to transition from Zosyn to ceftriaxone dosing. Tmax/24h 102.2, WBC 11.4.  Plan: D/c Zosyn Start ceftriaxone 1g IV q24h F/u clinical progress, c/s, LOT Rx will s/o consult - not renally adjusted   Height: 5\' 6"  (167.6 cm) Weight: 179 lb 4.8 oz (81.3 kg) IBW/kg (Calculated) : 59.3  Temp (24hrs), Avg:99.9 F (37.7 C), Min:98.6 F (37 C), Max:102.2 F (39 C)   Recent Labs Lab 07/25/16 0946  WBC 11.4*  CREATININE 4.61*    Estimated Creatinine Clearance: 15 mL/min (A) (by C-G formula based on SCr of 4.61 mg/dL (H)).    Allergies  Allergen Reactions  . Ace Inhibitors Cough     Elicia Lamp, PharmD, BCPS Clinical Pharmacist 07/25/2016 6:12 PM

## 2016-07-25 NOTE — Progress Notes (Signed)
Report was given to [place] on [address].  Patient transported via wheelchair to [place].  Patient alert and oriented.  Sander Nephew, RN 07/25/2016 10:51 AM

## 2016-07-25 NOTE — Assessment & Plan Note (Addendum)
  Assessment: Left flank and lower abdominal pain Patient presents with 3 day history of left flank and lower abdominal pain that has progressively gotten worse. She has associated symptoms of urinary frequency and suprapubic pressure, hematuria, nausea and vomiting. She has had a similar episode in February 2018 where she was admitted for pyelonephritis. She states her symptoms are similar to that episode.  Urine dipstick in office showed large blood, protein and small leukocytes. UA and culture were obtained and are pending. Bmet and CBC was also obtained and is pending.  Patient also has severe tenderness to the left lower quadrant. Will admit patient for CT without contrast (due to CKDIV) of abdomen for further workup of her pain.  She will also need fluid resuscitation which will be started in the clinic and pain control. Suspect this is another episode of pyelonephritis versus kidney stone versus diverticulitis .   Plan - Urine dipstick  - UA and urine culture - CBC and BMP - IV NS bolus

## 2016-07-25 NOTE — Progress Notes (Signed)
   CC: Left flank and left lower abdominal pain  HPI:  Kristen Moreno is a 56 y.o. woman with history noted below that presents today to the acute care clinic for a 3 day history of left lower abdominal and left flank  pain that is progressively getting worse. She has radiating pain to her right abdomen and right lower back.  She describes the pain as sharp and throbbing that is constant. She has associated symptoms of subjective fever/chills, nausea and vomiting.  She is unable to tolerate solid foods or liquids. She has taken two Tylenol and Advil with minimal relief in pain. She reports seeing hematuria yesterday and states that her urine is now dark.     Past Medical History:  Diagnosis Date  . Arthritis    LEFT KNEE   . GERD (gastroesophageal reflux disease)   . Hypertension   . OSA (obstructive sleep apnea)   . Pneumonia   . Polycystic disease, ovaries   . Polycystic kidney disease    1998    Review of Systems: Review of Systems  Constitutional: Positive for chills, fever and malaise/fatigue.  Respiratory: Negative for cough and shortness of breath.   Gastrointestinal: Positive for abdominal pain, diarrhea, nausea and vomiting.  Genitourinary: Positive for flank pain, frequency, hematuria and urgency. Negative for dysuria.     Physical Exam:  Vitals:   07/25/16 0914  BP: (!) 147/81  Pulse: (!) 109  Temp: 98.6 F (37 C)  TempSrc: Oral  SpO2: 100%  Weight: 178 lb 14.4 oz (81.1 kg)  Height: 5\' 6"  (1.676 m)   Physical Exam  Constitutional: She appears distressed.  Cardiovascular:  Tachycardic   Pulmonary/Chest: Breath sounds normal. No respiratory distress. She has no wheezes. She has no rales.  Abdominal: Soft. Bowel sounds are normal.  Tenderness to left lower abomen  Musculoskeletal:  Left and right flank pain to palpation  Skin: Skin is warm and dry.    Assessment & Plan:   See encounters tab for problem based medical decision making.    Patient  seen with Dr. Dareen Piano

## 2016-07-25 NOTE — H&P (Signed)
Date: 07/25/2016               Patient Name:  Kristen Moreno MRN: [id number]  DOB: 21-Nov-1960 Age / Sex: 56 y.o., female   PCP: Alphonzo Grieve, MD         Medical Service: Internal Medicine Teaching Service         Attending Physician: Dr. Oval Linsey, MD    First Contact: Mr. Lethea Killings, [facility] MS4 Pager: [phone]  Second Contact: Dr. Shela Leff Pager: [phone]       After Hours (After 5p/  First Contact Pager: [phone]  weekends / holidays): Second Contact Pager: [phone]   Chief Complaint: abdominal pain  History of Present Illness: Patient is a 56 year old female with history of PCKD, HTN, abdominal hernia repair, GERD, TAH with BSO, and recurrent pyelonephritis presenting with 4 days of worsening abdominal pain with 1 day of diarrhea and vomiting, as well as dark urine since yesterday. The patient was seen in the IM clinic and transferred here. The patient says she woke up to mild left lower quadrant abdominal pain three days ago that has progressively worsened in severity. She now endorses a mild degree of pain diffusely throughout the abdomen in addition to severe LLQ pain. The pain is present constantly and characterized as throbbing but with intermittent sharpness. The pain also seems to spread to her bilateral flanks, with flank pain being worsened by positional changes or by sitting upright. The patient also says that on the car ride here, bumps in the car caused exacerbation of her abdominal pain. For the first two days after onset of abdominal pain, she had normal bowel movements without nausea or vomiting. On the first day, she had a tooth ache for which she took two tablets of an unknown antibiotic that were prescribed to her daughter. On day three (yesterday), the patient had two episodes of sudden onset watery diarrhea. However the diarrhea has not continued as she has not had a bowel movement since yesterday. Furthermore, the patient endorses nausea and  vomiting that started this morning prior to presenting to the hospital. It was several episodes of large-volume, non-bloody, non-bilious emesis. This seemingly occurred after taking in both solid foods and liquids. She has not had continued nausea or vomiting today. The patient has a surgical history of laparoscopic hernia repair for subumbilical hernia and total abdominal hysterectomy with bilateral salpingo-oophorectomy. She does not believe she has had a cholecystectomy or appendectomy. The patient does feel that in the past week, she has had increasing abdominal discomfort in the lower abdomen near hernia site, exacerbated by lifting heavy objects at work. Her job involves strenuous physical activity. She says she has perhaps felt something "pushing out" of her abdomen. Finally, the patient also endorses worsening reflux. The patient endorses subjective chills at home though she has not measured her temperature. She denies shortness of breath, chest pain, constipation, vaginal bleeding, and other systemic symptoms on ROS.   Meds:  Current Facility-Administered Medications for the 07/25/16 encounter [facility] Encounter)  Medication  . sodium chloride 0.9 % bolus 1,000 mL   Current Meds  Medication Sig  . amLODipine (NORVASC) 10 MG tablet Take 1 tablet (10 mg total) by mouth at bedtime.   Allergies: Allergies as of 07/25/2016 - Review Complete 07/25/2016  Allergen Reaction Noted  . Ace inhibitors Cough 08/05/2011   Past Medical History:  Diagnosis Date  . Arthritis    LEFT KNEE   . GERD (gastroesophageal reflux disease)   .  Hypertension   . OSA (obstructive sleep apnea)   . Pneumonia   . Polycystic disease, ovaries   . Polycystic kidney disease    1998  . Pyelonephritis 07/2016   Family History: History of PCKD in multiple family members. No other history of abdominal issues.   Social History: She quit smoking a few months ago. She does not drink alcohol or use illicit drugs.    Review of Systems: A complete ROS was negative except as per HPI.   Physical Exam: Blood pressure (!) 148/74, pulse (!) 103, temperature 99 F (37.2 C), temperature source Oral, height 5\' 6"  (1.676 m), weight 81.3 kg (179 lb 4.8 oz), SpO2 99 %. Physical Exam  Constitutional: She is oriented to person, place, and time. She appears well-developed and well-nourished. She appears distressed.  HENT:  Head: Normocephalic and atraumatic.  Mouth/Throat: Oropharynx is clear and moist.  Eyes: Conjunctivae and EOM are normal. Pupils are equal, round, and reactive to light.  Neck: Normal range of motion. Neck supple. No JVD present.  Cardiovascular: Normal rate, regular rhythm, normal heart sounds and intact distal pulses.  Exam reveals no gallop and no friction rub.   No murmur heard. Pulmonary/Chest: Effort normal and breath sounds normal. No stridor. No respiratory distress. She has no wheezes.  Abdominal: Soft. Bowel sounds are normal. She exhibits no distension and no mass. There is no hepatosplenomegaly. There is generalized tenderness and tenderness in the left lower quadrant. There is CVA tenderness. There is no rigidity, no rebound, no guarding and negative Murphy's sign. No hernia.  Bilateral flank pain R worse than L  Musculoskeletal: Normal range of motion.  Neurological: She is alert and oriented to person, place, and time. No cranial nerve deficit.  Skin: Skin is warm and dry. Capillary refill takes less than 2 seconds. She is not diaphoretic.  Nursing note and vitals reviewed.  EKG: Not obtained   CXR: Not obtained   Assessment & Plan by Problem: Active Problems:   Pyelonephritis  1. Abdominal pain: Patient presented afebrile 37.2 C with tachycardia but became febrile 5 hours later to 39 C and continued tachycardia. Patient appears to be tearful and in significant distress. Ddx includes diverticulitis, nephrolithiasis, pyelo, UTI, infectious colitis, appendicitis, renal abscess.  CT abdomen pelvis negative for diverticulitis or any acute intrabdominal pathology. Showing stable PCKD. Patient was given one dose of Zosyn prior to obtaining CT scan results. Given significant R flank pain and history of recurrent pyelo, will change to ceftriaxone to cover for possbile pyelonephritis. U/A notable for +large blood on dipstick, +leuks (mod), rare bacteruria, but without nitrites. White count is slightly elevated to 11.4. Patient meets 2/4 SIRS criteria now with fever and tachycardia.   - D/c Zosyn - Start ceftriaxone   - NPO given h/o emesis  - Zofran PRN N/V  - Percocet 5-325 1-2 tabs q6h PRN for pain  - Urine cx pending   2. AKI on CKD: Patient has a history of CKD IV with creatinine baseline of 2.8 - 2.9. Presented with Cr of 4.61. Could be pre-renal in setting of diarrhea and emesis but BUN/Cr ratio is <20. Glomerulonephritis is on the differential in the setting of proteinuria and hematuria. CT negative for obstructive uropathy. Will start on IVF.  - IVF 150 mL/hr NS  - Urine Na  - Urine Cr  HTN: History of HTN, presented mildly hypertensive with SBP 130s-140s. Will restart home norvasc 10 mg.  GERD: Restarted home pantoprazole 40 mg.  Dispo: Admit patient to Inpatient with expected length of stay greater than 2 midnights.  Signed: Lethea Killings, Medical Student 07/25/2016, 2:40 PM  Pager: [phone]  Attestation for Student Documentation:  I personally was present and performed or re-performed the history, physical exam and medical decision-making activities of this service and have verified that the service and findings are accurately documented in the student's note.  Shela Leff, MD 07/25/2016, 6:20 PM

## 2016-07-26 DIAGNOSIS — N12 Tubulo-interstitial nephritis, not specified as acute or chronic: Secondary | ICD-10-CM | POA: Diagnosis not present

## 2016-07-26 LAB — CBC
HCT: 32.3 % — ABNORMAL LOW (ref 36.0–46.0)
Hemoglobin: 10.5 g/dL — ABNORMAL LOW (ref 12.0–15.0)
MCH: 29.9 pg (ref 26.0–34.0)
MCHC: 32.5 g/dL (ref 30.0–36.0)
MCV: 92 fL (ref 78.0–100.0)
Platelets: 278 10*3/uL (ref 150–400)
RBC: 3.51 MIL/uL — ABNORMAL LOW (ref 3.87–5.11)
RDW: 13.4 % (ref 11.5–15.5)
WBC: 9.5 10*3/uL (ref 4.0–10.5)

## 2016-07-26 LAB — BASIC METABOLIC PANEL
Anion gap: 9 (ref 5–15)
BUN: 49 mg/dL — ABNORMAL HIGH (ref 6–20)
CO2: 15 mmol/L — ABNORMAL LOW (ref 22–32)
Calcium: 8.5 mg/dL — ABNORMAL LOW (ref 8.9–10.3)
Chloride: 112 mmol/L — ABNORMAL HIGH (ref 101–111)
Creatinine, Ser: 4.11 mg/dL — ABNORMAL HIGH (ref 0.44–1.00)
GFR calc Af Amer: 13 mL/min — ABNORMAL LOW (ref >60)
GFR calc non Af Amer: 11 mL/min — ABNORMAL LOW (ref >60)
Glucose, Bld: 96 mg/dL (ref 65–99)
Potassium: 4.9 mmol/L (ref 3.5–5.1)
Sodium: 136 mmol/L (ref 135–145)

## 2016-07-26 LAB — URINE CULTURE: Culture: <10000 — AB

## 2016-07-26 MED ORDER — SULFAMETHOXAZOLE-TRIMETHOPRIM 400-80 MG PO TABS: 1.0000 | ORAL_TABLET | Freq: Two times a day (BID) | ORAL | 0 refills | Status: DC

## 2016-07-26 MED ORDER — OXYCODONE-ACETAMINOPHEN 5-325 MG PO TABS: 1.0000 | ORAL_TABLET | Freq: Four times a day (QID) | ORAL | Status: DC | PRN

## 2016-07-26 NOTE — Discharge Summary (Signed)
Name: Kristen Moreno MRN: [id number] DOB: Feb 13, 1960 56 y.o. PCP: Alphonzo Grieve, MD  Date of Admission: 07/25/2016 11:07 AM Date of Discharge: 07/27/2016 Attending Physician: No att. providers found  Discharge Diagnosis: 1. Pyelonephritis 2. AKI on CKD 3. HTN 4. GERD  Active Problems:   Pyelonephritis   Discharge Medications: Allergies as of 07/26/2016      Reactions   Ace Inhibitors Cough      Medication List    TAKE these medications   amLODipine 10 MG tablet Commonly known as:  NORVASC Take 1 tablet (10 mg total) by mouth at bedtime.   omeprazole 20 MG capsule Commonly known as:  PRILOSEC Take 1 capsule (20 mg total) by mouth daily.   ondansetron 4 MG tablet Commonly known as:  ZOFRAN Take 1 tablet (4 mg total) by mouth every 8 (eight) hours as needed for nausea or vomiting.   senna 8.6 MG Tabs tablet Commonly known as:  SENOKOT Take 1 tablet (8.6 mg total) by mouth daily as needed for mild constipation.   sulfamethoxazole-trimethoprim 400-80 MG tablet Commonly known as:  BACTRIM Take 1 tablet by mouth 2 (two) times daily.      Disposition and follow-up:   [name] was discharged from [facility] in Good condition.  At the hospital follow up visit please address:  1. She still having abdominal or flank pain? Patient needs further workup for hematuria.  2.  Labs / imaging needed at time of follow-up: Repeat BMP to assess renal function, UA with microscopy to assess for continued hematuria  3.  Pending labs/ test needing follow-up: None  Follow-up Appointments: Follow-up Information    Alphonzo Grieve, MD Follow up.   Specialty:  Internal Medicine Why:  Please call and make an appointment for a follow-up visit in 7 days.  Contact information: [place] [phone]           Hospital Course by problem list: Active Problems:   Pyelonephritis   1. Pyelonephritis: Patient presented with a  few days of nausea, vomiting, diarrhea, and severe LLQ abdominal pain and bilateral flank pain. She was afebrile on presentation but spiked intermittent fevers throughout the hospital course. Her urine studies showed significant hemoglobinuria and RBCs, with +dipstick for blood. She did not have a clear urinary tract infection as she had low bacteriuria and no nitrites. She had an abdominal CT performed which did not demonstrate acute intraabdominal pathology or clear evidence of pyelonephritis. Other etiologies of intra-renal injury such as glomerulonephritis were considered but further workup was deferred to the outpatient setting. Given her symptoms and history of similar presentations that improved with treatment for pyelo, she was started empirically on ceftriaxone which her organisms have been sensitive to in the past. Her pain improved and she was tolerated normal foods by discharged without continued emesis. She had normal bowel function throughout the hospitalization. Her ceftriaxone IV was transitioned to PO bactrim upon discharge, which she was told to continue for 6 more days.  2. AKI on CKD: Patient has a history of CKD stage IV with baseline creatinine of 2.8 - 2.9. She presented with Cr elevated to 4.6. Etiology of AKI was found to be likely a combination of pre- and intra-renal etiologies in setting of volume losses from diarrhea/emesis and pyelonephritis as above. She was started on empiric antibiotics as above and maintenance fluids, and her creatinine downtrended to 4.1 on the day of discharge.   3. HTN: Patient has a  history of well-controlled hypertension at home with norvasc 10 mg. She was maintained on this medication throughout her hospital course.   4. GERD: Patient has a history of GERD and had some continued complaints of reflux in the hospital. Her reflux improved with by mouth PPI therapy.   Discharge Vitals:   BP 129/65 (BP Location: Left Arm)   Pulse 100   Temp 99.8 F (37.7  C) (Oral)   Resp 18   Ht 5\' 6"  (1.676 m)   Wt 179 lb 4.8 oz (81.3 kg)   SpO2 97%   BMI 28.94 kg/m   Pertinent Labs, Studies, and Procedures:  Lab Results  Component Value Date   WBC 9.5 07/26/2016   HGB 10.5 (L) 07/26/2016   HCT 32.3 (L) 07/26/2016   MCV 92.0 07/26/2016   PLT 278 07/26/2016   Lab Results  Component Value Date   NA 136 07/26/2016   K 4.9 07/26/2016   CL 112 (H) 07/26/2016   CO2 15 (L) 07/26/2016   Lab Results  Component Value Date   BUN 49 (H) 07/26/2016   Lab Results  Component Value Date   CREATININE 4.11 (H) 07/26/2016    Discharge Instructions:  Take Bactrim 1 tablet twice daily for 6 days.   Please call and make an appointment at the [facility] internal medicine clinic for a follow-up visit in one week.  Please go to your appointment with the kidney doctors on Wednesday (07/30/2016).  Discharge Instructions    Call MD for:  persistant nausea and vomiting    Complete by:  As directed    Call MD for:  severe uncontrolled pain    Complete by:  As directed    Call MD for:  temperature >100.4    Complete by:  As directed    Diet - low sodium heart healthy    Complete by:  As directed    Increase activity slowly    Complete by:  As directed      Shela Leff, MD PGY3 - IMTS Pager [phone]

## 2016-07-26 NOTE — Progress Notes (Signed)
Patient discharged home per MD. Reviewed discharge instructions with patient. Teach back used to evaluate understanding. Patient to pick up antibiotic from [facility] on [address]. Bartholomew Crews, RN

## 2016-07-26 NOTE — Discharge Instructions (Signed)
Take Bactrim 1 tablet twice daily for 6 days.   Please call and make an appointment at the [facility] internal medicine clinic for a follow-up visit in one week.  Please go to your appointment with the kidney doctors on Wednesday (07/30/2016).   Pyelonephritis, Adult Pyelonephritis is a kidney infection. The kidneys are organs that help clean your blood by moving waste out of your blood and into your pee (urine). This infection can happen quickly, or it can last for a long time. In most cases, it clears up with treatment and does not cause other problems. Follow these instructions at home: Medicines  Take over-the-counter and prescription medicines only as told by your doctor.  Take your antibiotic medicine as told by your doctor. Do not stop taking the medicine even if you start to feel better. General instructions  Drink enough fluid to keep your pee clear or pale yellow.  Avoid caffeine, tea, and carbonated drinks.  Pee (urinate) often. Avoid holding in pee for long periods of time.  Pee before and after sex.  After pooping (having a bowel movement), women should wipe from front to back. Use each tissue only once.  Keep all follow-up visits as told by your doctor. This is important. Contact a doctor if:  You do not feel better after 2 days.  Your symptoms get worse.  You have a fever. Get help right away if:  You cannot take your medicine or drink fluids as told.  You have chills and shaking.  You throw up (vomit).  You have very bad pain in your side (flank) or back.  You feel very weak or you pass out (faint). This information is not intended to replace advice given to you by your health care provider. Make sure you discuss any questions you have with your health care provider. Document Released: 02/14/2004 Document Revised: 06/14/2015 Document Reviewed: 05/01/2014 Elsevier Interactive Patient Education  [employer].

## 2016-07-26 NOTE — Progress Notes (Signed)
   Subjective: Patient had one episode of emesis this morning before eating breakfast. She has not had a bowel movement since admission, though she was NPO for much of yesterday. She was started on clear liquids for breakfast this morning and tolerated them well without nausea or vomiting. She feels that her pain is improving though it seems to be in a different location of her abdomen than before. She understands the plan and agrees.   Objective:  Vital signs in last 24 hours: Vitals:   07/25/16 1113 07/25/16 1446 07/25/16 2059 07/26/16 0501  BP: (!) 148/74 133/65 137/76 (!) 142/76  Pulse: (!) 103 (!) 101 93 (!) 104  Resp:  18 18 18   Temp: 99 F (37.2 C) (!) 102.2 F (39 C) 99.2 F (37.3 C) 99.8 F (37.7 C)  TempSrc: Oral Oral Oral Oral  SpO2: 99% 98% 99% 98%  Weight:  81.3 kg (179 lb 4.8 oz)    Height:  5\' 6"  (1.676 m)     Physical Exam  Constitutional: She is oriented to person, place, and time. She appears well-developed and well-nourished. No distress.  HENT:  Head: Normocephalic and atraumatic.  Cardiovascular: Normal rate, regular rhythm and normal heart sounds.   Pulmonary/Chest: Effort normal and breath sounds normal.  Abdominal: Soft. Bowel sounds are normal. She exhibits no distension. There is no rebound and no guarding.  Tender to palpation at LUQ.  Bilateral CVA tenderness R > L.  Musculoskeletal: Normal range of motion.  Neurological: She is alert and oriented to person, place, and time.   Assessment/Plan:  Active Problems:   Pyelonephritis  1. Pyelonephritis: Treating with ceftriaxone empirically for pyelonephritis given flank pain, intermittent fevers, abnormal U/A and lab evidence of intra-renal injury. Pyelo not specifically noted on CT. Cyst rupture (patient has PCKD) also a possibility, may be difficult to evaluate with only PO contrast. Other intra-abdominal pathologies also not seen on CT. Repeat U/A showed large number of RBCs, frank blood and significant  hemoglobin, +leuks but rare bacteria and no nitrites. Urine cx with <10,000 CFUs. However, patient is clinically improving with IV ceftriaxone and remained afebrile since yesterday afternoon.  - Continue ceftriaxone 1g IV QD with transition to keflex on d/c if patient continues to improve  - Advance back to [place] for lunch  - Zofran PRN for nausea  - Tylenol 650 mg q6h PRN and percocet 5-325 1-2 tabs q6h PRN for pain   2. AKI on CKD: History of CKD IV with Cr baseline of 2.8 - 2.9. Presented with Cr of 4.61 and now down-trending to 4.1 this AM. May be combination of pre-renal from diarrhea and emesis or intra-renal from pyelonephritis. FeNa shows 3.1% likely intra-renal injury. - D/c IVF, able to tolerate PO intake  - Per patient, she already has an appointment scheduled with nephrology on Wednesday, 07/30/2016  HTN: On home norvasc 10 mg, well controlled. GERD: Restarted home pantoprazole 40 mg.   Dispo: Anticipated discharge in approximately 0-1 day(s).   Lethea Killings, Medical Student 07/26/2016, 10:31 AM Pager: [phone]  Attestation for Student Documentation:  I personally was present and performed or re-performed the history, physical exam and medical decision-making activities of this service and have verified that the service and findings are accurately documented in the student's note.  Shela Leff, MD 07/26/2016, 1:10 PM

## 2016-07-31 ENCOUNTER — Other Ambulatory Visit: Payer: Self-pay

## 2016-07-31 DIAGNOSIS — Z01810 Encounter for preprocedural cardiovascular examination: Secondary | ICD-10-CM

## 2016-07-31 DIAGNOSIS — N185 Chronic kidney disease, stage 5: Secondary | ICD-10-CM

## 2016-08-15 ENCOUNTER — Encounter: Payer: Self-pay | Admitting: Internal Medicine

## 2016-08-15 ENCOUNTER — Ambulatory Visit (INDEPENDENT_AMBULATORY_CARE_PROVIDER_SITE_OTHER): Payer: BLUE CROSS/BLUE SHIELD | Admitting: Internal Medicine

## 2016-08-15 VITALS — BP 139/75 | HR 75 | Temp 98.4°F | Ht 66.0 in | Wt 184.3 lb

## 2016-08-15 DIAGNOSIS — N8110 Cystocele, unspecified: Secondary | ICD-10-CM

## 2016-08-15 DIAGNOSIS — Z87891 Personal history of nicotine dependence: Secondary | ICD-10-CM | POA: Diagnosis not present

## 2016-08-15 DIAGNOSIS — N813 Complete uterovaginal prolapse: Secondary | ICD-10-CM | POA: Diagnosis not present

## 2016-08-15 HISTORY — DX: Cystocele, unspecified: N81.10

## 2016-08-15 NOTE — Patient Instructions (Signed)
Kristen Moreno,  It was a pleasure meeting you today. Below her some Keagle exercises that could help strengthen your pelvic floor muscles. A referral has been made to OB/GYN to further discuss options for your prolapse.   Kegel Exercises Kegel exercises help strengthen the muscles that support the rectum, vagina, small intestine, bladder, and uterus. Doing Kegel exercises can help:  Improve bladder and bowel control.  Improve sexual response.  Reduce problems and discomfort during pregnancy.  Kegel exercises involve squeezing your pelvic floor muscles, which are the same muscles you squeeze when you try to stop the flow of urine. The exercises can be done while sitting, standing, or lying down, but it is best to vary your position. Phase 1 exercises 1. Squeeze your pelvic floor muscles tight. You should feel a tight lift in your rectal area. If you are a female, you should also feel a tightness in your vaginal area. Keep your stomach, buttocks, and legs relaxed. 2. Hold the muscles tight for up to 10 seconds. 3. Relax your muscles. Repeat this exercise 50 times a day or as many times as told by your health care provider. Continue to do this exercise for at least 4-6 weeks or for as long as told by your health care provider. This information is not intended to replace advice given to you by your health care provider. Make sure you discuss any questions you have with your health care provider. Document Released: 12/24/2011 Document Revised: 09/01/2015 Document Reviewed: 11/26/2014 Elsevier Interactive Patient Education  [employer].

## 2016-08-15 NOTE — Progress Notes (Signed)
   CC:  Pelvic organ prolapse   HPI:  [name] is a 56 y.o. female with history noted below the presence of acute care clinic for pelvic organ prolapse. Patient states that she started feeling pressure in her vagina one month ago. In the past 2-3 weeks she has noticed a prolapse. Patient has discomfort with her prolapse. She denies pain, urinary symptoms, or itching.  She states that at work she has to lift heavy boxes and the prolapse gets worse with this activity. She also states her prolapse gets worse with sneezing or coughing.  She states she has had 3 vaginal deliveries and has had a hysterectomy.    Past Medical History:  Diagnosis Date  . Arthritis    LEFT KNEE   . GERD (gastroesophageal reflux disease)   . Hypertension   . OSA (obstructive sleep apnea)   . Pneumonia   . Polycystic disease, ovaries   . Polycystic kidney disease    1998  . Pyelonephritis 07/2016    Review of Systems: As noted per HPI  Physical Exam:  Vitals:   08/15/16 1445  BP: 139/75  Pulse: 75  Temp: 98.4 F (36.9 C)  TempSrc: Oral  SpO2: 100%  Weight: 184 lb 4.8 oz (83.6 kg)  Height: 5\' 6"  (1.676 m)   Physical Exam  Constitutional: She is well-developed, well-nourished, and in no distress.  Genitourinary: No vaginal discharge found.  Genitourinary Comments: Cystocele noted in vaginal vault and protrudes out of vaginal vault with cough  Skin: Skin is warm and dry. No rash noted. No erythema.     Assessment & Plan:   See encounters tab for problem based medical decision making.   Patient seen with Dr. Daryll Drown

## 2016-08-19 NOTE — Progress Notes (Signed)
Internal Medicine Clinic Attending  I saw and evaluated the patient.  I personally confirmed the key portions of the history and exam documented by Dr. Hoffman and I reviewed pertinent patient test results.  The assessment, diagnosis, and plan were formulated together and I agree with the documentation in the resident's note.      

## 2016-08-19 NOTE — Assessment & Plan Note (Addendum)
Assessment: Pelvic organ prolapse stage III cystocele Patient has a one-month history of vaginal pressure with a 2 to 3 -week history of noticed prolapse.  On exam a grade III cystocele was noted.  Will refer to OBGYN to discuss surgical options.  Plan -referral to OBGYN - gave pamphlet on kegel exercises

## 2016-08-26 ENCOUNTER — Encounter: Payer: Self-pay | Admitting: Obstetrics & Gynecology

## 2016-09-17 ENCOUNTER — Encounter: Payer: Self-pay | Admitting: Vascular Surgery

## 2016-09-19 ENCOUNTER — Ambulatory Visit (INDEPENDENT_AMBULATORY_CARE_PROVIDER_SITE_OTHER)
Admission: RE | Admit: 2016-09-19 | Discharge: 2016-09-19 | Disposition: A | Discharge status: Home / Self Care | Payer: BLUE CROSS/BLUE SHIELD | Source: Ambulatory Visit | Attending: Vascular Surgery | Admitting: Vascular Surgery

## 2016-09-19 ENCOUNTER — Encounter: Payer: Self-pay | Admitting: Vascular Surgery

## 2016-09-19 ENCOUNTER — Ambulatory Visit (HOSPITAL_COMMUNITY)
Admission: RE | Admit: 2016-09-19 | Discharge: 2016-09-19 | Disposition: A | Discharge status: Home / Self Care | Payer: BLUE CROSS/BLUE SHIELD | Source: Ambulatory Visit | Attending: Vascular Surgery | Admitting: Vascular Surgery

## 2016-09-19 ENCOUNTER — Other Ambulatory Visit: Payer: Self-pay | Admitting: *Deleted

## 2016-09-19 ENCOUNTER — Ambulatory Visit (INDEPENDENT_AMBULATORY_CARE_PROVIDER_SITE_OTHER): Payer: BLUE CROSS/BLUE SHIELD | Admitting: Vascular Surgery

## 2016-09-19 VITALS — BP 153/96 | HR 68 | Temp 97.0°F | Resp 16 | Ht 66.0 in | Wt 180.0 lb

## 2016-09-19 DIAGNOSIS — N185 Chronic kidney disease, stage 5: Secondary | ICD-10-CM

## 2016-09-19 DIAGNOSIS — Z01810 Encounter for preprocedural cardiovascular examination: Secondary | ICD-10-CM | POA: Insufficient documentation

## 2016-09-19 DIAGNOSIS — N184 Chronic kidney disease, stage 4 (severe): Secondary | ICD-10-CM | POA: Diagnosis not present

## 2016-09-19 NOTE — Progress Notes (Signed)
Vitals:   09/19/16 0909  BP: (!) 147/92  Pulse: 68  Resp: 16  Temp: (!) 97 F (36.1 C)  SpO2: 100%  Weight: 180 lb (81.6 kg)  Height: 5\' 6"  (1.676 m)

## 2016-09-19 NOTE — Progress Notes (Signed)
Patient ID: Kristen Moreno, female   DOB: July 26, 1960, 56 y.o.   MRN: [id number]  Reason for Consult: New Patient (Initial Visit) (eval for AVF)   Referred by Elmarie Shiley, MD  Subjective:     HPI:  Kristen Moreno is a 56 y.o. female with chronic kidney disease presents for evaluation of permanent dialysis access placement. She has never had dialysis. She does have questions regarding heme oh versus peritoneal dialysis. She is never had a portion of the surgery. She is right-hand dominant. She does not take blood thinners.  Past Medical History:  Diagnosis Date  . Arthritis    LEFT KNEE   . GERD (gastroesophageal reflux disease)   . Hypertension   . OSA (obstructive sleep apnea)   . Pneumonia   . Polycystic disease, ovaries   . Polycystic kidney disease    1998  . Pyelonephritis 07/2016   Family History  Problem Relation Age of Onset  . Asthma Mother   . Hypertension Mother   . Polycystic kidney disease Mother   . Liver disease Sister   . Polycystic kidney disease Son    Past Surgical History:  Procedure Laterality Date  . ABDOMINAL HYSTERECTOMY  2013  . BLADDER SUSPENSION  08/11/2011   Procedure: TRANSVAGINAL TAPE (TVT) PROCEDURE;  Surgeon: Emily Filbert, MD;  Location: [facility];  Service: Gynecology;  Laterality: N/A;  Add:  Cystoscopy  . FOOT SURGERY Right 08/2014,11/2014   heel spur   . INSERTION OF MESH N/A 07/04/2013   Procedure: INSERTION OF MESH;  Surgeon: Harl Bowie, MD;  Location: [facility];  Service: General;  Laterality: N/A;  . UMBILICAL HERNIA REPAIR N/A 07/04/2013   Procedure: HERNIA REPAIR UMBILICAL ;  Surgeon: Harl Bowie, MD;  Location: [facility];  Service: General;  Laterality: N/A;    Short Social History:  Social History  Substance Use Topics  . Smoking status: Former Smoker    Packs/day: 0.50    Years: 37.00    Types: Cigarettes    Quit date: 02/28/2016  . Smokeless tobacco: Never Used  . Alcohol use No    Allergies  Allergen  Reactions  . Ace Inhibitors Cough    Current Outpatient Prescriptions  Medication Sig Dispense Refill  . amLODipine (NORVASC) 10 MG tablet Take 1 tablet (10 mg total) by mouth at bedtime. 90 tablet 2  . omeprazole (PRILOSEC) 20 MG capsule Take 1 capsule (20 mg total) by mouth daily. 90 capsule 3  . traMADol (ULTRAM) 50 MG tablet   0  . amoxicillin-clavulanate (AUGMENTIN) 500-125 MG tablet   0  . ondansetron (ZOFRAN) 4 MG tablet Take 1 tablet (4 mg total) by mouth every 8 (eight) hours as needed for nausea or vomiting. (Patient not taking: Reported on 09/19/2016) 20 tablet 0  . promethazine (PHENERGAN) 25 MG tablet   1  . senna (SENOKOT) 8.6 MG TABS tablet Take 1 tablet (8.6 mg total) by mouth daily as needed for mild constipation. (Patient not taking: Reported on 09/19/2016) 120 each 0  . sulfamethoxazole-trimethoprim (BACTRIM) 400-80 MG tablet Take 1 tablet by mouth 2 (two) times daily. (Patient not taking: Reported on 09/19/2016) 12 tablet 0   No current facility-administered medications for this visit.     Review of Systems  Constitutional:  Constitutional negative. HENT: HENT negative.  Eyes: Positive for loss of vision.   Cardiovascular: Positive for chest tightness and leg swelling.  Musculoskeletal: Musculoskeletal negative.  Skin: Skin negative.  Neurological: Neurological negative. Hematologic: Hematologic/lymphatic negative.  Psychiatric: Psychiatric negative.        Objective:  Objective   Vitals:   09/19/16 0909 09/19/16 0912  BP: (!) 147/92 (!) 153/96  Pulse: 68 68  Resp: 16   Temp: (!) 97 F (36.1 C)   SpO2: 100%   Weight: 180 lb (81.6 kg)   Height: 5\' 6"  (1.676 m)    Body mass index is 29.05 kg/m.  Physical Exam  Constitutional: She is oriented to person, place, and time. She appears well-developed.  HENT:  Head: Normocephalic.  Eyes: Pupils are equal, round, and reactive to light.  Neck: Normal range of motion. Neck supple.  Cardiovascular: Normal  rate.   Pulses:      Carotid pulses are 2+ on the right side, and 2+ on the left side.      Radial pulses are 2+ on the right side, and 2+ on the left side.  Abdominal: Soft.  Musculoskeletal: Normal range of motion. She exhibits no edema.  Neurological: She is alert and oriented to person, place, and time.  Skin: Skin is warm.  Psychiatric: She has a normal mood and affect. Her behavior is normal. Judgment and thought content normal.    Data: I've independently interpreted her vein mapping to have adequate cephalic vein on the left upper arm.  I have also reviewed her upper from an arterial duplex which has triphasic waveforms bilaterally. Brachial artery at the antecubital fossa on the left 0.44 cm.     Assessment/Plan:     56 year old female here for evaluation of permanent dialysis access. We discussed catheter versus graft versus fistula. At this time she would not need a graft. She does have questions regarding peritoneal dialysis and I directed her to ask Dr. Posey Pronto. We will get her scheduled after her next visit with him for left upper extremity AV fistula. We discussed risks benefits and alternatives and she agrees to proceed.     Waynetta Sandy MD [facility]

## 2016-09-24 ENCOUNTER — Encounter: Payer: Self-pay | Admitting: Obstetrics & Gynecology

## 2016-09-24 ENCOUNTER — Ambulatory Visit (INDEPENDENT_AMBULATORY_CARE_PROVIDER_SITE_OTHER): Payer: BLUE CROSS/BLUE SHIELD | Admitting: Obstetrics & Gynecology

## 2016-09-24 ENCOUNTER — Other Ambulatory Visit (HOSPITAL_COMMUNITY)
Admission: RE | Admit: 2016-09-24 | Discharge: 2016-09-24 | Disposition: A | Discharge status: Home / Self Care | Payer: BLUE CROSS/BLUE SHIELD | Source: Ambulatory Visit | Attending: Obstetrics & Gynecology | Admitting: Obstetrics & Gynecology

## 2016-09-24 VITALS — BP 157/87 | HR 66 | Ht 66.0 in | Wt 181.9 lb

## 2016-09-24 DIAGNOSIS — N8110 Cystocele, unspecified: Secondary | ICD-10-CM

## 2016-09-24 DIAGNOSIS — L918 Other hypertrophic disorders of the skin: Secondary | ICD-10-CM | POA: Insufficient documentation

## 2016-09-24 DIAGNOSIS — L989 Disorder of the skin and subcutaneous tissue, unspecified: Secondary | ICD-10-CM

## 2016-09-24 NOTE — Progress Notes (Signed)
Error

## 2016-09-24 NOTE — Progress Notes (Addendum)
   Subjective:    Patient ID: Kristen Moreno, female    DOB: 1960/04/22, 56 y.o.   MRN: [id number]  HPI 56 yo separated AA P2 here today with a 3 month h/o feeling prolapse of some vaginal organ. This is worse when she Valsalvas. 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    Objective:   Physical Exam Well nourished, well hydrated black female, no apparent distress Breathing, conversing, and ambulating normally Large skin tag/mole/wart on right upper thigh near groin We removed this and sent to it pathology using betadine, 1% lidocaine, and a scapel. Silver nitrate achieved hemostasis. 3rd degree cystocele, no rectocele    Assessment & Plan:  Fecal incontinence and cystocele- Refer to Dr. Maryland Pink

## 2016-09-24 NOTE — Addendum Note (Signed)
Addended by: Wendelyn Breslow L on: 09/24/2016 10:55 AM   Modules accepted: Orders

## 2016-09-24 NOTE — Addendum Note (Signed)
Addended by: Wendelyn Breslow L on: 09/24/2016 11:35 AM   Modules accepted: Orders

## 2016-09-29 ENCOUNTER — Encounter (HOSPITAL_COMMUNITY): Payer: Self-pay

## 2016-09-29 DIAGNOSIS — D631 Anemia in chronic kidney disease: Secondary | ICD-10-CM | POA: Diagnosis not present

## 2016-09-29 DIAGNOSIS — N2581 Secondary hyperparathyroidism of renal origin: Secondary | ICD-10-CM | POA: Diagnosis not present

## 2016-09-29 DIAGNOSIS — N184 Chronic kidney disease, stage 4 (severe): Secondary | ICD-10-CM | POA: Diagnosis not present

## 2016-10-02 ENCOUNTER — Encounter (HOSPITAL_COMMUNITY): Payer: Self-pay | Admitting: *Deleted

## 2016-10-05 NOTE — Anesthesia Preprocedure Evaluation (Deleted)
Anesthesia Evaluation  Patient identified by MRN, date of birth, ID band Patient awake    Reviewed: Allergy & Precautions, H&P , NPO status , Patient's Chart, lab work & pertinent test results  Airway Mallampati: II  TM Distance: >3 FB Neck ROM: Full    Dental no notable dental hx. (+) Teeth Intact, Dental Advisory Given   Pulmonary neg pulmonary ROS, pneumonia, resolved, Recent URI , Resolved, Current Smoker, former smoker,    Pulmonary exam normal breath sounds clear to auscultation       Cardiovascular hypertension, Pt. on medications + Peripheral Vascular Disease   Rhythm:Regular Rate:Normal     Neuro/Psych  Headaches, Depression    GI/Hepatic Neg liver ROS, GERD  Medicated,  Endo/Other  negative endocrine ROS  Renal/GU Renal InsufficiencyRenal diseasePolycystic renal disease. Bladder dysfunction  SUI    Musculoskeletal negative musculoskeletal ROS (+) Arthritis , Osteoarthritis,    Abdominal   Peds  Hematology negative hematology ROS (+)   Anesthesia Other Findings   Reproductive/Obstetrics Uterine Fibroids                            Anesthesia Physical  Anesthesia Plan  ASA: III  Anesthesia Plan: MAC and General   Post-op Pain Management:    Induction: Intravenous  PONV Risk Score and Plan: 2 and 3 and Ondansetron, Dexamethasone, Treatment may vary due to age or medical condition and Midazolam  Airway Management Planned: Nasal Cannula, Natural Airway, Simple Face Mask and LMA  Additional Equipment:   Intra-op Plan:   Post-operative Plan: Extubation in OR  Informed Consent: I have reviewed the patients History and Physical, chart, labs and discussed the procedure including the risks, benefits and alternatives for the proposed anesthesia with the patient or authorized representative who has indicated his/her understanding and acceptance.   Dental advisory given  Plan  Discussed with: CRNA  Anesthesia Plan Comments: (Discuss with surgeon oral tube vs LMA.)        Anesthesia Quick Evaluation                                   Anesthesia Evaluation  Patient identified by MRN, date of birth, ID band Patient awake    Reviewed: Allergy & Precautions, H&P , NPO status , Patient's Chart, lab work & pertinent test results  Airway Mallampati: II TM Distance: >3 FB Neck ROM: Full    Dental no notable dental hx. (+) Teeth Intact, Dental Advisory Given   Pulmonary neg pulmonary ROS, pneumonia -, resolved, Recent URI , Resolved, Current Smoker,  Was diagnosed with Lower Respiratory infection last Tue. Cough has pretty much resolved. Took Azithromycin for 5 days last dose 7/19. Resolved. breath sounds clear to auscultation  Pulmonary exam normal       Cardiovascular hypertension, Pt. on medications Rhythm:Regular Rate:Normal     Neuro/Psych  Headaches, Depression    GI/Hepatic Neg liver ROS, GERD-  Medicated,  Endo/Other  negative endocrine ROS  Renal/GU Renal InsufficiencyRenal diseasePolycystic renal disease. Bladder dysfunction  SUI    Musculoskeletal negative musculoskeletal ROS (+)   Abdominal   Peds  Hematology negative hematology ROS (+)   Anesthesia Other Findings   Reproductive/Obstetrics Uterine Fibroids                           Anesthesia Physical Anesthesia Plan  ASA: III  Anesthesia Plan: General   Post-op Pain Management:    Induction: Intravenous  Airway Management Planned: LMA and Oral ETT  Additional Equipment:   Intra-op Plan:   Post-operative Plan: Extubation in OR  Informed Consent: I have reviewed the patients History and Physical, chart, labs and discussed the procedure including the risks, benefits and alternatives for the proposed anesthesia with the patient or authorized representative who has indicated his/her understanding and acceptance.   Dental  advisory given  Plan Discussed with: CRNA  Anesthesia Plan Comments: (Discuss with surgeon oral tube vs LMA.)        Anesthesia Quick Evaluation

## 2016-10-06 ENCOUNTER — Encounter (HOSPITAL_COMMUNITY)
Admission: RE | Disposition: A | Discharge status: Home / Self Care | Payer: Self-pay | Source: Ambulatory Visit | Attending: Vascular Surgery

## 2016-10-06 ENCOUNTER — Ambulatory Visit (HOSPITAL_COMMUNITY): Payer: BLUE CROSS/BLUE SHIELD | Admitting: Anesthesiology

## 2016-10-06 ENCOUNTER — Encounter (HOSPITAL_COMMUNITY): Payer: Self-pay | Admitting: Urology

## 2016-10-06 ENCOUNTER — Ambulatory Visit (HOSPITAL_COMMUNITY)
Admission: RE | Admit: 2016-10-06 | Discharge: 2016-10-06 | Disposition: A | Discharge status: Home / Self Care | Payer: BLUE CROSS/BLUE SHIELD | Source: Ambulatory Visit | Attending: Vascular Surgery | Admitting: Vascular Surgery

## 2016-10-06 ENCOUNTER — Telehealth: Payer: Self-pay | Admitting: Vascular Surgery

## 2016-10-06 DIAGNOSIS — I120 Hypertensive chronic kidney disease with stage 5 chronic kidney disease or end stage renal disease: Secondary | ICD-10-CM | POA: Diagnosis not present

## 2016-10-06 DIAGNOSIS — Z87891 Personal history of nicotine dependence: Secondary | ICD-10-CM | POA: Diagnosis not present

## 2016-10-06 DIAGNOSIS — Z888 Allergy status to other drugs, medicaments and biological substances status: Secondary | ICD-10-CM | POA: Insufficient documentation

## 2016-10-06 DIAGNOSIS — G4733 Obstructive sleep apnea (adult) (pediatric): Secondary | ICD-10-CM | POA: Insufficient documentation

## 2016-10-06 DIAGNOSIS — F329 Major depressive disorder, single episode, unspecified: Secondary | ICD-10-CM | POA: Diagnosis not present

## 2016-10-06 DIAGNOSIS — N189 Chronic kidney disease, unspecified: Secondary | ICD-10-CM | POA: Diagnosis not present

## 2016-10-06 DIAGNOSIS — N185 Chronic kidney disease, stage 5: Secondary | ICD-10-CM | POA: Insufficient documentation

## 2016-10-06 DIAGNOSIS — Z79899 Other long term (current) drug therapy: Secondary | ICD-10-CM | POA: Insufficient documentation

## 2016-10-06 DIAGNOSIS — K219 Gastro-esophageal reflux disease without esophagitis: Secondary | ICD-10-CM | POA: Diagnosis not present

## 2016-10-06 HISTORY — DX: Accidental discharge from unspecified firearms or gun, initial encounter: W34.00XA

## 2016-10-06 HISTORY — PX: AV FISTULA PLACEMENT: SHX1204

## 2016-10-06 LAB — POCT I-STAT 4, (NA,K, GLUC, HGB,HCT)
Glucose, Bld: 84 mg/dL (ref 65–99)
HCT: 31 % — ABNORMAL LOW (ref 36.0–46.0)
Hemoglobin: 10.5 g/dL — ABNORMAL LOW (ref 12.0–15.0)
Potassium: 4.9 mmol/L (ref 3.5–5.1)
Sodium: 143 mmol/L (ref 135–145)

## 2016-10-06 SURGERY — ARTERIOVENOUS (AV) FISTULA CREATION
Anesthesia: Monitor Anesthesia Care | Site: Arm Lower | Laterality: Left

## 2016-10-06 MED ORDER — LIDOCAINE-EPINEPHRINE (PF) 1 %-1:200000 IJ SOLN
INTRAMUSCULAR | Status: AC
  Filled 2016-10-06: qty 30

## 2016-10-06 MED ORDER — OXYCODONE-ACETAMINOPHEN 5-325 MG PO TABS
1.0000 | ORAL_TABLET | Freq: Once | ORAL | Status: AC
  Administered 2016-10-06: 1 via ORAL

## 2016-10-06 MED ORDER — OXYCODONE-ACETAMINOPHEN 5-325 MG PO TABS: 1.0000 | ORAL_TABLET | Freq: Four times a day (QID) | ORAL | 0 refills | Status: DC | PRN

## 2016-10-06 MED ORDER — MEPERIDINE HCL 25 MG/ML IJ SOLN: 6.2500 mg | INTRAMUSCULAR | Status: DC | PRN

## 2016-10-06 MED ORDER — DEXTROSE 5 % IV SOLN
1.5000 g | INTRAVENOUS | Status: AC
  Administered 2016-10-06: 1.5 g via INTRAVENOUS
  Filled 2016-10-06: qty 1.5

## 2016-10-06 MED ORDER — SODIUM CHLORIDE 0.9 % IV SOLN
INTRAVENOUS | Status: DC
  Administered 2016-10-06: 10:00:00 via INTRAVENOUS

## 2016-10-06 MED ORDER — FENTANYL CITRATE (PF) 250 MCG/5ML IJ SOLN
INTRAMUSCULAR | Status: AC
  Filled 2016-10-06: qty 5

## 2016-10-06 MED ORDER — MIDAZOLAM HCL 2 MG/2ML IJ SOLN
INTRAMUSCULAR | Status: AC
  Filled 2016-10-06: qty 2

## 2016-10-06 MED ORDER — HEMOSTATIC AGENTS (NO CHARGE) OPTIME
TOPICAL | Status: DC | PRN
  Administered 2016-10-06: 1 via TOPICAL

## 2016-10-06 MED ORDER — MIDAZOLAM HCL 5 MG/5ML IJ SOLN
INTRAMUSCULAR | Status: DC | PRN
  Administered 2016-10-06: 2 mg via INTRAVENOUS

## 2016-10-06 MED ORDER — OXYCODONE-ACETAMINOPHEN 5-325 MG PO TABS
ORAL_TABLET | ORAL | Status: AC
  Filled 2016-10-06: qty 1

## 2016-10-06 MED ORDER — FENTANYL CITRATE (PF) 100 MCG/2ML IJ SOLN: 25.0000 ug | INTRAMUSCULAR | Status: DC | PRN

## 2016-10-06 MED ORDER — PROPOFOL 10 MG/ML IV BOLUS
INTRAVENOUS | Status: AC
  Filled 2016-10-06: qty 20

## 2016-10-06 MED ORDER — FENTANYL CITRATE (PF) 100 MCG/2ML IJ SOLN
INTRAMUSCULAR | Status: DC | PRN
  Administered 2016-10-06: 50 ug via INTRAVENOUS

## 2016-10-06 MED ORDER — CHLORHEXIDINE GLUCONATE 4 % EX LIQD: 60.0000 mL | Freq: Once | CUTANEOUS | Status: DC

## 2016-10-06 MED ORDER — SODIUM CHLORIDE 0.9 % IV SOLN
INTRAVENOUS | Status: DC | PRN
  Administered 2016-10-06: 10:00:00

## 2016-10-06 MED ORDER — ONDANSETRON HCL 4 MG/2ML IJ SOLN: 4.0000 mg | Freq: Once | INTRAMUSCULAR | Status: DC | PRN

## 2016-10-06 MED ORDER — LIDOCAINE-EPINEPHRINE (PF) 1 %-1:200000 IJ SOLN
INTRAMUSCULAR | Status: DC | PRN
  Administered 2016-10-06: 14 mL

## 2016-10-06 MED ORDER — DIPHENHYDRAMINE HCL 50 MG/ML IJ SOLN
INTRAMUSCULAR | Status: DC | PRN
  Administered 2016-10-06: 25 mg via INTRAVENOUS

## 2016-10-06 MED ORDER — PROPOFOL 500 MG/50ML IV EMUL
INTRAVENOUS | Status: DC | PRN
  Administered 2016-10-06: 100 ug/kg/min via INTRAVENOUS

## 2016-10-06 MED ORDER — 0.9 % SODIUM CHLORIDE (POUR BTL) OPTIME
TOPICAL | Status: DC | PRN
  Administered 2016-10-06: 1000 mL

## 2016-10-06 SURGICAL SUPPLY — 37 items
ADH SKN CLS APL DERMABOND .7 (GAUZE/BANDAGES/DRESSINGS) ×1
ARMBAND PINK RESTRICT EXTREMIT (MISCELLANEOUS) ×2 IMPLANT
CANISTER SUCT 3000ML PPV (MISCELLANEOUS) ×2 IMPLANT
CLIP VESOCCLUDE MED 6/CT (CLIP) ×2 IMPLANT
CLIP VESOCCLUDE SM WIDE 6/CT (CLIP) ×2 IMPLANT
COVER PROBE W GEL 5X96 (DRAPES) ×1 IMPLANT
DERMABOND ADVANCED (GAUZE/BANDAGES/DRESSINGS) ×1
DERMABOND ADVANCED .7 DNX12 (GAUZE/BANDAGES/DRESSINGS) ×1 IMPLANT
ELECT REM PT RETURN 9FT ADLT (ELECTROSURGICAL) ×2
ELECTRODE REM PT RTRN 9FT ADLT (ELECTROSURGICAL) ×1 IMPLANT
GLOVE BIO SURGEON STRL SZ7.5 (GLOVE) ×2 IMPLANT
GLOVE BIOGEL M 6.5 STRL (GLOVE) ×1 IMPLANT
GLOVE BIOGEL M 7.0 STRL (GLOVE) ×1 IMPLANT
GLOVE BIOGEL M STER SZ 6 (GLOVE) ×1 IMPLANT
GLOVE BIOGEL PI IND STRL 6 (GLOVE) IMPLANT
GLOVE BIOGEL PI IND STRL 6.5 (GLOVE) IMPLANT
GLOVE BIOGEL PI IND STRL 7.0 (GLOVE) IMPLANT
GLOVE BIOGEL PI INDICATOR 6 (GLOVE) ×1
GLOVE BIOGEL PI INDICATOR 6.5 (GLOVE) ×1
GLOVE BIOGEL PI INDICATOR 7.0 (GLOVE) ×1
GOWN DECONTAM LG NS DISP (GOWN DISPOSABLE) ×1 IMPLANT
GOWN STRL REUS W/ TWL LRG LVL3 (GOWN DISPOSABLE) ×2 IMPLANT
GOWN STRL REUS W/ TWL XL LVL3 (GOWN DISPOSABLE) ×1 IMPLANT
GOWN STRL REUS W/TWL LRG LVL3 (GOWN DISPOSABLE) ×2
GOWN STRL REUS W/TWL XL LVL3 (GOWN DISPOSABLE) ×2
HEMOSTAT SNOW SURGICEL 2X4 (HEMOSTASIS) ×1 IMPLANT
KIT BASIN OR (CUSTOM PROCEDURE TRAY) ×2 IMPLANT
KIT ROOM TURNOVER OR (KITS) ×2 IMPLANT
NS IRRIG 1000ML POUR BTL (IV SOLUTION) ×2 IMPLANT
PACK CV ACCESS (CUSTOM PROCEDURE TRAY) ×2 IMPLANT
PAD ARMBOARD 7.5X6 YLW CONV (MISCELLANEOUS) ×4 IMPLANT
SUT MNCRL AB 4-0 PS2 18 (SUTURE) ×2 IMPLANT
SUT PROLENE 6 0 BV (SUTURE) ×2 IMPLANT
SUT VIC AB 3-0 SH 27 (SUTURE) ×2
SUT VIC AB 3-0 SH 27X BRD (SUTURE) ×1 IMPLANT
UNDERPAD 30X30 (UNDERPADS AND DIAPERS) ×2 IMPLANT
WATER STERILE IRR 1000ML POUR (IV SOLUTION) ×2 IMPLANT

## 2016-10-06 NOTE — Anesthesia Postprocedure Evaluation (Signed)
Anesthesia Post Note  Patient: Kristen Moreno  Procedure(s) Performed: Procedure(s) (LRB): ARTERIOVENOUS (AV) FISTULA CREATION LEFT ARM (Left)     Patient location during evaluation: PACU Anesthesia Type: MAC Level of consciousness: awake and alert Pain management: pain level controlled Vital Signs Assessment: post-procedure vital signs reviewed and stable Respiratory status: spontaneous breathing, nonlabored ventilation, respiratory function stable and patient connected to nasal cannula oxygen Cardiovascular status: stable and blood pressure returned to baseline Postop Assessment: no apparent nausea or vomiting Anesthetic complications: no    Last Vitals:  Vitals:   10/06/16 0822 10/06/16 1110  BP: (!) 167/83 (!) 162/90  Pulse: 74 77  Resp: 18 17  Temp: 36.7 C (!) 36.4 C  SpO2: 100% 100%    Last Pain:  Vitals:   10/06/16 1110  TempSrc:   PainSc: Asleep                 Kenisha Lynds

## 2016-10-06 NOTE — Telephone Encounter (Signed)
-----   Message from Mena Goes, RN sent at 10/06/2016 11:24 AM EDT ----- Regarding: 6-8 weeks   ----- Message ----- From: Waynetta Sandy, MD Sent: 10/06/2016  11:04 AM To: [facility] [employer] [id number] 1960-08-04  10/06/2016 Pre-operative Diagnosis: ckd  Surgeon:  Erlene Quan C. Donzetta Matters, MD Assistant: OR nurse  Procedure Performed: Left brachiocephalic avf  F/u in 6-8 weeks with left arm dialysis access duplex.

## 2016-10-06 NOTE — Op Note (Signed)
    Patient name: Ashelyn Mccravy MRN: [id number] DOB: 1960/11/23 Sex: female  10/06/2016 Pre-operative Diagnosis: ckd Post-operative diagnosis:  Same Surgeon:  Erlene Quan C. Donzetta Matters, MD Assistant: OR nurse Procedure Performed: Left brachiocephalic avf  Indications:  75 female with chronic kidney disease and decreasing GFR now indicated for dialysis access.  Findings: Brachial artery otherwise had an internal diameter 4 mm was soft without disease. Cephalic vein was actually diminutive in the antecubital fossa but dilated to 4 mm at completion there was a strong thrill the runoff vein and palpable pulse at the radial artery at the wrist.    Procedure:  The patient was identified in the holding area and taken to The operating room wishes placed supine on the operating table Mac anesthesia induced. She was sterilely prepped and draped in the left arm for access procedure given antibiotics and timeout called. We began with instilling 10 mL 1% lidocaine with epinephrine under the skin. We then made a transverse incision dissected out first identified our cephalic vein dissected out for several centimeters marked for orientation. We then divided the deep fascia to expose the brachial artery and placed a vessel loop around this. The vein was then transected distally dilated to 4 mm spatulated and flushed with heparinized saline and clamped. Brachial artery was then clamped distally and proximally opened longitudinally. Heparinized saline was flushed in both directions. We then sewed the vein end-to-side with 6-0 Prolene suture. Prior to completing our anastomosis we did allow flushing maneuvers in both directions. Upon completion we had a palpable thrill in runoff vein palpable radial artery pulse the risks which was confirmed with Doppler. Satisfied we obtained hemostasis the wound closed in 2 layers with 3-0 Vicryl for Monocryl. Patient tolerated procedure well without immediate complication.   Kosisochukwu Burningham C.  Donzetta Matters, MD [facility] [place] Office: [phone] Pager: [phone]

## 2016-10-06 NOTE — H&P (Signed)
   History and Physical Update  The patient was interviewed and re-examined.  The patient's previous History and Physical has been reviewed and is unchanged from office visit. Plan left arm avf today. Kylea Berrong C. Donzetta Matters, MD [facility] [place] Office: [phone] Pager: [phone]   10/06/2016, 9:06 AM

## 2016-10-06 NOTE — Anesthesia Procedure Notes (Signed)
Procedure Name: MAC Performed by: Daisy Lites B Pre-anesthesia Checklist: Patient identified, Emergency Drugs available, Suction available, Patient being monitored and Timeout performed Patient Re-evaluated:Patient Re-evaluated prior to inductionOxygen Delivery Method: Simple face mask Placement Confirmation: positive ETCO2 Dental Injury: Teeth and Oropharynx as per pre-operative assessment        

## 2016-10-06 NOTE — Anesthesia Preprocedure Evaluation (Addendum)
Anesthesia Evaluation  Patient identified by MRN, date of birth, ID band Patient awake    Reviewed: Allergy & Precautions, H&P , NPO status , Patient's Chart, lab work & pertinent test results  Airway Mallampati: II  TM Distance: >3 FB Neck ROM: Full    Dental no notable dental hx. (+) Teeth Intact, Dental Advisory Given   Pulmonary neg pulmonary ROS, pneumonia, resolved, Recent URI , Resolved, Current Smoker, former smoker,    Pulmonary exam normal breath sounds clear to auscultation       Cardiovascular hypertension, Pt. on medications  Rhythm:Regular Rate:Normal     Neuro/Psych  Headaches, Depression    GI/Hepatic Neg liver ROS, GERD  Medicated,  Endo/Other  negative endocrine ROS  Renal/GU Renal InsufficiencyRenal diseasePolycystic renal disease. Bladder dysfunction  SUI    Musculoskeletal negative musculoskeletal ROS (+)   Abdominal   Peds  Hematology negative hematology ROS (+)   Anesthesia Other Findings   Reproductive/Obstetrics Uterine Fibroids                             Anesthesia Physical  Anesthesia Plan  ASA: III  Anesthesia Plan: MAC   Post-op Pain Management:    Induction: Intravenous  PONV Risk Score and Plan: 2 and Ondansetron, Dexamethasone, Treatment may vary due to age or medical condition and Midazolam  Airway Management Planned: Natural Airway, Nasal Cannula and Simple Face Mask  Additional Equipment:   Intra-op Plan:   Post-operative Plan: Extubation in OR  Informed Consent: I have reviewed the patients History and Physical, chart, labs and discussed the procedure including the risks, benefits and alternatives for the proposed anesthesia with the patient or authorized representative who has indicated his/her understanding and acceptance.   Dental advisory given  Plan Discussed with: CRNA  Anesthesia Plan Comments:        Anesthesia Quick  Evaluation

## 2016-10-06 NOTE — Telephone Encounter (Signed)
Sched appt 11/21/16; lab at 10:00 and MD at 10:45. Lm on cell#.

## 2016-10-06 NOTE — Transfer of Care (Signed)
Immediate Anesthesia Transfer of Care Note  Patient: Kristen Moreno  Procedure(s) Performed: Procedure(s): ARTERIOVENOUS (AV) FISTULA CREATION LEFT ARM (Left)  Patient Location: PACU  Anesthesia Type:MAC  Level of Consciousness: awake and alert   Airway & Oxygen Therapy: Patient Spontanous Breathing  Post-op Assessment: Report given to RN and Post -op Vital signs reviewed and stable  Post vital signs: Reviewed and stable  Last Vitals:  Vitals:   10/06/16 0822  BP: (!) 167/83  Pulse: 74  Resp: 18  Temp: 36.7 C  SpO2: 100%    Last Pain:  Vitals:   10/06/16 0822  TempSrc: Oral  PainSc:       Patients Stated Pain Goal: 3 (33/00/76 2263)  Complications: No apparent anesthesia complications

## 2016-10-07 ENCOUNTER — Encounter (HOSPITAL_COMMUNITY): Payer: Self-pay | Admitting: Vascular Surgery

## 2016-10-17 ENCOUNTER — Emergency Department (HOSPITAL_COMMUNITY)
Admission: EM | Admit: 2016-10-17 | Discharge: 2016-10-17 | Disposition: A | Discharge status: Home / Self Care | Payer: BLUE CROSS/BLUE SHIELD | Attending: Emergency Medicine | Admitting: Emergency Medicine

## 2016-10-17 ENCOUNTER — Encounter (HOSPITAL_COMMUNITY): Payer: Self-pay | Admitting: *Deleted

## 2016-10-17 DIAGNOSIS — E875 Hyperkalemia: Secondary | ICD-10-CM | POA: Diagnosis not present

## 2016-10-17 DIAGNOSIS — I129 Hypertensive chronic kidney disease with stage 1 through stage 4 chronic kidney disease, or unspecified chronic kidney disease: Secondary | ICD-10-CM | POA: Diagnosis not present

## 2016-10-17 DIAGNOSIS — N184 Chronic kidney disease, stage 4 (severe): Secondary | ICD-10-CM

## 2016-10-17 DIAGNOSIS — Z79899 Other long term (current) drug therapy: Secondary | ICD-10-CM | POA: Diagnosis not present

## 2016-10-17 DIAGNOSIS — T6591XA Toxic effect of unspecified substance, accidental (unintentional), initial encounter: Secondary | ICD-10-CM | POA: Insufficient documentation

## 2016-10-17 DIAGNOSIS — Z87891 Personal history of nicotine dependence: Secondary | ICD-10-CM | POA: Insufficient documentation

## 2016-10-17 DIAGNOSIS — T65811A Toxic effect of latex, accidental (unintentional), initial encounter: Secondary | ICD-10-CM | POA: Diagnosis not present

## 2016-10-17 LAB — BASIC METABOLIC PANEL
Anion gap: 8 (ref 5–15)
BUN: 44 mg/dL — ABNORMAL HIGH (ref 6–20)
CO2: 18 mmol/L — ABNORMAL LOW (ref 22–32)
Calcium: 9.1 mg/dL (ref 8.9–10.3)
Chloride: 113 mmol/L — ABNORMAL HIGH (ref 101–111)
Creatinine, Ser: 4.28 mg/dL — ABNORMAL HIGH (ref 0.44–1.00)
GFR calc Af Amer: 13 mL/min — ABNORMAL LOW (ref >60)
GFR calc non Af Amer: 11 mL/min — ABNORMAL LOW (ref >60)
Glucose, Bld: 90 mg/dL (ref 65–99)
Potassium: 5.2 mmol/L — ABNORMAL HIGH (ref 3.5–5.1)
Sodium: 139 mmol/L (ref 135–145)

## 2016-10-17 MED ORDER — ONDANSETRON HCL 4 MG/2ML IJ SOLN
4.0000 mg | Freq: Once | INTRAMUSCULAR | Status: AC
  Administered 2016-10-17: 4 mg via INTRAVENOUS
  Filled 2016-10-17: qty 2

## 2016-10-17 MED ORDER — SODIUM CHLORIDE 0.9 % IV BOLUS (SEPSIS)
500.0000 mL | Freq: Once | INTRAVENOUS | Status: AC
  Administered 2016-10-17: 500 mL via INTRAVENOUS

## 2016-10-17 MED ORDER — SODIUM POLYSTYRENE SULFONATE 15 GM/60ML PO SUSP
30.0000 g | Freq: Once | ORAL | Status: AC
  Administered 2016-10-17: 30 g via ORAL
  Filled 2016-10-17: qty 120

## 2016-10-17 NOTE — ED Triage Notes (Signed)
PT states she takes reflux pills and she reached in pocket and thought it was one of those and when she put it in and swallowed she realized it was not.  She had ingested a toilet tank leak detecting tablet at 1000. states it is non-toxic if swallowed, but she has been nauseated and vomiting since.  She has kidney disease and her kidney doctors told her to come to the ED since those tablets have a lot of chemicals in them. Frost: The center considers this a non corrosive substance and the nausea/vomiting is related to irritant.  They do not feel this should affect her kidneys and recommend Electrolyte Panel, IV fluids or PO fluids if she can tolerate, and an anti-emetic

## 2016-10-17 NOTE — Discharge Instructions (Addendum)
Your potassium was slightly high, we ask you to get it checked again in 3-7 days.
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
## 2016-10-17 NOTE — ED Notes (Signed)
Poison control called to determine if labs had been ordered. Phlebotomy will collect them.

## 2016-10-17 NOTE — ED Notes (Signed)
Pt given ginger ale per RN.  °

## 2016-10-29 DIAGNOSIS — N819 Female genital prolapse, unspecified: Secondary | ICD-10-CM | POA: Insufficient documentation

## 2016-10-29 DIAGNOSIS — N8111 Cystocele, midline: Secondary | ICD-10-CM | POA: Insufficient documentation

## 2016-10-29 DIAGNOSIS — N3946 Mixed incontinence: Secondary | ICD-10-CM | POA: Diagnosis not present

## 2016-10-29 DIAGNOSIS — N8110 Cystocele, unspecified: Secondary | ICD-10-CM | POA: Diagnosis not present

## 2016-10-29 DIAGNOSIS — Z87440 Personal history of urinary (tract) infections: Secondary | ICD-10-CM | POA: Diagnosis not present

## 2016-10-30 DIAGNOSIS — Z23 Encounter for immunization: Secondary | ICD-10-CM | POA: Diagnosis not present

## 2016-10-30 DIAGNOSIS — N2581 Secondary hyperparathyroidism of renal origin: Secondary | ICD-10-CM | POA: Diagnosis not present

## 2016-10-30 DIAGNOSIS — I120 Hypertensive chronic kidney disease with stage 5 chronic kidney disease or end stage renal disease: Secondary | ICD-10-CM | POA: Diagnosis not present

## 2016-10-30 DIAGNOSIS — D631 Anemia in chronic kidney disease: Secondary | ICD-10-CM | POA: Diagnosis not present

## 2016-10-30 DIAGNOSIS — N185 Chronic kidney disease, stage 5: Secondary | ICD-10-CM | POA: Diagnosis not present

## 2016-10-30 DIAGNOSIS — N184 Chronic kidney disease, stage 4 (severe): Secondary | ICD-10-CM | POA: Diagnosis not present

## 2016-11-17 ENCOUNTER — Other Ambulatory Visit: Payer: Self-pay

## 2016-11-17 DIAGNOSIS — N186 End stage renal disease: Secondary | ICD-10-CM

## 2016-11-17 DIAGNOSIS — Z992 Dependence on renal dialysis: Secondary | ICD-10-CM

## 2016-11-17 DIAGNOSIS — Z48812 Encounter for surgical aftercare following surgery on the circulatory system: Secondary | ICD-10-CM

## 2016-11-18 DIAGNOSIS — R109 Unspecified abdominal pain: Secondary | ICD-10-CM | POA: Diagnosis not present

## 2016-11-21 ENCOUNTER — Ambulatory Visit (HOSPITAL_COMMUNITY)
Admit: 2016-11-21 | Discharge: 2016-11-21 | Disposition: A | Discharge status: Home / Self Care | Payer: BLUE CROSS/BLUE SHIELD | Attending: Vascular Surgery | Admitting: Vascular Surgery

## 2016-11-21 ENCOUNTER — Ambulatory Visit (INDEPENDENT_AMBULATORY_CARE_PROVIDER_SITE_OTHER): Payer: Self-pay | Admitting: Vascular Surgery

## 2016-11-21 VITALS — BP 176/96 | HR 74 | Temp 97.0°F | Resp 16 | Ht 66.0 in | Wt 184.0 lb

## 2016-11-21 DIAGNOSIS — Z48812 Encounter for surgical aftercare following surgery on the circulatory system: Secondary | ICD-10-CM | POA: Diagnosis not present

## 2016-11-21 DIAGNOSIS — N186 End stage renal disease: Secondary | ICD-10-CM

## 2016-11-21 DIAGNOSIS — Z992 Dependence on renal dialysis: Secondary | ICD-10-CM | POA: Diagnosis not present

## 2016-11-21 DIAGNOSIS — N184 Chronic kidney disease, stage 4 (severe): Secondary | ICD-10-CM

## 2016-11-21 NOTE — Progress Notes (Signed)
Subjective:     Patient ID: Kristen Moreno, female   DOB: 28-Apr-1960, 56 y.o.   MRN: [id number]  HPI 56 year old female with chronic kidney disease follows up after placement of left upper arm AV fistula.  She has not felt a thrill since immediately after the surgery and she is sent here for evaluation.  She has no hand or upper extremity symptoms.  She has not started dialysis and is being considered for peritoneal dialysis.   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  She is not having any symptoms.  I discussed with Dr. Posey Pronto and at this time she is being considered for peritoneal dialysis.  Should she need further access he would need to be a graft in her left upper extremity and she can follow-up with [place] at that time.  She will other wise follow-up on a as needed basis.  Kristen Moreno C. Donzetta Matters, MD [facility] Office: [phone] Pager: [phone]

## 2016-11-26 DIAGNOSIS — Z87891 Personal history of nicotine dependence: Secondary | ICD-10-CM | POA: Diagnosis not present

## 2016-11-26 DIAGNOSIS — Z888 Allergy status to other drugs, medicaments and biological substances status: Secondary | ICD-10-CM | POA: Diagnosis not present

## 2016-11-26 DIAGNOSIS — Z79899 Other long term (current) drug therapy: Secondary | ICD-10-CM | POA: Diagnosis not present

## 2016-11-26 DIAGNOSIS — I770 Arteriovenous fistula, acquired: Secondary | ICD-10-CM | POA: Diagnosis not present

## 2016-11-26 DIAGNOSIS — Q613 Polycystic kidney, unspecified: Secondary | ICD-10-CM | POA: Diagnosis not present

## 2016-11-26 DIAGNOSIS — E785 Hyperlipidemia, unspecified: Secondary | ICD-10-CM | POA: Diagnosis not present

## 2016-11-26 DIAGNOSIS — I120 Hypertensive chronic kidney disease with stage 5 chronic kidney disease or end stage renal disease: Secondary | ICD-10-CM | POA: Diagnosis not present

## 2016-11-26 DIAGNOSIS — Z01818 Encounter for other preprocedural examination: Secondary | ICD-10-CM | POA: Diagnosis not present

## 2016-11-26 DIAGNOSIS — N185 Chronic kidney disease, stage 5: Secondary | ICD-10-CM | POA: Diagnosis not present

## 2016-12-04 ENCOUNTER — Ambulatory Visit: Admit: 2016-12-04 | Payer: BLUE CROSS/BLUE SHIELD | Admitting: Obstetrics & Gynecology

## 2016-12-04 SURGERY — COLPORRHAPHY, ANTERIOR, FOR CYSTOCELE REPAIR
Anesthesia: Choice

## 2016-12-09 DIAGNOSIS — Q612 Polycystic kidney, adult type: Secondary | ICD-10-CM | POA: Diagnosis not present

## 2016-12-09 DIAGNOSIS — B9620 Unspecified Escherichia coli [E. coli] as the cause of diseases classified elsewhere: Secondary | ICD-10-CM | POA: Diagnosis not present

## 2016-12-09 DIAGNOSIS — N3020 Other chronic cystitis without hematuria: Secondary | ICD-10-CM | POA: Diagnosis not present

## 2016-12-09 DIAGNOSIS — N390 Urinary tract infection, site not specified: Secondary | ICD-10-CM | POA: Diagnosis not present

## 2016-12-17 DIAGNOSIS — N3946 Mixed incontinence: Secondary | ICD-10-CM | POA: Diagnosis not present

## 2016-12-17 DIAGNOSIS — I120 Hypertensive chronic kidney disease with stage 5 chronic kidney disease or end stage renal disease: Secondary | ICD-10-CM | POA: Diagnosis not present

## 2016-12-17 DIAGNOSIS — N813 Complete uterovaginal prolapse: Secondary | ICD-10-CM | POA: Diagnosis not present

## 2016-12-17 DIAGNOSIS — Q613 Polycystic kidney, unspecified: Secondary | ICD-10-CM | POA: Diagnosis not present

## 2016-12-23 ENCOUNTER — Encounter: Payer: Self-pay | Admitting: *Deleted

## 2016-12-31 DIAGNOSIS — N2581 Secondary hyperparathyroidism of renal origin: Secondary | ICD-10-CM | POA: Diagnosis not present

## 2016-12-31 DIAGNOSIS — N185 Chronic kidney disease, stage 5: Secondary | ICD-10-CM | POA: Diagnosis not present

## 2016-12-31 DIAGNOSIS — D631 Anemia in chronic kidney disease: Secondary | ICD-10-CM | POA: Diagnosis not present

## 2016-12-31 DIAGNOSIS — I120 Hypertensive chronic kidney disease with stage 5 chronic kidney disease or end stage renal disease: Secondary | ICD-10-CM | POA: Diagnosis not present

## 2017-01-02 DIAGNOSIS — G4733 Obstructive sleep apnea (adult) (pediatric): Secondary | ICD-10-CM | POA: Diagnosis not present

## 2017-01-02 DIAGNOSIS — N8110 Cystocele, unspecified: Secondary | ICD-10-CM | POA: Diagnosis not present

## 2017-01-02 DIAGNOSIS — N186 End stage renal disease: Secondary | ICD-10-CM | POA: Diagnosis not present

## 2017-01-02 DIAGNOSIS — Z7682 Awaiting organ transplant status: Secondary | ICD-10-CM | POA: Diagnosis not present

## 2017-01-02 DIAGNOSIS — Z87440 Personal history of urinary (tract) infections: Secondary | ICD-10-CM | POA: Diagnosis not present

## 2017-01-02 DIAGNOSIS — N3946 Mixed incontinence: Secondary | ICD-10-CM | POA: Diagnosis not present

## 2017-01-02 DIAGNOSIS — K219 Gastro-esophageal reflux disease without esophagitis: Secondary | ICD-10-CM | POA: Diagnosis not present

## 2017-01-02 DIAGNOSIS — N819 Female genital prolapse, unspecified: Secondary | ICD-10-CM | POA: Diagnosis not present

## 2017-01-02 DIAGNOSIS — G8929 Other chronic pain: Secondary | ICD-10-CM | POA: Diagnosis not present

## 2017-01-02 DIAGNOSIS — I120 Hypertensive chronic kidney disease with stage 5 chronic kidney disease or end stage renal disease: Secondary | ICD-10-CM | POA: Diagnosis not present

## 2017-01-02 DIAGNOSIS — N816 Rectocele: Secondary | ICD-10-CM | POA: Diagnosis not present

## 2017-01-02 DIAGNOSIS — N8112 Cystocele, lateral: Secondary | ICD-10-CM | POA: Diagnosis not present

## 2017-01-02 DIAGNOSIS — Z9119 Patient's noncompliance with other medical treatment and regimen: Secondary | ICD-10-CM | POA: Diagnosis not present

## 2017-01-02 DIAGNOSIS — Z9889 Other specified postprocedural states: Secondary | ICD-10-CM | POA: Diagnosis not present

## 2017-01-02 DIAGNOSIS — Z87891 Personal history of nicotine dependence: Secondary | ICD-10-CM | POA: Diagnosis not present

## 2017-01-02 HISTORY — PX: RECTOCELE REPAIR: SHX761

## 2017-01-02 HISTORY — PX: CYSTOCELE REPAIR: SHX163

## 2017-01-02 HISTORY — PX: VAGINAL PROLAPSE REPAIR: SHX830

## 2017-01-03 DIAGNOSIS — Z9119 Patient's noncompliance with other medical treatment and regimen: Secondary | ICD-10-CM | POA: Diagnosis not present

## 2017-01-03 DIAGNOSIS — Z9889 Other specified postprocedural states: Secondary | ICD-10-CM | POA: Diagnosis not present

## 2017-01-03 DIAGNOSIS — N819 Female genital prolapse, unspecified: Secondary | ICD-10-CM | POA: Diagnosis not present

## 2017-01-03 DIAGNOSIS — N186 End stage renal disease: Secondary | ICD-10-CM | POA: Diagnosis not present

## 2017-01-03 DIAGNOSIS — N816 Rectocele: Secondary | ICD-10-CM | POA: Diagnosis not present

## 2017-01-03 DIAGNOSIS — N8110 Cystocele, unspecified: Secondary | ICD-10-CM | POA: Diagnosis not present

## 2017-01-03 DIAGNOSIS — G8929 Other chronic pain: Secondary | ICD-10-CM | POA: Diagnosis not present

## 2017-01-03 DIAGNOSIS — Z7682 Awaiting organ transplant status: Secondary | ICD-10-CM | POA: Diagnosis not present

## 2017-01-03 DIAGNOSIS — I120 Hypertensive chronic kidney disease with stage 5 chronic kidney disease or end stage renal disease: Secondary | ICD-10-CM | POA: Diagnosis not present

## 2017-01-03 DIAGNOSIS — Z87440 Personal history of urinary (tract) infections: Secondary | ICD-10-CM | POA: Diagnosis not present

## 2017-01-03 DIAGNOSIS — Z87891 Personal history of nicotine dependence: Secondary | ICD-10-CM | POA: Diagnosis not present

## 2017-01-03 DIAGNOSIS — K219 Gastro-esophageal reflux disease without esophagitis: Secondary | ICD-10-CM | POA: Diagnosis not present

## 2017-01-03 DIAGNOSIS — N3946 Mixed incontinence: Secondary | ICD-10-CM | POA: Diagnosis not present

## 2017-01-03 DIAGNOSIS — G4733 Obstructive sleep apnea (adult) (pediatric): Secondary | ICD-10-CM | POA: Diagnosis not present

## 2017-01-05 DIAGNOSIS — R338 Other retention of urine: Secondary | ICD-10-CM | POA: Diagnosis not present

## 2017-01-05 DIAGNOSIS — N9989 Other postprocedural complications and disorders of genitourinary system: Secondary | ICD-10-CM | POA: Diagnosis not present

## 2017-01-07 DIAGNOSIS — R338 Other retention of urine: Secondary | ICD-10-CM | POA: Diagnosis not present

## 2017-01-07 DIAGNOSIS — N9989 Other postprocedural complications and disorders of genitourinary system: Secondary | ICD-10-CM | POA: Diagnosis not present

## 2017-01-07 DIAGNOSIS — N8111 Cystocele, midline: Secondary | ICD-10-CM | POA: Diagnosis not present

## 2017-01-07 DIAGNOSIS — N819 Female genital prolapse, unspecified: Secondary | ICD-10-CM | POA: Diagnosis not present

## 2017-01-08 DIAGNOSIS — R339 Retention of urine, unspecified: Secondary | ICD-10-CM | POA: Diagnosis not present

## 2017-01-22 ENCOUNTER — Other Ambulatory Visit: Payer: Self-pay | Admitting: *Deleted

## 2017-01-22 MED ORDER — AMLODIPINE BESYLATE 10 MG PO TABS: 10.0000 mg | ORAL_TABLET | Freq: Every day | ORAL | 2 refills | Status: DC

## 2017-02-11 ENCOUNTER — Other Ambulatory Visit: Payer: Self-pay

## 2017-02-11 ENCOUNTER — Encounter: Payer: Self-pay | Admitting: Internal Medicine

## 2017-02-11 ENCOUNTER — Ambulatory Visit: Payer: BLUE CROSS/BLUE SHIELD | Admitting: Internal Medicine

## 2017-02-11 VITALS — BP 144/88 | HR 89 | Temp 98.0°F | Ht 66.0 in | Wt 191.1 lb

## 2017-02-11 DIAGNOSIS — Q613 Polycystic kidney, unspecified: Secondary | ICD-10-CM | POA: Diagnosis not present

## 2017-02-11 DIAGNOSIS — R300 Dysuria: Secondary | ICD-10-CM | POA: Diagnosis not present

## 2017-02-11 DIAGNOSIS — I129 Hypertensive chronic kidney disease with stage 1 through stage 4 chronic kidney disease, or unspecified chronic kidney disease: Secondary | ICD-10-CM | POA: Diagnosis not present

## 2017-02-11 DIAGNOSIS — N390 Urinary tract infection, site not specified: Secondary | ICD-10-CM | POA: Diagnosis not present

## 2017-02-11 DIAGNOSIS — N184 Chronic kidney disease, stage 4 (severe): Secondary | ICD-10-CM | POA: Diagnosis not present

## 2017-02-11 DIAGNOSIS — F17211 Nicotine dependence, cigarettes, in remission: Secondary | ICD-10-CM

## 2017-02-11 DIAGNOSIS — Z87440 Personal history of urinary (tract) infections: Secondary | ICD-10-CM

## 2017-02-11 DIAGNOSIS — I10 Essential (primary) hypertension: Secondary | ICD-10-CM

## 2017-02-11 DIAGNOSIS — E282 Polycystic ovarian syndrome: Secondary | ICD-10-CM

## 2017-02-11 DIAGNOSIS — Z79899 Other long term (current) drug therapy: Secondary | ICD-10-CM

## 2017-02-11 LAB — POCT URINALYSIS DIPSTICK
Bilirubin, UA: NEGATIVE
Glucose, UA: NEGATIVE
Ketones, UA: NEGATIVE
Nitrite, UA: POSITIVE
Protein, UA: 100
Spec Grav, UA: 1.015 (ref 1.010–1.025)
Urobilinogen, UA: 0.2 E.U./dL
pH, UA: 6 (ref 5.0–8.0)

## 2017-02-11 MED ORDER — TRAMADOL HCL 50 MG PO TABS: 50.0000 mg | ORAL_TABLET | Freq: Four times a day (QID) | ORAL | 0 refills | Status: DC | PRN

## 2017-02-11 MED ORDER — PROMETHAZINE HCL 25 MG PO TABS: 25.0000 mg | ORAL_TABLET | Freq: Four times a day (QID) | ORAL | 1 refills | Status: DC | PRN

## 2017-02-11 MED ORDER — CIPROFLOXACIN-CIPROFLOX HCL ER 500 MG PO TB24: 500.0000 mg | ORAL_TABLET | Freq: Every day | ORAL | 0 refills | Status: DC

## 2017-02-11 NOTE — Patient Instructions (Signed)
We'll treat you for a UTI with ciprofloxacin 500mg  once daily for 5 days.  If you have worsening pain, fevers, vomiting please come back either to the clinic or the ED for re-evaluation.
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
## 2017-02-13 ENCOUNTER — Encounter: Payer: Self-pay | Admitting: Internal Medicine

## 2017-02-13 NOTE — Assessment & Plan Note (Signed)
BP mildly elevated; she has trace LE edema but no overt volume overload on exam.  BP elevation likely 2/2 significant discomfort from flank pain.  Plan: --continue amlodipine 10mg  daily

## 2017-02-13 NOTE — Assessment & Plan Note (Signed)
Follows with Dr. Posey Pronto at [facility]. 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 She is in the process of transplant evaluation. There was discussion about peritoneal dialysis when the time came, however per patient it was decided between her and Dr. Posey Pronto that she would do better with traditional HD.

## 2017-02-13 NOTE — Assessment & Plan Note (Addendum)
Patient with s/s of UTI. She did just recently stop in/out cathing, however being almost 2 weeks out from this, I would not expect any trauma from this to still be causing dysuria. Previous urine cultures show resistance to amp, amp/sulbactam, and intermediate resistance to pip/tazo.   POC UA shows small blood, 100 protein, + nitrites, and small leuk  Plan: --cipro xl 500mg  daily based on renal dosing --phenergan for nausea --f/u UCx --advised patient to rtc or go to ED if symptoms  --pt has f/u with uro/gyn next week.  Addendum: Urine culture + E coli, resistant to ampicillin, bactrim; intermediate for augmentin; sensitive for cipro which was prescribed.

## 2017-02-14 LAB — URINE CULTURE

## 2017-02-16 NOTE — Progress Notes (Signed)
Internal Medicine Clinic Attending  Case discussed with Dr. Svalina  at the time of the visit.  We reviewed the resident's history and exam and pertinent patient test results.  I agree with the assessment, diagnosis, and plan of care documented in the resident's note.  

## 2017-02-18 DIAGNOSIS — N3000 Acute cystitis without hematuria: Secondary | ICD-10-CM | POA: Diagnosis not present

## 2017-03-09 DIAGNOSIS — Z87891 Personal history of nicotine dependence: Secondary | ICD-10-CM | POA: Diagnosis not present

## 2017-03-09 DIAGNOSIS — I6522 Occlusion and stenosis of left carotid artery: Secondary | ICD-10-CM | POA: Diagnosis not present

## 2017-03-09 DIAGNOSIS — I6523 Occlusion and stenosis of bilateral carotid arteries: Secondary | ICD-10-CM | POA: Diagnosis not present

## 2017-03-09 DIAGNOSIS — I10 Essential (primary) hypertension: Secondary | ICD-10-CM | POA: Diagnosis not present

## 2017-03-09 DIAGNOSIS — I313 Pericardial effusion (noninflammatory): Secondary | ICD-10-CM | POA: Diagnosis not present

## 2017-03-09 DIAGNOSIS — I071 Rheumatic tricuspid insufficiency: Secondary | ICD-10-CM | POA: Diagnosis not present

## 2017-03-09 DIAGNOSIS — Z01818 Encounter for other preprocedural examination: Secondary | ICD-10-CM | POA: Diagnosis not present

## 2017-03-09 DIAGNOSIS — Q613 Polycystic kidney, unspecified: Secondary | ICD-10-CM | POA: Diagnosis not present

## 2017-03-09 DIAGNOSIS — I120 Hypertensive chronic kidney disease with stage 5 chronic kidney disease or end stage renal disease: Secondary | ICD-10-CM | POA: Diagnosis not present

## 2017-03-09 DIAGNOSIS — N186 End stage renal disease: Secondary | ICD-10-CM | POA: Diagnosis not present

## 2017-03-09 DIAGNOSIS — N185 Chronic kidney disease, stage 5: Secondary | ICD-10-CM | POA: Diagnosis not present

## 2017-03-09 DIAGNOSIS — Z992 Dependence on renal dialysis: Secondary | ICD-10-CM | POA: Diagnosis not present

## 2017-03-26 ENCOUNTER — Other Ambulatory Visit: Payer: Self-pay | Admitting: Internal Medicine

## 2017-03-26 DIAGNOSIS — Q613 Polycystic kidney, unspecified: Secondary | ICD-10-CM

## 2017-03-27 NOTE — Telephone Encounter (Signed)
Previous tramadol script was a short course post surgery. She will need a f/u appointment if further tramadol is to be prescribed. If it is for an acute problem, she should definitely be encouraged to follow up earlier rather than later.  Thank you!  Estill Dooms

## 2017-04-01 DIAGNOSIS — N2581 Secondary hyperparathyroidism of renal origin: Secondary | ICD-10-CM | POA: Diagnosis not present

## 2017-04-01 DIAGNOSIS — N185 Chronic kidney disease, stage 5: Secondary | ICD-10-CM | POA: Diagnosis not present

## 2017-04-01 DIAGNOSIS — D631 Anemia in chronic kidney disease: Secondary | ICD-10-CM | POA: Diagnosis not present

## 2017-04-02 LAB — RENAL FUNCTION PANEL
BUN: 49 mg/dL — ABNORMAL HIGH (ref 6–24)
Calcium: 8.9 mg/dL (ref 8.7–10.2)
Carbon Dioxide, Total: 20 mmol/L (ref 20–29)
Chloride: 111 mmol/L — ABNORMAL HIGH (ref 96–106)
Creatine, Serum: 4.76 mg/dL — ABNORMAL HIGH (ref 0.57–1.00)
Glucose: 98 mg/dL (ref 65–99)
Phosphorus: 4.8 mg/dL — ABNORMAL HIGH (ref 2.5–4.5)
Potassium: 4.9 mmol/L (ref 3.5–5.2)
Sodium: 140 mmol/L (ref 134–144)

## 2017-04-02 LAB — PTH, INTACT: PTH, Intact: 138 pg/mL — ABNORMAL HIGH (ref 15–65)

## 2017-04-02 LAB — VITAMIN D, 25-OH,TOTAL,IA(REFL): Vit D, 25-Hydroxy: 13.1 ng/mL — ABNORMAL LOW (ref 30–100)

## 2017-04-06 ENCOUNTER — Encounter (HOSPITAL_COMMUNITY): Payer: Self-pay

## 2017-04-06 ENCOUNTER — Emergency Department (HOSPITAL_COMMUNITY): Payer: BLUE CROSS/BLUE SHIELD

## 2017-04-06 ENCOUNTER — Other Ambulatory Visit: Payer: Self-pay

## 2017-04-06 ENCOUNTER — Emergency Department (HOSPITAL_COMMUNITY)
Admission: EM | Admit: 2017-04-06 | Discharge: 2017-04-07 | Disposition: A | Discharge status: Home / Self Care | Payer: BLUE CROSS/BLUE SHIELD | Attending: Emergency Medicine | Admitting: Emergency Medicine

## 2017-04-06 DIAGNOSIS — Z79899 Other long term (current) drug therapy: Secondary | ICD-10-CM | POA: Insufficient documentation

## 2017-04-06 DIAGNOSIS — S199XXA Unspecified injury of neck, initial encounter: Secondary | ICD-10-CM | POA: Diagnosis not present

## 2017-04-06 DIAGNOSIS — I129 Hypertensive chronic kidney disease with stage 1 through stage 4 chronic kidney disease, or unspecified chronic kidney disease: Secondary | ICD-10-CM | POA: Insufficient documentation

## 2017-04-06 DIAGNOSIS — Z87440 Personal history of urinary (tract) infections: Secondary | ICD-10-CM | POA: Diagnosis not present

## 2017-04-06 DIAGNOSIS — Z87891 Personal history of nicotine dependence: Secondary | ICD-10-CM | POA: Diagnosis not present

## 2017-04-06 DIAGNOSIS — N185 Chronic kidney disease, stage 5: Secondary | ICD-10-CM | POA: Diagnosis not present

## 2017-04-06 DIAGNOSIS — M542 Cervicalgia: Secondary | ICD-10-CM | POA: Insufficient documentation

## 2017-04-06 DIAGNOSIS — S299XXA Unspecified injury of thorax, initial encounter: Secondary | ICD-10-CM | POA: Diagnosis not present

## 2017-04-06 DIAGNOSIS — N2581 Secondary hyperparathyroidism of renal origin: Secondary | ICD-10-CM | POA: Diagnosis not present

## 2017-04-06 DIAGNOSIS — R51 Headache: Secondary | ICD-10-CM | POA: Insufficient documentation

## 2017-04-06 DIAGNOSIS — N184 Chronic kidney disease, stage 4 (severe): Secondary | ICD-10-CM | POA: Diagnosis not present

## 2017-04-06 DIAGNOSIS — D631 Anemia in chronic kidney disease: Secondary | ICD-10-CM | POA: Diagnosis not present

## 2017-04-06 DIAGNOSIS — I120 Hypertensive chronic kidney disease with stage 5 chronic kidney disease or end stage renal disease: Secondary | ICD-10-CM | POA: Diagnosis not present
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
## 2017-04-07 ENCOUNTER — Emergency Department (HOSPITAL_COMMUNITY): Payer: BLUE CROSS/BLUE SHIELD

## 2017-04-07 DIAGNOSIS — R51 Headache: Secondary | ICD-10-CM | POA: Diagnosis not present

## 2017-04-07 DIAGNOSIS — M542 Cervicalgia: Secondary | ICD-10-CM | POA: Diagnosis not present

## 2017-04-07 MED ORDER — METHOCARBAMOL 500 MG PO TABS
1000.0000 mg | ORAL_TABLET | Freq: Once | ORAL | Status: AC
  Administered 2017-04-07: 1000 mg via ORAL
  Filled 2017-04-07: qty 2

## 2017-04-07 MED ORDER — METHOCARBAMOL 500 MG PO TABS: 500.0000 mg | ORAL_TABLET | Freq: Two times a day (BID) | ORAL | 0 refills | Status: DC

## 2017-04-07 MED ORDER — NAPROXEN 500 MG PO TABS
500.0000 mg | ORAL_TABLET | Freq: Once | ORAL | Status: AC
  Administered 2017-04-07: 500 mg via ORAL
  Filled 2017-04-07: qty 1

## 2017-04-07 NOTE — ED Notes (Signed)
BP 160/104. Notified RN.

## 2017-04-07 NOTE — ED Notes (Signed)
Pt states that she takes blood pressure medications for her BP and hasn't had them tonight .
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
## 2017-04-21 ENCOUNTER — Ambulatory Visit (HOSPITAL_COMMUNITY)
Admission: RE | Admit: 2017-04-21 | Discharge: 2017-04-21 | Disposition: A | Discharge status: Home / Self Care | Payer: BLUE CROSS/BLUE SHIELD | Source: Ambulatory Visit | Attending: Nephrology | Admitting: Nephrology

## 2017-04-21 VITALS — BP 126/69 | HR 88 | Temp 98.8°F | Resp 18

## 2017-04-21 DIAGNOSIS — N185 Chronic kidney disease, stage 5: Secondary | ICD-10-CM | POA: Diagnosis not present

## 2017-04-21 DIAGNOSIS — D631 Anemia in chronic kidney disease: Secondary | ICD-10-CM | POA: Insufficient documentation

## 2017-04-21 DIAGNOSIS — N184 Chronic kidney disease, stage 4 (severe): Secondary | ICD-10-CM

## 2017-04-21 LAB — POCT HEMOGLOBIN-HEMACUE: Hemoglobin: 9.8 g/dL — ABNORMAL LOW (ref 12.0–15.0)

## 2017-04-21 MED ORDER — EPOETIN ALFA-EPBX 10000 UNIT/ML IJ SOLN
10000.0000 [IU] | INTRAMUSCULAR | Status: DC
  Administered 2017-04-21: 10000 [IU] via SUBCUTANEOUS
  Filled 2017-04-21: qty 1

## 2017-04-21 NOTE — Discharge Instructions (Signed)
Epoetin Alfa injection °What is this medicine? °EPOETIN ALFA (e POE e tin AL fa) helps your body make more red blood cells. This medicine is used to treat anemia caused by chronic kidney failure, cancer chemotherapy, or HIV-therapy. It may also be used before surgery if you have anemia. °This medicine may be used for other purposes; ask your health care provider or pharmacist if you have questions. °COMMON BRAND NAME(S): Epogen, Procrit °What should I tell my health care provider before I take this medicine? °They need to know if you have any of these conditions: °-blood clotting disorders °-cancer patient not on chemotherapy °-cystic fibrosis °-heart disease, such as angina or heart failure °-hemoglobin level of 12 g/dL or greater °-high blood pressure °-low levels of folate, iron, or vitamin B12 °-seizures °-an unusual or allergic reaction to erythropoietin, albumin, benzyl alcohol, hamster proteins, other medicines, foods, dyes, or preservatives °-pregnant or trying to get pregnant °-breast-feeding °How should I use this medicine? °This medicine is for injection into a vein or under the skin. It is usually given by a health care professional in a hospital or clinic setting. °If you get this medicine at home, you will be taught how to prepare and give this medicine. Use exactly as directed. Take your medicine at regular intervals. Do not take your medicine more often than directed. °It is important that you put your used needles and syringes in a special sharps container. Do not put them in a trash can. If you do not have a sharps container, call your pharmacist or healthcare provider to get one. °A special MedGuide will be given to you by the pharmacist with each prescription and refill. Be sure to read this information carefully each time. °Talk to your pediatrician regarding the use of this medicine in children. While this drug may be prescribed for selected conditions, precautions do apply. °Overdosage: If you  think you have taken too much of this medicine contact a poison control center or emergency room at once. °NOTE: This medicine is only for you. Do not share this medicine with others. °What if I miss a dose? °If you miss a dose, take it as soon as you can. If it is almost time for your next dose, take only that dose. Do not take double or extra doses. °What may interact with this medicine? °Do not take this medicine with any of the following medications: °-darbepoetin alfa °This list may not describe all possible interactions. Give your health care provider a list of all the medicines, herbs, non-prescription drugs, or dietary supplements you use. Also tell them if you smoke, drink alcohol, or use illegal drugs. Some items may interact with your medicine. °What should I watch for while using this medicine? °Your condition will be monitored carefully while you are receiving this medicine. °You may need blood work done while you are taking this medicine. °What side effects may I notice from receiving this medicine? °Side effects that you should report to your doctor or health care professional as soon as possible: °-allergic reactions like skin rash, itching or hives, swelling of the face, lips, or tongue °-breathing problems °-changes in vision °-chest pain °-confusion, trouble speaking or understanding °-feeling faint or lightheaded, falls °-high blood pressure °-muscle aches or pains °-pain, swelling, warmth in the leg °-rapid weight gain °-severe headaches °-sudden numbness or weakness of the face, arm or leg °-trouble walking, dizziness, loss of balance or coordination °-seizures (convulsions) °-swelling of the ankles, feet, hands °-unusually weak or tired °  Side effects that usually do not require medical attention (report to your doctor or health care professional if they continue or are bothersome): °-diarrhea °-fever, chills (flu-like symptoms) °-headaches °-nausea, vomiting °-redness, stinging, or swelling at  site where injected °This list may not describe all possible side effects. Call your doctor for medical advice about side effects. You may report side effects to FDA at 1-800-FDA-1088. °Where should I keep my medicine? °Keep out of the reach of children. °Store in a refrigerator between 2 and 8 degrees C (36 and 46 degrees F). Do not freeze or shake. Throw away any unused portion if using a single-dose vial. Multi-dose vials can be kept in the refrigerator for up to 21 days after the initial dose. Throw away unused medicine. °NOTE: This sheet is a summary. It may not cover all possible information. If you have questions about this medicine, talk to your doctor, pharmacist, or health care provider. °© 2018 Elsevier/Gold Standard (2015-08-27 19:42:31) ° °

## 2017-04-24 DIAGNOSIS — Z87440 Personal history of urinary (tract) infections: Secondary | ICD-10-CM | POA: Diagnosis not present

## 2017-05-05 DIAGNOSIS — N184 Chronic kidney disease, stage 4 (severe): Secondary | ICD-10-CM | POA: Diagnosis not present

## 2017-05-06 ENCOUNTER — Ambulatory Visit (HOSPITAL_COMMUNITY)
Admission: RE | Admit: 2017-05-06 | Discharge: 2017-05-06 | Disposition: A | Discharge status: Home / Self Care | Payer: BLUE CROSS/BLUE SHIELD | Source: Ambulatory Visit | Attending: Nephrology | Admitting: Nephrology

## 2017-05-06 VITALS — BP 142/80 | HR 83 | Temp 97.7°F | Resp 18

## 2017-05-06 DIAGNOSIS — N185 Chronic kidney disease, stage 5: Secondary | ICD-10-CM | POA: Insufficient documentation

## 2017-05-06 DIAGNOSIS — D631 Anemia in chronic kidney disease: Secondary | ICD-10-CM | POA: Insufficient documentation

## 2017-05-06 DIAGNOSIS — N184 Chronic kidney disease, stage 4 (severe): Secondary | ICD-10-CM

## 2017-05-06 LAB — POCT HEMOGLOBIN-HEMACUE: Hemoglobin: 9.8 g/dL — ABNORMAL LOW (ref 12.0–15.0)

## 2017-05-06 MED ORDER — EPOETIN ALFA-EPBX 10000 UNIT/ML IJ SOLN
10000.0000 [IU] | INTRAMUSCULAR | Status: DC
  Administered 2017-05-06: 10000 [IU] via SUBCUTANEOUS
  Filled 2017-05-06: qty 1

## 2017-05-07 ENCOUNTER — Encounter: Payer: Self-pay | Admitting: Internal Medicine

## 2017-05-14 ENCOUNTER — Other Ambulatory Visit: Payer: Self-pay | Admitting: Internal Medicine

## 2017-05-14 DIAGNOSIS — Z1231 Encounter for screening mammogram for malignant neoplasm of breast: Secondary | ICD-10-CM

## 2017-05-14 DIAGNOSIS — Z01818 Encounter for other preprocedural examination: Secondary | ICD-10-CM | POA: Diagnosis not present

## 2017-05-20 ENCOUNTER — Ambulatory Visit (HOSPITAL_COMMUNITY)
Admission: RE | Admit: 2017-05-20 | Discharge: 2017-05-20 | Disposition: A | Discharge status: Home / Self Care | Payer: BLUE CROSS/BLUE SHIELD | Source: Ambulatory Visit | Attending: Nephrology | Admitting: Nephrology

## 2017-05-20 VITALS — BP 132/82 | HR 66 | Temp 98.0°F | Resp 20

## 2017-05-20 DIAGNOSIS — D631 Anemia in chronic kidney disease: Secondary | ICD-10-CM | POA: Insufficient documentation

## 2017-05-20 DIAGNOSIS — N189 Chronic kidney disease, unspecified: Secondary | ICD-10-CM | POA: Diagnosis not present

## 2017-05-20 DIAGNOSIS — N184 Chronic kidney disease, stage 4 (severe): Secondary | ICD-10-CM

## 2017-05-20 LAB — RENAL FUNCTION PANEL
Albumin: 3.5 g/dL (ref 3.5–5.0)
Anion gap: 12 (ref 5–15)
BUN: 65 mg/dL — ABNORMAL HIGH (ref 6–20)
CO2: 20 mmol/L — ABNORMAL LOW (ref 22–32)
Calcium: 8.4 mg/dL — ABNORMAL LOW (ref 8.9–10.3)
Chloride: 108 mmol/L (ref 101–111)
Creatinine, Ser: 4.96 mg/dL — ABNORMAL HIGH (ref 0.44–1.00)
GFR calc Af Amer: 10 mL/min — ABNORMAL LOW (ref >60)
GFR calc non Af Amer: 9 mL/min — ABNORMAL LOW (ref >60)
Glucose, Bld: 96 mg/dL (ref 65–99)
Phosphorus: 5.8 mg/dL — ABNORMAL HIGH (ref 2.5–4.6)
Potassium: 5.1 mmol/L (ref 3.5–5.1)
Sodium: 140 mmol/L (ref 135–145)

## 2017-05-20 LAB — IRON AND TIBC
Iron: 45 ug/dL (ref 28–170)
Saturation Ratios: 15 % (ref 10.4–31.8)
TIBC: 309 ug/dL (ref 250–450)
UIBC: 264 ug/dL

## 2017-05-20 LAB — MAGNESIUM: Magnesium: 2.1 mg/dL (ref 1.7–2.4)

## 2017-05-20 LAB — FERRITIN: Ferritin: 24 ng/mL (ref 11–307)

## 2017-05-20 MED ORDER — EPOETIN ALFA-EPBX 10000 UNIT/ML IJ SOLN
10000.0000 [IU] | INTRAMUSCULAR | Status: DC
  Administered 2017-05-20: 10000 [IU] via SUBCUTANEOUS
  Filled 2017-05-20: qty 1

## 2017-05-21 ENCOUNTER — Ambulatory Visit
Admission: RE | Admit: 2017-05-21 | Discharge: 2017-05-21 | Disposition: A | Discharge status: Home / Self Care | Payer: BLUE CROSS/BLUE SHIELD | Source: Ambulatory Visit | Attending: Nurse Practitioner | Admitting: Nurse Practitioner

## 2017-05-21 DIAGNOSIS — Z1231 Encounter for screening mammogram for malignant neoplasm of breast: Secondary | ICD-10-CM | POA: Diagnosis not present

## 2017-05-21 LAB — POCT HEMOGLOBIN-HEMACUE: Hemoglobin: 10.4 g/dL — ABNORMAL LOW (ref 12.0–15.0)

## 2017-06-03 ENCOUNTER — Ambulatory Visit (HOSPITAL_COMMUNITY)
Admission: RE | Admit: 2017-06-03 | Discharge: 2017-06-03 | Disposition: A | Discharge status: Home / Self Care | Payer: BLUE CROSS/BLUE SHIELD | Source: Ambulatory Visit | Attending: Nephrology | Admitting: Nephrology

## 2017-06-03 VITALS — BP 147/90 | HR 81 | Temp 98.6°F | Resp 20

## 2017-06-03 DIAGNOSIS — D631 Anemia in chronic kidney disease: Secondary | ICD-10-CM | POA: Insufficient documentation

## 2017-06-03 DIAGNOSIS — N185 Chronic kidney disease, stage 5: Secondary | ICD-10-CM | POA: Insufficient documentation

## 2017-06-03 DIAGNOSIS — N184 Chronic kidney disease, stage 4 (severe): Secondary | ICD-10-CM

## 2017-06-03 LAB — POCT HEMOGLOBIN-HEMACUE: Hemoglobin: 10.2 g/dL — ABNORMAL LOW (ref 12.0–15.0)

## 2017-06-03 MED ORDER — EPOETIN ALFA-EPBX 10000 UNIT/ML IJ SOLN
10000.0000 [IU] | INTRAMUSCULAR | Status: DC
  Administered 2017-06-03: 10000 [IU] via SUBCUTANEOUS
  Filled 2017-06-03 (×2): qty 1

## 2017-06-04 DIAGNOSIS — N2581 Secondary hyperparathyroidism of renal origin: Secondary | ICD-10-CM | POA: Diagnosis not present

## 2017-06-04 DIAGNOSIS — I120 Hypertensive chronic kidney disease with stage 5 chronic kidney disease or end stage renal disease: Secondary | ICD-10-CM | POA: Diagnosis not present

## 2017-06-04 DIAGNOSIS — N185 Chronic kidney disease, stage 5: Secondary | ICD-10-CM | POA: Diagnosis not present

## 2017-06-04 DIAGNOSIS — D631 Anemia in chronic kidney disease: Secondary | ICD-10-CM | POA: Diagnosis not present

## 2017-06-05 ENCOUNTER — Encounter: Payer: Self-pay | Admitting: Vascular Surgery

## 2017-06-05 ENCOUNTER — Ambulatory Visit: Payer: BLUE CROSS/BLUE SHIELD | Admitting: Vascular Surgery

## 2017-06-05 ENCOUNTER — Other Ambulatory Visit: Payer: Self-pay | Admitting: *Deleted

## 2017-06-05 ENCOUNTER — Encounter: Payer: Self-pay | Admitting: *Deleted

## 2017-06-05 ENCOUNTER — Other Ambulatory Visit: Payer: Self-pay

## 2017-06-05 VITALS — BP 142/83 | HR 72 | Temp 98.3°F | Resp 16 | Ht 66.0 in | Wt 186.0 lb

## 2017-06-05 DIAGNOSIS — N184 Chronic kidney disease, stage 4 (severe): Secondary | ICD-10-CM | POA: Diagnosis not present
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
## 2017-06-16 ENCOUNTER — Other Ambulatory Visit (HOSPITAL_COMMUNITY): Payer: Self-pay

## 2017-06-17 ENCOUNTER — Ambulatory Visit (HOSPITAL_COMMUNITY)
Admission: RE | Admit: 2017-06-17 | Discharge: 2017-06-17 | Disposition: A | Discharge status: Home / Self Care | Payer: BLUE CROSS/BLUE SHIELD | Source: Ambulatory Visit | Attending: Nephrology | Admitting: Nephrology

## 2017-06-17 VITALS — BP 133/81 | HR 72 | Temp 98.6°F | Resp 18 | Ht 66.0 in | Wt 183.0 lb

## 2017-06-17 DIAGNOSIS — N185 Chronic kidney disease, stage 5: Secondary | ICD-10-CM | POA: Diagnosis not present

## 2017-06-17 DIAGNOSIS — D631 Anemia in chronic kidney disease: Secondary | ICD-10-CM | POA: Insufficient documentation

## 2017-06-17 DIAGNOSIS — N184 Chronic kidney disease, stage 4 (severe): Secondary | ICD-10-CM

## 2017-06-17 LAB — POCT HEMOGLOBIN-HEMACUE: Hemoglobin: 9.9 g/dL — ABNORMAL LOW (ref 12.0–15.0)

## 2017-06-17 MED ORDER — SODIUM CHLORIDE 0.9 % IV SOLN
510.0000 mg | INTRAVENOUS | Status: DC
  Administered 2017-06-17: 510 mg via INTRAVENOUS
  Filled 2017-06-17: qty 17

## 2017-06-17 MED ORDER — EPOETIN ALFA-EPBX 10000 UNIT/ML IJ SOLN
10000.0000 [IU] | INTRAMUSCULAR | Status: DC
  Administered 2017-06-17: 10000 [IU] via SUBCUTANEOUS
  Filled 2017-06-17: qty 1

## 2017-06-17 NOTE — Progress Notes (Signed)
Pt orders to give feraheme times 2 one week apart and retarcit every 2 weeks.  Pt stated it was difficult for her to get off work to come three weeks in a row so asked if we could do it all together in two weeks.  I scheduled her second dose of feraheme in two weeks along with her shot and left Stacey at [facility] a message letting her know to call if this is a problem.  Pt aware.

## 2017-06-22 ENCOUNTER — Other Ambulatory Visit: Payer: Self-pay

## 2017-06-22 ENCOUNTER — Encounter (HOSPITAL_COMMUNITY): Payer: Self-pay | Admitting: *Deleted

## 2017-06-22 NOTE — Progress Notes (Signed)
Pt denies SOB, chest pain, and being under the care of a cardiologist. Pt denies having acardiac cath. Pt made aware to stop taking vitamins, fish oil and herbal medications. Do not take any NSAIDs ie: Ibuprofen, Advil, Naproxen (Aleve), Motrin, BC and Goody Powder. Pt verbalized understanding of all pre-op instructions.

## 2017-06-23 ENCOUNTER — Encounter (HOSPITAL_COMMUNITY): Payer: Self-pay | Admitting: Certified Registered"

## 2017-06-23 ENCOUNTER — Ambulatory Visit (HOSPITAL_COMMUNITY): Payer: BLUE CROSS/BLUE SHIELD | Admitting: Anesthesiology

## 2017-06-23 ENCOUNTER — Ambulatory Visit (HOSPITAL_COMMUNITY)
Admission: RE | Admit: 2017-06-23 | Discharge: 2017-06-23 | Disposition: A | Discharge status: Home / Self Care | Payer: BLUE CROSS/BLUE SHIELD | Source: Ambulatory Visit | Attending: Vascular Surgery | Admitting: Vascular Surgery

## 2017-06-23 ENCOUNTER — Encounter (HOSPITAL_COMMUNITY)
Admission: RE | Disposition: A | Discharge status: Home / Self Care | Payer: Self-pay | Source: Ambulatory Visit | Attending: Vascular Surgery

## 2017-06-23 ENCOUNTER — Telehealth: Payer: Self-pay | Admitting: Vascular Surgery

## 2017-06-23 DIAGNOSIS — N184 Chronic kidney disease, stage 4 (severe): Secondary | ICD-10-CM

## 2017-06-23 DIAGNOSIS — G4730 Sleep apnea, unspecified: Secondary | ICD-10-CM | POA: Insufficient documentation

## 2017-06-23 DIAGNOSIS — I120 Hypertensive chronic kidney disease with stage 5 chronic kidney disease or end stage renal disease: Secondary | ICD-10-CM | POA: Diagnosis not present

## 2017-06-23 DIAGNOSIS — E282 Polycystic ovarian syndrome: Secondary | ICD-10-CM | POA: Insufficient documentation

## 2017-06-23 DIAGNOSIS — Q613 Polycystic kidney, unspecified: Secondary | ICD-10-CM

## 2017-06-23 DIAGNOSIS — N189 Chronic kidney disease, unspecified: Secondary | ICD-10-CM | POA: Insufficient documentation

## 2017-06-23 DIAGNOSIS — M1712 Unilateral primary osteoarthritis, left knee: Secondary | ICD-10-CM | POA: Insufficient documentation

## 2017-06-23 DIAGNOSIS — M1612 Unilateral primary osteoarthritis, left hip: Secondary | ICD-10-CM | POA: Insufficient documentation

## 2017-06-23 DIAGNOSIS — I129 Hypertensive chronic kidney disease with stage 1 through stage 4 chronic kidney disease, or unspecified chronic kidney disease: Secondary | ICD-10-CM | POA: Insufficient documentation

## 2017-06-23 DIAGNOSIS — G4733 Obstructive sleep apnea (adult) (pediatric): Secondary | ICD-10-CM | POA: Insufficient documentation

## 2017-06-23 DIAGNOSIS — Z87891 Personal history of nicotine dependence: Secondary | ICD-10-CM | POA: Insufficient documentation

## 2017-06-23 DIAGNOSIS — Z79899 Other long term (current) drug therapy: Secondary | ICD-10-CM | POA: Diagnosis not present

## 2017-06-23 DIAGNOSIS — Z888 Allergy status to other drugs, medicaments and biological substances status: Secondary | ICD-10-CM | POA: Diagnosis not present

## 2017-06-23 DIAGNOSIS — K219 Gastro-esophageal reflux disease without esophagitis: Secondary | ICD-10-CM | POA: Diagnosis not present

## 2017-06-23 DIAGNOSIS — E785 Hyperlipidemia, unspecified: Secondary | ICD-10-CM | POA: Diagnosis not present

## 2017-06-23 DIAGNOSIS — N185 Chronic kidney disease, stage 5: Secondary | ICD-10-CM | POA: Diagnosis not present

## 2017-06-23 HISTORY — PX: AV FISTULA PLACEMENT: SHX1204

## 2017-06-23 LAB — POCT I-STAT 4, (NA,K, GLUC, HGB,HCT)
Glucose, Bld: 98 mg/dL (ref 65–99)
HCT: 33 % — ABNORMAL LOW (ref 36.0–46.0)
Hemoglobin: 11.2 g/dL — ABNORMAL LOW (ref 12.0–15.0)
Potassium: 4.5 mmol/L (ref 3.5–5.1)
Sodium: 143 mmol/L (ref 135–145)

## 2017-06-23 SURGERY — ARTERIOVENOUS (AV) FISTULA CREATION
Anesthesia: General | Site: Arm Upper | Laterality: Left

## 2017-06-23 MED ORDER — ONDANSETRON HCL 4 MG/2ML IJ SOLN
4.0000 mg | Freq: Once | INTRAMUSCULAR | Status: AC | PRN
  Administered 2017-06-23: 4 mg via INTRAVENOUS

## 2017-06-23 MED ORDER — FENTANYL CITRATE (PF) 100 MCG/2ML IJ SOLN
INTRAMUSCULAR | Status: AC
  Filled 2017-06-23: qty 2

## 2017-06-23 MED ORDER — MIDAZOLAM HCL 5 MG/5ML IJ SOLN
INTRAMUSCULAR | Status: DC | PRN
  Administered 2017-06-23: 2 mg via INTRAVENOUS

## 2017-06-23 MED ORDER — 0.9 % SODIUM CHLORIDE (POUR BTL) OPTIME
TOPICAL | Status: DC | PRN
  Administered 2017-06-23: 1000 mL

## 2017-06-23 MED ORDER — OXYCODONE HCL 5 MG PO TABS
ORAL_TABLET | ORAL | Status: AC
  Filled 2017-06-23: qty 1

## 2017-06-23 MED ORDER — CHLORHEXIDINE GLUCONATE 4 % EX LIQD: 60.0000 mL | Freq: Once | CUTANEOUS | Status: DC

## 2017-06-23 MED ORDER — ONDANSETRON HCL 4 MG/2ML IJ SOLN
INTRAMUSCULAR | Status: AC
  Filled 2017-06-23: qty 2

## 2017-06-23 MED ORDER — SODIUM CHLORIDE 0.9 % IV SOLN
INTRAVENOUS | Status: DC | PRN
  Administered 2017-06-23: 500 mL

## 2017-06-23 MED ORDER — PROPOFOL 10 MG/ML IV BOLUS
INTRAVENOUS | Status: AC
  Filled 2017-06-23: qty 20

## 2017-06-23 MED ORDER — SODIUM CHLORIDE 0.9 % IV SOLN
INTRAVENOUS | Status: DC
  Administered 2017-06-23: 07:00:00 via INTRAVENOUS

## 2017-06-23 MED ORDER — LIDOCAINE HCL (PF) 1 % IJ SOLN
INTRAMUSCULAR | Status: AC
  Filled 2017-06-23: qty 30

## 2017-06-23 MED ORDER — DEXAMETHASONE SODIUM PHOSPHATE 10 MG/ML IJ SOLN
INTRAMUSCULAR | Status: DC | PRN
  Administered 2017-06-23: 5 mg via INTRAVENOUS

## 2017-06-23 MED ORDER — OXYCODONE HCL 5 MG PO TABS
5.0000 mg | ORAL_TABLET | Freq: Once | ORAL | Status: AC | PRN
  Administered 2017-06-23: 5 mg via ORAL

## 2017-06-23 MED ORDER — CEFAZOLIN SODIUM-DEXTROSE 2-4 GM/100ML-% IV SOLN
INTRAVENOUS | Status: AC
  Filled 2017-06-23: qty 100

## 2017-06-23 MED ORDER — SODIUM CHLORIDE 0.9 % IV SOLN
INTRAVENOUS | Status: AC
  Filled 2017-06-23: qty 1.2

## 2017-06-23 MED ORDER — TRAMADOL HCL 50 MG PO TABS: 50.0000 mg | ORAL_TABLET | Freq: Four times a day (QID) | ORAL | 0 refills | Status: DC | PRN

## 2017-06-23 MED ORDER — FENTANYL CITRATE (PF) 250 MCG/5ML IJ SOLN
INTRAMUSCULAR | Status: AC
  Filled 2017-06-23: qty 5

## 2017-06-23 MED ORDER — ONDANSETRON HCL 4 MG/2ML IJ SOLN
INTRAMUSCULAR | Status: DC | PRN
  Administered 2017-06-23: 4 mg via INTRAVENOUS

## 2017-06-23 MED ORDER — FENTANYL CITRATE (PF) 100 MCG/2ML IJ SOLN
INTRAMUSCULAR | Status: DC | PRN
  Administered 2017-06-23 (×2): 50 ug via INTRAVENOUS

## 2017-06-23 MED ORDER — PROPOFOL 10 MG/ML IV BOLUS
INTRAVENOUS | Status: DC | PRN
  Administered 2017-06-23: 150 mg via INTRAVENOUS
  Administered 2017-06-23: 10 mg via INTRAVENOUS

## 2017-06-23 MED ORDER — OXYCODONE HCL 5 MG/5ML PO SOLN: 5.0000 mg | Freq: Once | ORAL | Status: AC | PRN

## 2017-06-23 MED ORDER — LIDOCAINE 2% (20 MG/ML) 5 ML SYRINGE
INTRAMUSCULAR | Status: AC
  Filled 2017-06-23: qty 5

## 2017-06-23 MED ORDER — FENTANYL CITRATE (PF) 100 MCG/2ML IJ SOLN
25.0000 ug | INTRAMUSCULAR | Status: DC | PRN
  Administered 2017-06-23 (×2): 25 ug via INTRAVENOUS

## 2017-06-23 MED ORDER — DEXAMETHASONE SODIUM PHOSPHATE 10 MG/ML IJ SOLN
INTRAMUSCULAR | Status: AC
  Filled 2017-06-23: qty 1

## 2017-06-23 MED ORDER — MIDAZOLAM HCL 2 MG/2ML IJ SOLN
INTRAMUSCULAR | Status: AC
  Filled 2017-06-23: qty 2

## 2017-06-23 MED ORDER — LIDOCAINE 2% (20 MG/ML) 5 ML SYRINGE
INTRAMUSCULAR | Status: DC | PRN
  Administered 2017-06-23: 40 mg via INTRAVENOUS

## 2017-06-23 MED ORDER — CEFAZOLIN SODIUM-DEXTROSE 2-4 GM/100ML-% IV SOLN
2.0000 g | INTRAVENOUS | Status: AC
  Administered 2017-06-23: 2 g via INTRAVENOUS

## 2017-06-23 SURGICAL SUPPLY — 36 items
ADH SKN CLS APL DERMABOND .7 (GAUZE/BANDAGES/DRESSINGS) ×1
ARMBAND PINK RESTRICT EXTREMIT (MISCELLANEOUS) ×2 IMPLANT
CANISTER SUCT 3000ML PPV (MISCELLANEOUS) ×2 IMPLANT
CLIP VESOCCLUDE MED 6/CT (CLIP) ×2 IMPLANT
CLIP VESOCCLUDE SM WIDE 6/CT (CLIP) ×3 IMPLANT
COVER PROBE W GEL 5X96 (DRAPES) IMPLANT
DERMABOND ADVANCED (GAUZE/BANDAGES/DRESSINGS) ×1
DERMABOND ADVANCED .7 DNX12 (GAUZE/BANDAGES/DRESSINGS) ×1 IMPLANT
ELECT REM PT RETURN 9FT ADLT (ELECTROSURGICAL) ×2
ELECTRODE REM PT RTRN 9FT ADLT (ELECTROSURGICAL) ×1 IMPLANT
GLOVE BIO SURGEON STRL SZ 6.5 (GLOVE) ×2 IMPLANT
GLOVE BIO SURGEON STRL SZ7.5 (GLOVE) ×2 IMPLANT
GLOVE BIOGEL PI IND STRL 6.5 (GLOVE) IMPLANT
GLOVE BIOGEL PI IND STRL 7.0 (GLOVE) IMPLANT
GLOVE BIOGEL PI IND STRL 7.5 (GLOVE) IMPLANT
GLOVE BIOGEL PI INDICATOR 6.5 (GLOVE) ×1
GLOVE BIOGEL PI INDICATOR 7.0 (GLOVE) ×1
GLOVE BIOGEL PI INDICATOR 7.5 (GLOVE) ×1
GLOVE SKINSENSE NS SZ7.0 (GLOVE) ×2
GLOVE SKINSENSE STRL SZ7.0 (GLOVE) IMPLANT
GOWN STRL REUS W/ TWL LRG LVL3 (GOWN DISPOSABLE) ×2 IMPLANT
GOWN STRL REUS W/ TWL XL LVL3 (GOWN DISPOSABLE) ×1 IMPLANT
GOWN STRL REUS W/TWL LRG LVL3 (GOWN DISPOSABLE) ×4
GOWN STRL REUS W/TWL XL LVL3 (GOWN DISPOSABLE) ×6
KIT BASIN OR (CUSTOM PROCEDURE TRAY) ×2 IMPLANT
KIT TURNOVER KIT B (KITS) ×2 IMPLANT
NS IRRIG 1000ML POUR BTL (IV SOLUTION) ×2 IMPLANT
PACK CV ACCESS (CUSTOM PROCEDURE TRAY) ×2 IMPLANT
PAD ARMBOARD 7.5X6 YLW CONV (MISCELLANEOUS) ×4 IMPLANT
SUT MNCRL AB 4-0 PS2 18 (SUTURE) ×2 IMPLANT
SUT PROLENE 6 0 BV (SUTURE) ×2 IMPLANT
SUT VIC AB 3-0 SH 27 (SUTURE) ×2
SUT VIC AB 3-0 SH 27X BRD (SUTURE) ×1 IMPLANT
TOWEL GREEN STERILE (TOWEL DISPOSABLE) ×2 IMPLANT
UNDERPAD 30X30 (UNDERPADS AND DIAPERS) ×2 IMPLANT
WATER STERILE IRR 1000ML POUR (IV SOLUTION) ×2 IMPLANT

## 2017-06-23 NOTE — Anesthesia Preprocedure Evaluation (Addendum)
Anesthesia Evaluation  Patient identified by MRN, date of birth, ID band Patient awake    Reviewed: Allergy & Precautions, NPO status , Patient's Chart, lab work & pertinent test results  Airway Mallampati: II  TM Distance: >3 FB Neck ROM: Full    Dental  (+) Teeth Intact, Dental Advisory Given   Pulmonary sleep apnea , former smoker,    breath sounds clear to auscultation       Cardiovascular hypertension, Pt. on medications and Pt. on home beta blockers  Rhythm:Regular Rate:Normal     Neuro/Psych    GI/Hepatic   Endo/Other    Renal/GU CRFRenal disease     Musculoskeletal   Abdominal   Peds  Hematology   Anesthesia Other Findings   Reproductive/Obstetrics                            Anesthesia Physical Anesthesia Plan  ASA: III  Anesthesia Plan: General   Post-op Pain Management:    Induction: Intravenous  PONV Risk Score and Plan: 3 and Ondansetron, Dexamethasone and Midazolam  Airway Management Planned: LMA  Additional Equipment: None  Intra-op Plan:   Post-operative Plan: Extubation in OR  Informed Consent: I have reviewed the patients History and Physical, chart, labs and discussed the procedure including the risks, benefits and alternatives for the proposed anesthesia with the patient or authorized representative who has indicated his/her understanding and acceptance.   Dental advisory given  Plan Discussed with: CRNA, Anesthesiologist and Surgeon  Anesthesia Plan Comments:        Anesthesia Quick Evaluation

## 2017-06-23 NOTE — Anesthesia Postprocedure Evaluation (Signed)
Anesthesia Post Note  Patient: Kristen Moreno  Procedure(s) Performed: Left arm Brachiocephalic ARTERIOVENOUS (AV) FISTULA CREATION (Left Arm Upper)     Patient location during evaluation: PACU Anesthesia Type: General Level of consciousness: awake and alert Pain management: pain level controlled Vital Signs Assessment: post-procedure vital signs reviewed and stable Respiratory status: spontaneous breathing, nonlabored ventilation, respiratory function stable and patient connected to nasal cannula oxygen Cardiovascular status: blood pressure returned to baseline and stable Postop Assessment: no apparent nausea or vomiting Anesthetic complications: no    Last Vitals:  Vitals:   06/23/17 0955 06/23/17 1000  BP: (!) 141/83   Pulse: 90 96  Resp: 16 (!) 27  Temp:    SpO2: 99% 99%    Last Pain:  Vitals:   06/23/17 0946  PainSc: 3                  Gladies Sofranko COKER

## 2017-06-23 NOTE — Transfer of Care (Signed)
Immediate Anesthesia Transfer of Care Note  Patient: Kristen Moreno  Procedure(s) Performed: Left arm Brachiocephalic ARTERIOVENOUS (AV) FISTULA CREATION (Left Arm Upper)  Patient Location: PACU  Anesthesia Type:General  Level of Consciousness: awake and patient cooperative  Airway & Oxygen Therapy: Patient Spontanous Breathing and Patient connected to nasal cannula oxygen  Post-op Assessment: Report given to RN, Post -op Vital signs reviewed and stable and Patient moving all extremities  Post vital signs: Reviewed and stable  Last Vitals:  Vitals Value Taken Time  BP    Temp    Pulse    Resp    SpO2      Last Pain:  Vitals:   06/23/17 0641  PainSc: 0-No pain      Patients Stated Pain Goal: 1 (70/35/00 9381)  Complications: No apparent anesthesia complications

## 2017-06-23 NOTE — Op Note (Signed)
    Patient name: Kristen Moreno MRN: [id number] DOB: 13-May-1960 Sex: female  06/23/2017 Pre-operative Diagnosis: Chronic kidney disease Post-operative diagnosis:  Same Surgeon:  Eda Paschal. Donzetta Matters, MD Assistant: Leontine Locket, PA Procedure Performed: Left first stage basilic vein transposition fistula  Indications: 57 year old female with chronic kidney disease previously had a failed left brachiocephalic fistula.  She now is indicated for fistula versus graft.  Findings: The basilic vein on the left was greater than 4 mm in the upper arm.  At the antecubitum it branched but easily dilated to 4 mm.  At completion there was a thrill in the mid upper arm palpable radial pulse left.   Procedure:  The patient was identified in the holding area and taken to the operating room where she was placed supine on the operative table and general anesthesia was induced.  She was sterilely prepped draped the left upper extremity usual fashion given antibiotics and timeout was called.  Wheeze ultrasound to evaluate the upper extremity where we noted a large basilic vein in the upper arm which branch of the antecubitum.  We then made a transverse incision below the antecubitum dissected down to the vein marker for orientation.  We then dissected deeper and divided the deep fascia identified the brachial artery.  Here there was a previous fistula we had a dissected out around and then placed Vesseloops around the artery itself.  We then transected the vein distally dilated up to 4 mm and flushed with heparinized saline.  We then clamped the artery distally and proximally opened longitudinally flushed with heparinized saline both directions.  The artery was then sewn end-to-side with 6-0 Prolene suture.  Prior to completion we allowed flushing maneuvers in all directions.  After we completed we then had a thrill in the vein but was very weak we then dissected out soft tissue and clamped branches.  We had did of having a  good thrill in the mid upper arm palpable radial pulse in the left and this was confirmed with Doppler.  Satisfied we irrigated the wound we just closed the skin with 4-0 Monocryl suture placed Dermabond at the level of the skin.  She tolerated this procedure well without immediate comp occasion.  Next  EBL 25 cc.   Myrtha Tonkovich C. Donzetta Matters, MD [facility] [place] Office: [phone] Pager: [phone]

## 2017-06-23 NOTE — Discharge Instructions (Signed)
[facility]  Discharge Instructions  AV Fistula or Graft Surgery for Dialysis Access  Please refer to the following instructions for your post-procedure care. Your surgeon or physician assistant will discuss any changes with you.  Activity  You may drive the day following your surgery, if you are comfortable and no longer taking prescription pain medication. Resume full activity as the soreness in your incision resolves.  Bathing/Showering  You may shower after you go home. Keep your incision dry for 48 hours. Do not soak in a bathtub, hot tub, or swim until the incision heals completely. You may not shower if you have a hemodialysis catheter.  Incision Care  Clean your incision with mild soap and water after 48 hours. Pat the area dry with a clean towel. You do not need a bandage unless otherwise instructed. Do not apply any ointments or creams to your incision. You may have skin glue on your incision. Do not peel it off. It will come off on its own in about one week. Your arm may swell a bit after surgery. To reduce swelling use pillows to elevate your arm so it is above your heart. Your doctor will tell you if you need to lightly wrap your arm with an ACE bandage.  Diet  Resume your normal diet. There are not special food restrictions following this procedure. In order to heal from your surgery, it is CRITICAL to get adequate nutrition. Your body requires vitamins, minerals, and protein. Vegetables are the best source of vitamins and minerals. Vegetables also provide the perfect balance of protein. Processed food has little nutritional value, so try to avoid this.  Medications  Resume taking all of your medications. If your incision is causing pain, you may take over-the counter pain relievers such as acetaminophen (Tylenol). If you were prescribed a stronger pain medication, please be aware these medications can cause nausea and constipation. Prevent  nausea by taking the medication with a snack or meal. Avoid constipation by drinking plenty of fluids and eating foods with high amount of fiber, such as fruits, vegetables, and grains.  Do not take Tylenol if you are taking prescription pain medications.  Follow up Your surgeon may want to see you in the office following your access surgery. If so, this will be arranged at the time of your surgery.  Please call us immediately for any of the following conditions:  Increased pain, redness, drainage (pus) from your incision site Fever of 101 degrees or higher Severe or worsening pain at your incision site Hand pain or numbness.  Reduce your risk of vascular disease:  Stop smoking. If you would like help, call [facility] at 1-800-QUIT-NOW 570-088-1658) or [facility] at [place] your cholesterol Maintain a desired weight Control your diabetes Keep your blood pressure down  Dialysis  It will take several weeks to several months for your new dialysis access to be ready for use. Your surgeon will determine when it is okay to use it. Your nephrologist will continue to direct your dialysis. You can continue to use your Permcath until your new access is ready for use.   06/23/2017 Kristen Moreno [id number] 06/02/1960  Surgeon(s): Waynetta Sandy, MD  Procedure(s): Left arm 1st stage basilic vein transposition  x Do not stick fistula for 12 weeks    If you have any questions, please call the office at [phone].   Post Anesthesia Home Care Instructions  Activity: Get plenty of rest for the remainder  of the day. A responsible individual must stay with you for 24 hours following the procedure.  For the next 24 hours, DO NOT: -Drive a car -Paediatric nurse -Drink alcoholic beverages -Take any medication unless instructed by your physician -Make any legal decisions or sign important papers.  Meals: Start with liquid foods such as gelatin or soup.  Progress to regular foods as tolerated. Avoid greasy, spicy, heavy foods. If nausea and/or vomiting occur, drink only clear liquids until the nausea and/or vomiting subsides. Call your physician if vomiting continues.  Special Instructions/Symptoms: Your throat may feel dry or sore from the anesthesia or the breathing tube placed in your throat during surgery. If this causes discomfort, gargle with warm salt water. The discomfort should disappear within 24 hours.  If you had a scopolamine patch placed behind your ear for the management of post- operative nausea and/or vomiting:  1. The medication in the patch is effective for 72 hours, after which it should be removed.  Wrap patch in a tissue and discard in the trash. Wash hands thoroughly with soap and water. 2. You may remove the patch earlier than 72 hours if you experience unpleasant side effects which may include dry mouth, dizziness or visual disturbances. 3. Avoid touching the patch. Wash your hands with soap and water after contact with the patch.

## 2017-06-23 NOTE — H&P (Signed)
   History and Physical Update  The patient was interviewed and re-examined.  The patient's previous History and Physical has been reviewed and is unchanged from recent office visit. Plan for left arm avf vs avg.   Inez Rosato C. Donzetta Matters, MD [facility] [place] Office: [phone] Pager: [phone]  06/23/2017, 7:25 AM

## 2017-06-23 NOTE — Telephone Encounter (Signed)
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  Needs duplex and f/u with Dr. Donzetta Matters in 6 weeks to talk about surgery for 2nd stage.   Thanks.

## 2017-06-23 NOTE — Telephone Encounter (Signed)
sch appt spk to pt 08/07/17 12pm p/o PA

## 2017-06-23 NOTE — Anesthesia Procedure Notes (Signed)
Procedure Name: LMA Insertion Date/Time: 06/23/2017 7:34 AM Performed by: Moshe Salisbury, CRNA Pre-anesthesia Checklist: Patient identified, Emergency Drugs available, Suction available and Patient being monitored Patient Re-evaluated:Patient Re-evaluated prior to induction Oxygen Delivery Method: Circle System Utilized Preoxygenation: Pre-oxygenation with 100% oxygen Induction Type: IV induction Ventilation: Mask ventilation without difficulty LMA: LMA inserted LMA Size: 4.0 Number of attempts: 1 Placement Confirmation: positive ETCO2 Tube secured with: Tape Dental Injury: Teeth and Oropharynx as per pre-operative assessment

## 2017-06-24 ENCOUNTER — Encounter (HOSPITAL_COMMUNITY): Payer: BLUE CROSS/BLUE SHIELD

## 2017-06-24 ENCOUNTER — Encounter (HOSPITAL_COMMUNITY): Payer: Self-pay | Admitting: Vascular Surgery

## 2017-07-01 ENCOUNTER — Ambulatory Visit (HOSPITAL_COMMUNITY)
Admission: RE | Admit: 2017-07-01 | Discharge: 2017-07-01 | Disposition: A | Discharge status: Home / Self Care | Payer: BLUE CROSS/BLUE SHIELD | Source: Ambulatory Visit | Attending: Nephrology | Admitting: Nephrology

## 2017-07-01 VITALS — BP 144/82 | HR 70 | Temp 98.6°F | Resp 18

## 2017-07-01 DIAGNOSIS — Z5181 Encounter for therapeutic drug level monitoring: Secondary | ICD-10-CM | POA: Diagnosis not present

## 2017-07-01 DIAGNOSIS — Z79899 Other long term (current) drug therapy: Secondary | ICD-10-CM | POA: Insufficient documentation

## 2017-07-01 DIAGNOSIS — D631 Anemia in chronic kidney disease: Secondary | ICD-10-CM | POA: Diagnosis not present

## 2017-07-01 DIAGNOSIS — N185 Chronic kidney disease, stage 5: Secondary | ICD-10-CM | POA: Insufficient documentation

## 2017-07-01 DIAGNOSIS — N184 Chronic kidney disease, stage 4 (severe): Secondary | ICD-10-CM

## 2017-07-01 LAB — POCT HEMOGLOBIN-HEMACUE: Hemoglobin: 11.1 g/dL — ABNORMAL LOW (ref 12.0–15.0)

## 2017-07-01 MED ORDER — EPOETIN ALFA-EPBX 10000 UNIT/ML IJ SOLN
10000.0000 [IU] | INTRAMUSCULAR | Status: DC
  Administered 2017-07-01: 10000 [IU] via SUBCUTANEOUS
  Filled 2017-07-01: qty 1

## 2017-07-01 MED ORDER — SODIUM CHLORIDE 0.9 % IV SOLN
510.0000 mg | INTRAVENOUS | Status: DC
  Administered 2017-07-01: 510 mg via INTRAVENOUS
  Filled 2017-07-01: qty 17

## 2017-07-02 ENCOUNTER — Other Ambulatory Visit: Payer: Self-pay | Admitting: Physician Assistant

## 2017-07-02 DIAGNOSIS — Q613 Polycystic kidney, unspecified: Secondary | ICD-10-CM

## 2017-07-02 IMAGING — US US RENAL
1 series · 14 of 25 positions shown · non-contrast
Comparison: CT 02/28/2016

CLINICAL DATA: Acute pyelonephritis. CT earlier today. History of
polycystic kidney disease.

EXAM:
RENAL / URINARY TRACT ULTRASOUND COMPLETE

[Series 1: us renal · 0.24mm/px · 49 acquisitions, 14 frames shown]
[im 1/49]
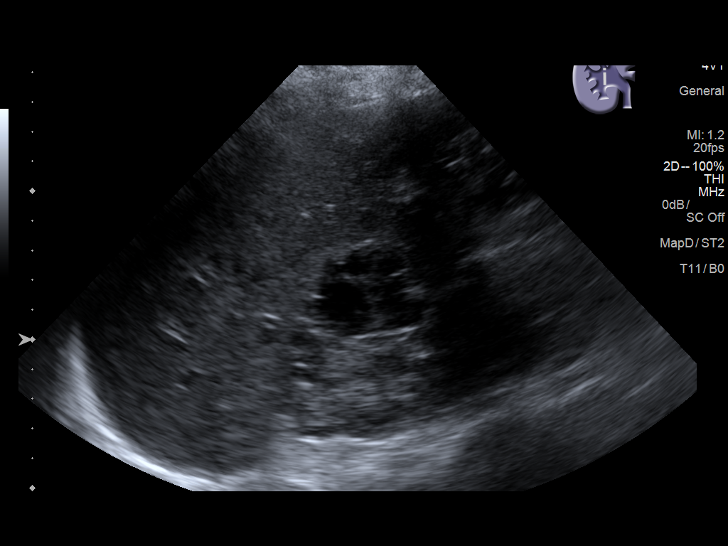
[im 5/49]
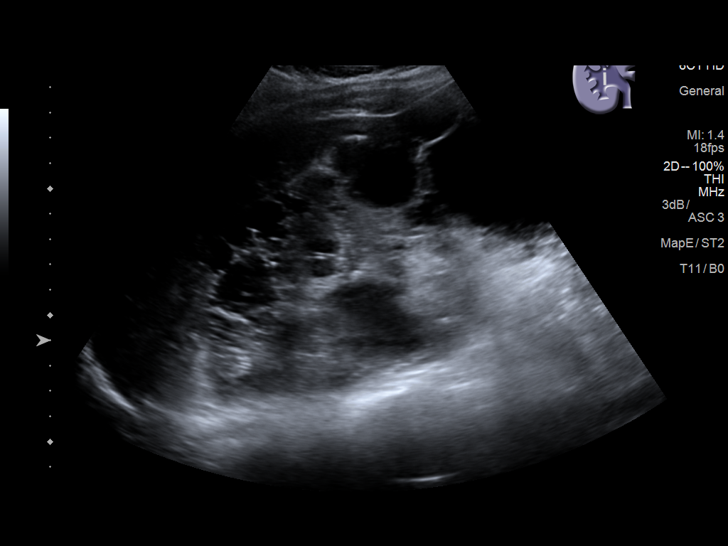
[im 9/49]
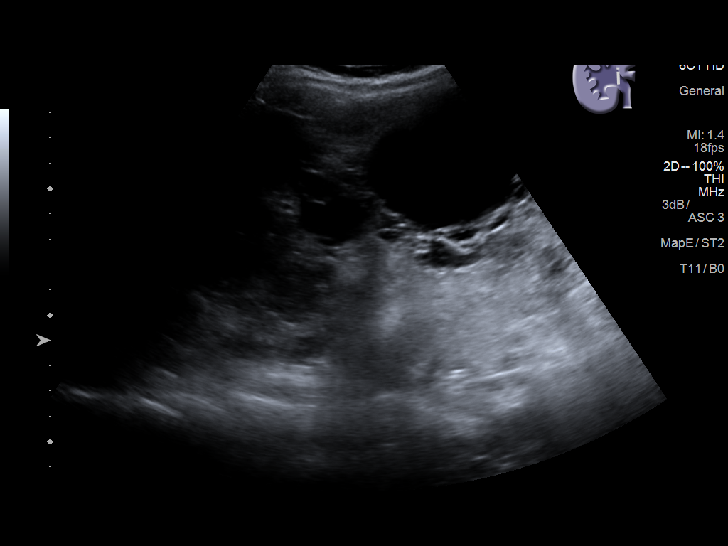
[im 13/49]
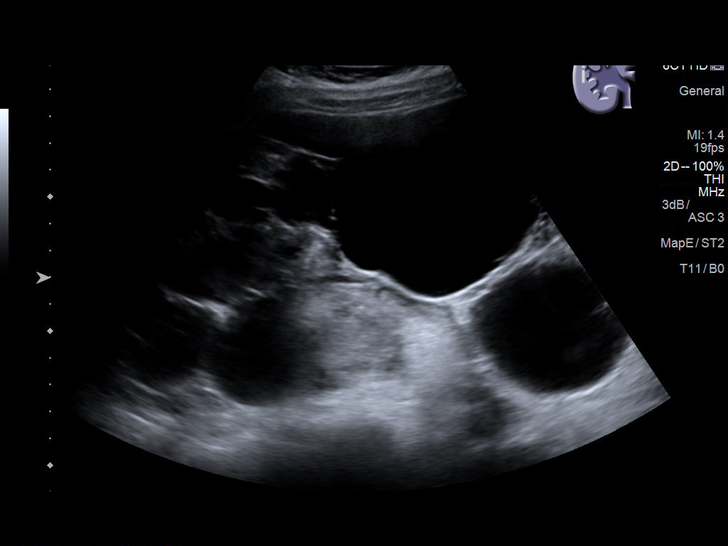
[im 17/49]
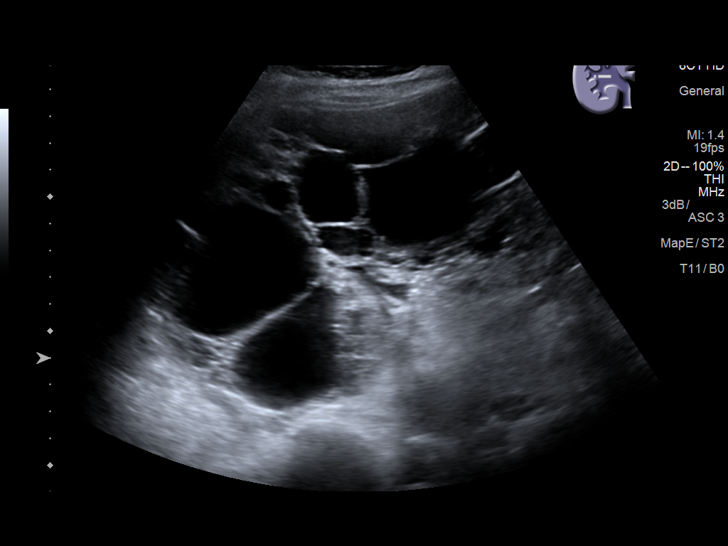
[im 19/49]
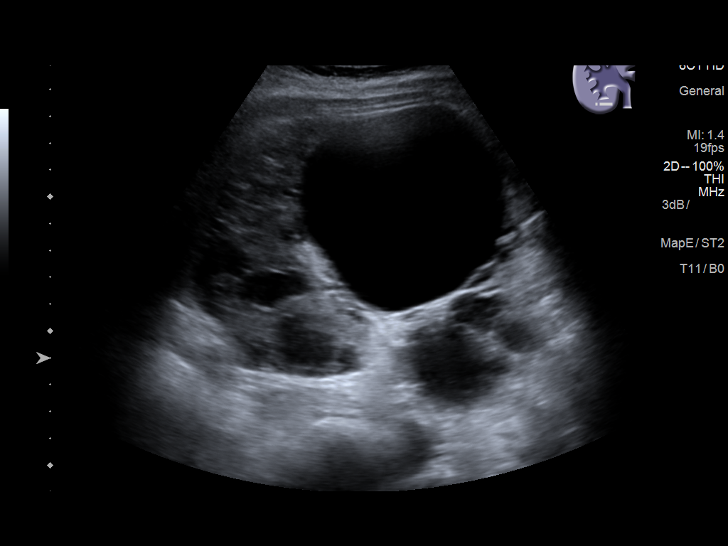
[im 23/49]
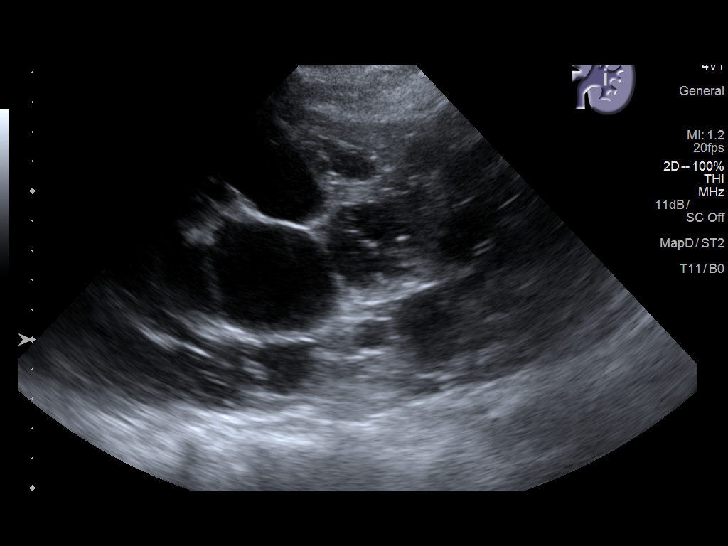
[im 27/49]
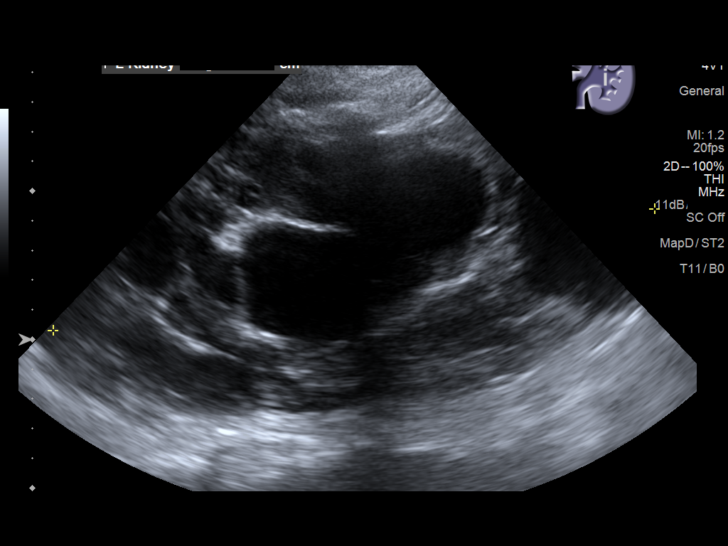
[im 31/49]
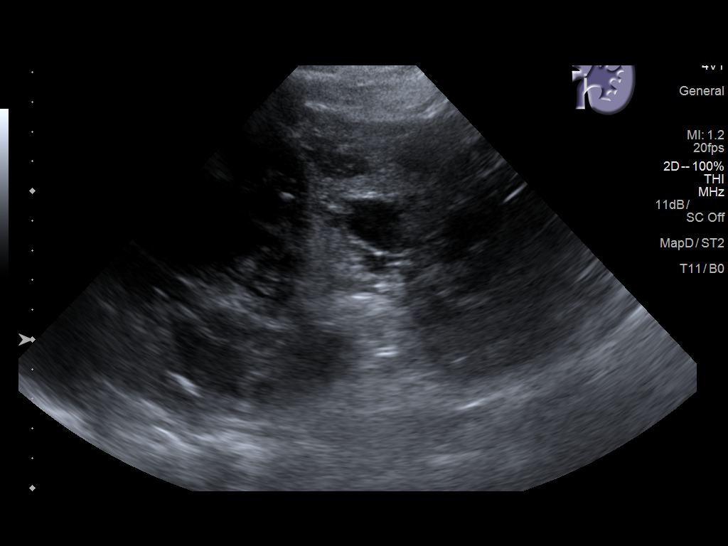
[im 33/49]
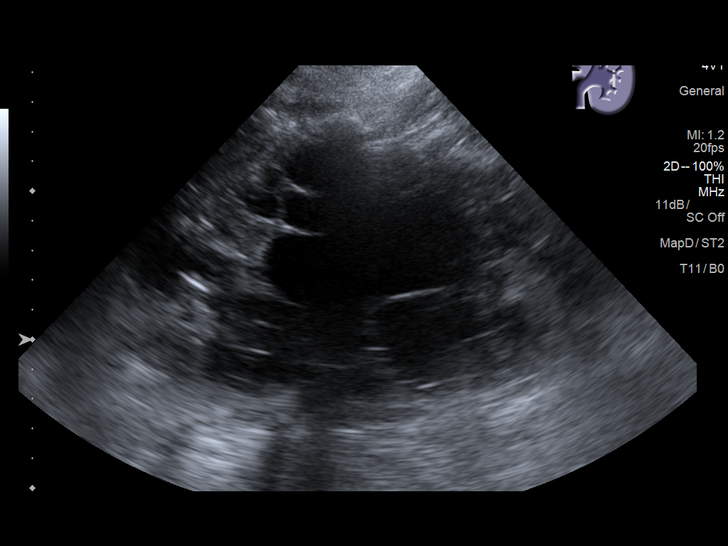
[im 37/49]
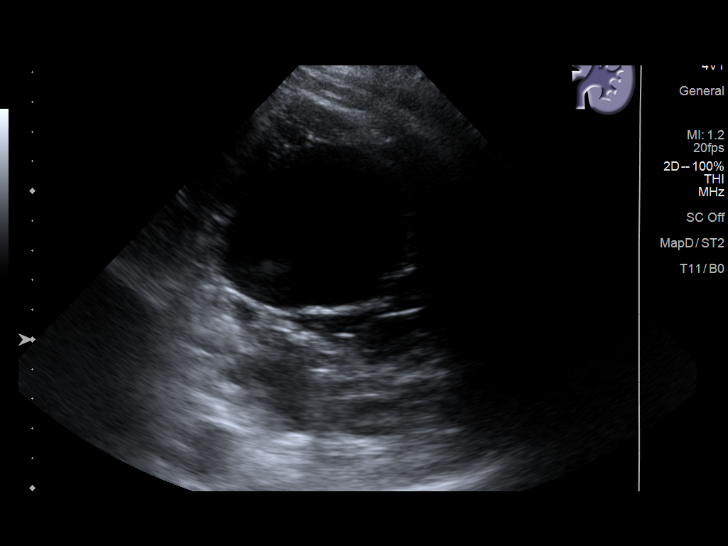
[im 41/49]
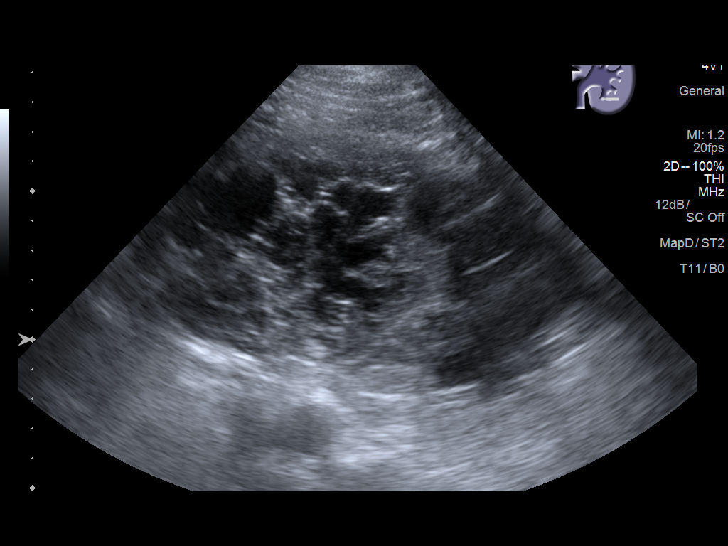
[im 45/49]
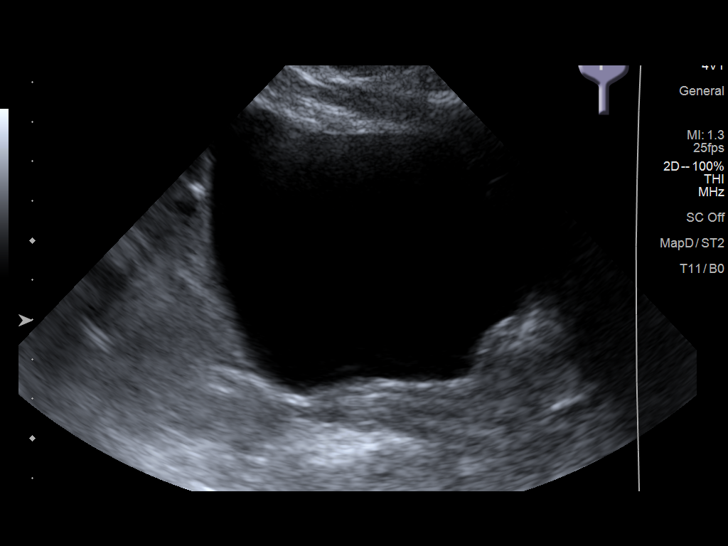
[im 49/49]
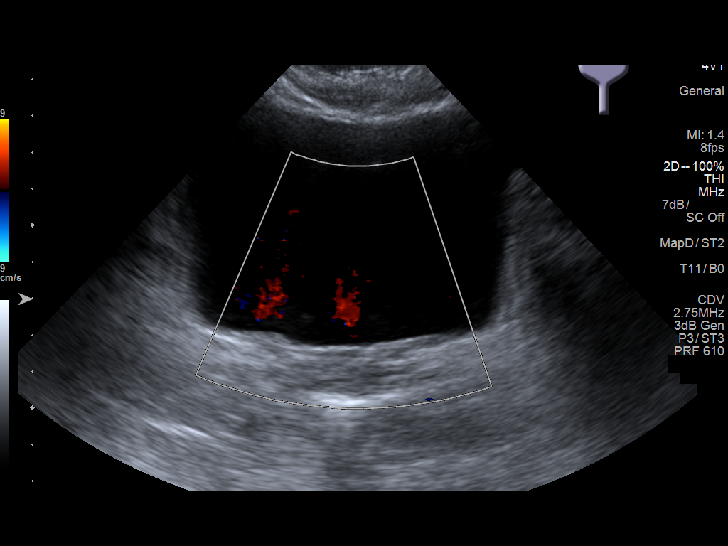

[14 of 25 positions shown; findings below may reference images not displayed]

FINDINGS: Right Kidney:

Length: 23.3 cm. Multiple cysts of variable size demonstrated
throughout the kidney, mostly filling the renal parenchyma. This is
consistent with polycystic renal disease. No hydronephrosis.

Left Kidney:

Length: 21 cm. Multiple cysts of variable size demonstrated
throughout the kidney, mostly filling the renal parenchyma. This is
consistent with polycystic renal disease. No hydronephrosis.

Bladder:

Bladder wall is not thickened and no filling defects are
demonstrated. Bilateral urine flow jets are visualized on color flow
Doppler imaging.
IMPRESSION: Multiple cysts fill the renal parenchyma bilaterally consistent with
polycystic renal disease. No hydronephrosis. Bladder appears normal.

## 2017-07-15 ENCOUNTER — Ambulatory Visit (HOSPITAL_COMMUNITY)
Admission: RE | Admit: 2017-07-15 | Discharge: 2017-07-15 | Disposition: A | Discharge status: Home / Self Care | Payer: BLUE CROSS/BLUE SHIELD | Source: Ambulatory Visit | Attending: Nephrology | Admitting: Nephrology

## 2017-07-15 VITALS — BP 144/77 | HR 73 | Temp 97.8°F | Resp 18

## 2017-07-15 DIAGNOSIS — D631 Anemia in chronic kidney disease: Secondary | ICD-10-CM | POA: Insufficient documentation

## 2017-07-15 DIAGNOSIS — N184 Chronic kidney disease, stage 4 (severe): Secondary | ICD-10-CM

## 2017-07-15 DIAGNOSIS — N185 Chronic kidney disease, stage 5: Secondary | ICD-10-CM | POA: Diagnosis not present

## 2017-07-15 LAB — IRON AND TIBC
Iron: 74 ug/dL (ref 28–170)
Saturation Ratios: 31 % (ref 10.4–31.8)
TIBC: 235 ug/dL — ABNORMAL LOW (ref 250–450)
UIBC: 161 ug/dL

## 2017-07-15 LAB — RENAL FUNCTION PANEL
Albumin: 3.3 g/dL — ABNORMAL LOW (ref 3.5–5.0)
Anion gap: 8 (ref 5–15)
BUN: 54 mg/dL — ABNORMAL HIGH (ref 6–20)
CO2: 21 mmol/L — ABNORMAL LOW (ref 22–32)
Calcium: 8.8 mg/dL — ABNORMAL LOW (ref 8.9–10.3)
Chloride: 110 mmol/L (ref 98–111)
Creatinine, Ser: 5.76 mg/dL — ABNORMAL HIGH (ref 0.44–1.00)
GFR calc Af Amer: 9 mL/min — ABNORMAL LOW (ref >60)
GFR calc non Af Amer: 7 mL/min — ABNORMAL LOW (ref >60)
Glucose, Bld: 108 mg/dL — ABNORMAL HIGH (ref 70–99)
Phosphorus: 5.6 mg/dL — ABNORMAL HIGH (ref 2.5–4.6)
Potassium: 5 mmol/L (ref 3.5–5.1)
Sodium: 139 mmol/L (ref 135–145)

## 2017-07-15 LAB — MAGNESIUM: Magnesium: 2.2 mg/dL (ref 1.7–2.4)

## 2017-07-15 LAB — POCT HEMOGLOBIN-HEMACUE: Hemoglobin: 11 g/dL — ABNORMAL LOW (ref 12.0–15.0)

## 2017-07-15 LAB — FERRITIN: Ferritin: 330 ng/mL — ABNORMAL HIGH (ref 11–307)

## 2017-07-15 MED ORDER — EPOETIN ALFA-EPBX 10000 UNIT/ML IJ SOLN
10000.0000 [IU] | INTRAMUSCULAR | Status: DC
  Administered 2017-07-15: 10000 [IU] via SUBCUTANEOUS
  Filled 2017-07-15: qty 1

## 2017-07-24 ENCOUNTER — Other Ambulatory Visit: Payer: Self-pay

## 2017-07-24 DIAGNOSIS — N184 Chronic kidney disease, stage 4 (severe): Secondary | ICD-10-CM

## 2017-07-24 DIAGNOSIS — Z48812 Encounter for surgical aftercare following surgery on the circulatory system: Secondary | ICD-10-CM

## 2017-07-29 ENCOUNTER — Ambulatory Visit (HOSPITAL_COMMUNITY)
Admission: RE | Admit: 2017-07-29 | Discharge: 2017-07-29 | Disposition: A | Discharge status: Home / Self Care | Payer: BLUE CROSS/BLUE SHIELD | Source: Ambulatory Visit | Attending: Nephrology | Admitting: Nephrology

## 2017-07-29 VITALS — BP 122/71 | HR 78 | Temp 99.3°F | Resp 15 | Ht 66.0 in | Wt 183.0 lb

## 2017-07-29 DIAGNOSIS — N184 Chronic kidney disease, stage 4 (severe): Secondary | ICD-10-CM

## 2017-07-29 DIAGNOSIS — N185 Chronic kidney disease, stage 5: Secondary | ICD-10-CM | POA: Diagnosis not present

## 2017-07-29 DIAGNOSIS — D631 Anemia in chronic kidney disease: Secondary | ICD-10-CM | POA: Diagnosis not present

## 2017-07-29 LAB — POCT HEMOGLOBIN-HEMACUE: Hemoglobin: 11.5 g/dL — ABNORMAL LOW (ref 12.0–15.0)

## 2017-07-29 MED ORDER — EPOETIN ALFA-EPBX 10000 UNIT/ML IJ SOLN
10000.0000 [IU] | INTRAMUSCULAR | Status: DC
  Administered 2017-07-29: 10000 [IU] via SUBCUTANEOUS
  Filled 2017-07-29: qty 1
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
## 2017-08-05 ENCOUNTER — Ambulatory Visit (HOSPITAL_COMMUNITY)
Admission: RE | Admit: 2017-08-05 | Discharge: 2017-08-05 | Disposition: A | Discharge status: Home / Self Care | Payer: BLUE CROSS/BLUE SHIELD | Source: Ambulatory Visit | Attending: Internal Medicine | Admitting: Internal Medicine

## 2017-08-05 ENCOUNTER — Ambulatory Visit: Payer: BLUE CROSS/BLUE SHIELD | Admitting: Internal Medicine

## 2017-08-05 ENCOUNTER — Encounter: Payer: Self-pay | Admitting: Internal Medicine

## 2017-08-05 ENCOUNTER — Other Ambulatory Visit: Payer: Self-pay

## 2017-08-05 VITALS — BP 126/87 | HR 74 | Temp 97.7°F | Ht 66.0 in | Wt 189.9 lb

## 2017-08-05 DIAGNOSIS — I770 Arteriovenous fistula, acquired: Secondary | ICD-10-CM

## 2017-08-05 DIAGNOSIS — Z791 Long term (current) use of non-steroidal anti-inflammatories (NSAID): Secondary | ICD-10-CM

## 2017-08-05 DIAGNOSIS — E782 Mixed hyperlipidemia: Secondary | ICD-10-CM

## 2017-08-05 DIAGNOSIS — Z79891 Long term (current) use of opiate analgesic: Secondary | ICD-10-CM

## 2017-08-05 DIAGNOSIS — G8929 Other chronic pain: Secondary | ICD-10-CM

## 2017-08-05 DIAGNOSIS — I120 Hypertensive chronic kidney disease with stage 5 chronic kidney disease or end stage renal disease: Secondary | ICD-10-CM

## 2017-08-05 DIAGNOSIS — I10 Essential (primary) hypertension: Secondary | ICD-10-CM

## 2017-08-05 DIAGNOSIS — I498 Other specified cardiac arrhythmias: Secondary | ICD-10-CM | POA: Insufficient documentation

## 2017-08-05 DIAGNOSIS — Z79899 Other long term (current) drug therapy: Secondary | ICD-10-CM

## 2017-08-05 DIAGNOSIS — R110 Nausea: Secondary | ICD-10-CM | POA: Diagnosis not present

## 2017-08-05 DIAGNOSIS — N816 Rectocele: Secondary | ICD-10-CM

## 2017-08-05 DIAGNOSIS — Q613 Polycystic kidney, unspecified: Secondary | ICD-10-CM | POA: Insufficient documentation

## 2017-08-05 DIAGNOSIS — N185 Chronic kidney disease, stage 5: Secondary | ICD-10-CM

## 2017-08-05 DIAGNOSIS — R159 Full incontinence of feces: Secondary | ICD-10-CM

## 2017-08-05 DIAGNOSIS — R109 Unspecified abdominal pain: Secondary | ICD-10-CM

## 2017-08-05 DIAGNOSIS — Z9889 Other specified postprocedural states: Secondary | ICD-10-CM

## 2017-08-05 DIAGNOSIS — N390 Urinary tract infection, site not specified: Secondary | ICD-10-CM

## 2017-08-05 MED ORDER — ONDANSETRON 4 MG PO TBDP: 4.0000 mg | ORAL_TABLET | Freq: Three times a day (TID) | ORAL | 0 refills | Status: DC | PRN

## 2017-08-05 MED ORDER — PROMETHAZINE HCL 25 MG PO TABS: 12.5000 mg | ORAL_TABLET | Freq: Four times a day (QID) | ORAL | 1 refills | Status: DC | PRN

## 2017-08-05 NOTE — Patient Instructions (Addendum)
For your swelling, you can take an extra fluid pill every as needed; do not take an extra one every day.  For your nausea, I have sent in to try zofran which should not cause as much drowsiness. If it works well, give [place] a call and I'll send in more.   The clinic will call you in about a week with your urine results and about starting tramadol.

## 2017-08-07 ENCOUNTER — Encounter: Payer: Self-pay | Admitting: Vascular Surgery

## 2017-08-07 ENCOUNTER — Ambulatory Visit (INDEPENDENT_AMBULATORY_CARE_PROVIDER_SITE_OTHER): Payer: Self-pay | Admitting: Vascular Surgery

## 2017-08-07 ENCOUNTER — Ambulatory Visit (HOSPITAL_COMMUNITY)
Admission: RE | Admit: 2017-08-07 | Discharge: 2017-08-07 | Disposition: A | Discharge status: Home / Self Care | Payer: BLUE CROSS/BLUE SHIELD | Source: Ambulatory Visit | Attending: Vascular Surgery | Admitting: Vascular Surgery

## 2017-08-07 VITALS — BP 147/89 | HR 64 | Temp 97.0°F | Resp 18 | Ht 66.0 in | Wt 186.0 lb

## 2017-08-07 DIAGNOSIS — N184 Chronic kidney disease, stage 4 (severe): Secondary | ICD-10-CM

## 2017-08-07 DIAGNOSIS — I770 Arteriovenous fistula, acquired: Secondary | ICD-10-CM | POA: Insufficient documentation
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
## 2017-08-08 ENCOUNTER — Encounter: Payer: Self-pay | Admitting: Internal Medicine

## 2017-08-08 DIAGNOSIS — N816 Rectocele: Secondary | ICD-10-CM | POA: Insufficient documentation

## 2017-08-08 NOTE — Assessment & Plan Note (Addendum)
Chronic, related to her PCKD. She takes PRN phenergan 12.5mg  daily which helps control the nausea but does make her drowsy. She was getting tramadol from nephrology to manage her abd pain related to significantly enlarged kidneys from PCKD; unfortunately has not been able to get this regularly. She states that she uses tramadol when she has it about three times a week. She denies increased sleepiness with it, constipation, and it allows her to continue performing her job and complete chores at her house. She denies significant EtOH use, and denies illicit drug use.  Controlled substances database reviewed 08/05/2017 and appropriate for tramadol (from nephrology) and post-op short courses of opioids from her surgeons.  Patient in agreement with starting pain contract with our clinic. We went over the details of the contract and she received a signed copy.   Plan: --[facility] pain contract signed --f/u tox screen; will send in toradol 50mg  daily PRN #20 per month if appropriate --f/u in 3 months --discussed with Dr. Beryle Beams about starting chronic controlled substances --for nausea, refilled phenergan 12.5mg  daily PRN; also gave trial of zofran (EKG obtained today with QTc wnl, SR, no ischemic changes - personally reviewed) as it is less likely to make patient drowsy; will call in about 1 week to see if it is effective.

## 2017-08-08 NOTE — Assessment & Plan Note (Signed)
Patient had a failed L AVF and a second one placed last month; she states it has been healing well and denies problems with it. She denies pain in her arm, tingling, swelling, or weakness. She was placed on lasix 40mg  daily for LE swelling, which she states initially worked well, however in the past week or so, she has noticed a little refractory swelling. She is still having good urine output after taking lasix.   On exam she has no palpable thrill or audible bruit over new AVF site signifying this has also likely failed. She has f/u with vascular surgery soon. She has mild LE edema that is not being controlled by current lasix dosing.   Plan: --will have her take an extra lasix PRN, not to take on consecutive days --f/u VS recs --f/u nephro recs

## 2017-08-08 NOTE — Assessment & Plan Note (Signed)
Well controlled.  Plan: --continue coreg 6.25mg  BID

## 2017-08-08 NOTE — Assessment & Plan Note (Signed)
Patient with h/o rectocele s/p repair 12/2016, however she states she has persistent symptoms that have not changed any before or after surgery. She has not discussed this with her urogyn surgeon yet.  Plan: --asked patient to schedule appt with urogyn

## 2017-08-10 LAB — TOXASSURE SELECT,+ANTIDEPR,UR

## 2017-08-10 NOTE — Progress Notes (Signed)
Medicine attending: Medical history, presenting problems, physical findings, and medications, reviewed with resident physician Dr Gorica Svalina on the day of the patient visit and I concur with her evaluation and management plan. 

## 2017-08-11 ENCOUNTER — Other Ambulatory Visit: Payer: Self-pay | Admitting: Vascular Surgery

## 2017-08-12 ENCOUNTER — Other Ambulatory Visit: Payer: Self-pay | Admitting: Vascular Surgery

## 2017-08-12 ENCOUNTER — Encounter (HOSPITAL_COMMUNITY): Payer: BLUE CROSS/BLUE SHIELD

## 2017-08-12 ENCOUNTER — Ambulatory Visit (HOSPITAL_COMMUNITY)
Admission: RE | Admit: 2017-08-12 | Discharge: 2017-08-12 | Disposition: A | Discharge status: Home / Self Care | Payer: BLUE CROSS/BLUE SHIELD | Source: Ambulatory Visit | Attending: Nephrology | Admitting: Nephrology

## 2017-08-12 VITALS — BP 148/89 | HR 76 | Temp 97.9°F | Ht 66.0 in | Wt 189.0 lb

## 2017-08-12 DIAGNOSIS — N185 Chronic kidney disease, stage 5: Secondary | ICD-10-CM | POA: Diagnosis not present

## 2017-08-12 DIAGNOSIS — N2581 Secondary hyperparathyroidism of renal origin: Secondary | ICD-10-CM | POA: Diagnosis not present

## 2017-08-12 DIAGNOSIS — D631 Anemia in chronic kidney disease: Secondary | ICD-10-CM | POA: Diagnosis not present

## 2017-08-12 DIAGNOSIS — I120 Hypertensive chronic kidney disease with stage 5 chronic kidney disease or end stage renal disease: Secondary | ICD-10-CM | POA: Diagnosis not present

## 2017-08-12 DIAGNOSIS — N189 Chronic kidney disease, unspecified: Secondary | ICD-10-CM | POA: Insufficient documentation

## 2017-08-12 LAB — RENAL FUNCTION PANEL
Albumin: 3.3 g/dL — ABNORMAL LOW (ref 3.5–5.0)
Anion gap: 9 (ref 5–15)
BUN: 63 mg/dL — ABNORMAL HIGH (ref 6–20)
CO2: 20 mmol/L — ABNORMAL LOW (ref 22–32)
Calcium: 8.4 mg/dL — ABNORMAL LOW (ref 8.9–10.3)
Chloride: 115 mmol/L — ABNORMAL HIGH (ref 98–111)
Creatinine, Ser: 5.58 mg/dL — ABNORMAL HIGH (ref 0.44–1.00)
GFR calc Af Amer: 9 mL/min — ABNORMAL LOW (ref >60)
GFR calc non Af Amer: 8 mL/min — ABNORMAL LOW (ref >60)
Glucose, Bld: 133 mg/dL — ABNORMAL HIGH (ref 70–99)
Phosphorus: 5 mg/dL — ABNORMAL HIGH (ref 2.5–4.6)
Potassium: 4.8 mmol/L (ref 3.5–5.1)
Sodium: 144 mmol/L (ref 135–145)

## 2017-08-12 LAB — IRON AND TIBC
Iron: 56 ug/dL (ref 28–170)
Saturation Ratios: 23 % (ref 10.4–31.8)
TIBC: 244 ug/dL — ABNORMAL LOW (ref 250–450)
UIBC: 188 ug/dL

## 2017-08-12 LAB — FERRITIN: Ferritin: 164 ng/mL (ref 11–307)

## 2017-08-12 LAB — MAGNESIUM: Magnesium: 2.3 mg/dL (ref 1.7–2.4)

## 2017-08-12 LAB — POCT HEMOGLOBIN-HEMACUE: Hemoglobin: 11.1 g/dL — ABNORMAL LOW (ref 12.0–15.0)

## 2017-08-12 MED ORDER — EPOETIN ALFA-EPBX 10000 UNIT/ML IJ SOLN
10000.0000 [IU] | INTRAMUSCULAR | Status: DC
  Administered 2017-08-12: 10000 [IU] via SUBCUTANEOUS
  Filled 2017-08-12: qty 1

## 2017-08-18 DIAGNOSIS — N390 Urinary tract infection, site not specified: Secondary | ICD-10-CM | POA: Diagnosis not present

## 2017-08-19 ENCOUNTER — Encounter (HOSPITAL_COMMUNITY): Payer: Self-pay | Admitting: *Deleted

## 2017-08-19 ENCOUNTER — Telehealth: Payer: Self-pay | Admitting: Internal Medicine

## 2017-08-19 ENCOUNTER — Other Ambulatory Visit: Payer: Self-pay

## 2017-08-19 MED ORDER — TRAMADOL HCL 50 MG PO TABS: 50.0000 mg | ORAL_TABLET | Freq: Every day | ORAL | 0 refills | Status: DC | PRN

## 2017-08-19 NOTE — Progress Notes (Signed)
Spoke with pt for pre-op call. Pt denies cardiac history or diabetes.  

## 2017-08-19 NOTE — Telephone Encounter (Signed)
Utox results appropriate. Attempted reaching patient to tell her of results and that I will be sending in her Tramadol prescription to her pharmacy as discussed at last visit.   3x script for tramadol 50mg  daily prn #20  Alphonzo Grieve, MD IMTS - PGY3

## 2017-08-20 ENCOUNTER — Ambulatory Visit (HOSPITAL_COMMUNITY): Payer: BLUE CROSS/BLUE SHIELD | Admitting: Anesthesiology

## 2017-08-20 ENCOUNTER — Encounter (HOSPITAL_COMMUNITY): Payer: Self-pay

## 2017-08-20 ENCOUNTER — Encounter (HOSPITAL_COMMUNITY)
Admission: RE | Disposition: A | Discharge status: Home / Self Care | Payer: Self-pay | Source: Ambulatory Visit | Attending: Vascular Surgery

## 2017-08-20 ENCOUNTER — Ambulatory Visit (HOSPITAL_COMMUNITY)
Admission: RE | Admit: 2017-08-20 | Discharge: 2017-08-20 | Disposition: A | Discharge status: Home / Self Care | Payer: BLUE CROSS/BLUE SHIELD | Source: Ambulatory Visit | Attending: Vascular Surgery | Admitting: Vascular Surgery

## 2017-08-20 DIAGNOSIS — N186 End stage renal disease: Secondary | ICD-10-CM | POA: Insufficient documentation

## 2017-08-20 DIAGNOSIS — M1990 Unspecified osteoarthritis, unspecified site: Secondary | ICD-10-CM | POA: Diagnosis not present

## 2017-08-20 DIAGNOSIS — I120 Hypertensive chronic kidney disease with stage 5 chronic kidney disease or end stage renal disease: Secondary | ICD-10-CM | POA: Diagnosis not present

## 2017-08-20 DIAGNOSIS — Z6830 Body mass index (BMI) 30.0-30.9, adult: Secondary | ICD-10-CM | POA: Diagnosis not present

## 2017-08-20 DIAGNOSIS — K219 Gastro-esophageal reflux disease without esophagitis: Secondary | ICD-10-CM | POA: Insufficient documentation

## 2017-08-20 DIAGNOSIS — Z992 Dependence on renal dialysis: Secondary | ICD-10-CM | POA: Insufficient documentation

## 2017-08-20 DIAGNOSIS — E669 Obesity, unspecified: Secondary | ICD-10-CM | POA: Insufficient documentation

## 2017-08-20 DIAGNOSIS — D631 Anemia in chronic kidney disease: Secondary | ICD-10-CM | POA: Diagnosis not present

## 2017-08-20 DIAGNOSIS — G4730 Sleep apnea, unspecified: Secondary | ICD-10-CM | POA: Insufficient documentation

## 2017-08-20 DIAGNOSIS — Z87891 Personal history of nicotine dependence: Secondary | ICD-10-CM | POA: Insufficient documentation

## 2017-08-20 DIAGNOSIS — N184 Chronic kidney disease, stage 4 (severe): Secondary | ICD-10-CM | POA: Diagnosis not present

## 2017-08-20 DIAGNOSIS — N185 Chronic kidney disease, stage 5: Secondary | ICD-10-CM | POA: Diagnosis not present

## 2017-08-20 DIAGNOSIS — I739 Peripheral vascular disease, unspecified: Secondary | ICD-10-CM | POA: Diagnosis not present

## 2017-08-20 HISTORY — DX: Personal history of other diseases of the digestive system: Z87.19

## 2017-08-20 HISTORY — PX: AV FISTULA PLACEMENT: SHX1204

## 2017-08-20 LAB — POCT I-STAT 4, (NA,K, GLUC, HGB,HCT)
Glucose, Bld: 95 mg/dL (ref 70–99)
HCT: 36 % (ref 36.0–46.0)
Hemoglobin: 12.2 g/dL (ref 12.0–15.0)
Potassium: 4.8 mmol/L (ref 3.5–5.1)
Sodium: 140 mmol/L (ref 135–145)

## 2017-08-20 SURGERY — INSERTION OF ARTERIOVENOUS (AV) GORE-TEX GRAFT ARM
Anesthesia: General | Site: Arm Upper | Laterality: Left

## 2017-08-20 MED ORDER — EPHEDRINE SULFATE-NACL 50-0.9 MG/10ML-% IV SOSY
PREFILLED_SYRINGE | INTRAVENOUS | Status: DC | PRN
  Administered 2017-08-20: 5 mg via INTRAVENOUS

## 2017-08-20 MED ORDER — LIDOCAINE-EPINEPHRINE (PF) 1 %-1:200000 IJ SOLN
INTRAMUSCULAR | Status: DC | PRN
  Administered 2017-08-20: 10 mL

## 2017-08-20 MED ORDER — SODIUM CHLORIDE 0.9 % IV SOLN
INTRAVENOUS | Status: DC
  Administered 2017-08-20: 14:00:00 via INTRAVENOUS

## 2017-08-20 MED ORDER — DEXAMETHASONE SODIUM PHOSPHATE 10 MG/ML IJ SOLN
INTRAMUSCULAR | Status: AC
  Filled 2017-08-20: qty 1

## 2017-08-20 MED ORDER — SODIUM CHLORIDE 0.9 % IV SOLN: INTRAVENOUS | Status: DC

## 2017-08-20 MED ORDER — PHENYLEPHRINE HCL 10 MG/ML IJ SOLN
INTRAMUSCULAR | Status: AC
  Filled 2017-08-20: qty 1

## 2017-08-20 MED ORDER — FENTANYL CITRATE (PF) 100 MCG/2ML IJ SOLN
INTRAMUSCULAR | Status: AC
  Filled 2017-08-20: qty 2

## 2017-08-20 MED ORDER — HEMOSTATIC AGENTS (NO CHARGE) OPTIME
TOPICAL | Status: DC | PRN
  Administered 2017-08-20: 1 via TOPICAL

## 2017-08-20 MED ORDER — FENTANYL CITRATE (PF) 250 MCG/5ML IJ SOLN
INTRAMUSCULAR | Status: AC
  Filled 2017-08-20: qty 5

## 2017-08-20 MED ORDER — ONDANSETRON HCL 4 MG/2ML IJ SOLN
INTRAMUSCULAR | Status: DC | PRN
  Administered 2017-08-20: 4 mg via INTRAVENOUS

## 2017-08-20 MED ORDER — 0.9 % SODIUM CHLORIDE (POUR BTL) OPTIME
TOPICAL | Status: DC | PRN
  Administered 2017-08-20: 1000 mL

## 2017-08-20 MED ORDER — FENTANYL CITRATE (PF) 250 MCG/5ML IJ SOLN
INTRAMUSCULAR | Status: DC | PRN
  Administered 2017-08-20: 25 ug via INTRAVENOUS
  Administered 2017-08-20: 50 ug via INTRAVENOUS
  Administered 2017-08-20: 25 ug via INTRAVENOUS
  Administered 2017-08-20: 50 ug via INTRAVENOUS

## 2017-08-20 MED ORDER — CHLORHEXIDINE GLUCONATE CLOTH 2 % EX PADS: 6.0000 | MEDICATED_PAD | Freq: Once | CUTANEOUS | Status: DC

## 2017-08-20 MED ORDER — MIDAZOLAM HCL 2 MG/2ML IJ SOLN
INTRAMUSCULAR | Status: DC | PRN
  Administered 2017-08-20: 2 mg via INTRAVENOUS

## 2017-08-20 MED ORDER — OXYCODONE HCL 5 MG PO TABS: 5.0000 mg | ORAL_TABLET | Freq: Four times a day (QID) | ORAL | 0 refills | Status: DC | PRN

## 2017-08-20 MED ORDER — FENTANYL CITRATE (PF) 100 MCG/2ML IJ SOLN
INTRAMUSCULAR | Status: AC
  Administered 2017-08-20: 50 ug via INTRAVENOUS
  Filled 2017-08-20: qty 2

## 2017-08-20 MED ORDER — SODIUM CHLORIDE 0.9 % IV SOLN
INTRAVENOUS | Status: DC | PRN
  Administered 2017-08-20: 20 ug/min via INTRAVENOUS

## 2017-08-20 MED ORDER — PROTAMINE SULFATE 10 MG/ML IV SOLN
INTRAVENOUS | Status: AC
  Filled 2017-08-20: qty 5

## 2017-08-20 MED ORDER — SODIUM CHLORIDE 0.9 % IV SOLN
INTRAVENOUS | Status: AC
  Filled 2017-08-20: qty 1.2

## 2017-08-20 MED ORDER — MIDAZOLAM HCL 2 MG/2ML IJ SOLN
INTRAMUSCULAR | Status: AC
  Filled 2017-08-20: qty 2

## 2017-08-20 MED ORDER — PROPOFOL 10 MG/ML IV BOLUS
INTRAVENOUS | Status: AC
  Filled 2017-08-20: qty 20

## 2017-08-20 MED ORDER — ESMOLOL HCL 100 MG/10ML IV SOLN
INTRAVENOUS | Status: AC
  Filled 2017-08-20: qty 10

## 2017-08-20 MED ORDER — LIDOCAINE 2% (20 MG/ML) 5 ML SYRINGE
INTRAMUSCULAR | Status: DC | PRN
  Administered 2017-08-20: 40 mg via INTRAVENOUS

## 2017-08-20 MED ORDER — LIDOCAINE 2% (20 MG/ML) 5 ML SYRINGE
INTRAMUSCULAR | Status: AC
  Filled 2017-08-20: qty 15

## 2017-08-20 MED ORDER — ONDANSETRON HCL 4 MG/2ML IJ SOLN
4.0000 mg | Freq: Once | INTRAMUSCULAR | Status: AC | PRN
  Administered 2017-08-20: 4 mg via INTRAVENOUS

## 2017-08-20 MED ORDER — LIDOCAINE-EPINEPHRINE (PF) 1 %-1:200000 IJ SOLN
INTRAMUSCULAR | Status: AC
  Filled 2017-08-20: qty 30

## 2017-08-20 MED ORDER — FENTANYL CITRATE (PF) 100 MCG/2ML IJ SOLN
25.0000 ug | INTRAMUSCULAR | Status: DC | PRN
  Administered 2017-08-20 (×3): 50 ug via INTRAVENOUS

## 2017-08-20 MED ORDER — PROPOFOL 10 MG/ML IV BOLUS
INTRAVENOUS | Status: DC | PRN
  Administered 2017-08-20: 160 mg via INTRAVENOUS

## 2017-08-20 MED ORDER — ONDANSETRON HCL 4 MG/2ML IJ SOLN
INTRAMUSCULAR | Status: AC
  Administered 2017-08-20: 4 mg via INTRAVENOUS
  Filled 2017-08-20: qty 2

## 2017-08-20 MED ORDER — SUGAMMADEX SODIUM 200 MG/2ML IV SOLN
INTRAVENOUS | Status: AC
  Filled 2017-08-20: qty 2

## 2017-08-20 MED ORDER — CEFAZOLIN SODIUM-DEXTROSE 2-4 GM/100ML-% IV SOLN
2.0000 g | INTRAVENOUS | Status: AC
  Administered 2017-08-20: 2 g via INTRAVENOUS

## 2017-08-20 MED ORDER — MEPERIDINE HCL 50 MG/ML IJ SOLN: 6.2500 mg | INTRAMUSCULAR | Status: DC | PRN

## 2017-08-20 MED ORDER — HEPARIN SODIUM (PORCINE) 5000 UNIT/ML IJ SOLN
INTRAMUSCULAR | Status: DC | PRN
  Administered 2017-08-20: 500 mL

## 2017-08-20 SURGICAL SUPPLY — 34 items
ADH SKN CLS APL DERMABOND .7 (GAUZE/BANDAGES/DRESSINGS) ×1
ARMBAND PINK RESTRICT EXTREMIT (MISCELLANEOUS) ×4 IMPLANT
CANISTER SUCT 3000ML PPV (MISCELLANEOUS) ×2 IMPLANT
CLIP VESOCCLUDE MED 6/CT (CLIP) ×2 IMPLANT
CLIP VESOCCLUDE SM WIDE 6/CT (CLIP) ×2 IMPLANT
DERMABOND ADVANCED (GAUZE/BANDAGES/DRESSINGS) ×1
DERMABOND ADVANCED .7 DNX12 (GAUZE/BANDAGES/DRESSINGS) ×1 IMPLANT
ELECT REM PT RETURN 9FT ADLT (ELECTROSURGICAL) ×2
ELECTRODE REM PT RTRN 9FT ADLT (ELECTROSURGICAL) ×1 IMPLANT
GLOVE BIO SURGEON STRL SZ7.5 (GLOVE) ×2 IMPLANT
GOWN STRL REUS W/ TWL LRG LVL3 (GOWN DISPOSABLE) ×2 IMPLANT
GOWN STRL REUS W/ TWL XL LVL3 (GOWN DISPOSABLE) ×1 IMPLANT
GOWN STRL REUS W/TWL LRG LVL3 (GOWN DISPOSABLE) ×4
GOWN STRL REUS W/TWL XL LVL3 (GOWN DISPOSABLE) ×2
GRAFT GORETEX STRT 4-7X45 (Vascular Products) ×1 IMPLANT
HEMOSTAT SNOW SURGICEL 2X4 (HEMOSTASIS) ×1 IMPLANT
INSERT FOGARTY SM (MISCELLANEOUS) ×3 IMPLANT
KIT BASIN OR (CUSTOM PROCEDURE TRAY) ×2 IMPLANT
KIT TURNOVER KIT B (KITS) ×2 IMPLANT
NS IRRIG 1000ML POUR BTL (IV SOLUTION) ×2 IMPLANT
PACK CV ACCESS (CUSTOM PROCEDURE TRAY) ×2 IMPLANT
PAD ARMBOARD 7.5X6 YLW CONV (MISCELLANEOUS) ×4 IMPLANT
SUT GORETEX 6.0 TH-9 30 IN (SUTURE) IMPLANT
SUT GORETEX CV-6TTC-13 36IN (SUTURE) IMPLANT
SUT MNCRL AB 4-0 PS2 18 (SUTURE) ×2 IMPLANT
SUT PROLENE 5 0 C 1 24 (SUTURE) ×2 IMPLANT
SUT PROLENE 6 0 BV (SUTURE) ×2 IMPLANT
SUT SILK 2 0 SH (SUTURE) ×1 IMPLANT
SUT VIC AB 3-0 SH 27 (SUTURE) ×4
SUT VIC AB 3-0 SH 27X BRD (SUTURE) ×2 IMPLANT
SYR TOOMEY 50ML (SYRINGE) IMPLANT
TOWEL GREEN STERILE (TOWEL DISPOSABLE) ×2 IMPLANT
UNDERPAD 30X30 (UNDERPADS AND DIAPERS) ×2 IMPLANT
WATER STERILE IRR 1000ML POUR (IV SOLUTION) ×2 IMPLANT

## 2017-08-20 NOTE — Op Note (Signed)
    Patient name: Kristen Moreno MRN: [id number] DOB: 08-02-1960 Sex: female  08/20/2017 Pre-operative Diagnosis: Chronic kidney disease Post-operative diagnosis:  Same Surgeon:  Eda Paschal. Donzetta Matters, MD Assistant: Laurence Slate, PA Procedure Performed: Left arm axillary loop graft with 4-7 mm stretch PTFE  Indications: 56 year old female history of chronic kidney disease.  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  She is now indicated for AV grafting as she is nearing the need for dialysis.  Findings: Axillary artery was free of disease.  Axillary vein was patent.  At completion there was a strong thrill in the graft and a radial artery signal at augmented with compression of the graft.  The venous limb of the graft is tunneled laterally and arterial limb is medial.   Procedure:  The patient was identified in the holding area and taken to the operating room where she was placed supine the operating table LMA anesthesia was induced.  She was sterilely prepped draped left upper extremity usual fashion she was given antibiotics and a timeout was called.  We began with a longitudinal incision near her axilla.  We dissected down identified her median nerve as well as axillary artery and vein.  Vesseloops were placed around both vessels.  Fingers and orientation for the graft where the venous limb would lie laterally.  I then created a counterincision until the graft so that the 4 mm and was medial.  I then clamped the vein medially and laterally beveled the graft.  The vein was opened and tingling in the graft was sewn end to side with 5-0 Prolene suture.  Prior to completion anastomosis I allowed flushing and then irrigated with heparinized saline.  I then had good backbleeding through the graft the graft was clamped through the counterincision.  Again I flushed the graft with heparinized saline.  The artery was then clamped distally and proximally opened longitudinally was  free of disease.  I irrigated with heparinized saline and flushed in both directions antegrade and retrograde.  I beveled this graft to have a anastomosis of approximately a 4 mm.  I then sewed end-to-side with 6-0 Prolene suture.  Prior to completion I allowed antegrade backbleeding from the artery as well as from the graft.  I again flushed with heparinized saline.  Upon completion there was a strong thrill in the graft and there was a strong signal in the runoff vein that diminished with compression of the graft.  I also had a good thing at the radial artery at the wrist and augmented with compression of the graft.  Satisfied with this we obtained hemostasis irrigated both her wounds and closed in layers of Vicryl Monocryl.  Dermabond was placed to level the skin.  Patient was allowed away from anesthesia without immediate complication.  All counts were correct at completion.  EBL 50 cc.   Angle Dirusso C. Donzetta Matters, MD [facility] [place] Office: [phone] Pager: [phone]

## 2017-08-20 NOTE — Anesthesia Procedure Notes (Signed)
Procedure Name: LMA Insertion Date/Time: 08/20/2017 2:18 PM Performed by: Marsa Aris, CRNA Pre-anesthesia Checklist: Patient identified, Emergency Drugs available, Suction available, Patient being monitored and Timeout performed Patient Re-evaluated:Patient Re-evaluated prior to induction Oxygen Delivery Method: Circle system utilized Preoxygenation: Pre-oxygenation with 100% oxygen Induction Type: IV induction LMA: LMA inserted LMA Size: 3.0 Number of attempts: 1 Placement Confirmation: positive ETCO2,  CO2 detector and breath sounds checked- equal and bilateral Tube secured with: Tape Dental Injury: Teeth and Oropharynx as per pre-operative assessment

## 2017-08-20 NOTE — Anesthesia Preprocedure Evaluation (Signed)
Anesthesia Evaluation  Patient identified by MRN, date of birth, ID band Patient awake    Reviewed: Allergy & Precautions, NPO status , Patient's Chart, lab work & pertinent test results  Airway Mallampati: II  TM Distance: >3 FB Neck ROM: Full    Dental no notable dental hx. (+) Teeth Intact   Pulmonary sleep apnea and Continuous Positive Airway Pressure Ventilation , pneumonia, resolved, former smoker,    Pulmonary exam normal breath sounds clear to auscultation       Cardiovascular hypertension, Pt. on medications and Pt. on home beta blockers + Peripheral Vascular Disease  Normal cardiovascular exam Rhythm:Regular Rate:Normal     Neuro/Psych negative neurological ROS  negative psych ROS   GI/Hepatic hiatal hernia, GERD  Medicated and Controlled,Rectocele   Endo/Other  Obesity  Renal/GU ESRFRenal diseasePolycystic kidney disease  negative genitourinary   Musculoskeletal  (+) Arthritis , Osteoarthritis,    Abdominal (+) + obese,   Peds  Hematology  (+) anemia ,   Anesthesia Other Findings   Reproductive/Obstetrics                             Anesthesia Physical Anesthesia Plan  ASA: IV  Anesthesia Plan: General   Post-op Pain Management:    Induction: Intravenous  PONV Risk Score and Plan: 3 and Midazolam, Ondansetron, Dexamethasone and Treatment may vary due to age or medical condition  Airway Management Planned: LMA  Additional Equipment:   Intra-op Plan:   Post-operative Plan: Extubation in OR  Informed Consent: I have reviewed the patients History and Physical, chart, labs and discussed the procedure including the risks, benefits and alternatives for the proposed anesthesia with the patient or authorized representative who has indicated his/her understanding and acceptance.   Dental advisory given  Plan Discussed with: CRNA and Surgeon  Anesthesia Plan Comments:          Anesthesia Quick Evaluation

## 2017-08-20 NOTE — Discharge Instructions (Signed)
[facility]  Discharge Instructions  AV Fistula or Graft Surgery for Dialysis Access  Please refer to the following instructions for your post-procedure care. Your surgeon or physician assistant will discuss any changes with you.  Activity  You may drive the day following your surgery, if you are comfortable and no longer taking prescription pain medication. Resume full activity as the soreness in your incision resolves.  Bathing/Showering  You may shower after you go home. Keep your incision dry for 48 hours. Do not soak in a bathtub, hot tub, or swim until the incision heals completely. You may not shower if you have a hemodialysis catheter.  Incision Care  Clean your incision with mild soap and water after 48 hours. Pat the area dry with a clean towel. You do not need a bandage unless otherwise instructed. Do not apply any ointments or creams to your incision. You may have skin glue on your incision. Do not peel it off. It will come off on its own in about one week. Your arm may swell a bit after surgery. To reduce swelling use pillows to elevate your arm so it is above your heart. Your doctor will tell you if you need to lightly wrap your arm with an ACE bandage.  Diet  Resume your normal diet. There are not special food restrictions following this procedure. In order to heal from your surgery, it is CRITICAL to get adequate nutrition. Your body requires vitamins, minerals, and protein. Vegetables are the best source of vitamins and minerals. Vegetables also provide the perfect balance of protein. Processed food has little nutritional value, so try to avoid this.  Medications  Resume taking all of your medications. If your incision is causing pain, you may take over-the counter pain relievers such as acetaminophen (Tylenol). If you were prescribed a stronger pain medication, please be aware these medications can cause nausea and constipation. Prevent  nausea by taking the medication with a snack or meal. Avoid constipation by drinking plenty of fluids and eating foods with high amount of fiber, such as fruits, vegetables, and grains. Do not take Tylenol if you are taking prescription pain medications.     Follow up Your surgeon may want to see you in the office following your access surgery. If so, this will be arranged at the time of your surgery.  Please call us immediately for any of the following conditions:  Increased pain, redness, drainage (pus) from your incision site Fever of 101 degrees or higher Severe or worsening pain at your incision site Hand pain or numbness.  Reduce your risk of vascular disease:  Stop smoking. If you would like help, call [facility] at 1-800-QUIT-NOW 431-471-8137) or [place] at [place] your cholesterol Maintain a desired weight Control your diabetes Keep your blood pressure down  Dialysis  It will take several weeks to several months for your new dialysis access to be ready for use. Your surgeon will determine when it is OK to use it. Your nephrologist will continue to direct your dialysis. You can continue to use your Permcath until your new access is ready for use.  If you have any questions, please call the office at [phone].   Post Anesthesia Home Care Instructions  Activity: Get plenty of rest for the remainder of the day. A responsible individual must stay with you for 24 hours following the procedure.  For the next 24 hours, DO NOT: -Drive a car -Paediatric nurse -Drink alcoholic  beverages -Take any medication unless instructed by your physician -Make any legal decisions or sign important papers.  Meals: Start with liquid foods such as gelatin or soup. Progress to regular foods as tolerated. Avoid greasy, spicy, heavy foods. If nausea and/or vomiting occur, drink only clear liquids until the nausea and/or vomiting subsides. Call your physician if  vomiting continues.  Special Instructions/Symptoms: Your throat may feel dry or sore from the anesthesia or the breathing tube placed in your throat during surgery. If this causes discomfort, gargle with warm salt water. The discomfort should disappear within 24 hours.  If you had a scopolamine patch placed behind your ear for the management of post- operative nausea and/or vomiting:  1. The medication in the patch is effective for 72 hours, after which it should be removed.  Wrap patch in a tissue and discard in the trash. Wash hands thoroughly with soap and water. 2. You may remove the patch earlier than 72 hours if you experience unpleasant side effects which may include dry mouth, dizziness or visual disturbances. 3. Avoid touching the patch. Wash your hands with soap and water after contact with the patch.

## 2017-08-20 NOTE — Transfer of Care (Signed)
Immediate Anesthesia Transfer of Care Note  Patient: Kristen Moreno  Procedure(s) Performed: INSERTION OF ARTERIOVENOUS (AV) GORE-TEX GRAFT LEFT upper ARM (Left Arm Upper)  Patient Location: PACU  Anesthesia Type:General  Level of Consciousness: awake, alert  and oriented  Airway & Oxygen Therapy: Patient Spontanous Breathing and Patient connected to nasal cannula oxygen  Post-op Assessment: Report given to RN and Post -op Vital signs reviewed and stable  Post vital signs: Reviewed and stable  Last Vitals:  Vitals Value Taken Time  BP 139/87 08/20/2017  3:37 PM  Temp    Pulse 76 08/20/2017  3:38 PM  Resp 20 08/20/2017  3:38 PM  SpO2 98 % 08/20/2017  3:38 PM  Vitals shown include unvalidated device data.  Last Pain:  Vitals:   08/20/17 0949  TempSrc:   PainSc: 4       Patients Stated Pain Goal: 3 (47/82/95 6213)  Complications: No apparent anesthesia complications

## 2017-08-20 NOTE — Anesthesia Postprocedure Evaluation (Signed)
Anesthesia Post Note  Patient: Licensed conveyancer  Procedure(s) Performed: INSERTION OF ARTERIOVENOUS (AV) GORE-TEX GRAFT LEFT upper ARM (Left Arm Upper)     Patient location during evaluation: PACU Anesthesia Type: General Level of consciousness: awake and alert Pain management: pain level controlled Vital Signs Assessment: post-procedure vital signs reviewed and stable Respiratory status: spontaneous breathing, nonlabored ventilation, respiratory function stable and patient connected to nasal cannula oxygen Cardiovascular status: blood pressure returned to baseline and stable Postop Assessment: no apparent nausea or vomiting Anesthetic complications: no    Last Vitals:  Vitals:   08/20/17 1620 08/20/17 1644  BP: 135/81 (!) 144/77  Pulse: 68   Resp: 15 18  Temp:    SpO2: 91% 93%    Last Pain:  Vitals:   08/20/17 1746  TempSrc:   PainSc: 10-Worst pain ever                 Yolette Hastings DAVID

## 2017-08-20 NOTE — H&P (Addendum)
   History and Physical Update  The patient was interviewed and re-examined.  The patient's previous History and Physical has been reviewed and is unchanged from recent office visit. Plan for left upper arm av graft.    Kiyonna Tortorelli C. Donzetta Matters, MD [facility] [place] Office: [phone] Pager: [phone]  08/20/2017, 10:57 AM

## 2017-08-21 ENCOUNTER — Encounter (HOSPITAL_COMMUNITY): Payer: Self-pay | Admitting: Vascular Surgery

## 2017-08-21 ENCOUNTER — Telehealth: Payer: Self-pay | Admitting: Vascular Surgery

## 2017-08-21 NOTE — Telephone Encounter (Signed)
Sched. Appt. Spoke with pt.mailed letter  09/18/17 1pm p/o PA

## 2017-08-26 ENCOUNTER — Ambulatory Visit (HOSPITAL_COMMUNITY)
Admission: RE | Admit: 2017-08-26 | Discharge: 2017-08-26 | Disposition: A | Discharge status: Home / Self Care | Payer: BLUE CROSS/BLUE SHIELD | Source: Ambulatory Visit | Attending: Nephrology | Admitting: Nephrology

## 2017-08-26 VITALS — BP 123/67 | HR 76 | Resp 18

## 2017-08-26 DIAGNOSIS — Z79899 Other long term (current) drug therapy: Secondary | ICD-10-CM | POA: Insufficient documentation

## 2017-08-26 DIAGNOSIS — Z5181 Encounter for therapeutic drug level monitoring: Secondary | ICD-10-CM | POA: Insufficient documentation

## 2017-08-26 DIAGNOSIS — N185 Chronic kidney disease, stage 5: Secondary | ICD-10-CM

## 2017-08-26 DIAGNOSIS — D631 Anemia in chronic kidney disease: Secondary | ICD-10-CM | POA: Diagnosis not present

## 2017-08-26 DIAGNOSIS — N183 Chronic kidney disease, stage 3 (moderate): Secondary | ICD-10-CM | POA: Insufficient documentation

## 2017-08-26 LAB — POCT HEMOGLOBIN-HEMACUE: Hemoglobin: 10.7 g/dL — ABNORMAL LOW (ref 12.0–15.0)

## 2017-08-26 MED ORDER — EPOETIN ALFA-EPBX 10000 UNIT/ML IJ SOLN
10000.0000 [IU] | INTRAMUSCULAR | Status: DC
  Administered 2017-08-26: 10000 [IU] via SUBCUTANEOUS
  Filled 2017-08-26: qty 1

## 2017-09-03 DIAGNOSIS — Z0279 Encounter for issue of other medical certificate: Secondary | ICD-10-CM | POA: Diagnosis not present

## 2017-09-09 ENCOUNTER — Other Ambulatory Visit: Payer: Self-pay

## 2017-09-09 ENCOUNTER — Ambulatory Visit (INDEPENDENT_AMBULATORY_CARE_PROVIDER_SITE_OTHER): Payer: Self-pay | Admitting: Physician Assistant

## 2017-09-09 ENCOUNTER — Ambulatory Visit (HOSPITAL_COMMUNITY)
Admission: RE | Admit: 2017-09-09 | Discharge: 2017-09-09 | Disposition: A | Discharge status: Home / Self Care | Payer: BLUE CROSS/BLUE SHIELD | Source: Ambulatory Visit | Attending: Nephrology | Admitting: Nephrology

## 2017-09-09 VITALS — BP 136/76 | HR 73 | Temp 98.0°F | Resp 20 | Ht 66.0 in | Wt 187.0 lb

## 2017-09-09 VITALS — BP 140/67 | HR 77 | Temp 97.7°F

## 2017-09-09 DIAGNOSIS — N186 End stage renal disease: Secondary | ICD-10-CM

## 2017-09-09 DIAGNOSIS — N185 Chronic kidney disease, stage 5: Secondary | ICD-10-CM | POA: Diagnosis not present

## 2017-09-09 DIAGNOSIS — I729 Aneurysm of unspecified site: Secondary | ICD-10-CM

## 2017-09-09 DIAGNOSIS — N184 Chronic kidney disease, stage 4 (severe): Secondary | ICD-10-CM

## 2017-09-09 DIAGNOSIS — Z992 Dependence on renal dialysis: Principal | ICD-10-CM

## 2017-09-09 LAB — RENAL FUNCTION PANEL
Albumin: 3.4 g/dL — ABNORMAL LOW (ref 3.5–5.0)
Anion gap: 12 (ref 5–15)
BUN: 63 mg/dL — ABNORMAL HIGH (ref 6–20)
CO2: 17 mmol/L — ABNORMAL LOW (ref 22–32)
Calcium: 8.8 mg/dL — ABNORMAL LOW (ref 8.9–10.3)
Chloride: 114 mmol/L — ABNORMAL HIGH (ref 98–111)
Creatinine, Ser: 6.1 mg/dL — ABNORMAL HIGH (ref 0.44–1.00)
GFR calc Af Amer: 8 mL/min — ABNORMAL LOW (ref >60)
GFR calc non Af Amer: 7 mL/min — ABNORMAL LOW (ref >60)
Glucose, Bld: 127 mg/dL — ABNORMAL HIGH (ref 70–99)
Phosphorus: 5.6 mg/dL — ABNORMAL HIGH (ref 2.5–4.6)
Potassium: 4.3 mmol/L (ref 3.5–5.1)
Sodium: 143 mmol/L (ref 135–145)

## 2017-09-09 LAB — FERRITIN: Ferritin: 143 ng/mL (ref 11–307)

## 2017-09-09 LAB — POCT HEMOGLOBIN-HEMACUE: Hemoglobin: 11.7 g/dL — ABNORMAL LOW (ref 12.0–15.0)

## 2017-09-09 LAB — IRON AND TIBC
Iron: 71 ug/dL (ref 28–170)
Saturation Ratios: 28 % (ref 10.4–31.8)
TIBC: 249 ug/dL — ABNORMAL LOW (ref 250–450)
UIBC: 178 ug/dL

## 2017-09-09 LAB — MAGNESIUM: Magnesium: 2.2 mg/dL (ref 1.7–2.4)

## 2017-09-09 MED ORDER — EPOETIN ALFA-EPBX 10000 UNIT/ML IJ SOLN
10000.0000 [IU] | INTRAMUSCULAR | Status: DC
  Administered 2017-09-09: 10000 [IU] via SUBCUTANEOUS
  Filled 2017-09-09: qty 1

## 2017-09-09 NOTE — Progress Notes (Addendum)
  POST OPERATIVE OFFICE NOTE    CC:  Left UE swelling after surgey  HPI:  This is a 57 y.o. female who is s/p Left UE loop graft.  She is not yet on HD.  She states that her left UE has shooting pains and moderate edema with pain to touch at the distal tunnel incision.  The pain keeps her from doing her work and interferes with her sleep.  She denise symptoms associated with steal.  No hand pain, motor loss or loss of sensation.  She denise fever and chills.  Allergies  Allergen Reactions  . Ace Inhibitors Cough    Current Outpatient Medications  Medication Sig Dispense Refill  . amLODipine (NORVASC) 10 MG tablet Take 10 mg by mouth daily.    . calcium acetate (PHOSLO) 667 MG capsule Take 1,334 mg by mouth 3 (three) times daily with meals.    . carvedilol (COREG) 6.25 MG tablet Take 6.25 mg by mouth 2 (two) times daily with a meal.    . furosemide (LASIX) 40 MG tablet Take 40 mg by mouth 2 (two) times daily.     . ondansetron (ZOFRAN ODT) 4 MG disintegrating tablet Take 1 tablet (4 mg total) by mouth every 8 (eight) hours as needed for nausea or vomiting. 20 tablet 0  . oxyCODONE (ROXICODONE) 5 MG immediate release tablet Take 1 tablet (5 mg total) by mouth every 6 (six) hours as needed. 12 tablet 0  . promethazine (PHENERGAN) 25 MG tablet Take 0.5 tablets (12.5 mg total) by mouth every 6 (six) hours as needed for nausea or vomiting. (Patient taking differently: Take 25 mg by mouth every 6 (six) hours as needed for nausea or vomiting. ) 30 tablet 1  . ranitidine (ZANTAC) 150 MG tablet Take 150 mg by mouth daily.    . traMADol (ULTRAM) 50 MG tablet Take 1 tablet (50 mg total) by mouth daily as needed. 20 tablet 0   No current facility-administered medications for this visit.    Facility-Administered Medications Ordered in Other Visits  Medication Dose Route Frequency Provider Last Rate Last Dose  . epoetin alfa-epbx (RETACRIT) injection 10,000 Units  10,000 Units Subcutaneous Q14 Days  Elmarie Shiley, MD   10,000 Units at 09/09/17 1458     ROS:  See HPI  Physical Exam:  Vitals:   09/09/17 1558  BP: 136/76  Pulse: 73  Resp: 20  Temp: 98 F (36.7 C)  SpO2: 100%    Incision:  Well healed axillary incision, moderate firm edema 4 cm circumference surround distal loop incision.  The skin is not compromised and there are no open wounds. Extremities:  Palpable radial pulse, sensation intact, grip 5/5   Assessment/Plan:  This is a 57 y.o. female who is s/p:left UE loop graft   Post op hematoma distal loop graft incision.  I will schedule her for artrial duplex to r/o pseudoaneurysm.  She was instructed to use warm compresses alternating with ice PRN and tylenol for discomfort.  She will be called with further instruction after the duplex.    She has a f/u appt next week she will keep.  Roxy Horseman , PA-C [facility] [phone]  Left UE graft duplex 09/11/2017 shows a small fluid collection beside the distal loop graft.  She has edema in the area as well as forearm and hand.  She denise fever, chills, but she has occasional shooting pains.

## 2017-09-09 NOTE — Progress Notes (Signed)
Pt had a graft placed to left upper arm.  Pt states she is having sharp pains through her arm, it is visibly swollen, pink and warm.  Called and spoke with Deanna at Dr Ena Dawley office who stated to tell the pt to go to Dr Karlyn Agee office today and show them her arm.  Pt verbalized understanding.

## 2017-09-11 ENCOUNTER — Encounter: Payer: Self-pay | Admitting: Vascular Surgery

## 2017-09-11 ENCOUNTER — Ambulatory Visit (HOSPITAL_COMMUNITY)
Admission: RE | Admit: 2017-09-11 | Discharge: 2017-09-11 | Disposition: A | Discharge status: Home / Self Care | Payer: BLUE CROSS/BLUE SHIELD | Source: Ambulatory Visit | Attending: Vascular Surgery | Admitting: Vascular Surgery

## 2017-09-11 DIAGNOSIS — I120 Hypertensive chronic kidney disease with stage 5 chronic kidney disease or end stage renal disease: Secondary | ICD-10-CM | POA: Insufficient documentation

## 2017-09-11 DIAGNOSIS — Z87891 Personal history of nicotine dependence: Secondary | ICD-10-CM | POA: Diagnosis not present

## 2017-09-11 DIAGNOSIS — N186 End stage renal disease: Secondary | ICD-10-CM

## 2017-09-11 DIAGNOSIS — I729 Aneurysm of unspecified site: Secondary | ICD-10-CM | POA: Diagnosis not present

## 2017-09-11 DIAGNOSIS — E785 Hyperlipidemia, unspecified: Secondary | ICD-10-CM | POA: Insufficient documentation

## 2017-09-11 DIAGNOSIS — M79602 Pain in left arm: Secondary | ICD-10-CM | POA: Insufficient documentation

## 2017-09-11 DIAGNOSIS — Z992 Dependence on renal dialysis: Secondary | ICD-10-CM | POA: Diagnosis not present

## 2017-09-18 ENCOUNTER — Ambulatory Visit (INDEPENDENT_AMBULATORY_CARE_PROVIDER_SITE_OTHER): Payer: Self-pay | Admitting: Physician Assistant

## 2017-09-18 ENCOUNTER — Other Ambulatory Visit: Payer: Self-pay

## 2017-09-18 VITALS — BP 122/71 | HR 69 | Temp 98.5°F | Resp 16 | Ht 66.0 in | Wt 186.0 lb

## 2017-09-18 DIAGNOSIS — N185 Chronic kidney disease, stage 5: Secondary | ICD-10-CM

## 2017-09-18 NOTE — Progress Notes (Signed)
POST OPERATIVE OFFICE NOTE    CC:  F/u for surgery  HPI:  This is a 57 y.o. female who is s/p left arm axillary loop graft with 4-7 mm stretch PTFE by Dr. Donzetta Matters on 08/20/17.  She was seen on 09/09/17 and at that time she had complaints of left upper arm shooting pains and moderate edema with pain to touch at the distal tunnel incision.  This pain kept her from doing her work and interferes with sleep.  She did not have any steal sx at the time.    She presents today and continues to have swelling in the left upper arm.  She states that it gets worse and has some swelling in the distal arm if she uses her arm a lot.  She denies any redness around the graft and denies any fevers.  She still has some sharp shooting pains, but overall, her pain is somewhat improved.  She also states she has some numbness on the back of her lower left arm.    She is not yet on dialysis.    Allergies  Allergen Reactions  . Ace Inhibitors Cough    Current Outpatient Medications  Medication Sig Dispense Refill  . amLODipine (NORVASC) 10 MG tablet Take 10 mg by mouth daily.    . calcium acetate (PHOSLO) 667 MG capsule Take 1,334 mg by mouth 3 (three) times daily with meals.    . carvedilol (COREG) 6.25 MG tablet Take 6.25 mg by mouth 2 (two) times daily with a meal.    . furosemide (LASIX) 40 MG tablet Take 40 mg by mouth 2 (two) times daily.     . ondansetron (ZOFRAN ODT) 4 MG disintegrating tablet Take 1 tablet (4 mg total) by mouth every 8 (eight) hours as needed for nausea or vomiting. 20 tablet 0  . oxyCODONE (ROXICODONE) 5 MG immediate release tablet Take 1 tablet (5 mg total) by mouth every 6 (six) hours as needed. 12 tablet 0  . promethazine (PHENERGAN) 25 MG tablet Take 0.5 tablets (12.5 mg total) by mouth every 6 (six) hours as needed for nausea or vomiting. (Patient taking differently: Take 25 mg by mouth every 6 (six) hours as needed for nausea or vomiting. ) 30 tablet 1  . ranitidine (ZANTAC) 150 MG  tablet Take 150 mg by mouth daily.    . traMADol (ULTRAM) 50 MG tablet Take 1 tablet (50 mg total) by mouth daily as needed. 20 tablet 0   No current facility-administered medications for this visit.      ROS:  See HPI  Physical Exam:  Vitals:   09/18/17 1246  BP: 122/71  Pulse: 69  Resp: 16  Temp: 98.5 F (36.9 C)  SpO2: 99%   Vitals:   09/18/17 1246  Weight: 186 lb (84.4 kg)  Height: 5\' 6"  (1.676 m)   Body mass index is 30.02 kg/m.   Incision:  Healed nicely Extremities:  Upper extremity swelling mid graft; no evidence of erythema or warmth.  Excellent thrill in graft and easily palpable.  Easily palpable left radial pulse.     Duplex LUE AV graft 09/11/17: Final Interpretation:  Left: Patent left upper arm AVG, perigraft fluid noted in the mid   upper arm. No evidence of pseudoaneurysm. No evidence of   hemodynamically significant stenosis in the left upper   extremity.   Assessment/Plan:  This is a 57 y.o. female who is s/p:  left arm axillary loop graft with 4-7 mm stretch PTFE by  Dr. Donzetta Matters on 08/20/17.  -still with some swelling in the LUA mid graft.  She states that if she uses her arm a lot, her distal arm will also swell.  On duplex last week, there was some perigraft fluid noted.  There is no evidence of infection in the left upper arm.  Still has some sharp pains occasionally in the upper arm but is somewhat better.  -not uncommon to have some swelling around the tunnel/graft after surgery.  Will have her make an appointment for 2 weeks with PA and she will be on light duty for 2 weeks at work.  If the swelling is improved, she will cancel the appointment.   -she knows to call sooner should she develop redness around the graft or has fevers.  -numbness in posterior portion of the lower arm most likely due to nerve irritation at surgery-should improve over time.      Leontine Locket, PA-C [facility] [phone]  Clinic MD:   Donzetta Matters

## 2017-09-23 ENCOUNTER — Ambulatory Visit (HOSPITAL_COMMUNITY)
Admission: RE | Admit: 2017-09-23 | Discharge: 2017-09-23 | Disposition: A | Discharge status: Home / Self Care | Payer: BLUE CROSS/BLUE SHIELD | Source: Ambulatory Visit | Attending: Nephrology | Admitting: Nephrology

## 2017-09-23 VITALS — BP 133/70 | HR 71 | Temp 97.5°F | Resp 20

## 2017-09-23 DIAGNOSIS — N185 Chronic kidney disease, stage 5: Secondary | ICD-10-CM | POA: Insufficient documentation

## 2017-09-23 DIAGNOSIS — D631 Anemia in chronic kidney disease: Secondary | ICD-10-CM | POA: Diagnosis not present

## 2017-09-23 LAB — POCT HEMOGLOBIN-HEMACUE: Hemoglobin: 11.8 g/dL — ABNORMAL LOW (ref 12.0–15.0)

## 2017-09-23 MED ORDER — EPOETIN ALFA-EPBX 10000 UNIT/ML IJ SOLN
10000.0000 [IU] | INTRAMUSCULAR | Status: DC
  Administered 2017-09-23: 10000 [IU] via SUBCUTANEOUS
  Filled 2017-09-23: qty 1

## 2017-09-28 ENCOUNTER — Ambulatory Visit (INDEPENDENT_AMBULATORY_CARE_PROVIDER_SITE_OTHER): Payer: Self-pay | Admitting: Physician Assistant

## 2017-09-28 ENCOUNTER — Encounter: Payer: Self-pay | Admitting: Vascular Surgery

## 2017-09-28 ENCOUNTER — Other Ambulatory Visit: Payer: Self-pay

## 2017-09-28 VITALS — BP 122/69 | HR 71 | Temp 98.6°F | Resp 16 | Ht 66.0 in | Wt 188.0 lb

## 2017-09-28 DIAGNOSIS — N185 Chronic kidney disease, stage 5: Secondary | ICD-10-CM

## 2017-09-28 NOTE — Progress Notes (Signed)
POST OPERATIVE OFFICE NOTE    CC:  F/u for surgery  HPI:  This is a 57 y.o. female who is s/p left arm axillary loop graft with 4-7 mm stretch PTFE by Dr. Donzetta Matters on 08/20/17.  She was seen on 09/09/17 and at that time she had complaints of left upper arm shooting pains and moderate edema with pain to touch at the distal tunnel incision.  This pain kept her from doing her work and interferes with sleep.  She did not have any steal sx at the time.    She returned to clinic on 09/18/17 and continued to have swelling in the left upper arm.  She said that it would get worse and she would have some swelling in the distal arm if she used it a lot.  At that time, she denied any redness around the graft and did not have any fevers.  She also had some numbness on the back of her lower left arm.  She returns today for follow up.  She states that her pain has improved significantly but she still have some swelling on the upper arm.  She is still on light duty at work.    She denies any redness around the graft and denies fevers.    She is not yet on dialysis and is being worked up for a transplant.    Allergies  Allergen Reactions  . Ace Inhibitors Cough    lisinopril    Current Outpatient Medications  Medication Sig Dispense Refill  . amLODipine (NORVASC) 10 MG tablet Take 10 mg by mouth daily.    . calcium acetate (PHOSLO) 667 MG capsule Take 1,334 mg by mouth 3 (three) times daily with meals.    . carvedilol (COREG) 6.25 MG tablet Take 6.25 mg by mouth 2 (two) times daily with a meal.    . furosemide (LASIX) 40 MG tablet Take 40 mg by mouth 2 (two) times daily.     . ondansetron (ZOFRAN ODT) 4 MG disintegrating tablet Take 1 tablet (4 mg total) by mouth every 8 (eight) hours as needed for nausea or vomiting. 20 tablet 0  . oxyCODONE (ROXICODONE) 5 MG immediate release tablet Take 1 tablet (5 mg total) by mouth every 6 (six) hours as needed. (Patient not taking: Reported on 09/18/2017) 12 tablet 0  .  promethazine (PHENERGAN) 25 MG tablet Take 0.5 tablets (12.5 mg total) by mouth every 6 (six) hours as needed for nausea or vomiting. (Patient taking differently: Take 25 mg by mouth every 6 (six) hours as needed for nausea or vomiting. ) 30 tablet 1  . ranitidine (ZANTAC) 150 MG tablet Take 150 mg by mouth daily.    . traMADol (ULTRAM) 50 MG tablet Take 1 tablet (50 mg total) by mouth daily as needed. 20 tablet 0   No current facility-administered medications for this visit.      ROS:  See HPI  Physical Exam: Vitals:   09/28/17 1403  BP: 122/69  Pulse: 71  Resp: 16  Temp: 98.6 F (37 C)  SpO2: 99%   Vitals:   09/28/17 1403  Weight: 188 lb (85.3 kg)  Height: 5\' 6"  (1.676 m)   Body mass index is 30.34 kg/m.  Incision:  Healed nicely Extremities:  +palpable left radial pulse; easily palpable graft with excellent thrill;  She continues to have swelling mid graft.  There is no erythema around the graft on the upper arm.    Assessment/Plan:  This is a 57 y.o.  female who is s/p:  left arm axillary loop graft with 4-7 mm stretch PTFE by Dr. Donzetta Matters on 08/20/17.   -pt's pain is significantly better this visit.  She has an excellent thrill within the graft.  The swelling in her left upper arm is unchanged.  There is no erythema around the graft.  She has not had any fevers.  On her u/s last visit, it did reveal perigraft fluid.  Discussed with Dr. Trula Slade and given she hasn't had fevers or erythema around the graft, will continue to monitor.  Would not aspirate fluid at this point.  She will follow up as needed.  She knows to call us if she develops fevers or redness around the graft.  She may return to full duty at work.     Leontine Locket, PA-C [facility] [phone]  Clinic MD:  Trula Slade

## 2017-10-02 ENCOUNTER — Telehealth: Payer: Self-pay | Admitting: *Deleted

## 2017-10-02 NOTE — Telephone Encounter (Signed)
Called patient to schedule and plan for upcoming lab appointment per patient's request and orders given by [facility]. Need to inquire about form that was not included in the packet that was left for our review. Form must accompany blood work per instructions.  Form titled "Kidney/Pancreas Transplant Candidate Monthly Serum Screen." I left a voicemail on the mobile phone for [place]o to return my call.  Maryan Rued, PBT 10-02-17 [phone]

## 2017-10-07 ENCOUNTER — Other Ambulatory Visit (INDEPENDENT_AMBULATORY_CARE_PROVIDER_SITE_OTHER): Payer: BLUE CROSS/BLUE SHIELD

## 2017-10-07 ENCOUNTER — Ambulatory Visit (HOSPITAL_COMMUNITY)
Admission: RE | Admit: 2017-10-07 | Discharge: 2017-10-07 | Disposition: A | Discharge status: Home / Self Care | Payer: BLUE CROSS/BLUE SHIELD | Source: Ambulatory Visit | Attending: Nephrology | Admitting: Nephrology

## 2017-10-07 VITALS — BP 148/76 | HR 72 | Temp 98.2°F | Resp 20

## 2017-10-07 DIAGNOSIS — N185 Chronic kidney disease, stage 5: Secondary | ICD-10-CM | POA: Insufficient documentation

## 2017-10-07 DIAGNOSIS — Z79899 Other long term (current) drug therapy: Secondary | ICD-10-CM | POA: Diagnosis not present

## 2017-10-07 DIAGNOSIS — Z5181 Encounter for therapeutic drug level monitoring: Secondary | ICD-10-CM | POA: Insufficient documentation

## 2017-10-07 DIAGNOSIS — D631 Anemia in chronic kidney disease: Secondary | ICD-10-CM | POA: Diagnosis not present

## 2017-10-07 DIAGNOSIS — I10 Essential (primary) hypertension: Secondary | ICD-10-CM

## 2017-10-07 LAB — FERRITIN: Ferritin: 116 ng/mL (ref 11–307)

## 2017-10-07 LAB — RENAL FUNCTION PANEL
Albumin: 3.6 g/dL (ref 3.5–5.0)
Anion gap: 11 (ref 5–15)
BUN: 66 mg/dL — ABNORMAL HIGH (ref 6–20)
CO2: 15 mmol/L — ABNORMAL LOW (ref 22–32)
Calcium: 8.5 mg/dL — ABNORMAL LOW (ref 8.9–10.3)
Chloride: 113 mmol/L — ABNORMAL HIGH (ref 98–111)
Creatinine, Ser: 5.7 mg/dL — ABNORMAL HIGH (ref 0.44–1.00)
GFR calc Af Amer: 9 mL/min — ABNORMAL LOW (ref >60)
GFR calc non Af Amer: 8 mL/min — ABNORMAL LOW (ref >60)
Glucose, Bld: 111 mg/dL — ABNORMAL HIGH (ref 70–99)
Phosphorus: 6.1 mg/dL — ABNORMAL HIGH (ref 2.5–4.6)
Potassium: 4.7 mmol/L (ref 3.5–5.1)
Sodium: 139 mmol/L (ref 135–145)

## 2017-10-07 LAB — IRON AND TIBC
Iron: 48 ug/dL (ref 28–170)
Saturation Ratios: 20 % (ref 10.4–31.8)
TIBC: 235 ug/dL — ABNORMAL LOW (ref 250–450)
UIBC: 187 ug/dL

## 2017-10-07 LAB — MAGNESIUM: Magnesium: 2.1 mg/dL (ref 1.7–2.4)

## 2017-10-07 LAB — POCT HEMOGLOBIN-HEMACUE: Hemoglobin: 12.1 g/dL (ref 12.0–15.0)

## 2017-10-07 MED ORDER — EPOETIN ALFA-EPBX 10000 UNIT/ML IJ SOLN
10000.0000 [IU] | INTRAMUSCULAR | Status: DC
  Filled 2017-10-07: qty 1

## 2017-10-14 DIAGNOSIS — N2581 Secondary hyperparathyroidism of renal origin: Secondary | ICD-10-CM | POA: Diagnosis not present

## 2017-10-14 DIAGNOSIS — D631 Anemia in chronic kidney disease: Secondary | ICD-10-CM | POA: Diagnosis not present

## 2017-10-14 DIAGNOSIS — N185 Chronic kidney disease, stage 5: Secondary | ICD-10-CM | POA: Diagnosis not present

## 2017-10-14 DIAGNOSIS — Z23 Encounter for immunization: Secondary | ICD-10-CM | POA: Diagnosis not present

## 2017-10-14 DIAGNOSIS — I120 Hypertensive chronic kidney disease with stage 5 chronic kidney disease or end stage renal disease: Secondary | ICD-10-CM | POA: Diagnosis not present

## 2017-10-21 ENCOUNTER — Ambulatory Visit (HOSPITAL_COMMUNITY)
Admission: RE | Admit: 2017-10-21 | Discharge: 2017-10-21 | Disposition: A | Discharge status: Home / Self Care | Payer: BLUE CROSS/BLUE SHIELD | Source: Ambulatory Visit | Attending: Nephrology | Admitting: Nephrology

## 2017-10-21 VITALS — BP 122/71 | HR 74 | Temp 98.5°F | Resp 20

## 2017-10-21 DIAGNOSIS — N185 Chronic kidney disease, stage 5: Secondary | ICD-10-CM | POA: Insufficient documentation

## 2017-10-21 DIAGNOSIS — D631 Anemia in chronic kidney disease: Secondary | ICD-10-CM | POA: Insufficient documentation

## 2017-10-21 LAB — POCT HEMOGLOBIN-HEMACUE: Hemoglobin: 11.3 g/dL — ABNORMAL LOW (ref 12.0–15.0)

## 2017-10-21 MED ORDER — EPOETIN ALFA-EPBX 10000 UNIT/ML IJ SOLN
10000.0000 [IU] | INTRAMUSCULAR | Status: DC
  Administered 2017-10-21: 10000 [IU] via SUBCUTANEOUS
  Filled 2017-10-21: qty 1

## 2017-10-27 ENCOUNTER — Other Ambulatory Visit: Payer: Self-pay | Admitting: Internal Medicine

## 2017-11-03 ENCOUNTER — Encounter: Payer: Self-pay | Admitting: Gastroenterology

## 2017-11-04 ENCOUNTER — Encounter (HOSPITAL_COMMUNITY)
Admission: RE | Admit: 2017-11-04 | Discharge: 2017-11-04 | Disposition: A | Discharge status: Home / Self Care | Payer: BLUE CROSS/BLUE SHIELD | Source: Ambulatory Visit | Attending: Nephrology | Admitting: Nephrology

## 2017-11-04 ENCOUNTER — Other Ambulatory Visit (INDEPENDENT_AMBULATORY_CARE_PROVIDER_SITE_OTHER): Payer: BLUE CROSS/BLUE SHIELD

## 2017-11-04 VITALS — BP 120/72 | HR 83 | Resp 20

## 2017-11-04 DIAGNOSIS — Z5181 Encounter for therapeutic drug level monitoring: Secondary | ICD-10-CM | POA: Diagnosis not present

## 2017-11-04 DIAGNOSIS — Z79899 Other long term (current) drug therapy: Secondary | ICD-10-CM | POA: Diagnosis not present

## 2017-11-04 DIAGNOSIS — N185 Chronic kidney disease, stage 5: Secondary | ICD-10-CM | POA: Diagnosis not present

## 2017-11-04 DIAGNOSIS — D631 Anemia in chronic kidney disease: Secondary | ICD-10-CM | POA: Insufficient documentation

## 2017-11-04 LAB — RENAL FUNCTION PANEL
Albumin: 3.5 g/dL (ref 3.5–5.0)
Anion gap: 12 (ref 5–15)
BUN: 53 mg/dL — ABNORMAL HIGH (ref 6–20)
CO2: 17 mmol/L — ABNORMAL LOW (ref 22–32)
Calcium: 8.6 mg/dL — ABNORMAL LOW (ref 8.9–10.3)
Chloride: 111 mmol/L (ref 98–111)
Creatinine, Ser: 5.86 mg/dL — ABNORMAL HIGH (ref 0.44–1.00)
GFR calc Af Amer: 9 mL/min — ABNORMAL LOW (ref >60)
GFR calc non Af Amer: 7 mL/min — ABNORMAL LOW (ref >60)
Glucose, Bld: 155 mg/dL — ABNORMAL HIGH (ref 70–99)
Phosphorus: 4.7 mg/dL — ABNORMAL HIGH (ref 2.5–4.6)
Potassium: 4.3 mmol/L (ref 3.5–5.1)
Sodium: 140 mmol/L (ref 135–145)

## 2017-11-04 LAB — POCT HEMOGLOBIN-HEMACUE: Hemoglobin: 10.9 g/dL — ABNORMAL LOW (ref 12.0–15.0)

## 2017-11-04 LAB — IRON AND TIBC
Iron: 45 ug/dL (ref 28–170)
Saturation Ratios: 19 % (ref 10.4–31.8)
TIBC: 231 ug/dL — ABNORMAL LOW (ref 250–450)
UIBC: 186 ug/dL

## 2017-11-04 LAB — FERRITIN: Ferritin: 152 ng/mL (ref 11–307)

## 2017-11-04 LAB — MAGNESIUM: Magnesium: 2.1 mg/dL (ref 1.7–2.4)

## 2017-11-04 MED ORDER — EPOETIN ALFA-EPBX 10000 UNIT/ML IJ SOLN
10000.0000 [IU] | INTRAMUSCULAR | Status: DC
  Administered 2017-11-04: 10000 [IU] via SUBCUTANEOUS
  Filled 2017-11-04: qty 1

## 2017-11-18 ENCOUNTER — Ambulatory Visit (HOSPITAL_COMMUNITY)
Admission: RE | Admit: 2017-11-18 | Discharge: 2017-11-18 | Disposition: A | Discharge status: Home / Self Care | Payer: BLUE CROSS/BLUE SHIELD | Source: Ambulatory Visit | Attending: Nephrology | Admitting: Nephrology

## 2017-11-18 VITALS — BP 133/69 | HR 77 | Temp 97.9°F | Resp 20

## 2017-11-18 DIAGNOSIS — N185 Chronic kidney disease, stage 5: Secondary | ICD-10-CM | POA: Insufficient documentation

## 2017-11-18 DIAGNOSIS — D631 Anemia in chronic kidney disease: Secondary | ICD-10-CM | POA: Insufficient documentation

## 2017-11-18 LAB — POCT HEMOGLOBIN-HEMACUE: Hemoglobin: 11.5 g/dL — ABNORMAL LOW (ref 12.0–15.0)

## 2017-11-18 MED ORDER — EPOETIN ALFA-EPBX 10000 UNIT/ML IJ SOLN
10000.0000 [IU] | INTRAMUSCULAR | Status: DC
  Administered 2017-11-18: 10000 [IU] via SUBCUTANEOUS
  Filled 2017-11-18: qty 1

## 2017-11-25 ENCOUNTER — Other Ambulatory Visit: Payer: Self-pay

## 2017-11-25 ENCOUNTER — Encounter: Payer: Self-pay | Admitting: Internal Medicine

## 2017-11-25 ENCOUNTER — Ambulatory Visit (INDEPENDENT_AMBULATORY_CARE_PROVIDER_SITE_OTHER): Payer: BLUE CROSS/BLUE SHIELD | Admitting: Internal Medicine

## 2017-11-25 DIAGNOSIS — N185 Chronic kidney disease, stage 5: Secondary | ICD-10-CM | POA: Diagnosis not present

## 2017-11-25 DIAGNOSIS — I120 Hypertensive chronic kidney disease with stage 5 chronic kidney disease or end stage renal disease: Secondary | ICD-10-CM | POA: Diagnosis not present

## 2017-11-25 DIAGNOSIS — I10 Essential (primary) hypertension: Secondary | ICD-10-CM

## 2017-11-25 DIAGNOSIS — R110 Nausea: Secondary | ICD-10-CM

## 2017-11-25 DIAGNOSIS — R011 Cardiac murmur, unspecified: Secondary | ICD-10-CM

## 2017-11-25 DIAGNOSIS — Q613 Polycystic kidney, unspecified: Secondary | ICD-10-CM | POA: Diagnosis not present

## 2017-11-25 DIAGNOSIS — G8929 Other chronic pain: Secondary | ICD-10-CM | POA: Diagnosis not present

## 2017-11-25 DIAGNOSIS — Z79899 Other long term (current) drug therapy: Secondary | ICD-10-CM

## 2017-11-25 DIAGNOSIS — Z791 Long term (current) use of non-steroidal anti-inflammatories (NSAID): Secondary | ICD-10-CM

## 2017-11-25 DIAGNOSIS — Z79891 Long term (current) use of opiate analgesic: Secondary | ICD-10-CM

## 2017-11-25 MED ORDER — PROMETHAZINE HCL 25 MG PO TABS: 12.5000 mg | ORAL_TABLET | Freq: Four times a day (QID) | ORAL | 1 refills | Status: DC | PRN

## 2017-11-25 MED ORDER — TRAMADOL HCL 50 MG PO TABS: 50.0000 mg | ORAL_TABLET | Freq: Every day | ORAL | 0 refills | Status: DC | PRN
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
## 2017-11-25 NOTE — Patient Instructions (Signed)
Thank you for coming to the clinic today. It was a pleasure to see you.   Your blood pressure was elevated today, please continue to take you blood pressure medications and also be aware of the amount of salt in your diet.   FOLLOW-UP INSTRUCTIONS When: 3-4 months with Dr. Jari Favre  For: Follow up of your blood pressure  What to bring: all of your medication bottles   Please call the internal medicine center clinic if you have any questions or concerns, we may be able to help and keep you from a long and expensive emergency room wait. Our clinic and after hours phone number is [phone], the best time to call is Monday through Friday 9 am to 4 pm but there is always someone available 24/7 if you have an emergency. If you need medication refills please notify your pharmacy one week in advance and they will send [place] a request.

## 2017-11-26 ENCOUNTER — Encounter: Payer: Self-pay | Admitting: Internal Medicine

## 2017-11-26 NOTE — Assessment & Plan Note (Signed)
Kristen Moreno is on chronic opioid therapy for chronic pain. The date of the controlled substances contract is referenced in the [place] and / or the overview. Date of pain contract was 08/08/2017. As part of the treatment plan, the [place] controlled substance database is checked at least twice yearly and the database results are appropriate, there was a short course of oxycodone 5 mg given post op. I have last reviewed the results on 11/25/2017.   The last UDS was on 08/05/2017 and results are as expected. Patient needs at least a yearly UDS.   The patient is on tramadol (Ultram) strength 50 mg, 20 per 120 days. This regimen allows Kristen Moreno to function and does not cause excessive sedation or other side effects.  "The benefits of continuing opioid therapy outweigh the risks and chronic opioids will be continued. Ongoing education about safe opioid treatment is provided  Interventions today include: -Refill - 1 paper Rx electronically sent  -refilled  phenergan 12.5 mg daily for nausea Last EKG reviewed from four months ago did not reveal QT prolongation  -Reiterated pain contract stipulation on receiving controlled substances from outside the internal medicine team

## 2017-11-26 NOTE — Assessment & Plan Note (Signed)
Blood pressure today is outside of the usually well controlled range. I think this is an outlier, I have asked Ms. Beckworth to take some blood pressure readings at home and bring these to her follow up visit.  - amlodipine 10 mg daily, carvedilol 6.35 mg twice daily, lasix 40 mg daily

## 2017-11-27 NOTE — Progress Notes (Signed)
Internal Medicine Clinic Attending  Case discussed with Dr. Blum at the time of the visit.  We reviewed the resident's history and exam and pertinent patient test results.  I agree with the assessment, diagnosis, and plan of care documented in the resident's note. 

## 2017-12-02 ENCOUNTER — Encounter (HOSPITAL_COMMUNITY)
Admission: RE | Admit: 2017-12-02 | Discharge: 2017-12-02 | Disposition: A | Discharge status: Home / Self Care | Payer: BLUE CROSS/BLUE SHIELD | Source: Ambulatory Visit | Attending: Nephrology | Admitting: Nephrology

## 2017-12-02 VITALS — BP 134/69 | HR 77 | Temp 98.0°F | Resp 20

## 2017-12-02 DIAGNOSIS — Z5181 Encounter for therapeutic drug level monitoring: Secondary | ICD-10-CM | POA: Diagnosis not present

## 2017-12-02 DIAGNOSIS — D631 Anemia in chronic kidney disease: Secondary | ICD-10-CM | POA: Insufficient documentation

## 2017-12-02 DIAGNOSIS — N185 Chronic kidney disease, stage 5: Secondary | ICD-10-CM | POA: Insufficient documentation

## 2017-12-02 DIAGNOSIS — Z79899 Other long term (current) drug therapy: Secondary | ICD-10-CM | POA: Insufficient documentation

## 2017-12-02 LAB — IRON AND TIBC
Iron: 45 ug/dL (ref 28–170)
Saturation Ratios: 17 % (ref 10.4–31.8)
TIBC: 262 ug/dL (ref 250–450)
UIBC: 217 ug/dL

## 2017-12-02 LAB — RENAL FUNCTION PANEL
Albumin: 3.6 g/dL (ref 3.5–5.0)
Anion gap: 9 (ref 5–15)
BUN: 66 mg/dL — ABNORMAL HIGH (ref 6–20)
CO2: 19 mmol/L — ABNORMAL LOW (ref 22–32)
Calcium: 8.6 mg/dL — ABNORMAL LOW (ref 8.9–10.3)
Chloride: 110 mmol/L (ref 98–111)
Creatinine, Ser: 6.53 mg/dL — ABNORMAL HIGH (ref 0.44–1.00)
GFR calc Af Amer: 7 mL/min — ABNORMAL LOW (ref >60)
GFR calc non Af Amer: 6 mL/min — ABNORMAL LOW (ref >60)
Glucose, Bld: 124 mg/dL — ABNORMAL HIGH (ref 70–99)
Phosphorus: 6.2 mg/dL — ABNORMAL HIGH (ref 2.5–4.6)
Potassium: 4.8 mmol/L (ref 3.5–5.1)
Sodium: 138 mmol/L (ref 135–145)

## 2017-12-02 LAB — MAGNESIUM: Magnesium: 2.2 mg/dL (ref 1.7–2.4)

## 2017-12-02 LAB — POCT HEMOGLOBIN-HEMACUE: Hemoglobin: 11.1 g/dL — ABNORMAL LOW (ref 12.0–15.0)

## 2017-12-02 LAB — FERRITIN: Ferritin: 152 ng/mL (ref 11–307)

## 2017-12-02 MED ORDER — EPOETIN ALFA-EPBX 10000 UNIT/ML IJ SOLN
10000.0000 [IU] | INTRAMUSCULAR | Status: DC
  Administered 2017-12-02: 10000 [IU] via SUBCUTANEOUS
  Filled 2017-12-02: qty 1

## 2017-12-14 DIAGNOSIS — D631 Anemia in chronic kidney disease: Secondary | ICD-10-CM | POA: Diagnosis not present

## 2017-12-14 DIAGNOSIS — N2581 Secondary hyperparathyroidism of renal origin: Secondary | ICD-10-CM | POA: Diagnosis not present

## 2017-12-14 DIAGNOSIS — I129 Hypertensive chronic kidney disease with stage 1 through stage 4 chronic kidney disease, or unspecified chronic kidney disease: Secondary | ICD-10-CM | POA: Diagnosis not present

## 2017-12-14 DIAGNOSIS — N185 Chronic kidney disease, stage 5: Secondary | ICD-10-CM | POA: Diagnosis not present

## 2017-12-16 ENCOUNTER — Encounter (HOSPITAL_COMMUNITY)
Admission: RE | Admit: 2017-12-16 | Discharge: 2017-12-16 | Disposition: A | Discharge status: Home / Self Care | Payer: BLUE CROSS/BLUE SHIELD | Source: Ambulatory Visit | Attending: Nephrology | Admitting: Nephrology

## 2017-12-16 VITALS — BP 132/68 | HR 95 | Temp 98.1°F | Resp 20

## 2017-12-16 DIAGNOSIS — Z5181 Encounter for therapeutic drug level monitoring: Secondary | ICD-10-CM | POA: Diagnosis not present

## 2017-12-16 DIAGNOSIS — N185 Chronic kidney disease, stage 5: Secondary | ICD-10-CM | POA: Diagnosis not present

## 2017-12-16 DIAGNOSIS — D631 Anemia in chronic kidney disease: Secondary | ICD-10-CM | POA: Diagnosis not present

## 2017-12-16 DIAGNOSIS — Z79899 Other long term (current) drug therapy: Secondary | ICD-10-CM | POA: Diagnosis not present

## 2017-12-16 LAB — POCT HEMOGLOBIN-HEMACUE: Hemoglobin: 10.5 g/dL — ABNORMAL LOW (ref 12.0–15.0)

## 2017-12-16 MED ORDER — EPOETIN ALFA-EPBX 10000 UNIT/ML IJ SOLN
10000.0000 [IU] | INTRAMUSCULAR | Status: DC
  Administered 2017-12-16: 10000 [IU] via SUBCUTANEOUS
  Filled 2017-12-16: qty 1

## 2017-12-22 DIAGNOSIS — D631 Anemia in chronic kidney disease: Secondary | ICD-10-CM | POA: Insufficient documentation

## 2017-12-22 DIAGNOSIS — R509 Fever, unspecified: Secondary | ICD-10-CM | POA: Insufficient documentation

## 2017-12-22 DIAGNOSIS — N186 End stage renal disease: Secondary | ICD-10-CM | POA: Insufficient documentation

## 2017-12-22 DIAGNOSIS — Z111 Encounter for screening for respiratory tuberculosis: Secondary | ICD-10-CM | POA: Insufficient documentation

## 2017-12-22 DIAGNOSIS — R52 Pain, unspecified: Secondary | ICD-10-CM | POA: Insufficient documentation

## 2017-12-22 DIAGNOSIS — K219 Gastro-esophageal reflux disease without esophagitis: Secondary | ICD-10-CM | POA: Insufficient documentation

## 2017-12-22 DIAGNOSIS — D689 Coagulation defect, unspecified: Secondary | ICD-10-CM | POA: Insufficient documentation

## 2017-12-22 DIAGNOSIS — N2581 Secondary hyperparathyroidism of renal origin: Secondary | ICD-10-CM | POA: Insufficient documentation

## 2017-12-23 DIAGNOSIS — N186 End stage renal disease: Secondary | ICD-10-CM | POA: Diagnosis not present

## 2017-12-23 DIAGNOSIS — N2581 Secondary hyperparathyroidism of renal origin: Secondary | ICD-10-CM | POA: Diagnosis not present

## 2017-12-25 DIAGNOSIS — N186 End stage renal disease: Secondary | ICD-10-CM | POA: Diagnosis not present

## 2017-12-25 DIAGNOSIS — N390 Urinary tract infection, site not specified: Secondary | ICD-10-CM | POA: Insufficient documentation

## 2017-12-25 DIAGNOSIS — I739 Peripheral vascular disease, unspecified: Secondary | ICD-10-CM | POA: Insufficient documentation

## 2017-12-25 DIAGNOSIS — N2581 Secondary hyperparathyroidism of renal origin: Secondary | ICD-10-CM | POA: Diagnosis not present

## 2017-12-28 ENCOUNTER — Other Ambulatory Visit: Payer: Self-pay | Admitting: Nephrology

## 2017-12-28 ENCOUNTER — Ambulatory Visit
Admission: RE | Admit: 2017-12-28 | Discharge: 2017-12-28 | Disposition: A | Discharge status: Home / Self Care | Payer: BLUE CROSS/BLUE SHIELD | Source: Ambulatory Visit | Attending: Nephrology | Admitting: Nephrology

## 2017-12-28 ENCOUNTER — Other Ambulatory Visit: Payer: Self-pay | Admitting: Physician Assistant

## 2017-12-28 DIAGNOSIS — R238 Other skin changes: Secondary | ICD-10-CM

## 2017-12-28 DIAGNOSIS — Z23 Encounter for immunization: Secondary | ICD-10-CM | POA: Diagnosis not present

## 2017-12-28 DIAGNOSIS — J90 Pleural effusion, not elsewhere classified: Secondary | ICD-10-CM | POA: Diagnosis not present

## 2017-12-28 DIAGNOSIS — N186 End stage renal disease: Secondary | ICD-10-CM | POA: Diagnosis not present

## 2017-12-28 DIAGNOSIS — N2581 Secondary hyperparathyroidism of renal origin: Secondary | ICD-10-CM | POA: Diagnosis not present

## 2017-12-30 ENCOUNTER — Inpatient Hospital Stay (HOSPITAL_COMMUNITY): Admission: RE | Admit: 2017-12-30 | Payer: BLUE CROSS/BLUE SHIELD | Source: Ambulatory Visit

## 2017-12-30 DIAGNOSIS — Z23 Encounter for immunization: Secondary | ICD-10-CM | POA: Diagnosis not present

## 2017-12-30 DIAGNOSIS — N186 End stage renal disease: Secondary | ICD-10-CM | POA: Diagnosis not present

## 2017-12-30 DIAGNOSIS — N2581 Secondary hyperparathyroidism of renal origin: Secondary | ICD-10-CM | POA: Diagnosis not present

## 2017-12-31 DIAGNOSIS — Z23 Encounter for immunization: Secondary | ICD-10-CM | POA: Insufficient documentation

## 2018-01-01 DIAGNOSIS — N186 End stage renal disease: Secondary | ICD-10-CM | POA: Diagnosis not present

## 2018-01-01 DIAGNOSIS — Z23 Encounter for immunization: Secondary | ICD-10-CM | POA: Diagnosis not present

## 2018-01-01 DIAGNOSIS — N2581 Secondary hyperparathyroidism of renal origin: Secondary | ICD-10-CM | POA: Diagnosis not present

## 2018-01-04 DIAGNOSIS — N186 End stage renal disease: Secondary | ICD-10-CM | POA: Diagnosis not present

## 2018-01-04 DIAGNOSIS — N2581 Secondary hyperparathyroidism of renal origin: Secondary | ICD-10-CM | POA: Diagnosis not present

## 2018-01-04 DIAGNOSIS — R51 Headache: Secondary | ICD-10-CM | POA: Diagnosis not present

## 2018-01-06 DIAGNOSIS — N186 End stage renal disease: Secondary | ICD-10-CM | POA: Diagnosis not present

## 2018-01-06 DIAGNOSIS — N2581 Secondary hyperparathyroidism of renal origin: Secondary | ICD-10-CM | POA: Diagnosis not present

## 2018-01-06 DIAGNOSIS — R51 Headache: Secondary | ICD-10-CM | POA: Diagnosis not present

## 2018-01-08 DIAGNOSIS — N186 End stage renal disease: Secondary | ICD-10-CM | POA: Diagnosis not present

## 2018-01-08 DIAGNOSIS — R51 Headache: Secondary | ICD-10-CM | POA: Diagnosis not present

## 2018-01-08 DIAGNOSIS — N2581 Secondary hyperparathyroidism of renal origin: Secondary | ICD-10-CM | POA: Diagnosis not present

## 2018-01-10 DIAGNOSIS — N186 End stage renal disease: Secondary | ICD-10-CM | POA: Diagnosis not present

## 2018-01-10 DIAGNOSIS — N2581 Secondary hyperparathyroidism of renal origin: Secondary | ICD-10-CM | POA: Diagnosis not present

## 2018-01-12 DIAGNOSIS — N2581 Secondary hyperparathyroidism of renal origin: Secondary | ICD-10-CM | POA: Diagnosis not present

## 2018-01-12 DIAGNOSIS — N186 End stage renal disease: Secondary | ICD-10-CM | POA: Diagnosis not present

## 2018-01-14 ENCOUNTER — Encounter (HOSPITAL_COMMUNITY): Payer: BLUE CROSS/BLUE SHIELD

## 2018-01-15 DIAGNOSIS — N2581 Secondary hyperparathyroidism of renal origin: Secondary | ICD-10-CM | POA: Diagnosis not present

## 2018-01-15 DIAGNOSIS — N186 End stage renal disease: Secondary | ICD-10-CM | POA: Diagnosis not present

## 2018-01-17 DIAGNOSIS — N186 End stage renal disease: Secondary | ICD-10-CM | POA: Diagnosis not present

## 2018-01-17 DIAGNOSIS — N2581 Secondary hyperparathyroidism of renal origin: Secondary | ICD-10-CM | POA: Diagnosis not present

## 2018-01-19 DIAGNOSIS — Z992 Dependence on renal dialysis: Secondary | ICD-10-CM | POA: Diagnosis not present

## 2018-01-19 DIAGNOSIS — I129 Hypertensive chronic kidney disease with stage 1 through stage 4 chronic kidney disease, or unspecified chronic kidney disease: Secondary | ICD-10-CM | POA: Diagnosis not present

## 2018-01-19 DIAGNOSIS — N186 End stage renal disease: Secondary | ICD-10-CM | POA: Diagnosis not present

## 2018-01-19 DIAGNOSIS — N2581 Secondary hyperparathyroidism of renal origin: Secondary | ICD-10-CM | POA: Diagnosis not present

## 2018-01-22 DIAGNOSIS — L299 Pruritus, unspecified: Secondary | ICD-10-CM | POA: Insufficient documentation

## 2018-01-22 DIAGNOSIS — N186 End stage renal disease: Secondary | ICD-10-CM | POA: Diagnosis not present

## 2018-01-22 DIAGNOSIS — N2581 Secondary hyperparathyroidism of renal origin: Secondary | ICD-10-CM | POA: Diagnosis not present

## 2018-01-25 DIAGNOSIS — N2581 Secondary hyperparathyroidism of renal origin: Secondary | ICD-10-CM | POA: Diagnosis not present

## 2018-01-25 DIAGNOSIS — Z23 Encounter for immunization: Secondary | ICD-10-CM | POA: Diagnosis not present

## 2018-01-25 DIAGNOSIS — N186 End stage renal disease: Secondary | ICD-10-CM | POA: Diagnosis not present

## 2018-01-27 DIAGNOSIS — Z23 Encounter for immunization: Secondary | ICD-10-CM | POA: Diagnosis not present

## 2018-01-27 DIAGNOSIS — N186 End stage renal disease: Secondary | ICD-10-CM | POA: Diagnosis not present

## 2018-01-27 DIAGNOSIS — N2581 Secondary hyperparathyroidism of renal origin: Secondary | ICD-10-CM | POA: Diagnosis not present

## 2018-01-29 DIAGNOSIS — Z23 Encounter for immunization: Secondary | ICD-10-CM | POA: Diagnosis not present

## 2018-01-29 DIAGNOSIS — N2581 Secondary hyperparathyroidism of renal origin: Secondary | ICD-10-CM | POA: Diagnosis not present

## 2018-01-29 DIAGNOSIS — N186 End stage renal disease: Secondary | ICD-10-CM | POA: Diagnosis not present

## 2018-02-01 DIAGNOSIS — N2581 Secondary hyperparathyroidism of renal origin: Secondary | ICD-10-CM | POA: Diagnosis not present

## 2018-02-01 DIAGNOSIS — N186 End stage renal disease: Secondary | ICD-10-CM | POA: Diagnosis not present

## 2018-02-03 DIAGNOSIS — N2581 Secondary hyperparathyroidism of renal origin: Secondary | ICD-10-CM | POA: Diagnosis not present

## 2018-02-03 DIAGNOSIS — N186 End stage renal disease: Secondary | ICD-10-CM | POA: Diagnosis not present

## 2018-02-05 DIAGNOSIS — N2581 Secondary hyperparathyroidism of renal origin: Secondary | ICD-10-CM | POA: Diagnosis not present

## 2018-02-05 DIAGNOSIS — N186 End stage renal disease: Secondary | ICD-10-CM | POA: Diagnosis not present

## 2018-02-08 DIAGNOSIS — N2581 Secondary hyperparathyroidism of renal origin: Secondary | ICD-10-CM | POA: Diagnosis not present

## 2018-02-08 DIAGNOSIS — N186 End stage renal disease: Secondary | ICD-10-CM | POA: Diagnosis not present

## 2018-02-10 DIAGNOSIS — N186 End stage renal disease: Secondary | ICD-10-CM | POA: Diagnosis not present

## 2018-02-10 DIAGNOSIS — N2581 Secondary hyperparathyroidism of renal origin: Secondary | ICD-10-CM | POA: Diagnosis not present

## 2018-02-12 DIAGNOSIS — N2581 Secondary hyperparathyroidism of renal origin: Secondary | ICD-10-CM | POA: Diagnosis not present

## 2018-02-12 DIAGNOSIS — N186 End stage renal disease: Secondary | ICD-10-CM | POA: Diagnosis not present

## 2018-02-12 DIAGNOSIS — R0602 Shortness of breath: Secondary | ICD-10-CM | POA: Insufficient documentation

## 2018-02-15 DIAGNOSIS — R51 Headache: Secondary | ICD-10-CM | POA: Diagnosis not present

## 2018-02-15 DIAGNOSIS — N186 End stage renal disease: Secondary | ICD-10-CM | POA: Diagnosis not present

## 2018-02-15 DIAGNOSIS — N2581 Secondary hyperparathyroidism of renal origin: Secondary | ICD-10-CM | POA: Diagnosis not present

## 2018-02-15 DIAGNOSIS — Z23 Encounter for immunization: Secondary | ICD-10-CM | POA: Diagnosis not present

## 2018-02-17 DIAGNOSIS — R51 Headache: Secondary | ICD-10-CM | POA: Diagnosis not present

## 2018-02-17 DIAGNOSIS — N186 End stage renal disease: Secondary | ICD-10-CM | POA: Diagnosis not present

## 2018-02-17 DIAGNOSIS — N2581 Secondary hyperparathyroidism of renal origin: Secondary | ICD-10-CM | POA: Diagnosis not present

## 2018-02-17 DIAGNOSIS — Z23 Encounter for immunization: Secondary | ICD-10-CM | POA: Diagnosis not present

## 2018-02-19 DIAGNOSIS — I129 Hypertensive chronic kidney disease with stage 1 through stage 4 chronic kidney disease, or unspecified chronic kidney disease: Secondary | ICD-10-CM | POA: Diagnosis not present

## 2018-02-19 DIAGNOSIS — Z992 Dependence on renal dialysis: Secondary | ICD-10-CM | POA: Diagnosis not present

## 2018-02-19 DIAGNOSIS — N2581 Secondary hyperparathyroidism of renal origin: Secondary | ICD-10-CM | POA: Diagnosis not present

## 2018-02-19 DIAGNOSIS — R51 Headache: Secondary | ICD-10-CM | POA: Diagnosis not present

## 2018-02-19 DIAGNOSIS — Z23 Encounter for immunization: Secondary | ICD-10-CM | POA: Diagnosis not present

## 2018-02-19 DIAGNOSIS — N186 End stage renal disease: Secondary | ICD-10-CM | POA: Diagnosis not present

## 2018-02-22 DIAGNOSIS — N2581 Secondary hyperparathyroidism of renal origin: Secondary | ICD-10-CM | POA: Diagnosis not present

## 2018-02-22 DIAGNOSIS — N186 End stage renal disease: Secondary | ICD-10-CM | POA: Diagnosis not present

## 2018-02-22 DIAGNOSIS — R52 Pain, unspecified: Secondary | ICD-10-CM | POA: Diagnosis not present

## 2018-02-24 ENCOUNTER — Encounter: Payer: BLUE CROSS/BLUE SHIELD | Admitting: Internal Medicine

## 2018-02-24 DIAGNOSIS — R52 Pain, unspecified: Secondary | ICD-10-CM | POA: Diagnosis not present

## 2018-02-24 DIAGNOSIS — N186 End stage renal disease: Secondary | ICD-10-CM | POA: Diagnosis not present

## 2018-02-24 DIAGNOSIS — N2581 Secondary hyperparathyroidism of renal origin: Secondary | ICD-10-CM | POA: Diagnosis not present

## 2018-02-26 DIAGNOSIS — R52 Pain, unspecified: Secondary | ICD-10-CM | POA: Diagnosis not present

## 2018-02-26 DIAGNOSIS — N2581 Secondary hyperparathyroidism of renal origin: Secondary | ICD-10-CM | POA: Diagnosis not present

## 2018-02-26 DIAGNOSIS — N186 End stage renal disease: Secondary | ICD-10-CM | POA: Diagnosis not present

## 2018-03-01 DIAGNOSIS — N186 End stage renal disease: Secondary | ICD-10-CM | POA: Diagnosis not present

## 2018-03-01 DIAGNOSIS — Z23 Encounter for immunization: Secondary | ICD-10-CM | POA: Diagnosis not present

## 2018-03-01 DIAGNOSIS — N2581 Secondary hyperparathyroidism of renal origin: Secondary | ICD-10-CM | POA: Diagnosis not present

## 2018-03-03 DIAGNOSIS — N2581 Secondary hyperparathyroidism of renal origin: Secondary | ICD-10-CM | POA: Diagnosis not present

## 2018-03-03 DIAGNOSIS — N186 End stage renal disease: Secondary | ICD-10-CM | POA: Diagnosis not present

## 2018-03-03 DIAGNOSIS — Z23 Encounter for immunization: Secondary | ICD-10-CM | POA: Diagnosis not present

## 2018-03-05 DIAGNOSIS — N186 End stage renal disease: Secondary | ICD-10-CM | POA: Diagnosis not present

## 2018-03-05 DIAGNOSIS — N2581 Secondary hyperparathyroidism of renal origin: Secondary | ICD-10-CM | POA: Diagnosis not present

## 2018-03-05 DIAGNOSIS — Z23 Encounter for immunization: Secondary | ICD-10-CM | POA: Diagnosis not present

## 2018-03-08 DIAGNOSIS — N2581 Secondary hyperparathyroidism of renal origin: Secondary | ICD-10-CM | POA: Diagnosis not present

## 2018-03-08 DIAGNOSIS — N186 End stage renal disease: Secondary | ICD-10-CM | POA: Diagnosis not present

## 2018-03-09 ENCOUNTER — Ambulatory Visit (INDEPENDENT_AMBULATORY_CARE_PROVIDER_SITE_OTHER): Payer: BLUE CROSS/BLUE SHIELD

## 2018-03-09 ENCOUNTER — Ambulatory Visit: Payer: BLUE CROSS/BLUE SHIELD | Admitting: Podiatry

## 2018-03-09 ENCOUNTER — Other Ambulatory Visit: Payer: Self-pay | Admitting: Podiatry

## 2018-03-09 ENCOUNTER — Encounter: Payer: Self-pay | Admitting: Podiatry

## 2018-03-09 DIAGNOSIS — M7732 Calcaneal spur, left foot: Secondary | ICD-10-CM

## 2018-03-09 DIAGNOSIS — M779 Enthesopathy, unspecified: Secondary | ICD-10-CM

## 2018-03-09 DIAGNOSIS — M722 Plantar fascial fibromatosis: Secondary | ICD-10-CM

## 2018-03-09 NOTE — Patient Instructions (Signed)
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
## 2018-03-10 ENCOUNTER — Encounter: Payer: Self-pay | Admitting: Internal Medicine

## 2018-03-10 ENCOUNTER — Other Ambulatory Visit: Payer: Self-pay

## 2018-03-10 ENCOUNTER — Ambulatory Visit: Payer: BLUE CROSS/BLUE SHIELD | Admitting: Internal Medicine

## 2018-03-10 VITALS — BP 141/65 | HR 85 | Temp 99.1°F | Ht 66.0 in | Wt 173.5 lb

## 2018-03-10 DIAGNOSIS — R1110 Vomiting, unspecified: Secondary | ICD-10-CM

## 2018-03-10 DIAGNOSIS — Z792 Long term (current) use of antibiotics: Secondary | ICD-10-CM

## 2018-03-10 DIAGNOSIS — I120 Hypertensive chronic kidney disease with stage 5 chronic kidney disease or end stage renal disease: Secondary | ICD-10-CM

## 2018-03-10 DIAGNOSIS — Z992 Dependence on renal dialysis: Secondary | ICD-10-CM

## 2018-03-10 DIAGNOSIS — N185 Chronic kidney disease, stage 5: Secondary | ICD-10-CM | POA: Diagnosis not present

## 2018-03-10 DIAGNOSIS — Z79899 Other long term (current) drug therapy: Secondary | ICD-10-CM

## 2018-03-10 DIAGNOSIS — N186 End stage renal disease: Secondary | ICD-10-CM

## 2018-03-10 DIAGNOSIS — I10 Essential (primary) hypertension: Secondary | ICD-10-CM

## 2018-03-10 DIAGNOSIS — G4700 Insomnia, unspecified: Secondary | ICD-10-CM

## 2018-03-10 DIAGNOSIS — Q613 Polycystic kidney, unspecified: Secondary | ICD-10-CM

## 2018-03-10 DIAGNOSIS — K2270 Barrett's esophagus without dysplasia: Secondary | ICD-10-CM

## 2018-03-10 DIAGNOSIS — Q612 Polycystic kidney, adult type: Secondary | ICD-10-CM | POA: Diagnosis not present

## 2018-03-10 DIAGNOSIS — Z87440 Personal history of urinary (tract) infections: Secondary | ICD-10-CM

## 2018-03-10 DIAGNOSIS — M722 Plantar fascial fibromatosis: Secondary | ICD-10-CM | POA: Insufficient documentation

## 2018-03-10 DIAGNOSIS — Z79891 Long term (current) use of opiate analgesic: Secondary | ICD-10-CM

## 2018-03-10 DIAGNOSIS — N2581 Secondary hyperparathyroidism of renal origin: Secondary | ICD-10-CM | POA: Diagnosis not present

## 2018-03-10 MED ORDER — TRAMADOL HCL 50 MG PO TABS: 50.0000 mg | ORAL_TABLET | Freq: Every day | ORAL | 0 refills | Status: DC | PRN

## 2018-03-10 MED ORDER — PANTOPRAZOLE SODIUM 40 MG PO TBEC: 40.0000 mg | DELAYED_RELEASE_TABLET | Freq: Every day | ORAL | 5 refills | Status: DC

## 2018-03-10 NOTE — Patient Instructions (Addendum)
I've refilled your tramadol.  Please call your surgeon who performed your pelvic prolapse surgery to set up an appointment as you keep having recurrent bladder infections.  For your acid reflux, start taking protonix once a day, about 30 minutes before your first meal of the day. Please call the stomach doctors at the number below to set up an appointment. They recommended last time you saw them to repeat an endoscopy in 2 years, which you are due for.  [facility], [place], [place] [address] [phone]  For sleep on dialysis days, try malatonin over the counter at 5mg ; take about 15 minutes before you try to go to sleep.  I will have our counselor, Miquel Dunn call you; think about setting up an appointment with her, I think it might be helpful as you are navigating these big changes recently.   We'll see you in about 3 months

## 2018-03-10 NOTE — Progress Notes (Signed)
Subjective: Kristen Moreno presents to the office today for concerns of pain to the bottom of her left heel which is been ongoing for the last 1 month. She states that she has had some swelling to the bottom of the heel she denies any recent injury or trauma.  She has been stretching and icing.  She gets some occasional tingling the bottom of her heel.  She gets some discomfort that goes up the arch of her foot as well.  Since I last saw her she has been started on dialysis. Denies any systemic complaints such as fevers, chills, nausea, vomiting. No acute changes since last appointment, and no other complaints at this time.   Objective: AAO x3, NAD DP/PT pulses palpable bilaterally, CRT less than 3 seconds There is tenderness palpation on the plantar medial tubercle of the calcaneus and insertion of plantar fascia as well as the plantar central aspect of the heel.  There is mild edema to the area.  Mild discomfort along the medial band plantar fashion the arch of the foot.  No pain on the Achilles tendon.  No pain with lateral compression of the calcaneus.  No other areas of tenderness.  Right side is doing well but any pain.  No open lesions or pre-ulcerative lesions.  No pain with calf compression, swelling, warmth, erythema  Assessment: Left heel pain, plantar fasciitis/heel spur  Plan: -All treatment options discussed with the patient including all alternatives, risks, complications.  -X-rays were obtained reviewed.  Calcaneal spurring is present.  No evidence of acute fracture. -Steroid injection performed.  See procedure note below. -Plantar fascial brace dispensed. -Night splint. -She has a surgical boot at home previously.  I want her to wear this when she gets off of work pain when she is not working. -Continue ice elevate and stretching, icing exercises daily. -Patient encouraged to call the office with any questions, concerns, change in symptoms.   Procedure: Injection  Tendon/Ligament Discussed alternatives, risks, complications and verbal consent was obtained.  Location: Left plantar fascia at the glabrous junction; medial approach. Skin Prep: Alcohol. Injectate: 0.5cc 0.5% marcaine plain, 0.5 cc 2% lidocaine plain and, 1 cc kenalog 10. Disposition: Patient tolerated procedure well. Injection site dressed with a band-aid.  Post-injection care was discussed and return precautions discussed.   Trula Slade DPM

## 2018-03-11 ENCOUNTER — Encounter: Payer: Self-pay | Admitting: Internal Medicine

## 2018-03-11 NOTE — Assessment & Plan Note (Addendum)
Patient has never had screening for cerebral aneurysms.  No prior history of cerebral aneurysms in her or her family that she knows of, however she is interested in the screening.  She continues to have abdominal pain which was previously helped with tramadol.  Plan - MRI brain -Refill tramadol 50 mg #20 per month x3; follow-up in 3 months

## 2018-03-11 NOTE — Assessment & Plan Note (Signed)
Patient endorses ongoing acid reflux.  She states she has not been on a PPI for a while (this might have been held when she had significant worsening of her renal function); she was on Zantac until it was recalled and is currently taking Pepcid.  She denies epigastric abdominal pain, vomiting, hematochezia or melena.  Plan - Protonix 40 mg daily - Advised that she call GI for repeat EGD for monitoring for her Barrett's esophagus

## 2018-03-11 NOTE — Assessment & Plan Note (Signed)
Patient recently started on dialysis which is been a significant and emotionally draining change for her.  Physically she is tolerating it well.  Plan - She is interested in possibly talking with our counselor for help with dealing with this change and decreased independence due to dialysis - Try melatonin 5 mg before bed on dialysis days

## 2018-03-11 NOTE — Assessment & Plan Note (Signed)
Blood pressure mildly elevated today.  She has been discontinued from all of her blood pressure medications since starting dialysis.  I will keep monitoring her blood pressure and I am sure the nephrologist will as well to see if she is a candidate for restarting antihypertensives in the future.

## 2018-03-12 DIAGNOSIS — N2581 Secondary hyperparathyroidism of renal origin: Secondary | ICD-10-CM | POA: Diagnosis not present

## 2018-03-12 DIAGNOSIS — N186 End stage renal disease: Secondary | ICD-10-CM | POA: Diagnosis not present

## 2018-03-12 NOTE — Progress Notes (Signed)
Internal Medicine Clinic Attending  Case discussed with Dr. Svalina  at the time of the visit.  We reviewed the resident's history and exam and pertinent patient test results.  I agree with the assessment, diagnosis, and plan of care documented in the resident's note.  

## 2018-03-15 DIAGNOSIS — N2581 Secondary hyperparathyroidism of renal origin: Secondary | ICD-10-CM | POA: Diagnosis not present

## 2018-03-15 DIAGNOSIS — N186 End stage renal disease: Secondary | ICD-10-CM | POA: Diagnosis not present

## 2018-03-17 ENCOUNTER — Telehealth: Payer: Self-pay | Admitting: Licensed Clinical Social Worker

## 2018-03-17 DIAGNOSIS — N186 End stage renal disease: Secondary | ICD-10-CM | POA: Diagnosis not present

## 2018-03-17 DIAGNOSIS — N2581 Secondary hyperparathyroidism of renal origin: Secondary | ICD-10-CM | POA: Diagnosis not present

## 2018-03-17 NOTE — Telephone Encounter (Signed)
Patient was contacted to offer services in our office for counseling. Patient did not answer, and a voicemail was left for the patient.

## 2018-03-19 DIAGNOSIS — N2889 Other specified disorders of kidney and ureter: Secondary | ICD-10-CM | POA: Insufficient documentation

## 2018-03-19 DIAGNOSIS — N186 End stage renal disease: Secondary | ICD-10-CM | POA: Diagnosis not present

## 2018-03-19 DIAGNOSIS — Z4902 Encounter for fitting and adjustment of peritoneal dialysis catheter: Secondary | ICD-10-CM | POA: Insufficient documentation

## 2018-03-19 DIAGNOSIS — N2581 Secondary hyperparathyroidism of renal origin: Secondary | ICD-10-CM | POA: Diagnosis not present

## 2018-03-20 DIAGNOSIS — I129 Hypertensive chronic kidney disease with stage 1 through stage 4 chronic kidney disease, or unspecified chronic kidney disease: Secondary | ICD-10-CM | POA: Diagnosis not present

## 2018-03-20 DIAGNOSIS — N186 End stage renal disease: Secondary | ICD-10-CM | POA: Diagnosis not present

## 2018-03-20 DIAGNOSIS — Z992 Dependence on renal dialysis: Secondary | ICD-10-CM | POA: Diagnosis not present

## 2018-03-22 DIAGNOSIS — N2581 Secondary hyperparathyroidism of renal origin: Secondary | ICD-10-CM | POA: Diagnosis not present

## 2018-03-22 DIAGNOSIS — N186 End stage renal disease: Secondary | ICD-10-CM | POA: Diagnosis not present

## 2018-03-24 DIAGNOSIS — N186 End stage renal disease: Secondary | ICD-10-CM | POA: Diagnosis not present

## 2018-03-24 DIAGNOSIS — N2581 Secondary hyperparathyroidism of renal origin: Secondary | ICD-10-CM | POA: Diagnosis not present

## 2018-03-26 DIAGNOSIS — N2581 Secondary hyperparathyroidism of renal origin: Secondary | ICD-10-CM | POA: Diagnosis not present

## 2018-03-26 DIAGNOSIS — N186 End stage renal disease: Secondary | ICD-10-CM | POA: Diagnosis not present

## 2018-03-29 DIAGNOSIS — N186 End stage renal disease: Secondary | ICD-10-CM | POA: Diagnosis not present

## 2018-03-29 DIAGNOSIS — N2581 Secondary hyperparathyroidism of renal origin: Secondary | ICD-10-CM | POA: Diagnosis not present

## 2018-03-30 ENCOUNTER — Ambulatory Visit: Payer: BLUE CROSS/BLUE SHIELD | Admitting: Podiatry

## 2018-03-30 DIAGNOSIS — R9431 Abnormal electrocardiogram [ECG] [EKG]: Secondary | ICD-10-CM | POA: Diagnosis not present

## 2018-03-30 DIAGNOSIS — N185 Chronic kidney disease, stage 5: Secondary | ICD-10-CM | POA: Diagnosis not present

## 2018-03-30 DIAGNOSIS — Z9889 Other specified postprocedural states: Secondary | ICD-10-CM | POA: Diagnosis not present

## 2018-03-30 DIAGNOSIS — G4730 Sleep apnea, unspecified: Secondary | ICD-10-CM | POA: Diagnosis not present

## 2018-03-30 DIAGNOSIS — Z4902 Encounter for fitting and adjustment of peritoneal dialysis catheter: Secondary | ICD-10-CM | POA: Diagnosis not present

## 2018-03-30 DIAGNOSIS — I120 Hypertensive chronic kidney disease with stage 5 chronic kidney disease or end stage renal disease: Secondary | ICD-10-CM | POA: Diagnosis not present

## 2018-03-31 DIAGNOSIS — N186 End stage renal disease: Secondary | ICD-10-CM | POA: Diagnosis not present

## 2018-03-31 DIAGNOSIS — N2581 Secondary hyperparathyroidism of renal origin: Secondary | ICD-10-CM | POA: Diagnosis not present

## 2018-04-02 ENCOUNTER — Encounter: Payer: Self-pay | Admitting: Podiatry

## 2018-04-02 ENCOUNTER — Ambulatory Visit: Payer: BLUE CROSS/BLUE SHIELD | Admitting: Podiatry

## 2018-04-02 ENCOUNTER — Other Ambulatory Visit: Payer: Self-pay

## 2018-04-02 DIAGNOSIS — M722 Plantar fascial fibromatosis: Secondary | ICD-10-CM

## 2018-04-02 DIAGNOSIS — N186 End stage renal disease: Secondary | ICD-10-CM | POA: Diagnosis not present

## 2018-04-02 DIAGNOSIS — N2581 Secondary hyperparathyroidism of renal origin: Secondary | ICD-10-CM | POA: Diagnosis not present

## 2018-04-02 NOTE — Patient Instructions (Signed)

## 2018-04-05 DIAGNOSIS — N186 End stage renal disease: Secondary | ICD-10-CM | POA: Diagnosis not present

## 2018-04-05 DIAGNOSIS — N2581 Secondary hyperparathyroidism of renal origin: Secondary | ICD-10-CM | POA: Diagnosis not present

## 2018-04-06 ENCOUNTER — Ambulatory Visit (HOSPITAL_COMMUNITY)
Admission: RE | Admit: 2018-04-06 | Discharge: 2018-04-06 | Disposition: A | Discharge status: Home / Self Care | Payer: BLUE CROSS/BLUE SHIELD | Source: Ambulatory Visit | Attending: Internal Medicine | Admitting: Internal Medicine

## 2018-04-06 ENCOUNTER — Other Ambulatory Visit: Payer: Self-pay

## 2018-04-06 DIAGNOSIS — Q613 Polycystic kidney, unspecified: Secondary | ICD-10-CM | POA: Diagnosis not present

## 2018-04-06 DIAGNOSIS — I671 Cerebral aneurysm, nonruptured: Secondary | ICD-10-CM | POA: Diagnosis not present

## 2018-04-07 DIAGNOSIS — N2581 Secondary hyperparathyroidism of renal origin: Secondary | ICD-10-CM | POA: Diagnosis not present

## 2018-04-07 DIAGNOSIS — N186 End stage renal disease: Secondary | ICD-10-CM | POA: Diagnosis not present

## 2018-04-09 DIAGNOSIS — N2581 Secondary hyperparathyroidism of renal origin: Secondary | ICD-10-CM | POA: Diagnosis not present

## 2018-04-09 DIAGNOSIS — N186 End stage renal disease: Secondary | ICD-10-CM | POA: Diagnosis not present

## 2018-04-11 NOTE — Progress Notes (Signed)
Subjective: 58 year old female presents the office today for follow-up evaluation of left heel pain, plantar fasciitis.  She states that overall she is doing better but she still gets some discomfort.  The injection did help as well as the brace.  She recently just had surgery for in-house dialysis she had been off of her feet more.  Denies any systemic complaints such as fevers, chills, nausea, vomiting. No acute changes since last appointment, and no other complaints at this time.   Objective: AAO x3, NAD DP/PT pulses palpable bilaterally, CRT less than 3 seconds There is decreased but continued tenderness palpation to the plantar fascial plantar medial aspect.  There is no pain with forced plantar fashion the arch of the foot.  Achilles tendon appears to be intact.  No other areas of tenderness.  Negative Tinel sign.  No open lesions or pre-ulcerative lesions.  No pain with calf compression, swelling, warmth, erythema  Assessment: Improving left foot pain, plantar fasciitis   Plan: -All treatment options discussed with the patient including all alternatives, risks, complications.  -Overall she is doing better.  Because of her recent surgery she was told on a steroid injection I agree.  Discussed continuing stretching, ice exercises daily.  Continue with supportive shoes.  Continue with night splint, but fascial brace. -Patient encouraged to call the office with any questions, concerns, change in symptoms.   Trula Slade DPM

## 2018-04-12 DIAGNOSIS — N186 End stage renal disease: Secondary | ICD-10-CM | POA: Diagnosis not present

## 2018-04-12 DIAGNOSIS — N2581 Secondary hyperparathyroidism of renal origin: Secondary | ICD-10-CM | POA: Diagnosis not present

## 2018-04-14 DIAGNOSIS — N2581 Secondary hyperparathyroidism of renal origin: Secondary | ICD-10-CM | POA: Diagnosis not present

## 2018-04-14 DIAGNOSIS — N186 End stage renal disease: Secondary | ICD-10-CM | POA: Diagnosis not present

## 2018-04-16 DIAGNOSIS — N186 End stage renal disease: Secondary | ICD-10-CM | POA: Diagnosis not present

## 2018-04-16 DIAGNOSIS — N2581 Secondary hyperparathyroidism of renal origin: Secondary | ICD-10-CM | POA: Diagnosis not present

## 2018-04-17 DIAGNOSIS — D509 Iron deficiency anemia, unspecified: Secondary | ICD-10-CM | POA: Insufficient documentation

## 2018-04-17 DIAGNOSIS — Z79899 Other long term (current) drug therapy: Secondary | ICD-10-CM | POA: Insufficient documentation

## 2018-04-17 DIAGNOSIS — E789 Disorder of lipoprotein metabolism, unspecified: Secondary | ICD-10-CM | POA: Insufficient documentation

## 2018-04-17 DIAGNOSIS — R17 Unspecified jaundice: Secondary | ICD-10-CM | POA: Insufficient documentation

## 2018-04-17 DIAGNOSIS — Z4932 Encounter for adequacy testing for peritoneal dialysis: Secondary | ICD-10-CM | POA: Insufficient documentation

## 2018-04-17 DIAGNOSIS — R82998 Other abnormal findings in urine: Secondary | ICD-10-CM | POA: Insufficient documentation

## 2018-04-19 DIAGNOSIS — N2581 Secondary hyperparathyroidism of renal origin: Secondary | ICD-10-CM | POA: Diagnosis not present

## 2018-04-19 DIAGNOSIS — N186 End stage renal disease: Secondary | ICD-10-CM | POA: Diagnosis not present

## 2018-04-20 DIAGNOSIS — N2581 Secondary hyperparathyroidism of renal origin: Secondary | ICD-10-CM | POA: Diagnosis not present

## 2018-04-20 DIAGNOSIS — I129 Hypertensive chronic kidney disease with stage 1 through stage 4 chronic kidney disease, or unspecified chronic kidney disease: Secondary | ICD-10-CM | POA: Diagnosis not present

## 2018-04-20 DIAGNOSIS — Z992 Dependence on renal dialysis: Secondary | ICD-10-CM | POA: Diagnosis not present

## 2018-04-20 DIAGNOSIS — N186 End stage renal disease: Secondary | ICD-10-CM | POA: Diagnosis not present

## 2018-04-21 DIAGNOSIS — N2581 Secondary hyperparathyroidism of renal origin: Secondary | ICD-10-CM | POA: Diagnosis not present

## 2018-04-21 DIAGNOSIS — N186 End stage renal disease: Secondary | ICD-10-CM | POA: Diagnosis not present

## 2018-04-22 DIAGNOSIS — N186 End stage renal disease: Secondary | ICD-10-CM | POA: Diagnosis not present

## 2018-04-22 DIAGNOSIS — Z23 Encounter for immunization: Secondary | ICD-10-CM | POA: Diagnosis not present

## 2018-04-22 DIAGNOSIS — N2581 Secondary hyperparathyroidism of renal origin: Secondary | ICD-10-CM | POA: Diagnosis not present

## 2018-04-23 DIAGNOSIS — Z23 Encounter for immunization: Secondary | ICD-10-CM | POA: Diagnosis not present

## 2018-04-23 DIAGNOSIS — N2581 Secondary hyperparathyroidism of renal origin: Secondary | ICD-10-CM | POA: Diagnosis not present

## 2018-04-23 DIAGNOSIS — N186 End stage renal disease: Secondary | ICD-10-CM | POA: Diagnosis not present

## 2018-04-25 DIAGNOSIS — N2581 Secondary hyperparathyroidism of renal origin: Secondary | ICD-10-CM | POA: Diagnosis not present

## 2018-04-25 DIAGNOSIS — N186 End stage renal disease: Secondary | ICD-10-CM | POA: Diagnosis not present

## 2018-04-27 DIAGNOSIS — N186 End stage renal disease: Secondary | ICD-10-CM | POA: Diagnosis not present

## 2018-04-27 DIAGNOSIS — N2581 Secondary hyperparathyroidism of renal origin: Secondary | ICD-10-CM | POA: Diagnosis not present

## 2018-04-28 DIAGNOSIS — N186 End stage renal disease: Secondary | ICD-10-CM | POA: Diagnosis not present

## 2018-04-28 DIAGNOSIS — N2581 Secondary hyperparathyroidism of renal origin: Secondary | ICD-10-CM | POA: Diagnosis not present

## 2018-04-29 ENCOUNTER — Ambulatory Visit
Admission: RE | Admit: 2018-04-29 | Discharge: 2018-04-29 | Disposition: A | Discharge status: Home / Self Care | Payer: BLUE CROSS/BLUE SHIELD | Source: Ambulatory Visit | Attending: Nephrology | Admitting: Nephrology

## 2018-04-29 ENCOUNTER — Other Ambulatory Visit: Payer: Self-pay | Admitting: Nephrology

## 2018-04-29 ENCOUNTER — Other Ambulatory Visit: Payer: Self-pay

## 2018-04-29 DIAGNOSIS — R109 Unspecified abdominal pain: Secondary | ICD-10-CM | POA: Diagnosis not present

## 2018-04-29 DIAGNOSIS — R1031 Right lower quadrant pain: Secondary | ICD-10-CM

## 2018-04-30 DIAGNOSIS — R1031 Right lower quadrant pain: Secondary | ICD-10-CM | POA: Diagnosis not present

## 2018-04-30 DIAGNOSIS — N2581 Secondary hyperparathyroidism of renal origin: Secondary | ICD-10-CM | POA: Diagnosis not present

## 2018-04-30 DIAGNOSIS — N186 End stage renal disease: Secondary | ICD-10-CM | POA: Diagnosis not present

## 2018-05-03 DIAGNOSIS — N2581 Secondary hyperparathyroidism of renal origin: Secondary | ICD-10-CM | POA: Diagnosis not present

## 2018-05-03 DIAGNOSIS — N186 End stage renal disease: Secondary | ICD-10-CM | POA: Diagnosis not present

## 2018-05-04 DIAGNOSIS — N2581 Secondary hyperparathyroidism of renal origin: Secondary | ICD-10-CM | POA: Diagnosis not present

## 2018-05-04 DIAGNOSIS — N186 End stage renal disease: Secondary | ICD-10-CM | POA: Diagnosis not present

## 2018-05-05 DIAGNOSIS — R1031 Right lower quadrant pain: Secondary | ICD-10-CM | POA: Diagnosis not present

## 2018-05-05 DIAGNOSIS — K4090 Unilateral inguinal hernia, without obstruction or gangrene, not specified as recurrent: Secondary | ICD-10-CM | POA: Diagnosis not present

## 2018-05-05 DIAGNOSIS — N2581 Secondary hyperparathyroidism of renal origin: Secondary | ICD-10-CM | POA: Diagnosis not present

## 2018-05-05 DIAGNOSIS — R7309 Other abnormal glucose: Secondary | ICD-10-CM | POA: Insufficient documentation

## 2018-05-05 DIAGNOSIS — N186 End stage renal disease: Secondary | ICD-10-CM | POA: Diagnosis not present

## 2018-05-06 DIAGNOSIS — N2581 Secondary hyperparathyroidism of renal origin: Secondary | ICD-10-CM | POA: Diagnosis not present

## 2018-05-06 DIAGNOSIS — N186 End stage renal disease: Secondary | ICD-10-CM | POA: Diagnosis not present

## 2018-05-07 ENCOUNTER — Other Ambulatory Visit: Payer: Self-pay

## 2018-05-07 ENCOUNTER — Ambulatory Visit: Payer: BLUE CROSS/BLUE SHIELD | Admitting: Podiatry

## 2018-05-07 DIAGNOSIS — N186 End stage renal disease: Secondary | ICD-10-CM | POA: Diagnosis not present

## 2018-05-07 DIAGNOSIS — M722 Plantar fascial fibromatosis: Secondary | ICD-10-CM | POA: Diagnosis not present

## 2018-05-07 DIAGNOSIS — M7732 Calcaneal spur, left foot: Secondary | ICD-10-CM

## 2018-05-07 DIAGNOSIS — N2581 Secondary hyperparathyroidism of renal origin: Secondary | ICD-10-CM | POA: Diagnosis not present

## 2018-05-07 NOTE — Progress Notes (Signed)
Subjective: 58 year old female presents the office today for follow-up valuation of left heel pain, plantar fasciitis.  She states that overall she is doing better but she still has some discomfort.  She has been out of work for other issues but she did return to work today.  She has no swelling.  Pain is intermittent.  She has no other concerns. Denies any systemic complaints such as fevers, chills, nausea, vomiting. No acute changes since last appointment, and no other complaints at this time.   Objective: AAO x3, NAD DP/PT pulses palpable bilaterally, CRT less than 3 seconds There is continued but improved tenderness to palpation along the plantar medial tubercle of the calcaneus at the insertion of the plantar fascia on the left side.  There is no pain with lateral compression of calcaneus.  No pain the course or insertion of Achilles tendon.  No areas of pinpoint tenderness.  There is no edema, erythema.  Negative Tinel sign. No open lesions or pre-ulcerative lesions.  No pain with calf compression, swelling, warmth, erythema  Assessment: Left foot plantar fasciitis  Plan: -All treatment options discussed with the patient including all alternatives, risks, complications.  -She was to hold off on steroid injection today.  I want her to continue stretching, ice exercises daily as well as wearing supportive shoes and inserts.  Plantar fascial strapping was applied today.  Precautions were advised on when to remove this. -Patient encouraged to call the office with any questions, concerns, change in symptoms.   RTC 4-6 weeks or sooner if needed  Trula Slade DPM

## 2018-05-08 DIAGNOSIS — N2581 Secondary hyperparathyroidism of renal origin: Secondary | ICD-10-CM | POA: Diagnosis not present

## 2018-05-08 DIAGNOSIS — N186 End stage renal disease: Secondary | ICD-10-CM | POA: Diagnosis not present

## 2018-05-09 DIAGNOSIS — N186 End stage renal disease: Secondary | ICD-10-CM | POA: Diagnosis not present

## 2018-05-09 DIAGNOSIS — N2581 Secondary hyperparathyroidism of renal origin: Secondary | ICD-10-CM | POA: Diagnosis not present

## 2018-05-10 DIAGNOSIS — N186 End stage renal disease: Secondary | ICD-10-CM | POA: Diagnosis not present

## 2018-05-10 DIAGNOSIS — N2581 Secondary hyperparathyroidism of renal origin: Secondary | ICD-10-CM | POA: Diagnosis not present

## 2018-05-11 ENCOUNTER — Encounter: Payer: Self-pay | Admitting: Licensed Clinical Social Worker

## 2018-05-11 ENCOUNTER — Telehealth: Payer: Self-pay | Admitting: Licensed Clinical Social Worker

## 2018-05-11 DIAGNOSIS — N2581 Secondary hyperparathyroidism of renal origin: Secondary | ICD-10-CM | POA: Diagnosis not present

## 2018-05-11 DIAGNOSIS — N186 End stage renal disease: Secondary | ICD-10-CM | POA: Diagnosis not present

## 2018-05-11 NOTE — Telephone Encounter (Signed)
Patient was contacted due to a referral from her PCP. Patient did not answer, and a voicemail was left for the patient. A letter will also be mailed.

## 2018-05-12 DIAGNOSIS — N186 End stage renal disease: Secondary | ICD-10-CM | POA: Diagnosis not present

## 2018-05-12 DIAGNOSIS — N2581 Secondary hyperparathyroidism of renal origin: Secondary | ICD-10-CM | POA: Diagnosis not present

## 2018-05-13 DIAGNOSIS — N186 End stage renal disease: Secondary | ICD-10-CM | POA: Diagnosis not present

## 2018-05-13 DIAGNOSIS — N2581 Secondary hyperparathyroidism of renal origin: Secondary | ICD-10-CM | POA: Diagnosis not present

## 2018-05-14 DIAGNOSIS — N2581 Secondary hyperparathyroidism of renal origin: Secondary | ICD-10-CM | POA: Diagnosis not present

## 2018-05-14 DIAGNOSIS — N186 End stage renal disease: Secondary | ICD-10-CM | POA: Diagnosis not present

## 2018-05-15 DIAGNOSIS — N2581 Secondary hyperparathyroidism of renal origin: Secondary | ICD-10-CM | POA: Diagnosis not present

## 2018-05-15 DIAGNOSIS — N186 End stage renal disease: Secondary | ICD-10-CM | POA: Diagnosis not present

## 2018-05-16 DIAGNOSIS — N186 End stage renal disease: Secondary | ICD-10-CM | POA: Diagnosis not present

## 2018-05-16 DIAGNOSIS — N2581 Secondary hyperparathyroidism of renal origin: Secondary | ICD-10-CM | POA: Diagnosis not present

## 2018-05-17 DIAGNOSIS — N186 End stage renal disease: Secondary | ICD-10-CM | POA: Diagnosis not present

## 2018-05-17 DIAGNOSIS — N2581 Secondary hyperparathyroidism of renal origin: Secondary | ICD-10-CM | POA: Diagnosis not present

## 2018-05-18 DIAGNOSIS — N186 End stage renal disease: Secondary | ICD-10-CM | POA: Diagnosis not present

## 2018-05-18 DIAGNOSIS — N2581 Secondary hyperparathyroidism of renal origin: Secondary | ICD-10-CM | POA: Diagnosis not present

## 2018-05-19 DIAGNOSIS — Q613 Polycystic kidney, unspecified: Secondary | ICD-10-CM | POA: Diagnosis not present

## 2018-05-19 DIAGNOSIS — N186 End stage renal disease: Secondary | ICD-10-CM | POA: Diagnosis not present

## 2018-05-19 DIAGNOSIS — N185 Chronic kidney disease, stage 5: Secondary | ICD-10-CM | POA: Diagnosis not present

## 2018-05-19 DIAGNOSIS — R1031 Right lower quadrant pain: Secondary | ICD-10-CM | POA: Diagnosis not present

## 2018-05-19 DIAGNOSIS — K4090 Unilateral inguinal hernia, without obstruction or gangrene, not specified as recurrent: Secondary | ICD-10-CM | POA: Diagnosis not present

## 2018-05-19 DIAGNOSIS — N2581 Secondary hyperparathyroidism of renal origin: Secondary | ICD-10-CM | POA: Diagnosis not present

## 2018-05-20 DIAGNOSIS — Z992 Dependence on renal dialysis: Secondary | ICD-10-CM | POA: Diagnosis not present

## 2018-05-20 DIAGNOSIS — N2581 Secondary hyperparathyroidism of renal origin: Secondary | ICD-10-CM | POA: Diagnosis not present

## 2018-05-20 DIAGNOSIS — I129 Hypertensive chronic kidney disease with stage 1 through stage 4 chronic kidney disease, or unspecified chronic kidney disease: Secondary | ICD-10-CM | POA: Diagnosis not present

## 2018-05-20 DIAGNOSIS — N186 End stage renal disease: Secondary | ICD-10-CM | POA: Diagnosis not present

## 2018-05-21 DIAGNOSIS — N2581 Secondary hyperparathyroidism of renal origin: Secondary | ICD-10-CM | POA: Diagnosis not present

## 2018-05-21 DIAGNOSIS — N186 End stage renal disease: Secondary | ICD-10-CM | POA: Diagnosis not present

## 2018-05-22 DIAGNOSIS — N2581 Secondary hyperparathyroidism of renal origin: Secondary | ICD-10-CM | POA: Diagnosis not present

## 2018-05-22 DIAGNOSIS — N186 End stage renal disease: Secondary | ICD-10-CM | POA: Diagnosis not present

## 2018-05-23 DIAGNOSIS — N186 End stage renal disease: Secondary | ICD-10-CM | POA: Diagnosis not present

## 2018-05-23 DIAGNOSIS — N2581 Secondary hyperparathyroidism of renal origin: Secondary | ICD-10-CM | POA: Diagnosis not present

## 2018-05-24 DIAGNOSIS — N2581 Secondary hyperparathyroidism of renal origin: Secondary | ICD-10-CM | POA: Diagnosis not present

## 2018-05-24 DIAGNOSIS — N186 End stage renal disease: Secondary | ICD-10-CM | POA: Diagnosis not present

## 2018-05-25 DIAGNOSIS — N2581 Secondary hyperparathyroidism of renal origin: Secondary | ICD-10-CM | POA: Diagnosis not present

## 2018-05-25 DIAGNOSIS — N186 End stage renal disease: Secondary | ICD-10-CM | POA: Diagnosis not present

## 2018-05-26 DIAGNOSIS — N186 End stage renal disease: Secondary | ICD-10-CM | POA: Diagnosis not present

## 2018-05-26 DIAGNOSIS — N2581 Secondary hyperparathyroidism of renal origin: Secondary | ICD-10-CM | POA: Diagnosis not present

## 2018-05-27 DIAGNOSIS — N2581 Secondary hyperparathyroidism of renal origin: Secondary | ICD-10-CM | POA: Diagnosis not present

## 2018-05-27 DIAGNOSIS — N186 End stage renal disease: Secondary | ICD-10-CM | POA: Diagnosis not present

## 2018-05-28 DIAGNOSIS — N2581 Secondary hyperparathyroidism of renal origin: Secondary | ICD-10-CM | POA: Diagnosis not present

## 2018-05-28 DIAGNOSIS — N186 End stage renal disease: Secondary | ICD-10-CM | POA: Diagnosis not present

## 2018-05-29 DIAGNOSIS — N186 End stage renal disease: Secondary | ICD-10-CM | POA: Diagnosis not present

## 2018-05-29 DIAGNOSIS — N2581 Secondary hyperparathyroidism of renal origin: Secondary | ICD-10-CM | POA: Diagnosis not present

## 2018-05-30 DIAGNOSIS — N2581 Secondary hyperparathyroidism of renal origin: Secondary | ICD-10-CM | POA: Diagnosis not present

## 2018-05-30 DIAGNOSIS — N186 End stage renal disease: Secondary | ICD-10-CM | POA: Diagnosis not present

## 2018-05-31 DIAGNOSIS — N2581 Secondary hyperparathyroidism of renal origin: Secondary | ICD-10-CM | POA: Diagnosis not present

## 2018-05-31 DIAGNOSIS — N186 End stage renal disease: Secondary | ICD-10-CM | POA: Diagnosis not present

## 2018-06-01 DIAGNOSIS — N2581 Secondary hyperparathyroidism of renal origin: Secondary | ICD-10-CM | POA: Diagnosis not present

## 2018-06-01 DIAGNOSIS — N186 End stage renal disease: Secondary | ICD-10-CM | POA: Diagnosis not present

## 2018-06-02 DIAGNOSIS — N2581 Secondary hyperparathyroidism of renal origin: Secondary | ICD-10-CM | POA: Diagnosis not present

## 2018-06-02 DIAGNOSIS — N186 End stage renal disease: Secondary | ICD-10-CM | POA: Diagnosis not present

## 2018-06-03 DIAGNOSIS — N186 End stage renal disease: Secondary | ICD-10-CM | POA: Diagnosis not present

## 2018-06-03 DIAGNOSIS — N2581 Secondary hyperparathyroidism of renal origin: Secondary | ICD-10-CM | POA: Diagnosis not present

## 2018-06-04 ENCOUNTER — Ambulatory Visit: Payer: BLUE CROSS/BLUE SHIELD | Admitting: Podiatry

## 2018-06-04 DIAGNOSIS — N2581 Secondary hyperparathyroidism of renal origin: Secondary | ICD-10-CM | POA: Diagnosis not present

## 2018-06-04 DIAGNOSIS — N186 End stage renal disease: Secondary | ICD-10-CM | POA: Diagnosis not present

## 2018-06-05 DIAGNOSIS — N2581 Secondary hyperparathyroidism of renal origin: Secondary | ICD-10-CM | POA: Diagnosis not present

## 2018-06-05 DIAGNOSIS — N186 End stage renal disease: Secondary | ICD-10-CM | POA: Diagnosis not present

## 2018-06-06 DIAGNOSIS — N2581 Secondary hyperparathyroidism of renal origin: Secondary | ICD-10-CM | POA: Diagnosis not present

## 2018-06-06 DIAGNOSIS — N186 End stage renal disease: Secondary | ICD-10-CM | POA: Diagnosis not present

## 2018-06-07 DIAGNOSIS — N2581 Secondary hyperparathyroidism of renal origin: Secondary | ICD-10-CM | POA: Diagnosis not present

## 2018-06-07 DIAGNOSIS — N186 End stage renal disease: Secondary | ICD-10-CM | POA: Diagnosis not present

## 2018-06-08 DIAGNOSIS — N186 End stage renal disease: Secondary | ICD-10-CM | POA: Diagnosis not present

## 2018-06-08 DIAGNOSIS — N2581 Secondary hyperparathyroidism of renal origin: Secondary | ICD-10-CM | POA: Diagnosis not present

## 2018-06-09 ENCOUNTER — Ambulatory Visit (INDEPENDENT_AMBULATORY_CARE_PROVIDER_SITE_OTHER): Payer: BLUE CROSS/BLUE SHIELD | Admitting: Internal Medicine

## 2018-06-09 ENCOUNTER — Encounter: Payer: Self-pay | Admitting: Internal Medicine

## 2018-06-09 ENCOUNTER — Other Ambulatory Visit: Payer: Self-pay

## 2018-06-09 VITALS — BP 148/78 | HR 93 | Temp 98.0°F | Ht 66.0 in | Wt 180.9 lb

## 2018-06-09 DIAGNOSIS — R1031 Right lower quadrant pain: Secondary | ICD-10-CM

## 2018-06-09 DIAGNOSIS — Z992 Dependence on renal dialysis: Secondary | ICD-10-CM

## 2018-06-09 DIAGNOSIS — I10 Essential (primary) hypertension: Secondary | ICD-10-CM

## 2018-06-09 DIAGNOSIS — N186 End stage renal disease: Secondary | ICD-10-CM

## 2018-06-09 DIAGNOSIS — Q613 Polycystic kidney, unspecified: Secondary | ICD-10-CM | POA: Diagnosis not present

## 2018-06-09 DIAGNOSIS — Z791 Long term (current) use of non-steroidal anti-inflammatories (NSAID): Secondary | ICD-10-CM

## 2018-06-09 DIAGNOSIS — K2270 Barrett's esophagus without dysplasia: Secondary | ICD-10-CM

## 2018-06-09 DIAGNOSIS — I120 Hypertensive chronic kidney disease with stage 5 chronic kidney disease or end stage renal disease: Secondary | ICD-10-CM

## 2018-06-09 DIAGNOSIS — K4090 Unilateral inguinal hernia, without obstruction or gangrene, not specified as recurrent: Secondary | ICD-10-CM | POA: Diagnosis not present

## 2018-06-09 DIAGNOSIS — Z79899 Other long term (current) drug therapy: Secondary | ICD-10-CM

## 2018-06-09 DIAGNOSIS — K219 Gastro-esophageal reflux disease without esophagitis: Secondary | ICD-10-CM

## 2018-06-09 DIAGNOSIS — N2581 Secondary hyperparathyroidism of renal origin: Secondary | ICD-10-CM | POA: Diagnosis not present

## 2018-06-09 DIAGNOSIS — G8929 Other chronic pain: Secondary | ICD-10-CM

## 2018-06-09 MED ORDER — OXYCODONE HCL 5 MG PO TABS: 2.5000 mg | ORAL_TABLET | Freq: Three times a day (TID) | ORAL | 0 refills | Status: DC | PRN

## 2018-06-09 NOTE — Patient Instructions (Signed)
For your pain, you can take oxycodone 2.5mg  up to three times a day as needed. This will last you until your appointment with surgery. Start taking a stool softener or laxative like senna to help prevent constipation. Please don't drive while taking this medication until you know how it affects you. Side effects include sleepiness, dizziness, nausea, constipation.  Opioid Pain Medicine Information Opioid pain medicines are strong medicines that are used to treat serious pain. Only take these medicines while you are working with a doctor. You should only take them for short amounts of time, if your doctor says that you can. When you take these medicines for short amounts of time, they can help you:  Do better in physical therapy.  Feel better during the first few days after an injury. Work with your doctor to make a plan for treating your pain. Talk about:  How much pain you can expect to have.  How long you are likely to have pain. What are the risks? Opioid pain medicines can cause problems (side effects). Taking them for more than 3 days raises your chance of problems, such as:  Trouble pooping (constipation).  Feeling sick to your stomach (nausea).  Throwing up (vomiting).  Sleepiness.  Confusion.  Breathing problems.  Not being able to stop taking the medicine (addiction). Taking opioid pain medicine for a long time can put you at risk for:  Car accidents.  Depression.  Heart attack.  Taking too much of the medicine (overdose).  Painful symptoms after you stop the medicine (withdrawal).  Suicide and death. Follow these instructions at home: Safety and storage      While you are taking opioid pain medicine: ? Do not drive. ? Do not use machines or power tools. ? Do not sign important papers (legal documents). ? Do not drink alcohol. ? Do not take sleeping pills. ? Do not take care of children by yourself. ? Do not do activities with climbing or being in high  places, like working on a ladder. ? Do not go into any water, such a lake, river, ocean, pool, or hot tub.  Keep your pain medicine locked up, or in a place where children cannot reach it.  Do not share your pain medicine with anyone. Getting rid of leftover pills Do not save any leftover pills. Get rid of leftover pills safely by:  Taking them to a take-back program in your area.  Bringing them to a pharmacy that has a container for throwing away pills (pill disposal).  Safely throwing them in the trash. To do this: 1. Mix the pills with pet poop or food scraps. 2. Put the mixture in a closed container or bag. 3. Throw the container or bag in the trash. General instructions  Work with your doctor to find other ways to manage your pain, such as: ? Physical therapy. ? Massage. ? Counseling. ? Diet and exercise. ? Meditation. ? Other pain medicines.  Get your pain medicine prescription from only one doctor.  If you have been taking opioid pain medicines for more than a few weeks, do not try to stop taking them by yourself. Work with your doctor to stop. ? Your doctor will help you take less and less (taper) until you are not taking the medicine at all. This can lower your chance of having painful symptoms after you stop taking the medicine.  Keep all follow-up visits as told by your doctor. This is important. Contact a doctor if:  You have  problems because of your medicines.  You need help to stop taking your medicines.  You have questions about how to use your medicines safely. Get help right away if:  You have trouble breathing.  You have a very slow heartbeat.  You feel confused.  You are very sleepy.  You pass out (faint).  You feel sick to your stomach.  You throw up.  You have cold skin.  You have blue lips or fingers.  Your muscles are weak (limp) and your body seems floppy.  The black centers of your eyes (pupils) are smaller than normal.  You  feel like you may hurt yourself or others. If you think that you (or somebody else) may have taken too much of an opioid pain medicine, go to your nearest emergency department or call:  Your local emergency services (911 in the [place]  The hotline of the [facility] 858-139-4341 in the [place]  A suicide crisis helpline, such as the [facility] at 7438491383. This is open 24 hours a day. Summary  Opioid pain medicines are strong medicines that can have serious side effects if you take them for too long.  If you have pain, work with your doctor to make a plan to treat it. If you can, find other options that help you manage pain.  Get help right away if you think that you (or somebody else) may have taken too much of an opioid pain medicine. This information is not intended to replace advice given to you by your health care provider. Make sure you discuss any questions you have with your health care provider. Document Released: 10/28/2016 Document Revised: 09/30/2017 Document Reviewed: 10/28/2016 Elsevier Interactive Patient Education  2019 [employer].
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
## 2018-06-10 ENCOUNTER — Encounter: Payer: Self-pay | Admitting: Internal Medicine

## 2018-06-10 DIAGNOSIS — N2581 Secondary hyperparathyroidism of renal origin: Secondary | ICD-10-CM | POA: Diagnosis not present

## 2018-06-10 DIAGNOSIS — N186 End stage renal disease: Secondary | ICD-10-CM | POA: Diagnosis not present

## 2018-06-10 DIAGNOSIS — K4090 Unilateral inguinal hernia, without obstruction or gangrene, not specified as recurrent: Secondary | ICD-10-CM | POA: Insufficient documentation

## 2018-06-10 NOTE — Assessment & Plan Note (Signed)
Not on antihypertensives since starting HD. BP mildly elevated today but patient visibly upset and in pain.  Plan: -continue monitoring BP, may warrant restarting antihypertensives if continues to be elevated

## 2018-06-10 NOTE — Progress Notes (Signed)
Internal Medicine Clinic Attending  Case discussed with Dr. Svalina  at the time of the visit.  We reviewed the resident's history and exam and pertinent patient test results.  I agree with the assessment, diagnosis, and plan of care documented in the resident's note.  

## 2018-06-10 NOTE — Assessment & Plan Note (Signed)
Patient having symptoms for past month; CT at [facility] revealed what looks like a right sided indirect inguinal hernia containing only ascitic fluid. Pain not managed with tramadol. No signs of incarcerated bowel on exam today, she has stable symptoms and no fever/vomiting.  Plan: -stop tramadol -start oxy ir 2.5mg  TID PRN x7d to last until she sees surgery again on 5/27; we discussed and she understood side effects of opioid therapy and precautions she should take

## 2018-06-10 NOTE — Assessment & Plan Note (Signed)
Compliant with protonix. Has not been able to see GI for repeat EGD due to COVID and multiple ongoing medical problems.  Plan: -advised her to see GI for EGD after stabilization of her inguinal hernia -continue protonix daily

## 2018-06-10 NOTE — Assessment & Plan Note (Signed)
Abd pain and nausea improved after transitioning from HD to PD.   Plan: -continue phenergan prn -stop tramadol

## 2018-06-10 NOTE — Assessment & Plan Note (Signed)
Patient having a difficult time with transition to PD due to discomfort and having to navigate the intricacies of PD by herself. Having trouble sleeping due to discomfort of dialysate presence. She has addressed this with nephrology.  She has not had [facility] appt as she feels overwhelmed with appointments and phone calls right now. I encouraged her to reach out to our [facility] specialist and reach out to her friend who underwent PD training with her for assistance with managing PD at least for the time being.

## 2018-06-11 DIAGNOSIS — N2581 Secondary hyperparathyroidism of renal origin: Secondary | ICD-10-CM | POA: Diagnosis not present

## 2018-06-11 DIAGNOSIS — N186 End stage renal disease: Secondary | ICD-10-CM | POA: Diagnosis not present

## 2018-06-12 DIAGNOSIS — N2581 Secondary hyperparathyroidism of renal origin: Secondary | ICD-10-CM | POA: Diagnosis not present

## 2018-06-12 DIAGNOSIS — N186 End stage renal disease: Secondary | ICD-10-CM | POA: Diagnosis not present

## 2018-06-13 DIAGNOSIS — N186 End stage renal disease: Secondary | ICD-10-CM | POA: Diagnosis not present

## 2018-06-13 DIAGNOSIS — N2581 Secondary hyperparathyroidism of renal origin: Secondary | ICD-10-CM | POA: Diagnosis not present

## 2018-06-13 LAB — TOXASSURE SELECT,+ANTIDEPR,UR

## 2018-06-14 DIAGNOSIS — N186 End stage renal disease: Secondary | ICD-10-CM | POA: Diagnosis not present

## 2018-06-14 DIAGNOSIS — N2581 Secondary hyperparathyroidism of renal origin: Secondary | ICD-10-CM | POA: Diagnosis not present

## 2018-06-15 DIAGNOSIS — N2581 Secondary hyperparathyroidism of renal origin: Secondary | ICD-10-CM | POA: Diagnosis not present

## 2018-06-15 DIAGNOSIS — N186 End stage renal disease: Secondary | ICD-10-CM | POA: Diagnosis not present

## 2018-06-16 DIAGNOSIS — N185 Chronic kidney disease, stage 5: Secondary | ICD-10-CM | POA: Diagnosis not present

## 2018-06-16 DIAGNOSIS — N186 End stage renal disease: Secondary | ICD-10-CM | POA: Diagnosis not present

## 2018-06-16 DIAGNOSIS — Q613 Polycystic kidney, unspecified: Secondary | ICD-10-CM | POA: Diagnosis not present

## 2018-06-16 DIAGNOSIS — K4090 Unilateral inguinal hernia, without obstruction or gangrene, not specified as recurrent: Secondary | ICD-10-CM | POA: Diagnosis not present

## 2018-06-16 DIAGNOSIS — N2581 Secondary hyperparathyroidism of renal origin: Secondary | ICD-10-CM | POA: Diagnosis not present

## 2018-06-17 DIAGNOSIS — N186 End stage renal disease: Secondary | ICD-10-CM | POA: Diagnosis not present

## 2018-06-17 DIAGNOSIS — Z1159 Encounter for screening for other viral diseases: Secondary | ICD-10-CM | POA: Diagnosis not present

## 2018-06-17 DIAGNOSIS — Z01818 Encounter for other preprocedural examination: Secondary | ICD-10-CM | POA: Diagnosis not present

## 2018-06-17 DIAGNOSIS — K4090 Unilateral inguinal hernia, without obstruction or gangrene, not specified as recurrent: Secondary | ICD-10-CM | POA: Diagnosis not present

## 2018-06-17 DIAGNOSIS — Z01812 Encounter for preprocedural laboratory examination: Secondary | ICD-10-CM | POA: Diagnosis not present

## 2018-06-17 DIAGNOSIS — N2581 Secondary hyperparathyroidism of renal origin: Secondary | ICD-10-CM | POA: Diagnosis not present

## 2018-06-18 DIAGNOSIS — N2581 Secondary hyperparathyroidism of renal origin: Secondary | ICD-10-CM | POA: Diagnosis not present

## 2018-06-18 DIAGNOSIS — N186 End stage renal disease: Secondary | ICD-10-CM | POA: Diagnosis not present

## 2018-06-19 DIAGNOSIS — N186 End stage renal disease: Secondary | ICD-10-CM | POA: Diagnosis not present

## 2018-06-19 DIAGNOSIS — N2581 Secondary hyperparathyroidism of renal origin: Secondary | ICD-10-CM | POA: Diagnosis not present

## 2018-06-20 ENCOUNTER — Encounter: Payer: Self-pay | Admitting: *Deleted

## 2018-06-20 DIAGNOSIS — Z992 Dependence on renal dialysis: Secondary | ICD-10-CM | POA: Diagnosis not present

## 2018-06-20 DIAGNOSIS — N186 End stage renal disease: Secondary | ICD-10-CM | POA: Diagnosis not present

## 2018-06-20 DIAGNOSIS — N2581 Secondary hyperparathyroidism of renal origin: Secondary | ICD-10-CM | POA: Diagnosis not present

## 2018-06-20 DIAGNOSIS — I129 Hypertensive chronic kidney disease with stage 1 through stage 4 chronic kidney disease, or unspecified chronic kidney disease: Secondary | ICD-10-CM | POA: Diagnosis not present

## 2018-06-21 DIAGNOSIS — N2581 Secondary hyperparathyroidism of renal origin: Secondary | ICD-10-CM | POA: Diagnosis not present

## 2018-06-21 DIAGNOSIS — N186 End stage renal disease: Secondary | ICD-10-CM | POA: Diagnosis not present

## 2018-06-21 DIAGNOSIS — Z23 Encounter for immunization: Secondary | ICD-10-CM | POA: Diagnosis not present

## 2018-06-22 DIAGNOSIS — N186 End stage renal disease: Secondary | ICD-10-CM | POA: Diagnosis not present

## 2018-06-22 DIAGNOSIS — Z23 Encounter for immunization: Secondary | ICD-10-CM | POA: Diagnosis not present

## 2018-06-22 DIAGNOSIS — N2581 Secondary hyperparathyroidism of renal origin: Secondary | ICD-10-CM | POA: Diagnosis not present

## 2018-06-23 DIAGNOSIS — N2581 Secondary hyperparathyroidism of renal origin: Secondary | ICD-10-CM | POA: Diagnosis not present

## 2018-06-23 DIAGNOSIS — Z23 Encounter for immunization: Secondary | ICD-10-CM | POA: Diagnosis not present

## 2018-06-23 DIAGNOSIS — N186 End stage renal disease: Secondary | ICD-10-CM | POA: Diagnosis not present

## 2018-06-24 DIAGNOSIS — G4730 Sleep apnea, unspecified: Secondary | ICD-10-CM | POA: Diagnosis not present

## 2018-06-24 DIAGNOSIS — N2581 Secondary hyperparathyroidism of renal origin: Secondary | ICD-10-CM | POA: Diagnosis not present

## 2018-06-24 DIAGNOSIS — Z23 Encounter for immunization: Secondary | ICD-10-CM | POA: Diagnosis not present

## 2018-06-24 DIAGNOSIS — N185 Chronic kidney disease, stage 5: Secondary | ICD-10-CM | POA: Diagnosis not present

## 2018-06-24 DIAGNOSIS — Z87891 Personal history of nicotine dependence: Secondary | ICD-10-CM | POA: Diagnosis not present

## 2018-06-24 DIAGNOSIS — Z992 Dependence on renal dialysis: Secondary | ICD-10-CM | POA: Diagnosis not present

## 2018-06-24 DIAGNOSIS — K4090 Unilateral inguinal hernia, without obstruction or gangrene, not specified as recurrent: Secondary | ICD-10-CM | POA: Diagnosis not present

## 2018-06-24 DIAGNOSIS — N186 End stage renal disease: Secondary | ICD-10-CM | POA: Diagnosis not present

## 2018-06-24 HISTORY — PX: ROBOT ASSISTED INGUINAL HERNIA REPAIR: SHX6561

## 2018-06-25 DIAGNOSIS — Z23 Encounter for immunization: Secondary | ICD-10-CM | POA: Diagnosis not present

## 2018-06-25 DIAGNOSIS — N2581 Secondary hyperparathyroidism of renal origin: Secondary | ICD-10-CM | POA: Diagnosis not present

## 2018-06-25 DIAGNOSIS — N186 End stage renal disease: Secondary | ICD-10-CM | POA: Diagnosis not present

## 2018-06-26 DIAGNOSIS — Z23 Encounter for immunization: Secondary | ICD-10-CM | POA: Diagnosis not present

## 2018-06-26 DIAGNOSIS — N2581 Secondary hyperparathyroidism of renal origin: Secondary | ICD-10-CM | POA: Diagnosis not present

## 2018-06-26 DIAGNOSIS — N186 End stage renal disease: Secondary | ICD-10-CM | POA: Diagnosis not present

## 2018-06-27 DIAGNOSIS — N186 End stage renal disease: Secondary | ICD-10-CM | POA: Diagnosis not present

## 2018-06-27 DIAGNOSIS — N2581 Secondary hyperparathyroidism of renal origin: Secondary | ICD-10-CM | POA: Diagnosis not present

## 2018-06-28 DIAGNOSIS — N2581 Secondary hyperparathyroidism of renal origin: Secondary | ICD-10-CM | POA: Diagnosis not present

## 2018-06-28 DIAGNOSIS — N186 End stage renal disease: Secondary | ICD-10-CM | POA: Diagnosis not present

## 2018-06-29 DIAGNOSIS — N2581 Secondary hyperparathyroidism of renal origin: Secondary | ICD-10-CM | POA: Diagnosis not present

## 2018-06-29 DIAGNOSIS — N186 End stage renal disease: Secondary | ICD-10-CM | POA: Diagnosis not present

## 2018-06-30 DIAGNOSIS — N186 End stage renal disease: Secondary | ICD-10-CM | POA: Diagnosis not present

## 2018-06-30 DIAGNOSIS — N2581 Secondary hyperparathyroidism of renal origin: Secondary | ICD-10-CM | POA: Diagnosis not present

## 2018-07-01 ENCOUNTER — Encounter (HOSPITAL_COMMUNITY): Payer: Self-pay | Admitting: Emergency Medicine

## 2018-07-01 ENCOUNTER — Other Ambulatory Visit: Payer: Self-pay

## 2018-07-01 ENCOUNTER — Emergency Department (HOSPITAL_COMMUNITY): Payer: BC Managed Care – PPO

## 2018-07-01 ENCOUNTER — Emergency Department (HOSPITAL_COMMUNITY)
Admission: EM | Admit: 2018-07-01 | Discharge: 2018-07-02 | Disposition: A | Discharge status: Home / Self Care | Payer: BC Managed Care – PPO | Attending: Emergency Medicine | Admitting: Emergency Medicine

## 2018-07-01 DIAGNOSIS — R109 Unspecified abdominal pain: Secondary | ICD-10-CM | POA: Diagnosis not present

## 2018-07-01 DIAGNOSIS — R600 Localized edema: Secondary | ICD-10-CM | POA: Diagnosis not present

## 2018-07-01 DIAGNOSIS — Q613 Polycystic kidney, unspecified: Secondary | ICD-10-CM | POA: Diagnosis not present

## 2018-07-01 DIAGNOSIS — Z87891 Personal history of nicotine dependence: Secondary | ICD-10-CM | POA: Insufficient documentation

## 2018-07-01 DIAGNOSIS — K5730 Diverticulosis of large intestine without perforation or abscess without bleeding: Secondary | ICD-10-CM | POA: Diagnosis not present

## 2018-07-01 DIAGNOSIS — R1900 Intra-abdominal and pelvic swelling, mass and lump, unspecified site: Secondary | ICD-10-CM | POA: Diagnosis not present

## 2018-07-01 DIAGNOSIS — Z79899 Other long term (current) drug therapy: Secondary | ICD-10-CM | POA: Diagnosis not present

## 2018-07-01 DIAGNOSIS — Z992 Dependence on renal dialysis: Secondary | ICD-10-CM | POA: Diagnosis not present

## 2018-07-01 DIAGNOSIS — N2581 Secondary hyperparathyroidism of renal origin: Secondary | ICD-10-CM | POA: Diagnosis not present

## 2018-07-01 DIAGNOSIS — I120 Hypertensive chronic kidney disease with stage 5 chronic kidney disease or end stage renal disease: Secondary | ICD-10-CM | POA: Insufficient documentation

## 2018-07-01 DIAGNOSIS — N186 End stage renal disease: Secondary | ICD-10-CM | POA: Insufficient documentation

## 2018-07-01 LAB — CBC WITH DIFFERENTIAL/PLATELET
Abs Immature Granulocytes: 0.02 10*3/uL (ref 0.00–0.07)
Basophils Absolute: 0.1 10*3/uL (ref 0.0–0.1)
Basophils Relative: 1 %
Eosinophils Absolute: 0.5 10*3/uL (ref 0.0–0.5)
Eosinophils Relative: 7 %
HCT: 34 % — ABNORMAL LOW (ref 36.0–46.0)
Hemoglobin: 10.7 g/dL — ABNORMAL LOW (ref 12.0–15.0)
Immature Granulocytes: 0 %
Lymphocytes Relative: 29 %
Lymphs Abs: 2.1 10*3/uL (ref 0.7–4.0)
MCH: 32.4 pg (ref 26.0–34.0)
MCHC: 31.5 g/dL (ref 30.0–36.0)
MCV: 103 fL — ABNORMAL HIGH (ref 80.0–100.0)
Monocytes Absolute: 0.5 10*3/uL (ref 0.1–1.0)
Monocytes Relative: 7 %
Neutro Abs: 4.1 10*3/uL (ref 1.7–7.7)
Neutrophils Relative %: 56 %
Platelets: 245 10*3/uL (ref 150–400)
RBC: 3.3 MIL/uL — ABNORMAL LOW (ref 3.87–5.11)
RDW: 15.2 % (ref 11.5–15.5)
WBC: 7.3 10*3/uL (ref 4.0–10.5)
nRBC: 0 % (ref 0.0–0.2)

## 2018-07-01 LAB — COMPREHENSIVE METABOLIC PANEL
ALT: 7 U/L (ref 0–44)
AST: 13 U/L — ABNORMAL LOW (ref 15–41)
Albumin: 3 g/dL — ABNORMAL LOW (ref 3.5–5.0)
Alkaline Phosphatase: 34 U/L — ABNORMAL LOW (ref 38–126)
Anion gap: 9 (ref 5–15)
BUN: 39 mg/dL — ABNORMAL HIGH (ref 6–20)
CO2: 26 mmol/L (ref 22–32)
Calcium: 8.9 mg/dL (ref 8.9–10.3)
Chloride: 104 mmol/L (ref 98–111)
Creatinine, Ser: 9.8 mg/dL — ABNORMAL HIGH (ref 0.44–1.00)
GFR calc Af Amer: 5 mL/min — ABNORMAL LOW (ref >60)
GFR calc non Af Amer: 4 mL/min — ABNORMAL LOW (ref >60)
Glucose, Bld: 118 mg/dL — ABNORMAL HIGH (ref 70–99)
Potassium: 3.6 mmol/L (ref 3.5–5.1)
Sodium: 139 mmol/L (ref 135–145)
Total Bilirubin: 0.4 mg/dL (ref 0.3–1.2)
Total Protein: 6.5 g/dL (ref 6.5–8.1)

## 2018-07-01 NOTE — ED Notes (Signed)
Patient transported to CT 
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
## 2018-07-01 NOTE — ED Triage Notes (Signed)
Pt states she plugged herself up to her abd dialysis at home last night and an alarm sounded. She has had this happen before, and has had to stop and take laxatives. So pt tried this and manually gave herself dialysis today. Pt states she was only able to remove a small amount- contacted her dialysis RN. Pt went to have her dialysis line assessed/changed out and was sent home today to try dialysis again. Pt states when she got home, her lower abd was completely swollen and she was told to come here. Pt states it now seems less swollen than it originally was.

## 2018-07-01 NOTE — ED Notes (Signed)
Pt updated on care, awaiting labs for CT

## 2018-07-01 NOTE — Discharge Instructions (Addendum)
Please follow-up closely with your kidney doctor and your surgeon regarding your visit today. If you develop worsening swelling with some redness, fever, or drainage from your wounds, please report back to the emergency department.

## 2018-07-02 DIAGNOSIS — Q613 Polycystic kidney, unspecified: Secondary | ICD-10-CM | POA: Diagnosis not present

## 2018-07-02 DIAGNOSIS — R1031 Right lower quadrant pain: Secondary | ICD-10-CM | POA: Diagnosis not present

## 2018-07-02 DIAGNOSIS — N186 End stage renal disease: Secondary | ICD-10-CM | POA: Diagnosis not present

## 2018-07-02 DIAGNOSIS — N2581 Secondary hyperparathyroidism of renal origin: Secondary | ICD-10-CM | POA: Diagnosis not present

## 2018-07-02 DIAGNOSIS — N185 Chronic kidney disease, stage 5: Secondary | ICD-10-CM | POA: Diagnosis not present

## 2018-07-02 DIAGNOSIS — K4090 Unilateral inguinal hernia, without obstruction or gangrene, not specified as recurrent: Secondary | ICD-10-CM | POA: Diagnosis not present

## 2018-07-02 NOTE — ED Notes (Signed)
Patient verbalizes understanding of discharge instructions. Opportunity for questioning and answers were provided. Armband removed by staff, pt discharged from ED by wheelchair to drive self home

## 2018-07-03 DIAGNOSIS — N2581 Secondary hyperparathyroidism of renal origin: Secondary | ICD-10-CM | POA: Diagnosis not present

## 2018-07-03 DIAGNOSIS — R140 Abdominal distension (gaseous): Secondary | ICD-10-CM | POA: Diagnosis not present

## 2018-07-03 DIAGNOSIS — N186 End stage renal disease: Secondary | ICD-10-CM | POA: Diagnosis not present

## 2018-07-04 DIAGNOSIS — N186 End stage renal disease: Secondary | ICD-10-CM | POA: Diagnosis not present

## 2018-07-04 DIAGNOSIS — N2581 Secondary hyperparathyroidism of renal origin: Secondary | ICD-10-CM | POA: Diagnosis not present

## 2018-07-05 DIAGNOSIS — N2581 Secondary hyperparathyroidism of renal origin: Secondary | ICD-10-CM | POA: Diagnosis not present

## 2018-07-05 DIAGNOSIS — N186 End stage renal disease: Secondary | ICD-10-CM | POA: Diagnosis not present

## 2018-07-06 DIAGNOSIS — N2581 Secondary hyperparathyroidism of renal origin: Secondary | ICD-10-CM | POA: Diagnosis not present

## 2018-07-06 DIAGNOSIS — E8779 Other fluid overload: Secondary | ICD-10-CM | POA: Diagnosis not present

## 2018-07-06 DIAGNOSIS — N186 End stage renal disease: Secondary | ICD-10-CM | POA: Diagnosis not present

## 2018-07-07 DIAGNOSIS — R1031 Right lower quadrant pain: Secondary | ICD-10-CM | POA: Diagnosis not present

## 2018-07-07 DIAGNOSIS — N2581 Secondary hyperparathyroidism of renal origin: Secondary | ICD-10-CM | POA: Diagnosis not present

## 2018-07-07 DIAGNOSIS — Z4902 Encounter for fitting and adjustment of peritoneal dialysis catheter: Secondary | ICD-10-CM | POA: Diagnosis not present

## 2018-07-07 DIAGNOSIS — K4090 Unilateral inguinal hernia, without obstruction or gangrene, not specified as recurrent: Secondary | ICD-10-CM | POA: Diagnosis not present

## 2018-07-07 DIAGNOSIS — N186 End stage renal disease: Secondary | ICD-10-CM | POA: Diagnosis not present

## 2018-07-07 DIAGNOSIS — N185 Chronic kidney disease, stage 5: Secondary | ICD-10-CM | POA: Diagnosis not present

## 2018-07-08 DIAGNOSIS — N186 End stage renal disease: Secondary | ICD-10-CM | POA: Diagnosis not present

## 2018-07-08 DIAGNOSIS — N2581 Secondary hyperparathyroidism of renal origin: Secondary | ICD-10-CM | POA: Diagnosis not present

## 2018-07-09 DIAGNOSIS — N186 End stage renal disease: Secondary | ICD-10-CM | POA: Diagnosis not present

## 2018-07-09 DIAGNOSIS — N2581 Secondary hyperparathyroidism of renal origin: Secondary | ICD-10-CM | POA: Diagnosis not present

## 2018-07-10 DIAGNOSIS — N2581 Secondary hyperparathyroidism of renal origin: Secondary | ICD-10-CM | POA: Diagnosis not present

## 2018-07-10 DIAGNOSIS — N186 End stage renal disease: Secondary | ICD-10-CM | POA: Diagnosis not present

## 2018-07-11 DIAGNOSIS — N2581 Secondary hyperparathyroidism of renal origin: Secondary | ICD-10-CM | POA: Diagnosis not present

## 2018-07-11 DIAGNOSIS — N186 End stage renal disease: Secondary | ICD-10-CM | POA: Diagnosis not present

## 2018-07-12 DIAGNOSIS — N2581 Secondary hyperparathyroidism of renal origin: Secondary | ICD-10-CM | POA: Diagnosis not present

## 2018-07-12 DIAGNOSIS — N186 End stage renal disease: Secondary | ICD-10-CM | POA: Diagnosis not present

## 2018-07-13 DIAGNOSIS — N186 End stage renal disease: Secondary | ICD-10-CM | POA: Diagnosis not present

## 2018-07-13 DIAGNOSIS — N2581 Secondary hyperparathyroidism of renal origin: Secondary | ICD-10-CM | POA: Diagnosis not present

## 2018-07-14 DIAGNOSIS — N2581 Secondary hyperparathyroidism of renal origin: Secondary | ICD-10-CM | POA: Diagnosis not present

## 2018-07-14 DIAGNOSIS — N186 End stage renal disease: Secondary | ICD-10-CM | POA: Diagnosis not present

## 2018-07-15 ENCOUNTER — Other Ambulatory Visit: Payer: Self-pay | Admitting: Internal Medicine

## 2018-07-15 DIAGNOSIS — N2581 Secondary hyperparathyroidism of renal origin: Secondary | ICD-10-CM | POA: Diagnosis not present

## 2018-07-15 DIAGNOSIS — Z1231 Encounter for screening mammogram for malignant neoplasm of breast: Secondary | ICD-10-CM

## 2018-07-15 DIAGNOSIS — N186 End stage renal disease: Secondary | ICD-10-CM | POA: Diagnosis not present

## 2018-07-16 DIAGNOSIS — N186 End stage renal disease: Secondary | ICD-10-CM | POA: Diagnosis not present

## 2018-07-16 DIAGNOSIS — N2581 Secondary hyperparathyroidism of renal origin: Secondary | ICD-10-CM | POA: Diagnosis not present

## 2018-07-17 DIAGNOSIS — N2581 Secondary hyperparathyroidism of renal origin: Secondary | ICD-10-CM | POA: Diagnosis not present

## 2018-07-17 DIAGNOSIS — N186 End stage renal disease: Secondary | ICD-10-CM | POA: Diagnosis not present

## 2018-07-18 DIAGNOSIS — N2581 Secondary hyperparathyroidism of renal origin: Secondary | ICD-10-CM | POA: Diagnosis not present

## 2018-07-18 DIAGNOSIS — N186 End stage renal disease: Secondary | ICD-10-CM | POA: Diagnosis not present

## 2018-07-19 DIAGNOSIS — N2581 Secondary hyperparathyroidism of renal origin: Secondary | ICD-10-CM | POA: Diagnosis not present

## 2018-07-19 DIAGNOSIS — N186 End stage renal disease: Secondary | ICD-10-CM | POA: Diagnosis not present

## 2018-07-20 DIAGNOSIS — N2581 Secondary hyperparathyroidism of renal origin: Secondary | ICD-10-CM | POA: Diagnosis not present

## 2018-07-20 DIAGNOSIS — Z992 Dependence on renal dialysis: Secondary | ICD-10-CM | POA: Diagnosis not present

## 2018-07-20 DIAGNOSIS — I129 Hypertensive chronic kidney disease with stage 1 through stage 4 chronic kidney disease, or unspecified chronic kidney disease: Secondary | ICD-10-CM | POA: Diagnosis not present

## 2018-07-20 DIAGNOSIS — N186 End stage renal disease: Secondary | ICD-10-CM | POA: Diagnosis not present

## 2018-07-21 DIAGNOSIS — N2581 Secondary hyperparathyroidism of renal origin: Secondary | ICD-10-CM | POA: Diagnosis not present

## 2018-07-21 DIAGNOSIS — N186 End stage renal disease: Secondary | ICD-10-CM | POA: Diagnosis not present

## 2018-07-22 DIAGNOSIS — N186 End stage renal disease: Secondary | ICD-10-CM | POA: Diagnosis not present

## 2018-07-22 DIAGNOSIS — N2581 Secondary hyperparathyroidism of renal origin: Secondary | ICD-10-CM | POA: Diagnosis not present

## 2018-07-23 DIAGNOSIS — N186 End stage renal disease: Secondary | ICD-10-CM | POA: Diagnosis not present

## 2018-07-23 DIAGNOSIS — N2581 Secondary hyperparathyroidism of renal origin: Secondary | ICD-10-CM | POA: Diagnosis not present

## 2018-07-24 DIAGNOSIS — N186 End stage renal disease: Secondary | ICD-10-CM | POA: Diagnosis not present

## 2018-07-24 DIAGNOSIS — N2581 Secondary hyperparathyroidism of renal origin: Secondary | ICD-10-CM | POA: Diagnosis not present

## 2018-07-25 DIAGNOSIS — N2581 Secondary hyperparathyroidism of renal origin: Secondary | ICD-10-CM | POA: Diagnosis not present

## 2018-07-25 DIAGNOSIS — N186 End stage renal disease: Secondary | ICD-10-CM | POA: Diagnosis not present

## 2018-07-26 DIAGNOSIS — N2581 Secondary hyperparathyroidism of renal origin: Secondary | ICD-10-CM | POA: Diagnosis not present

## 2018-07-26 DIAGNOSIS — N186 End stage renal disease: Secondary | ICD-10-CM | POA: Diagnosis not present

## 2018-07-27 DIAGNOSIS — N2581 Secondary hyperparathyroidism of renal origin: Secondary | ICD-10-CM | POA: Diagnosis not present

## 2018-07-27 DIAGNOSIS — N186 End stage renal disease: Secondary | ICD-10-CM | POA: Diagnosis not present

## 2018-07-28 DIAGNOSIS — N2581 Secondary hyperparathyroidism of renal origin: Secondary | ICD-10-CM | POA: Diagnosis not present

## 2018-07-28 DIAGNOSIS — N186 End stage renal disease: Secondary | ICD-10-CM | POA: Diagnosis not present

## 2018-07-29 DIAGNOSIS — N2581 Secondary hyperparathyroidism of renal origin: Secondary | ICD-10-CM | POA: Diagnosis not present

## 2018-07-29 DIAGNOSIS — N186 End stage renal disease: Secondary | ICD-10-CM | POA: Diagnosis not present

## 2018-07-30 DIAGNOSIS — N186 End stage renal disease: Secondary | ICD-10-CM | POA: Diagnosis not present

## 2018-07-30 DIAGNOSIS — N2581 Secondary hyperparathyroidism of renal origin: Secondary | ICD-10-CM | POA: Diagnosis not present

## 2018-07-31 DIAGNOSIS — N186 End stage renal disease: Secondary | ICD-10-CM | POA: Diagnosis not present

## 2018-07-31 DIAGNOSIS — N2581 Secondary hyperparathyroidism of renal origin: Secondary | ICD-10-CM | POA: Diagnosis not present

## 2018-08-01 DIAGNOSIS — N2581 Secondary hyperparathyroidism of renal origin: Secondary | ICD-10-CM | POA: Diagnosis not present

## 2018-08-01 DIAGNOSIS — N186 End stage renal disease: Secondary | ICD-10-CM | POA: Diagnosis not present

## 2018-08-02 DIAGNOSIS — N186 End stage renal disease: Secondary | ICD-10-CM | POA: Diagnosis not present

## 2018-08-02 DIAGNOSIS — N2581 Secondary hyperparathyroidism of renal origin: Secondary | ICD-10-CM | POA: Diagnosis not present

## 2018-08-03 DIAGNOSIS — N186 End stage renal disease: Secondary | ICD-10-CM | POA: Diagnosis not present

## 2018-08-03 DIAGNOSIS — N2581 Secondary hyperparathyroidism of renal origin: Secondary | ICD-10-CM | POA: Diagnosis not present

## 2018-08-04 DIAGNOSIS — N2581 Secondary hyperparathyroidism of renal origin: Secondary | ICD-10-CM | POA: Diagnosis not present

## 2018-08-04 DIAGNOSIS — N186 End stage renal disease: Secondary | ICD-10-CM | POA: Diagnosis not present

## 2018-08-05 DIAGNOSIS — N186 End stage renal disease: Secondary | ICD-10-CM | POA: Diagnosis not present

## 2018-08-05 DIAGNOSIS — N2581 Secondary hyperparathyroidism of renal origin: Secondary | ICD-10-CM | POA: Diagnosis not present

## 2018-08-06 DIAGNOSIS — N186 End stage renal disease: Secondary | ICD-10-CM | POA: Diagnosis not present

## 2018-08-06 DIAGNOSIS — N2581 Secondary hyperparathyroidism of renal origin: Secondary | ICD-10-CM | POA: Diagnosis not present

## 2018-08-07 DIAGNOSIS — N186 End stage renal disease: Secondary | ICD-10-CM | POA: Diagnosis not present

## 2018-08-07 DIAGNOSIS — N2581 Secondary hyperparathyroidism of renal origin: Secondary | ICD-10-CM | POA: Diagnosis not present

## 2018-08-08 DIAGNOSIS — N2581 Secondary hyperparathyroidism of renal origin: Secondary | ICD-10-CM | POA: Diagnosis not present

## 2018-08-08 DIAGNOSIS — N186 End stage renal disease: Secondary | ICD-10-CM | POA: Diagnosis not present

## 2018-08-09 DIAGNOSIS — N2581 Secondary hyperparathyroidism of renal origin: Secondary | ICD-10-CM | POA: Diagnosis not present

## 2018-08-09 DIAGNOSIS — N186 End stage renal disease: Secondary | ICD-10-CM | POA: Diagnosis not present

## 2018-08-10 DIAGNOSIS — N2581 Secondary hyperparathyroidism of renal origin: Secondary | ICD-10-CM | POA: Diagnosis not present

## 2018-08-10 DIAGNOSIS — N186 End stage renal disease: Secondary | ICD-10-CM | POA: Diagnosis not present

## 2018-08-11 DIAGNOSIS — N2581 Secondary hyperparathyroidism of renal origin: Secondary | ICD-10-CM | POA: Diagnosis not present

## 2018-08-11 DIAGNOSIS — N186 End stage renal disease: Secondary | ICD-10-CM | POA: Diagnosis not present

## 2018-08-12 DIAGNOSIS — N2581 Secondary hyperparathyroidism of renal origin: Secondary | ICD-10-CM | POA: Diagnosis not present

## 2018-08-12 DIAGNOSIS — E440 Moderate protein-calorie malnutrition: Secondary | ICD-10-CM | POA: Insufficient documentation

## 2018-08-12 DIAGNOSIS — N186 End stage renal disease: Secondary | ICD-10-CM | POA: Diagnosis not present

## 2018-08-13 DIAGNOSIS — N186 End stage renal disease: Secondary | ICD-10-CM | POA: Diagnosis not present

## 2018-08-13 DIAGNOSIS — N2581 Secondary hyperparathyroidism of renal origin: Secondary | ICD-10-CM | POA: Diagnosis not present

## 2018-08-14 DIAGNOSIS — N186 End stage renal disease: Secondary | ICD-10-CM | POA: Diagnosis not present

## 2018-08-14 DIAGNOSIS — N2581 Secondary hyperparathyroidism of renal origin: Secondary | ICD-10-CM | POA: Diagnosis not present

## 2018-08-15 DIAGNOSIS — N2581 Secondary hyperparathyroidism of renal origin: Secondary | ICD-10-CM | POA: Diagnosis not present

## 2018-08-15 DIAGNOSIS — N186 End stage renal disease: Secondary | ICD-10-CM | POA: Diagnosis not present

## 2018-08-16 DIAGNOSIS — R1012 Left upper quadrant pain: Secondary | ICD-10-CM | POA: Insufficient documentation

## 2018-08-16 DIAGNOSIS — N2581 Secondary hyperparathyroidism of renal origin: Secondary | ICD-10-CM | POA: Diagnosis not present

## 2018-08-16 DIAGNOSIS — N186 End stage renal disease: Secondary | ICD-10-CM | POA: Diagnosis not present

## 2018-08-17 DIAGNOSIS — N2581 Secondary hyperparathyroidism of renal origin: Secondary | ICD-10-CM | POA: Diagnosis not present

## 2018-08-17 DIAGNOSIS — N186 End stage renal disease: Secondary | ICD-10-CM | POA: Diagnosis not present

## 2018-08-18 DIAGNOSIS — N186 End stage renal disease: Secondary | ICD-10-CM | POA: Diagnosis not present

## 2018-08-18 DIAGNOSIS — N2581 Secondary hyperparathyroidism of renal origin: Secondary | ICD-10-CM | POA: Diagnosis not present

## 2018-08-19 DIAGNOSIS — N186 End stage renal disease: Secondary | ICD-10-CM | POA: Diagnosis not present

## 2018-08-19 DIAGNOSIS — N2581 Secondary hyperparathyroidism of renal origin: Secondary | ICD-10-CM | POA: Diagnosis not present

## 2018-08-20 DIAGNOSIS — I129 Hypertensive chronic kidney disease with stage 1 through stage 4 chronic kidney disease, or unspecified chronic kidney disease: Secondary | ICD-10-CM | POA: Diagnosis not present

## 2018-08-20 DIAGNOSIS — N2581 Secondary hyperparathyroidism of renal origin: Secondary | ICD-10-CM | POA: Diagnosis not present

## 2018-08-20 DIAGNOSIS — N186 End stage renal disease: Secondary | ICD-10-CM | POA: Diagnosis not present

## 2018-08-20 DIAGNOSIS — Z992 Dependence on renal dialysis: Secondary | ICD-10-CM | POA: Diagnosis not present

## 2018-08-21 DIAGNOSIS — Z992 Dependence on renal dialysis: Secondary | ICD-10-CM | POA: Diagnosis not present

## 2018-08-21 DIAGNOSIS — N2581 Secondary hyperparathyroidism of renal origin: Secondary | ICD-10-CM | POA: Diagnosis not present

## 2018-08-21 DIAGNOSIS — N186 End stage renal disease: Secondary | ICD-10-CM | POA: Diagnosis not present

## 2018-08-22 DIAGNOSIS — Z992 Dependence on renal dialysis: Secondary | ICD-10-CM | POA: Diagnosis not present

## 2018-08-22 DIAGNOSIS — N186 End stage renal disease: Secondary | ICD-10-CM | POA: Diagnosis not present

## 2018-08-22 DIAGNOSIS — N2581 Secondary hyperparathyroidism of renal origin: Secondary | ICD-10-CM | POA: Diagnosis not present

## 2018-08-23 DIAGNOSIS — N186 End stage renal disease: Secondary | ICD-10-CM | POA: Diagnosis not present

## 2018-08-23 DIAGNOSIS — N2581 Secondary hyperparathyroidism of renal origin: Secondary | ICD-10-CM | POA: Diagnosis not present

## 2018-08-23 DIAGNOSIS — Z992 Dependence on renal dialysis: Secondary | ICD-10-CM | POA: Diagnosis not present

## 2018-08-24 DIAGNOSIS — N2581 Secondary hyperparathyroidism of renal origin: Secondary | ICD-10-CM | POA: Diagnosis not present

## 2018-08-24 DIAGNOSIS — Z992 Dependence on renal dialysis: Secondary | ICD-10-CM | POA: Diagnosis not present

## 2018-08-24 DIAGNOSIS — N186 End stage renal disease: Secondary | ICD-10-CM | POA: Diagnosis not present

## 2018-08-25 DIAGNOSIS — N186 End stage renal disease: Secondary | ICD-10-CM | POA: Diagnosis not present

## 2018-08-25 DIAGNOSIS — N2581 Secondary hyperparathyroidism of renal origin: Secondary | ICD-10-CM | POA: Diagnosis not present

## 2018-08-25 DIAGNOSIS — Z992 Dependence on renal dialysis: Secondary | ICD-10-CM | POA: Diagnosis not present

## 2018-08-26 DIAGNOSIS — Z992 Dependence on renal dialysis: Secondary | ICD-10-CM | POA: Diagnosis not present

## 2018-08-26 DIAGNOSIS — N2581 Secondary hyperparathyroidism of renal origin: Secondary | ICD-10-CM | POA: Diagnosis not present

## 2018-08-26 DIAGNOSIS — N186 End stage renal disease: Secondary | ICD-10-CM | POA: Diagnosis not present

## 2018-08-27 ENCOUNTER — Other Ambulatory Visit: Payer: Self-pay

## 2018-08-27 ENCOUNTER — Ambulatory Visit
Admission: RE | Admit: 2018-08-27 | Discharge: 2018-08-27 | Disposition: A | Discharge status: Home / Self Care | Payer: BC Managed Care – PPO | Source: Ambulatory Visit | Attending: Internal Medicine | Admitting: Internal Medicine

## 2018-08-27 DIAGNOSIS — Z1231 Encounter for screening mammogram for malignant neoplasm of breast: Secondary | ICD-10-CM

## 2018-08-27 DIAGNOSIS — N2581 Secondary hyperparathyroidism of renal origin: Secondary | ICD-10-CM | POA: Diagnosis not present

## 2018-08-27 DIAGNOSIS — Z992 Dependence on renal dialysis: Secondary | ICD-10-CM | POA: Diagnosis not present

## 2018-08-27 DIAGNOSIS — N186 End stage renal disease: Secondary | ICD-10-CM | POA: Diagnosis not present

## 2018-08-28 DIAGNOSIS — Z992 Dependence on renal dialysis: Secondary | ICD-10-CM | POA: Diagnosis not present

## 2018-08-28 DIAGNOSIS — N2581 Secondary hyperparathyroidism of renal origin: Secondary | ICD-10-CM | POA: Diagnosis not present

## 2018-08-28 DIAGNOSIS — N186 End stage renal disease: Secondary | ICD-10-CM | POA: Diagnosis not present

## 2018-08-29 DIAGNOSIS — N186 End stage renal disease: Secondary | ICD-10-CM | POA: Diagnosis not present

## 2018-08-29 DIAGNOSIS — Z992 Dependence on renal dialysis: Secondary | ICD-10-CM | POA: Diagnosis not present

## 2018-08-29 DIAGNOSIS — N2581 Secondary hyperparathyroidism of renal origin: Secondary | ICD-10-CM | POA: Diagnosis not present

## 2018-08-30 DIAGNOSIS — N2581 Secondary hyperparathyroidism of renal origin: Secondary | ICD-10-CM | POA: Diagnosis not present

## 2018-08-30 DIAGNOSIS — N186 End stage renal disease: Secondary | ICD-10-CM | POA: Diagnosis not present

## 2018-08-30 DIAGNOSIS — Z992 Dependence on renal dialysis: Secondary | ICD-10-CM | POA: Diagnosis not present

## 2018-08-31 DIAGNOSIS — N186 End stage renal disease: Secondary | ICD-10-CM | POA: Diagnosis not present

## 2018-08-31 DIAGNOSIS — Z992 Dependence on renal dialysis: Secondary | ICD-10-CM | POA: Diagnosis not present

## 2018-08-31 DIAGNOSIS — N2581 Secondary hyperparathyroidism of renal origin: Secondary | ICD-10-CM | POA: Diagnosis not present

## 2018-09-01 DIAGNOSIS — N186 End stage renal disease: Secondary | ICD-10-CM | POA: Diagnosis not present

## 2018-09-01 DIAGNOSIS — N2581 Secondary hyperparathyroidism of renal origin: Secondary | ICD-10-CM | POA: Diagnosis not present

## 2018-09-01 DIAGNOSIS — Z992 Dependence on renal dialysis: Secondary | ICD-10-CM | POA: Diagnosis not present

## 2018-09-02 DIAGNOSIS — N2581 Secondary hyperparathyroidism of renal origin: Secondary | ICD-10-CM | POA: Diagnosis not present

## 2018-09-02 DIAGNOSIS — Z992 Dependence on renal dialysis: Secondary | ICD-10-CM | POA: Diagnosis not present

## 2018-09-02 DIAGNOSIS — N186 End stage renal disease: Secondary | ICD-10-CM | POA: Diagnosis not present

## 2018-09-03 DIAGNOSIS — Z992 Dependence on renal dialysis: Secondary | ICD-10-CM | POA: Diagnosis not present

## 2018-09-03 DIAGNOSIS — N186 End stage renal disease: Secondary | ICD-10-CM | POA: Diagnosis not present

## 2018-09-03 DIAGNOSIS — N2581 Secondary hyperparathyroidism of renal origin: Secondary | ICD-10-CM | POA: Diagnosis not present

## 2018-09-04 DIAGNOSIS — N186 End stage renal disease: Secondary | ICD-10-CM | POA: Diagnosis not present

## 2018-09-04 DIAGNOSIS — Z992 Dependence on renal dialysis: Secondary | ICD-10-CM | POA: Diagnosis not present

## 2018-09-04 DIAGNOSIS — N2581 Secondary hyperparathyroidism of renal origin: Secondary | ICD-10-CM | POA: Diagnosis not present

## 2018-09-05 DIAGNOSIS — N186 End stage renal disease: Secondary | ICD-10-CM | POA: Diagnosis not present

## 2018-09-05 DIAGNOSIS — Z992 Dependence on renal dialysis: Secondary | ICD-10-CM | POA: Diagnosis not present

## 2018-09-05 DIAGNOSIS — N2581 Secondary hyperparathyroidism of renal origin: Secondary | ICD-10-CM | POA: Diagnosis not present

## 2018-09-06 DIAGNOSIS — N2581 Secondary hyperparathyroidism of renal origin: Secondary | ICD-10-CM | POA: Diagnosis not present

## 2018-09-06 DIAGNOSIS — Z992 Dependence on renal dialysis: Secondary | ICD-10-CM | POA: Diagnosis not present

## 2018-09-06 DIAGNOSIS — N186 End stage renal disease: Secondary | ICD-10-CM | POA: Diagnosis not present

## 2018-09-07 DIAGNOSIS — N186 End stage renal disease: Secondary | ICD-10-CM | POA: Diagnosis not present

## 2018-09-07 DIAGNOSIS — Z992 Dependence on renal dialysis: Secondary | ICD-10-CM | POA: Diagnosis not present

## 2018-09-07 DIAGNOSIS — N2581 Secondary hyperparathyroidism of renal origin: Secondary | ICD-10-CM | POA: Diagnosis not present

## 2018-09-08 ENCOUNTER — Encounter: Payer: Self-pay | Admitting: Gastroenterology

## 2018-09-08 DIAGNOSIS — N186 End stage renal disease: Secondary | ICD-10-CM | POA: Diagnosis not present

## 2018-09-08 DIAGNOSIS — N2581 Secondary hyperparathyroidism of renal origin: Secondary | ICD-10-CM | POA: Diagnosis not present

## 2018-09-08 DIAGNOSIS — Z992 Dependence on renal dialysis: Secondary | ICD-10-CM | POA: Diagnosis not present

## 2018-09-09 DIAGNOSIS — N2581 Secondary hyperparathyroidism of renal origin: Secondary | ICD-10-CM | POA: Diagnosis not present

## 2018-09-09 DIAGNOSIS — N186 End stage renal disease: Secondary | ICD-10-CM | POA: Diagnosis not present

## 2018-09-09 DIAGNOSIS — Z992 Dependence on renal dialysis: Secondary | ICD-10-CM | POA: Diagnosis not present

## 2018-09-10 DIAGNOSIS — N186 End stage renal disease: Secondary | ICD-10-CM | POA: Diagnosis not present

## 2018-09-10 DIAGNOSIS — N2581 Secondary hyperparathyroidism of renal origin: Secondary | ICD-10-CM | POA: Diagnosis not present

## 2018-09-10 DIAGNOSIS — Z992 Dependence on renal dialysis: Secondary | ICD-10-CM | POA: Diagnosis not present

## 2018-09-11 DIAGNOSIS — N2581 Secondary hyperparathyroidism of renal origin: Secondary | ICD-10-CM | POA: Diagnosis not present

## 2018-09-11 DIAGNOSIS — Z992 Dependence on renal dialysis: Secondary | ICD-10-CM | POA: Diagnosis not present

## 2018-09-11 DIAGNOSIS — N186 End stage renal disease: Secondary | ICD-10-CM | POA: Diagnosis not present

## 2018-09-12 DIAGNOSIS — Z992 Dependence on renal dialysis: Secondary | ICD-10-CM | POA: Diagnosis not present

## 2018-09-12 DIAGNOSIS — N2581 Secondary hyperparathyroidism of renal origin: Secondary | ICD-10-CM | POA: Diagnosis not present

## 2018-09-12 DIAGNOSIS — N186 End stage renal disease: Secondary | ICD-10-CM | POA: Diagnosis not present

## 2018-09-13 DIAGNOSIS — N2581 Secondary hyperparathyroidism of renal origin: Secondary | ICD-10-CM | POA: Diagnosis not present

## 2018-09-13 DIAGNOSIS — Z992 Dependence on renal dialysis: Secondary | ICD-10-CM | POA: Diagnosis not present

## 2018-09-13 DIAGNOSIS — N186 End stage renal disease: Secondary | ICD-10-CM | POA: Diagnosis not present

## 2018-09-14 DIAGNOSIS — N2581 Secondary hyperparathyroidism of renal origin: Secondary | ICD-10-CM | POA: Diagnosis not present

## 2018-09-14 DIAGNOSIS — Z992 Dependence on renal dialysis: Secondary | ICD-10-CM | POA: Diagnosis not present

## 2018-09-14 DIAGNOSIS — N186 End stage renal disease: Secondary | ICD-10-CM | POA: Diagnosis not present

## 2018-09-15 DIAGNOSIS — N186 End stage renal disease: Secondary | ICD-10-CM | POA: Diagnosis not present

## 2018-09-15 DIAGNOSIS — Z992 Dependence on renal dialysis: Secondary | ICD-10-CM | POA: Diagnosis not present

## 2018-09-15 DIAGNOSIS — N2581 Secondary hyperparathyroidism of renal origin: Secondary | ICD-10-CM | POA: Diagnosis not present

## 2018-09-16 DIAGNOSIS — N186 End stage renal disease: Secondary | ICD-10-CM | POA: Diagnosis not present

## 2018-09-16 DIAGNOSIS — Z992 Dependence on renal dialysis: Secondary | ICD-10-CM | POA: Diagnosis not present

## 2018-09-16 DIAGNOSIS — N2581 Secondary hyperparathyroidism of renal origin: Secondary | ICD-10-CM | POA: Diagnosis not present

## 2018-09-17 DIAGNOSIS — N2581 Secondary hyperparathyroidism of renal origin: Secondary | ICD-10-CM | POA: Diagnosis not present

## 2018-09-17 DIAGNOSIS — N186 End stage renal disease: Secondary | ICD-10-CM | POA: Diagnosis not present

## 2018-09-17 DIAGNOSIS — Z992 Dependence on renal dialysis: Secondary | ICD-10-CM | POA: Diagnosis not present

## 2018-09-18 DIAGNOSIS — Z992 Dependence on renal dialysis: Secondary | ICD-10-CM | POA: Diagnosis not present

## 2018-09-18 DIAGNOSIS — N186 End stage renal disease: Secondary | ICD-10-CM | POA: Diagnosis not present

## 2018-09-18 DIAGNOSIS — N2581 Secondary hyperparathyroidism of renal origin: Secondary | ICD-10-CM | POA: Diagnosis not present

## 2018-09-19 DIAGNOSIS — N2581 Secondary hyperparathyroidism of renal origin: Secondary | ICD-10-CM | POA: Diagnosis not present

## 2018-09-19 DIAGNOSIS — N186 End stage renal disease: Secondary | ICD-10-CM | POA: Diagnosis not present

## 2018-09-19 DIAGNOSIS — Z992 Dependence on renal dialysis: Secondary | ICD-10-CM | POA: Diagnosis not present

## 2018-09-20 DIAGNOSIS — N186 End stage renal disease: Secondary | ICD-10-CM | POA: Diagnosis not present

## 2018-09-20 DIAGNOSIS — N2581 Secondary hyperparathyroidism of renal origin: Secondary | ICD-10-CM | POA: Diagnosis not present

## 2018-09-20 DIAGNOSIS — Z992 Dependence on renal dialysis: Secondary | ICD-10-CM | POA: Diagnosis not present

## 2018-09-20 DIAGNOSIS — I129 Hypertensive chronic kidney disease with stage 1 through stage 4 chronic kidney disease, or unspecified chronic kidney disease: Secondary | ICD-10-CM | POA: Diagnosis not present

## 2018-09-21 ENCOUNTER — Other Ambulatory Visit: Payer: Self-pay

## 2018-09-21 ENCOUNTER — Ambulatory Visit (INDEPENDENT_AMBULATORY_CARE_PROVIDER_SITE_OTHER): Payer: BC Managed Care – PPO | Admitting: Internal Medicine

## 2018-09-21 VITALS — BP 125/74 | HR 88 | Temp 98.1°F | Ht 66.0 in | Wt 172.0 lb

## 2018-09-21 DIAGNOSIS — R011 Cardiac murmur, unspecified: Secondary | ICD-10-CM | POA: Diagnosis not present

## 2018-09-21 DIAGNOSIS — N186 End stage renal disease: Secondary | ICD-10-CM | POA: Diagnosis not present

## 2018-09-21 DIAGNOSIS — X58XXXA Exposure to other specified factors, initial encounter: Secondary | ICD-10-CM

## 2018-09-21 DIAGNOSIS — S46811A Strain of other muscles, fascia and tendons at shoulder and upper arm level, right arm, initial encounter: Secondary | ICD-10-CM | POA: Diagnosis not present

## 2018-09-21 DIAGNOSIS — N2581 Secondary hyperparathyroidism of renal origin: Secondary | ICD-10-CM | POA: Diagnosis not present

## 2018-09-21 DIAGNOSIS — S46911A Strain of unspecified muscle, fascia and tendon at shoulder and upper arm level, right arm, initial encounter: Secondary | ICD-10-CM | POA: Insufficient documentation

## 2018-09-21 DIAGNOSIS — Z992 Dependence on renal dialysis: Secondary | ICD-10-CM | POA: Diagnosis not present

## 2018-09-21 HISTORY — DX: Strain of unspecified muscle, fascia and tendon at shoulder and upper arm level, right arm, initial encounter: S46.911A

## 2018-09-21 MED ORDER — DICLOFENAC SODIUM 1 % TD GEL: 2.0000 g | Freq: Four times a day (QID) | TRANSDERMAL | Status: DC
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
## 2018-09-21 NOTE — Patient Instructions (Addendum)
You were seen for your R shoulder/back pain. It looks like you strained a muscle in that area. Use can use a heating pad at home in addition to applying the prescription topical gel (diclofenac gel) that I have sent into your pharmacy to help with the pain. Massaging the area and ice are also beneficial. Due to your kidney disease, I would avoid using any ibuprofen.  Please call or return to the office if the pain does not improve in the next 2-3 weeks, or you begin experiencing muscle weakness that limits your day-to-day life.   Muscle Strain A muscle strain is an injury that happens when a muscle is stretched longer than normal. This can happen during a fall, sports, or lifting. This can tear some muscle fibers. Usually, recovery from muscle strain takes 1-2 weeks. Complete healing normally takes 5-6 weeks. This condition is first treated with PRICE therapy. This involves:  Protecting your muscle from being injured again.  Resting your injured muscle.  Icing your injured muscle.  Applying pressure (compression) to your injured muscle. This may be done with a splint or elastic bandage.  Raising (elevating) your injured muscle. Your doctor may also recommend medicine for pain. Follow these instructions at home: If you have a splint:  Wear the splint as told by your doctor. Take it off only as told by your doctor.  Loosen the splint if your fingers or toes tingle, get numb, or turn cold and blue.  Keep the splint clean.  If the splint is not waterproof: ? Do not let it get wet. ? Cover it with a watertight covering when you take a bath or a shower. Managing pain, stiffness, and swelling   If directed, put ice on your injured area. ? If you have a removable splint, take it off as told by your doctor. ? Put ice in a plastic bag. ? Place a towel between your skin and the bag. ? Leave the ice on for 20 minutes, 2-3 times a day.  Move your fingers or toes often. This helps to avoid  stiffness and lessen swelling.  Raise your injured area above the level of your heart while you are sitting or lying down.  Wear an elastic bandage as told by your doctor. Make sure it is not too tight. General instructions  Take over-the-counter and prescription medicines only as told by your doctor.  Limit your activity. Rest your injured muscle as told by your doctor. Your doctor may say that gentle movements are okay.  If physical therapy was prescribed, do exercises as told by your doctor.  Do not put pressure on any part of the splint until it is fully hardened. This may take many hours.  Do not use any products that contain nicotine or tobacco, such as cigarettes and e-cigarettes. These can delay bone healing. If you need help quitting, ask your doctor.  Warm up before you exercise. This helps to prevent more muscle strains.  Ask your doctor when it is safe to drive if you have a splint.  Keep all follow-up visits as told by your doctor. This is important. Contact a doctor if:  You have more pain or swelling in your injured area. Get help right away if:  You have any of these problems in your injured area: ? You have numbness. ? You have tingling. ? You lose a lot of strength. Summary  A muscle strain is an injury that happens when a muscle is stretched longer than normal.  This condition is first treated with PRICE therapy. This includes protecting, resting, icing, adding pressure, and raising your injury.  Limit your activity. Rest your injured muscle as told by your doctor. Your doctor may say that gentle movements are okay.  Warm up before you exercise. This helps to prevent more muscle strains. This information is not intended to replace advice given to you by your health care provider. Make sure you discuss any questions you have with your health care provider. Document Released: 10/16/2007 Document Revised: 03/04/2018 Document Reviewed: 02/13/2016 Elsevier  Patient Education  2020 [employer].

## 2018-09-21 NOTE — Assessment & Plan Note (Signed)
Pt seen in clinic today for pain in the R scapular region of her shoulder and back. It began 1-2 weeks ago. She rates the pain at a 4/10 at rest, and a 9/10 with activities like reaching and lifting something out of a cabinet. Denies any associated skin changes. Has not tried any over the counter medications at home or ice/heat. States that she occasionally sleeps on that side. 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  On physical exam, pt has full ROM and strength in her shoulders bilaterally. Her R scapular region is tender to palpation with associated muscle know.   Assessment - Acute muscle strain. - prescribed diclofenac gel (would avoid systemic NSAIDs due to her CKD) - encouraged her to use a heating pad at home - told pt to return in 2-3 weeks if her symptoms did not improve, could consider a muscle relaxant at that time

## 2018-09-22 DIAGNOSIS — N186 End stage renal disease: Secondary | ICD-10-CM | POA: Diagnosis not present

## 2018-09-22 DIAGNOSIS — Z4901 Encounter for fitting and adjustment of extracorporeal dialysis catheter: Secondary | ICD-10-CM | POA: Insufficient documentation

## 2018-09-22 DIAGNOSIS — Z992 Dependence on renal dialysis: Secondary | ICD-10-CM | POA: Diagnosis not present

## 2018-09-22 DIAGNOSIS — N2581 Secondary hyperparathyroidism of renal origin: Secondary | ICD-10-CM | POA: Diagnosis not present

## 2018-09-23 ENCOUNTER — Telehealth: Payer: Self-pay | Admitting: Internal Medicine

## 2018-09-23 DIAGNOSIS — N2581 Secondary hyperparathyroidism of renal origin: Secondary | ICD-10-CM | POA: Diagnosis not present

## 2018-09-23 DIAGNOSIS — N186 End stage renal disease: Secondary | ICD-10-CM | POA: Diagnosis not present

## 2018-09-23 DIAGNOSIS — Z992 Dependence on renal dialysis: Secondary | ICD-10-CM | POA: Diagnosis not present

## 2018-09-23 MED ORDER — DICLOFENAC SODIUM 1 % TD GEL: 2.0000 g | Freq: Four times a day (QID) | TRANSDERMAL | 1 refills | Status: DC

## 2018-09-23 NOTE — Telephone Encounter (Signed)
Refill Request Pt seen on 09/21/2018 by Dr. Ladona Horns.  Pt has been to the pharmacy and was told it was not called in. Pt also requesting a call back.  diclofenac sodium (VOLTAREN) 1 % transdermal gel 2 g  Please call in to [facility] on [address]

## 2018-09-23 NOTE — Progress Notes (Signed)
Internal Medicine Clinic Attending  I saw and evaluated the patient.  I personally confirmed the key portions of the history and exam documented by Dr. Jones and I reviewed pertinent patient test results.  The assessment, diagnosis, and plan were formulated together and I agree with the documentation in the resident's note.     

## 2018-09-23 NOTE — Telephone Encounter (Signed)
Prescription for Voltaren gel sent into [facility] on [address]. Called pt back and she confirmed appropriate pharmacy. Pt had no further questions or concerns at this time.

## 2018-09-24 DIAGNOSIS — N186 End stage renal disease: Secondary | ICD-10-CM | POA: Diagnosis not present

## 2018-09-24 DIAGNOSIS — N2581 Secondary hyperparathyroidism of renal origin: Secondary | ICD-10-CM | POA: Diagnosis not present

## 2018-09-24 DIAGNOSIS — Z992 Dependence on renal dialysis: Secondary | ICD-10-CM | POA: Diagnosis not present

## 2018-09-25 DIAGNOSIS — Z992 Dependence on renal dialysis: Secondary | ICD-10-CM | POA: Diagnosis not present

## 2018-09-25 DIAGNOSIS — N186 End stage renal disease: Secondary | ICD-10-CM | POA: Diagnosis not present

## 2018-09-25 DIAGNOSIS — N2581 Secondary hyperparathyroidism of renal origin: Secondary | ICD-10-CM | POA: Diagnosis not present

## 2018-09-26 DIAGNOSIS — Z992 Dependence on renal dialysis: Secondary | ICD-10-CM | POA: Diagnosis not present

## 2018-09-26 DIAGNOSIS — N186 End stage renal disease: Secondary | ICD-10-CM | POA: Diagnosis not present

## 2018-09-26 DIAGNOSIS — N2581 Secondary hyperparathyroidism of renal origin: Secondary | ICD-10-CM | POA: Diagnosis not present

## 2018-09-27 DIAGNOSIS — N2581 Secondary hyperparathyroidism of renal origin: Secondary | ICD-10-CM | POA: Diagnosis not present

## 2018-09-27 DIAGNOSIS — N186 End stage renal disease: Secondary | ICD-10-CM | POA: Diagnosis not present

## 2018-09-27 DIAGNOSIS — Z992 Dependence on renal dialysis: Secondary | ICD-10-CM | POA: Diagnosis not present

## 2018-09-28 DIAGNOSIS — N186 End stage renal disease: Secondary | ICD-10-CM | POA: Diagnosis not present

## 2018-09-28 DIAGNOSIS — Z992 Dependence on renal dialysis: Secondary | ICD-10-CM | POA: Diagnosis not present

## 2018-09-28 DIAGNOSIS — N2581 Secondary hyperparathyroidism of renal origin: Secondary | ICD-10-CM | POA: Diagnosis not present

## 2018-09-29 DIAGNOSIS — N186 End stage renal disease: Secondary | ICD-10-CM | POA: Diagnosis not present

## 2018-09-29 DIAGNOSIS — Z992 Dependence on renal dialysis: Secondary | ICD-10-CM | POA: Diagnosis not present

## 2018-09-29 DIAGNOSIS — N2581 Secondary hyperparathyroidism of renal origin: Secondary | ICD-10-CM | POA: Diagnosis not present

## 2018-09-30 DIAGNOSIS — N186 End stage renal disease: Secondary | ICD-10-CM | POA: Diagnosis not present

## 2018-09-30 DIAGNOSIS — N2581 Secondary hyperparathyroidism of renal origin: Secondary | ICD-10-CM | POA: Diagnosis not present

## 2018-09-30 DIAGNOSIS — Z992 Dependence on renal dialysis: Secondary | ICD-10-CM | POA: Diagnosis not present

## 2018-10-01 DIAGNOSIS — N2581 Secondary hyperparathyroidism of renal origin: Secondary | ICD-10-CM | POA: Diagnosis not present

## 2018-10-01 DIAGNOSIS — Z992 Dependence on renal dialysis: Secondary | ICD-10-CM | POA: Diagnosis not present

## 2018-10-01 DIAGNOSIS — N186 End stage renal disease: Secondary | ICD-10-CM | POA: Diagnosis not present

## 2018-10-02 DIAGNOSIS — Z992 Dependence on renal dialysis: Secondary | ICD-10-CM | POA: Diagnosis not present

## 2018-10-02 DIAGNOSIS — N2581 Secondary hyperparathyroidism of renal origin: Secondary | ICD-10-CM | POA: Diagnosis not present

## 2018-10-02 DIAGNOSIS — N186 End stage renal disease: Secondary | ICD-10-CM | POA: Diagnosis not present

## 2018-10-03 DIAGNOSIS — Z992 Dependence on renal dialysis: Secondary | ICD-10-CM | POA: Diagnosis not present

## 2018-10-03 DIAGNOSIS — N186 End stage renal disease: Secondary | ICD-10-CM | POA: Diagnosis not present

## 2018-10-03 DIAGNOSIS — N2581 Secondary hyperparathyroidism of renal origin: Secondary | ICD-10-CM | POA: Diagnosis not present

## 2018-10-04 DIAGNOSIS — Z992 Dependence on renal dialysis: Secondary | ICD-10-CM | POA: Diagnosis not present

## 2018-10-04 DIAGNOSIS — N2581 Secondary hyperparathyroidism of renal origin: Secondary | ICD-10-CM | POA: Diagnosis not present

## 2018-10-04 DIAGNOSIS — N186 End stage renal disease: Secondary | ICD-10-CM | POA: Diagnosis not present

## 2018-10-05 DIAGNOSIS — Z992 Dependence on renal dialysis: Secondary | ICD-10-CM | POA: Diagnosis not present

## 2018-10-05 DIAGNOSIS — N2581 Secondary hyperparathyroidism of renal origin: Secondary | ICD-10-CM | POA: Diagnosis not present

## 2018-10-05 DIAGNOSIS — N186 End stage renal disease: Secondary | ICD-10-CM | POA: Diagnosis not present

## 2018-10-06 DIAGNOSIS — Z992 Dependence on renal dialysis: Secondary | ICD-10-CM | POA: Diagnosis not present

## 2018-10-06 DIAGNOSIS — N2581 Secondary hyperparathyroidism of renal origin: Secondary | ICD-10-CM | POA: Diagnosis not present

## 2018-10-06 DIAGNOSIS — N186 End stage renal disease: Secondary | ICD-10-CM | POA: Diagnosis not present

## 2018-10-07 DIAGNOSIS — N2581 Secondary hyperparathyroidism of renal origin: Secondary | ICD-10-CM | POA: Diagnosis not present

## 2018-10-07 DIAGNOSIS — N186 End stage renal disease: Secondary | ICD-10-CM | POA: Diagnosis not present

## 2018-10-07 DIAGNOSIS — Z992 Dependence on renal dialysis: Secondary | ICD-10-CM | POA: Diagnosis not present

## 2018-10-08 ENCOUNTER — Other Ambulatory Visit: Payer: Self-pay | Admitting: *Deleted

## 2018-10-08 DIAGNOSIS — N2581 Secondary hyperparathyroidism of renal origin: Secondary | ICD-10-CM | POA: Diagnosis not present

## 2018-10-08 DIAGNOSIS — N186 End stage renal disease: Secondary | ICD-10-CM | POA: Diagnosis not present

## 2018-10-08 DIAGNOSIS — Z992 Dependence on renal dialysis: Secondary | ICD-10-CM | POA: Diagnosis not present

## 2018-10-08 DIAGNOSIS — Q613 Polycystic kidney, unspecified: Secondary | ICD-10-CM

## 2018-10-08 MED ORDER — PROMETHAZINE HCL 25 MG PO TABS: 12.5000 mg | ORAL_TABLET | Freq: Four times a day (QID) | ORAL | 1 refills | Status: DC | PRN

## 2018-10-09 DIAGNOSIS — Z992 Dependence on renal dialysis: Secondary | ICD-10-CM | POA: Diagnosis not present

## 2018-10-09 DIAGNOSIS — N186 End stage renal disease: Secondary | ICD-10-CM | POA: Diagnosis not present

## 2018-10-09 DIAGNOSIS — N2581 Secondary hyperparathyroidism of renal origin: Secondary | ICD-10-CM | POA: Diagnosis not present

## 2018-10-10 DIAGNOSIS — N186 End stage renal disease: Secondary | ICD-10-CM | POA: Diagnosis not present

## 2018-10-10 DIAGNOSIS — Z992 Dependence on renal dialysis: Secondary | ICD-10-CM | POA: Diagnosis not present

## 2018-10-10 DIAGNOSIS — N2581 Secondary hyperparathyroidism of renal origin: Secondary | ICD-10-CM | POA: Diagnosis not present

## 2018-10-11 DIAGNOSIS — Z992 Dependence on renal dialysis: Secondary | ICD-10-CM | POA: Diagnosis not present

## 2018-10-11 DIAGNOSIS — N186 End stage renal disease: Secondary | ICD-10-CM | POA: Diagnosis not present

## 2018-10-11 DIAGNOSIS — N2581 Secondary hyperparathyroidism of renal origin: Secondary | ICD-10-CM | POA: Diagnosis not present

## 2018-10-12 DIAGNOSIS — Z992 Dependence on renal dialysis: Secondary | ICD-10-CM | POA: Diagnosis not present

## 2018-10-12 DIAGNOSIS — N186 End stage renal disease: Secondary | ICD-10-CM | POA: Diagnosis not present

## 2018-10-12 DIAGNOSIS — N2581 Secondary hyperparathyroidism of renal origin: Secondary | ICD-10-CM | POA: Diagnosis not present

## 2018-10-13 DIAGNOSIS — N2581 Secondary hyperparathyroidism of renal origin: Secondary | ICD-10-CM | POA: Diagnosis not present

## 2018-10-13 DIAGNOSIS — Z992 Dependence on renal dialysis: Secondary | ICD-10-CM | POA: Diagnosis not present

## 2018-10-13 DIAGNOSIS — N186 End stage renal disease: Secondary | ICD-10-CM | POA: Diagnosis not present

## 2018-10-14 DIAGNOSIS — Z992 Dependence on renal dialysis: Secondary | ICD-10-CM | POA: Diagnosis not present

## 2018-10-14 DIAGNOSIS — N186 End stage renal disease: Secondary | ICD-10-CM | POA: Diagnosis not present

## 2018-10-14 DIAGNOSIS — N2581 Secondary hyperparathyroidism of renal origin: Secondary | ICD-10-CM | POA: Diagnosis not present

## 2018-10-15 DIAGNOSIS — Z992 Dependence on renal dialysis: Secondary | ICD-10-CM | POA: Diagnosis not present

## 2018-10-15 DIAGNOSIS — N2581 Secondary hyperparathyroidism of renal origin: Secondary | ICD-10-CM | POA: Diagnosis not present

## 2018-10-15 DIAGNOSIS — N186 End stage renal disease: Secondary | ICD-10-CM | POA: Diagnosis not present

## 2018-10-16 DIAGNOSIS — Z992 Dependence on renal dialysis: Secondary | ICD-10-CM | POA: Diagnosis not present

## 2018-10-16 DIAGNOSIS — N186 End stage renal disease: Secondary | ICD-10-CM | POA: Diagnosis not present

## 2018-10-16 DIAGNOSIS — N2581 Secondary hyperparathyroidism of renal origin: Secondary | ICD-10-CM | POA: Diagnosis not present

## 2018-10-17 DIAGNOSIS — Z992 Dependence on renal dialysis: Secondary | ICD-10-CM | POA: Diagnosis not present

## 2018-10-17 DIAGNOSIS — N2581 Secondary hyperparathyroidism of renal origin: Secondary | ICD-10-CM | POA: Diagnosis not present

## 2018-10-17 DIAGNOSIS — N186 End stage renal disease: Secondary | ICD-10-CM | POA: Diagnosis not present

## 2018-10-18 DIAGNOSIS — Z992 Dependence on renal dialysis: Secondary | ICD-10-CM | POA: Diagnosis not present

## 2018-10-18 DIAGNOSIS — N186 End stage renal disease: Secondary | ICD-10-CM | POA: Diagnosis not present

## 2018-10-18 DIAGNOSIS — N2581 Secondary hyperparathyroidism of renal origin: Secondary | ICD-10-CM | POA: Diagnosis not present

## 2018-10-19 DIAGNOSIS — N186 End stage renal disease: Secondary | ICD-10-CM | POA: Diagnosis not present

## 2018-10-19 DIAGNOSIS — Z992 Dependence on renal dialysis: Secondary | ICD-10-CM | POA: Diagnosis not present

## 2018-10-19 DIAGNOSIS — N2581 Secondary hyperparathyroidism of renal origin: Secondary | ICD-10-CM | POA: Diagnosis not present

## 2018-10-20 DIAGNOSIS — I129 Hypertensive chronic kidney disease with stage 1 through stage 4 chronic kidney disease, or unspecified chronic kidney disease: Secondary | ICD-10-CM | POA: Diagnosis not present

## 2018-10-20 DIAGNOSIS — N186 End stage renal disease: Secondary | ICD-10-CM | POA: Diagnosis not present

## 2018-10-20 DIAGNOSIS — N2581 Secondary hyperparathyroidism of renal origin: Secondary | ICD-10-CM | POA: Diagnosis not present

## 2018-10-20 DIAGNOSIS — Z992 Dependence on renal dialysis: Secondary | ICD-10-CM | POA: Diagnosis not present

## 2018-10-21 DIAGNOSIS — N2581 Secondary hyperparathyroidism of renal origin: Secondary | ICD-10-CM | POA: Diagnosis not present

## 2018-10-21 DIAGNOSIS — N186 End stage renal disease: Secondary | ICD-10-CM | POA: Diagnosis not present

## 2018-10-21 DIAGNOSIS — Z992 Dependence on renal dialysis: Secondary | ICD-10-CM | POA: Diagnosis not present

## 2018-10-22 DIAGNOSIS — N186 End stage renal disease: Secondary | ICD-10-CM | POA: Diagnosis not present

## 2018-10-22 DIAGNOSIS — N2581 Secondary hyperparathyroidism of renal origin: Secondary | ICD-10-CM | POA: Diagnosis not present

## 2018-10-22 DIAGNOSIS — Z992 Dependence on renal dialysis: Secondary | ICD-10-CM | POA: Diagnosis not present

## 2018-10-23 DIAGNOSIS — N186 End stage renal disease: Secondary | ICD-10-CM | POA: Diagnosis not present

## 2018-10-23 DIAGNOSIS — N2581 Secondary hyperparathyroidism of renal origin: Secondary | ICD-10-CM | POA: Diagnosis not present

## 2018-10-23 DIAGNOSIS — Z992 Dependence on renal dialysis: Secondary | ICD-10-CM | POA: Diagnosis not present

## 2018-10-24 DIAGNOSIS — N186 End stage renal disease: Secondary | ICD-10-CM | POA: Diagnosis not present

## 2018-10-24 DIAGNOSIS — Z992 Dependence on renal dialysis: Secondary | ICD-10-CM | POA: Diagnosis not present

## 2018-10-24 DIAGNOSIS — N2581 Secondary hyperparathyroidism of renal origin: Secondary | ICD-10-CM | POA: Diagnosis not present

## 2018-10-24 DIAGNOSIS — Z23 Encounter for immunization: Secondary | ICD-10-CM | POA: Diagnosis not present

## 2018-10-25 DIAGNOSIS — N186 End stage renal disease: Secondary | ICD-10-CM | POA: Diagnosis not present

## 2018-10-25 DIAGNOSIS — N2581 Secondary hyperparathyroidism of renal origin: Secondary | ICD-10-CM | POA: Diagnosis not present

## 2018-10-25 DIAGNOSIS — Z23 Encounter for immunization: Secondary | ICD-10-CM | POA: Diagnosis not present

## 2018-10-25 DIAGNOSIS — Z992 Dependence on renal dialysis: Secondary | ICD-10-CM | POA: Diagnosis not present

## 2018-10-26 DIAGNOSIS — Z992 Dependence on renal dialysis: Secondary | ICD-10-CM | POA: Diagnosis not present

## 2018-10-26 DIAGNOSIS — N2581 Secondary hyperparathyroidism of renal origin: Secondary | ICD-10-CM | POA: Diagnosis not present

## 2018-10-26 DIAGNOSIS — Z23 Encounter for immunization: Secondary | ICD-10-CM | POA: Diagnosis not present

## 2018-10-26 DIAGNOSIS — N186 End stage renal disease: Secondary | ICD-10-CM | POA: Diagnosis not present

## 2018-10-27 DIAGNOSIS — N186 End stage renal disease: Secondary | ICD-10-CM | POA: Diagnosis not present

## 2018-10-27 DIAGNOSIS — N2581 Secondary hyperparathyroidism of renal origin: Secondary | ICD-10-CM | POA: Diagnosis not present

## 2018-10-27 DIAGNOSIS — Z23 Encounter for immunization: Secondary | ICD-10-CM | POA: Diagnosis not present

## 2018-10-27 DIAGNOSIS — Z992 Dependence on renal dialysis: Secondary | ICD-10-CM | POA: Diagnosis not present

## 2018-10-28 DIAGNOSIS — Z23 Encounter for immunization: Secondary | ICD-10-CM | POA: Diagnosis not present

## 2018-10-28 DIAGNOSIS — N186 End stage renal disease: Secondary | ICD-10-CM | POA: Diagnosis not present

## 2018-10-28 DIAGNOSIS — N2581 Secondary hyperparathyroidism of renal origin: Secondary | ICD-10-CM | POA: Diagnosis not present

## 2018-10-28 DIAGNOSIS — Z992 Dependence on renal dialysis: Secondary | ICD-10-CM | POA: Diagnosis not present

## 2018-10-29 DIAGNOSIS — N186 End stage renal disease: Secondary | ICD-10-CM | POA: Diagnosis not present

## 2018-10-29 DIAGNOSIS — Z992 Dependence on renal dialysis: Secondary | ICD-10-CM | POA: Diagnosis not present

## 2018-10-29 DIAGNOSIS — N2581 Secondary hyperparathyroidism of renal origin: Secondary | ICD-10-CM | POA: Diagnosis not present

## 2018-10-29 DIAGNOSIS — Z23 Encounter for immunization: Secondary | ICD-10-CM | POA: Diagnosis not present

## 2018-10-30 DIAGNOSIS — N2581 Secondary hyperparathyroidism of renal origin: Secondary | ICD-10-CM | POA: Diagnosis not present

## 2018-10-30 DIAGNOSIS — N186 End stage renal disease: Secondary | ICD-10-CM | POA: Diagnosis not present

## 2018-10-30 DIAGNOSIS — Z23 Encounter for immunization: Secondary | ICD-10-CM | POA: Diagnosis not present

## 2018-10-30 DIAGNOSIS — Z992 Dependence on renal dialysis: Secondary | ICD-10-CM | POA: Diagnosis not present

## 2018-10-31 DIAGNOSIS — N2581 Secondary hyperparathyroidism of renal origin: Secondary | ICD-10-CM | POA: Diagnosis not present

## 2018-10-31 DIAGNOSIS — Z992 Dependence on renal dialysis: Secondary | ICD-10-CM | POA: Diagnosis not present

## 2018-10-31 DIAGNOSIS — N186 End stage renal disease: Secondary | ICD-10-CM | POA: Diagnosis not present

## 2018-11-01 DIAGNOSIS — Z992 Dependence on renal dialysis: Secondary | ICD-10-CM | POA: Diagnosis not present

## 2018-11-01 DIAGNOSIS — N186 End stage renal disease: Secondary | ICD-10-CM | POA: Diagnosis not present

## 2018-11-01 DIAGNOSIS — N2581 Secondary hyperparathyroidism of renal origin: Secondary | ICD-10-CM | POA: Diagnosis not present

## 2018-11-02 DIAGNOSIS — N2581 Secondary hyperparathyroidism of renal origin: Secondary | ICD-10-CM | POA: Diagnosis not present

## 2018-11-02 DIAGNOSIS — Z992 Dependence on renal dialysis: Secondary | ICD-10-CM | POA: Diagnosis not present

## 2018-11-02 DIAGNOSIS — N186 End stage renal disease: Secondary | ICD-10-CM | POA: Diagnosis not present

## 2018-11-03 DIAGNOSIS — Z992 Dependence on renal dialysis: Secondary | ICD-10-CM | POA: Diagnosis not present

## 2018-11-03 DIAGNOSIS — N2581 Secondary hyperparathyroidism of renal origin: Secondary | ICD-10-CM | POA: Diagnosis not present

## 2018-11-03 DIAGNOSIS — N186 End stage renal disease: Secondary | ICD-10-CM | POA: Diagnosis not present

## 2018-11-04 ENCOUNTER — Other Ambulatory Visit: Payer: Self-pay | Admitting: Nephrology

## 2018-11-04 DIAGNOSIS — N2581 Secondary hyperparathyroidism of renal origin: Secondary | ICD-10-CM | POA: Diagnosis not present

## 2018-11-04 DIAGNOSIS — N186 End stage renal disease: Secondary | ICD-10-CM | POA: Diagnosis not present

## 2018-11-04 DIAGNOSIS — Z992 Dependence on renal dialysis: Secondary | ICD-10-CM | POA: Diagnosis not present

## 2018-11-04 DIAGNOSIS — R109 Unspecified abdominal pain: Secondary | ICD-10-CM

## 2018-11-05 DIAGNOSIS — N2581 Secondary hyperparathyroidism of renal origin: Secondary | ICD-10-CM | POA: Diagnosis not present

## 2018-11-05 DIAGNOSIS — N186 End stage renal disease: Secondary | ICD-10-CM | POA: Diagnosis not present

## 2018-11-05 DIAGNOSIS — Z992 Dependence on renal dialysis: Secondary | ICD-10-CM | POA: Diagnosis not present

## 2018-11-06 DIAGNOSIS — Z992 Dependence on renal dialysis: Secondary | ICD-10-CM | POA: Diagnosis not present

## 2018-11-06 DIAGNOSIS — N186 End stage renal disease: Secondary | ICD-10-CM | POA: Diagnosis not present

## 2018-11-06 DIAGNOSIS — N2581 Secondary hyperparathyroidism of renal origin: Secondary | ICD-10-CM | POA: Diagnosis not present

## 2018-11-07 DIAGNOSIS — Z992 Dependence on renal dialysis: Secondary | ICD-10-CM | POA: Diagnosis not present

## 2018-11-07 DIAGNOSIS — N2581 Secondary hyperparathyroidism of renal origin: Secondary | ICD-10-CM | POA: Diagnosis not present

## 2018-11-07 DIAGNOSIS — N186 End stage renal disease: Secondary | ICD-10-CM | POA: Diagnosis not present

## 2018-11-08 DIAGNOSIS — Z992 Dependence on renal dialysis: Secondary | ICD-10-CM | POA: Diagnosis not present

## 2018-11-08 DIAGNOSIS — N186 End stage renal disease: Secondary | ICD-10-CM | POA: Diagnosis not present

## 2018-11-08 DIAGNOSIS — N2581 Secondary hyperparathyroidism of renal origin: Secondary | ICD-10-CM | POA: Diagnosis not present

## 2018-11-09 DIAGNOSIS — Z992 Dependence on renal dialysis: Secondary | ICD-10-CM | POA: Diagnosis not present

## 2018-11-09 DIAGNOSIS — N2581 Secondary hyperparathyroidism of renal origin: Secondary | ICD-10-CM | POA: Diagnosis not present

## 2018-11-09 DIAGNOSIS — N186 End stage renal disease: Secondary | ICD-10-CM | POA: Diagnosis not present

## 2018-11-10 DIAGNOSIS — Z992 Dependence on renal dialysis: Secondary | ICD-10-CM | POA: Diagnosis not present

## 2018-11-10 DIAGNOSIS — N2581 Secondary hyperparathyroidism of renal origin: Secondary | ICD-10-CM | POA: Diagnosis not present

## 2018-11-10 DIAGNOSIS — N186 End stage renal disease: Secondary | ICD-10-CM | POA: Diagnosis not present

## 2018-11-11 ENCOUNTER — Ambulatory Visit
Admission: RE | Admit: 2018-11-11 | Discharge: 2018-11-11 | Disposition: A | Discharge status: Home / Self Care | Payer: BC Managed Care – PPO | Source: Ambulatory Visit | Attending: Nephrology | Admitting: Nephrology

## 2018-11-11 DIAGNOSIS — N186 End stage renal disease: Secondary | ICD-10-CM | POA: Diagnosis not present

## 2018-11-11 DIAGNOSIS — K5730 Diverticulosis of large intestine without perforation or abscess without bleeding: Secondary | ICD-10-CM | POA: Diagnosis not present

## 2018-11-11 DIAGNOSIS — N2581 Secondary hyperparathyroidism of renal origin: Secondary | ICD-10-CM | POA: Diagnosis not present

## 2018-11-11 DIAGNOSIS — Z992 Dependence on renal dialysis: Secondary | ICD-10-CM | POA: Diagnosis not present

## 2018-11-11 DIAGNOSIS — R109 Unspecified abdominal pain: Secondary | ICD-10-CM

## 2018-11-12 DIAGNOSIS — Z992 Dependence on renal dialysis: Secondary | ICD-10-CM | POA: Diagnosis not present

## 2018-11-12 DIAGNOSIS — N2581 Secondary hyperparathyroidism of renal origin: Secondary | ICD-10-CM | POA: Diagnosis not present

## 2018-11-12 DIAGNOSIS — N186 End stage renal disease: Secondary | ICD-10-CM | POA: Diagnosis not present

## 2018-11-13 DIAGNOSIS — Z992 Dependence on renal dialysis: Secondary | ICD-10-CM | POA: Diagnosis not present

## 2018-11-13 DIAGNOSIS — N186 End stage renal disease: Secondary | ICD-10-CM | POA: Diagnosis not present

## 2018-11-13 DIAGNOSIS — N2581 Secondary hyperparathyroidism of renal origin: Secondary | ICD-10-CM | POA: Diagnosis not present

## 2018-11-14 DIAGNOSIS — N2581 Secondary hyperparathyroidism of renal origin: Secondary | ICD-10-CM | POA: Diagnosis not present

## 2018-11-14 DIAGNOSIS — Z992 Dependence on renal dialysis: Secondary | ICD-10-CM | POA: Diagnosis not present

## 2018-11-14 DIAGNOSIS — N186 End stage renal disease: Secondary | ICD-10-CM | POA: Diagnosis not present

## 2018-11-15 DIAGNOSIS — N186 End stage renal disease: Secondary | ICD-10-CM | POA: Diagnosis not present

## 2018-11-15 DIAGNOSIS — N2581 Secondary hyperparathyroidism of renal origin: Secondary | ICD-10-CM | POA: Diagnosis not present

## 2018-11-15 DIAGNOSIS — Z992 Dependence on renal dialysis: Secondary | ICD-10-CM | POA: Diagnosis not present

## 2018-11-16 DIAGNOSIS — N186 End stage renal disease: Secondary | ICD-10-CM | POA: Diagnosis not present

## 2018-11-16 DIAGNOSIS — N2581 Secondary hyperparathyroidism of renal origin: Secondary | ICD-10-CM | POA: Diagnosis not present

## 2018-11-16 DIAGNOSIS — Z992 Dependence on renal dialysis: Secondary | ICD-10-CM | POA: Diagnosis not present

## 2018-11-17 DIAGNOSIS — N2581 Secondary hyperparathyroidism of renal origin: Secondary | ICD-10-CM | POA: Diagnosis not present

## 2018-11-17 DIAGNOSIS — Z992 Dependence on renal dialysis: Secondary | ICD-10-CM | POA: Diagnosis not present

## 2018-11-17 DIAGNOSIS — N186 End stage renal disease: Secondary | ICD-10-CM | POA: Diagnosis not present

## 2018-11-18 DIAGNOSIS — N186 End stage renal disease: Secondary | ICD-10-CM | POA: Diagnosis not present

## 2018-11-18 DIAGNOSIS — Z992 Dependence on renal dialysis: Secondary | ICD-10-CM | POA: Diagnosis not present

## 2018-11-18 DIAGNOSIS — N2581 Secondary hyperparathyroidism of renal origin: Secondary | ICD-10-CM | POA: Diagnosis not present

## 2018-11-19 DIAGNOSIS — N186 End stage renal disease: Secondary | ICD-10-CM | POA: Diagnosis not present

## 2018-11-19 DIAGNOSIS — Z992 Dependence on renal dialysis: Secondary | ICD-10-CM | POA: Diagnosis not present

## 2018-11-19 DIAGNOSIS — N2581 Secondary hyperparathyroidism of renal origin: Secondary | ICD-10-CM | POA: Diagnosis not present

## 2018-11-20 DIAGNOSIS — N186 End stage renal disease: Secondary | ICD-10-CM | POA: Diagnosis not present

## 2018-11-20 DIAGNOSIS — N2581 Secondary hyperparathyroidism of renal origin: Secondary | ICD-10-CM | POA: Diagnosis not present

## 2018-11-20 DIAGNOSIS — I129 Hypertensive chronic kidney disease with stage 1 through stage 4 chronic kidney disease, or unspecified chronic kidney disease: Secondary | ICD-10-CM | POA: Diagnosis not present

## 2018-11-20 DIAGNOSIS — Z992 Dependence on renal dialysis: Secondary | ICD-10-CM | POA: Diagnosis not present

## 2018-11-21 DIAGNOSIS — N2581 Secondary hyperparathyroidism of renal origin: Secondary | ICD-10-CM | POA: Diagnosis not present

## 2018-11-21 DIAGNOSIS — N186 End stage renal disease: Secondary | ICD-10-CM | POA: Diagnosis not present

## 2018-11-21 DIAGNOSIS — Z992 Dependence on renal dialysis: Secondary | ICD-10-CM | POA: Diagnosis not present

## 2018-11-22 DIAGNOSIS — N2581 Secondary hyperparathyroidism of renal origin: Secondary | ICD-10-CM | POA: Diagnosis not present

## 2018-11-22 DIAGNOSIS — N186 End stage renal disease: Secondary | ICD-10-CM | POA: Diagnosis not present

## 2018-11-22 DIAGNOSIS — Z992 Dependence on renal dialysis: Secondary | ICD-10-CM | POA: Diagnosis not present

## 2018-11-23 DIAGNOSIS — N2581 Secondary hyperparathyroidism of renal origin: Secondary | ICD-10-CM | POA: Diagnosis not present

## 2018-11-23 DIAGNOSIS — Z992 Dependence on renal dialysis: Secondary | ICD-10-CM | POA: Diagnosis not present

## 2018-11-23 DIAGNOSIS — N186 End stage renal disease: Secondary | ICD-10-CM | POA: Diagnosis not present

## 2018-11-24 DIAGNOSIS — Z992 Dependence on renal dialysis: Secondary | ICD-10-CM | POA: Diagnosis not present

## 2018-11-24 DIAGNOSIS — N186 End stage renal disease: Secondary | ICD-10-CM | POA: Diagnosis not present

## 2018-11-24 DIAGNOSIS — N2581 Secondary hyperparathyroidism of renal origin: Secondary | ICD-10-CM | POA: Diagnosis not present

## 2018-11-25 DIAGNOSIS — N2581 Secondary hyperparathyroidism of renal origin: Secondary | ICD-10-CM | POA: Diagnosis not present

## 2018-11-25 DIAGNOSIS — N186 End stage renal disease: Secondary | ICD-10-CM | POA: Diagnosis not present

## 2018-11-25 DIAGNOSIS — Z992 Dependence on renal dialysis: Secondary | ICD-10-CM | POA: Diagnosis not present

## 2018-11-26 DIAGNOSIS — N2581 Secondary hyperparathyroidism of renal origin: Secondary | ICD-10-CM | POA: Diagnosis not present

## 2018-11-26 DIAGNOSIS — Z992 Dependence on renal dialysis: Secondary | ICD-10-CM | POA: Diagnosis not present

## 2018-11-26 DIAGNOSIS — N186 End stage renal disease: Secondary | ICD-10-CM | POA: Diagnosis not present

## 2018-11-27 DIAGNOSIS — N2581 Secondary hyperparathyroidism of renal origin: Secondary | ICD-10-CM | POA: Diagnosis not present

## 2018-11-27 DIAGNOSIS — N186 End stage renal disease: Secondary | ICD-10-CM | POA: Diagnosis not present

## 2018-11-27 DIAGNOSIS — Z992 Dependence on renal dialysis: Secondary | ICD-10-CM | POA: Diagnosis not present

## 2018-11-28 DIAGNOSIS — N2581 Secondary hyperparathyroidism of renal origin: Secondary | ICD-10-CM | POA: Diagnosis not present

## 2018-11-28 DIAGNOSIS — N186 End stage renal disease: Secondary | ICD-10-CM | POA: Diagnosis not present

## 2018-11-28 DIAGNOSIS — Z992 Dependence on renal dialysis: Secondary | ICD-10-CM | POA: Diagnosis not present

## 2018-11-29 DIAGNOSIS — N2581 Secondary hyperparathyroidism of renal origin: Secondary | ICD-10-CM | POA: Diagnosis not present

## 2018-11-29 DIAGNOSIS — Z992 Dependence on renal dialysis: Secondary | ICD-10-CM | POA: Diagnosis not present

## 2018-11-29 DIAGNOSIS — N186 End stage renal disease: Secondary | ICD-10-CM | POA: Diagnosis not present

## 2018-11-30 DIAGNOSIS — N186 End stage renal disease: Secondary | ICD-10-CM | POA: Diagnosis not present

## 2018-11-30 DIAGNOSIS — Z992 Dependence on renal dialysis: Secondary | ICD-10-CM | POA: Diagnosis not present

## 2018-11-30 DIAGNOSIS — N2581 Secondary hyperparathyroidism of renal origin: Secondary | ICD-10-CM | POA: Diagnosis not present

## 2018-12-01 DIAGNOSIS — I120 Hypertensive chronic kidney disease with stage 5 chronic kidney disease or end stage renal disease: Secondary | ICD-10-CM | POA: Diagnosis not present

## 2018-12-01 DIAGNOSIS — N186 End stage renal disease: Secondary | ICD-10-CM | POA: Diagnosis not present

## 2018-12-01 DIAGNOSIS — I158 Other secondary hypertension: Secondary | ICD-10-CM | POA: Diagnosis not present

## 2018-12-01 DIAGNOSIS — N2581 Secondary hyperparathyroidism of renal origin: Secondary | ICD-10-CM | POA: Diagnosis not present

## 2018-12-01 DIAGNOSIS — Z992 Dependence on renal dialysis: Secondary | ICD-10-CM | POA: Diagnosis not present

## 2018-12-01 DIAGNOSIS — Z01818 Encounter for other preprocedural examination: Secondary | ICD-10-CM | POA: Diagnosis not present

## 2018-12-01 DIAGNOSIS — Z87891 Personal history of nicotine dependence: Secondary | ICD-10-CM | POA: Diagnosis not present

## 2018-12-02 DIAGNOSIS — N2581 Secondary hyperparathyroidism of renal origin: Secondary | ICD-10-CM | POA: Diagnosis not present

## 2018-12-02 DIAGNOSIS — N186 End stage renal disease: Secondary | ICD-10-CM | POA: Diagnosis not present

## 2018-12-02 DIAGNOSIS — Z992 Dependence on renal dialysis: Secondary | ICD-10-CM | POA: Diagnosis not present

## 2018-12-03 DIAGNOSIS — R82998 Other abnormal findings in urine: Secondary | ICD-10-CM | POA: Diagnosis not present

## 2018-12-03 DIAGNOSIS — Z992 Dependence on renal dialysis: Secondary | ICD-10-CM | POA: Diagnosis not present

## 2018-12-03 DIAGNOSIS — N186 End stage renal disease: Secondary | ICD-10-CM | POA: Diagnosis not present

## 2018-12-03 DIAGNOSIS — N2581 Secondary hyperparathyroidism of renal origin: Secondary | ICD-10-CM | POA: Diagnosis not present

## 2018-12-03 DIAGNOSIS — Q613 Polycystic kidney, unspecified: Secondary | ICD-10-CM | POA: Diagnosis not present

## 2018-12-03 DIAGNOSIS — R338 Other retention of urine: Secondary | ICD-10-CM | POA: Diagnosis not present

## 2018-12-03 DIAGNOSIS — N9989 Other postprocedural complications and disorders of genitourinary system: Secondary | ICD-10-CM | POA: Diagnosis not present

## 2018-12-04 DIAGNOSIS — N2581 Secondary hyperparathyroidism of renal origin: Secondary | ICD-10-CM | POA: Diagnosis not present

## 2018-12-04 DIAGNOSIS — N186 End stage renal disease: Secondary | ICD-10-CM | POA: Diagnosis not present

## 2018-12-04 DIAGNOSIS — Z992 Dependence on renal dialysis: Secondary | ICD-10-CM | POA: Diagnosis not present

## 2018-12-05 DIAGNOSIS — N2581 Secondary hyperparathyroidism of renal origin: Secondary | ICD-10-CM | POA: Diagnosis not present

## 2018-12-05 DIAGNOSIS — Z992 Dependence on renal dialysis: Secondary | ICD-10-CM | POA: Diagnosis not present

## 2018-12-05 DIAGNOSIS — N186 End stage renal disease: Secondary | ICD-10-CM | POA: Diagnosis not present

## 2018-12-06 DIAGNOSIS — N2581 Secondary hyperparathyroidism of renal origin: Secondary | ICD-10-CM | POA: Diagnosis not present

## 2018-12-06 DIAGNOSIS — N186 End stage renal disease: Secondary | ICD-10-CM | POA: Diagnosis not present

## 2018-12-06 DIAGNOSIS — Z992 Dependence on renal dialysis: Secondary | ICD-10-CM | POA: Diagnosis not present

## 2018-12-07 DIAGNOSIS — N186 End stage renal disease: Secondary | ICD-10-CM | POA: Diagnosis not present

## 2018-12-07 DIAGNOSIS — Z992 Dependence on renal dialysis: Secondary | ICD-10-CM | POA: Diagnosis not present

## 2018-12-07 DIAGNOSIS — N2581 Secondary hyperparathyroidism of renal origin: Secondary | ICD-10-CM | POA: Diagnosis not present

## 2018-12-08 DIAGNOSIS — N2581 Secondary hyperparathyroidism of renal origin: Secondary | ICD-10-CM | POA: Diagnosis not present

## 2018-12-08 DIAGNOSIS — N186 End stage renal disease: Secondary | ICD-10-CM | POA: Diagnosis not present

## 2018-12-08 DIAGNOSIS — Z992 Dependence on renal dialysis: Secondary | ICD-10-CM | POA: Diagnosis not present

## 2018-12-09 DIAGNOSIS — N2581 Secondary hyperparathyroidism of renal origin: Secondary | ICD-10-CM | POA: Diagnosis not present

## 2018-12-09 DIAGNOSIS — N186 End stage renal disease: Secondary | ICD-10-CM | POA: Diagnosis not present

## 2018-12-09 DIAGNOSIS — Z992 Dependence on renal dialysis: Secondary | ICD-10-CM | POA: Diagnosis not present

## 2018-12-10 DIAGNOSIS — Z992 Dependence on renal dialysis: Secondary | ICD-10-CM | POA: Diagnosis not present

## 2018-12-10 DIAGNOSIS — N186 End stage renal disease: Secondary | ICD-10-CM | POA: Diagnosis not present

## 2018-12-10 DIAGNOSIS — N2581 Secondary hyperparathyroidism of renal origin: Secondary | ICD-10-CM | POA: Diagnosis not present

## 2018-12-11 DIAGNOSIS — Z992 Dependence on renal dialysis: Secondary | ICD-10-CM | POA: Diagnosis not present

## 2018-12-11 DIAGNOSIS — N2581 Secondary hyperparathyroidism of renal origin: Secondary | ICD-10-CM | POA: Diagnosis not present

## 2018-12-11 DIAGNOSIS — N186 End stage renal disease: Secondary | ICD-10-CM | POA: Diagnosis not present

## 2018-12-12 DIAGNOSIS — N186 End stage renal disease: Secondary | ICD-10-CM | POA: Diagnosis not present

## 2018-12-12 DIAGNOSIS — N2581 Secondary hyperparathyroidism of renal origin: Secondary | ICD-10-CM | POA: Diagnosis not present

## 2018-12-12 DIAGNOSIS — Z992 Dependence on renal dialysis: Secondary | ICD-10-CM | POA: Diagnosis not present

## 2018-12-13 DIAGNOSIS — N186 End stage renal disease: Secondary | ICD-10-CM | POA: Diagnosis not present

## 2018-12-13 DIAGNOSIS — Z992 Dependence on renal dialysis: Secondary | ICD-10-CM | POA: Diagnosis not present

## 2018-12-13 DIAGNOSIS — N2581 Secondary hyperparathyroidism of renal origin: Secondary | ICD-10-CM | POA: Diagnosis not present

## 2018-12-14 DIAGNOSIS — N186 End stage renal disease: Secondary | ICD-10-CM | POA: Diagnosis not present

## 2018-12-14 DIAGNOSIS — Z992 Dependence on renal dialysis: Secondary | ICD-10-CM | POA: Diagnosis not present

## 2018-12-14 DIAGNOSIS — N2581 Secondary hyperparathyroidism of renal origin: Secondary | ICD-10-CM | POA: Diagnosis not present

## 2018-12-15 DIAGNOSIS — N186 End stage renal disease: Secondary | ICD-10-CM | POA: Diagnosis not present

## 2018-12-15 DIAGNOSIS — Z992 Dependence on renal dialysis: Secondary | ICD-10-CM | POA: Diagnosis not present

## 2018-12-15 DIAGNOSIS — N2581 Secondary hyperparathyroidism of renal origin: Secondary | ICD-10-CM | POA: Diagnosis not present

## 2018-12-16 DIAGNOSIS — Z992 Dependence on renal dialysis: Secondary | ICD-10-CM | POA: Diagnosis not present

## 2018-12-16 DIAGNOSIS — N2581 Secondary hyperparathyroidism of renal origin: Secondary | ICD-10-CM | POA: Diagnosis not present

## 2018-12-16 DIAGNOSIS — N186 End stage renal disease: Secondary | ICD-10-CM | POA: Diagnosis not present

## 2018-12-17 DIAGNOSIS — N186 End stage renal disease: Secondary | ICD-10-CM | POA: Diagnosis not present

## 2018-12-17 DIAGNOSIS — Z992 Dependence on renal dialysis: Secondary | ICD-10-CM | POA: Diagnosis not present

## 2018-12-17 DIAGNOSIS — N2581 Secondary hyperparathyroidism of renal origin: Secondary | ICD-10-CM | POA: Diagnosis not present

## 2018-12-18 DIAGNOSIS — Z992 Dependence on renal dialysis: Secondary | ICD-10-CM | POA: Diagnosis not present

## 2018-12-18 DIAGNOSIS — N2581 Secondary hyperparathyroidism of renal origin: Secondary | ICD-10-CM | POA: Diagnosis not present

## 2018-12-18 DIAGNOSIS — N186 End stage renal disease: Secondary | ICD-10-CM | POA: Diagnosis not present

## 2018-12-19 DIAGNOSIS — N186 End stage renal disease: Secondary | ICD-10-CM | POA: Diagnosis not present

## 2018-12-19 DIAGNOSIS — Z992 Dependence on renal dialysis: Secondary | ICD-10-CM | POA: Diagnosis not present

## 2018-12-19 DIAGNOSIS — N2581 Secondary hyperparathyroidism of renal origin: Secondary | ICD-10-CM | POA: Diagnosis not present

## 2018-12-20 DIAGNOSIS — N186 End stage renal disease: Secondary | ICD-10-CM | POA: Diagnosis not present

## 2018-12-20 DIAGNOSIS — N2581 Secondary hyperparathyroidism of renal origin: Secondary | ICD-10-CM | POA: Diagnosis not present

## 2018-12-20 DIAGNOSIS — I129 Hypertensive chronic kidney disease with stage 1 through stage 4 chronic kidney disease, or unspecified chronic kidney disease: Secondary | ICD-10-CM | POA: Diagnosis not present

## 2018-12-20 DIAGNOSIS — Z992 Dependence on renal dialysis: Secondary | ICD-10-CM | POA: Diagnosis not present

## 2018-12-21 DIAGNOSIS — N2581 Secondary hyperparathyroidism of renal origin: Secondary | ICD-10-CM | POA: Diagnosis not present

## 2018-12-21 DIAGNOSIS — N186 End stage renal disease: Secondary | ICD-10-CM | POA: Diagnosis not present

## 2018-12-21 DIAGNOSIS — R399 Unspecified symptoms and signs involving the genitourinary system: Secondary | ICD-10-CM | POA: Diagnosis not present

## 2018-12-21 DIAGNOSIS — Q613 Polycystic kidney, unspecified: Secondary | ICD-10-CM | POA: Diagnosis not present

## 2018-12-21 DIAGNOSIS — Z992 Dependence on renal dialysis: Secondary | ICD-10-CM | POA: Diagnosis not present

## 2018-12-22 DIAGNOSIS — Z992 Dependence on renal dialysis: Secondary | ICD-10-CM | POA: Diagnosis not present

## 2018-12-22 DIAGNOSIS — N186 End stage renal disease: Secondary | ICD-10-CM | POA: Diagnosis not present

## 2018-12-22 DIAGNOSIS — N2581 Secondary hyperparathyroidism of renal origin: Secondary | ICD-10-CM | POA: Diagnosis not present

## 2018-12-23 DIAGNOSIS — Z992 Dependence on renal dialysis: Secondary | ICD-10-CM | POA: Diagnosis not present

## 2018-12-23 DIAGNOSIS — N186 End stage renal disease: Secondary | ICD-10-CM | POA: Diagnosis not present

## 2018-12-23 DIAGNOSIS — N2581 Secondary hyperparathyroidism of renal origin: Secondary | ICD-10-CM | POA: Diagnosis not present

## 2018-12-24 DIAGNOSIS — N2581 Secondary hyperparathyroidism of renal origin: Secondary | ICD-10-CM | POA: Diagnosis not present

## 2018-12-24 DIAGNOSIS — N186 End stage renal disease: Secondary | ICD-10-CM | POA: Diagnosis not present

## 2018-12-24 DIAGNOSIS — Z992 Dependence on renal dialysis: Secondary | ICD-10-CM | POA: Diagnosis not present

## 2018-12-25 DIAGNOSIS — N186 End stage renal disease: Secondary | ICD-10-CM | POA: Diagnosis not present

## 2018-12-25 DIAGNOSIS — N2581 Secondary hyperparathyroidism of renal origin: Secondary | ICD-10-CM | POA: Diagnosis not present

## 2018-12-25 DIAGNOSIS — Z992 Dependence on renal dialysis: Secondary | ICD-10-CM | POA: Diagnosis not present

## 2018-12-26 DIAGNOSIS — Z992 Dependence on renal dialysis: Secondary | ICD-10-CM | POA: Diagnosis not present

## 2018-12-26 DIAGNOSIS — N186 End stage renal disease: Secondary | ICD-10-CM | POA: Diagnosis not present

## 2018-12-26 DIAGNOSIS — N2581 Secondary hyperparathyroidism of renal origin: Secondary | ICD-10-CM | POA: Diagnosis not present

## 2018-12-27 DIAGNOSIS — N186 End stage renal disease: Secondary | ICD-10-CM | POA: Diagnosis not present

## 2018-12-27 DIAGNOSIS — Z992 Dependence on renal dialysis: Secondary | ICD-10-CM | POA: Diagnosis not present

## 2018-12-27 DIAGNOSIS — N2581 Secondary hyperparathyroidism of renal origin: Secondary | ICD-10-CM | POA: Diagnosis not present

## 2018-12-28 DIAGNOSIS — N186 End stage renal disease: Secondary | ICD-10-CM | POA: Diagnosis not present

## 2018-12-28 DIAGNOSIS — Z992 Dependence on renal dialysis: Secondary | ICD-10-CM | POA: Diagnosis not present

## 2018-12-28 DIAGNOSIS — N2581 Secondary hyperparathyroidism of renal origin: Secondary | ICD-10-CM | POA: Diagnosis not present

## 2018-12-29 DIAGNOSIS — N2581 Secondary hyperparathyroidism of renal origin: Secondary | ICD-10-CM | POA: Diagnosis not present

## 2018-12-29 DIAGNOSIS — N186 End stage renal disease: Secondary | ICD-10-CM | POA: Diagnosis not present

## 2018-12-29 DIAGNOSIS — Z992 Dependence on renal dialysis: Secondary | ICD-10-CM | POA: Diagnosis not present

## 2018-12-30 DIAGNOSIS — Z992 Dependence on renal dialysis: Secondary | ICD-10-CM | POA: Diagnosis not present

## 2018-12-30 DIAGNOSIS — N2581 Secondary hyperparathyroidism of renal origin: Secondary | ICD-10-CM | POA: Diagnosis not present

## 2018-12-30 DIAGNOSIS — N186 End stage renal disease: Secondary | ICD-10-CM | POA: Diagnosis not present

## 2018-12-31 DIAGNOSIS — N2581 Secondary hyperparathyroidism of renal origin: Secondary | ICD-10-CM | POA: Diagnosis not present

## 2018-12-31 DIAGNOSIS — Z992 Dependence on renal dialysis: Secondary | ICD-10-CM | POA: Diagnosis not present

## 2018-12-31 DIAGNOSIS — N186 End stage renal disease: Secondary | ICD-10-CM | POA: Diagnosis not present

## 2019-01-01 DIAGNOSIS — Z992 Dependence on renal dialysis: Secondary | ICD-10-CM | POA: Diagnosis not present

## 2019-01-01 DIAGNOSIS — N186 End stage renal disease: Secondary | ICD-10-CM | POA: Diagnosis not present

## 2019-01-01 DIAGNOSIS — N2581 Secondary hyperparathyroidism of renal origin: Secondary | ICD-10-CM | POA: Diagnosis not present

## 2019-01-02 DIAGNOSIS — Z992 Dependence on renal dialysis: Secondary | ICD-10-CM | POA: Diagnosis not present

## 2019-01-02 DIAGNOSIS — N2581 Secondary hyperparathyroidism of renal origin: Secondary | ICD-10-CM | POA: Diagnosis not present

## 2019-01-02 DIAGNOSIS — N186 End stage renal disease: Secondary | ICD-10-CM | POA: Diagnosis not present

## 2019-01-03 DIAGNOSIS — Z992 Dependence on renal dialysis: Secondary | ICD-10-CM | POA: Diagnosis not present

## 2019-01-03 DIAGNOSIS — N186 End stage renal disease: Secondary | ICD-10-CM | POA: Diagnosis not present

## 2019-01-03 DIAGNOSIS — N2581 Secondary hyperparathyroidism of renal origin: Secondary | ICD-10-CM | POA: Diagnosis not present

## 2019-01-04 DIAGNOSIS — N186 End stage renal disease: Secondary | ICD-10-CM | POA: Diagnosis not present

## 2019-01-04 DIAGNOSIS — N2581 Secondary hyperparathyroidism of renal origin: Secondary | ICD-10-CM | POA: Diagnosis not present

## 2019-01-04 DIAGNOSIS — Z992 Dependence on renal dialysis: Secondary | ICD-10-CM | POA: Diagnosis not present

## 2019-01-05 DIAGNOSIS — N186 End stage renal disease: Secondary | ICD-10-CM | POA: Diagnosis not present

## 2019-01-05 DIAGNOSIS — N2581 Secondary hyperparathyroidism of renal origin: Secondary | ICD-10-CM | POA: Diagnosis not present

## 2019-01-05 DIAGNOSIS — Z992 Dependence on renal dialysis: Secondary | ICD-10-CM | POA: Diagnosis not present

## 2019-01-06 DIAGNOSIS — Z992 Dependence on renal dialysis: Secondary | ICD-10-CM | POA: Diagnosis not present

## 2019-01-06 DIAGNOSIS — N2581 Secondary hyperparathyroidism of renal origin: Secondary | ICD-10-CM | POA: Diagnosis not present

## 2019-01-06 DIAGNOSIS — N186 End stage renal disease: Secondary | ICD-10-CM | POA: Diagnosis not present

## 2019-01-07 DIAGNOSIS — N186 End stage renal disease: Secondary | ICD-10-CM | POA: Diagnosis not present

## 2019-01-07 DIAGNOSIS — N2581 Secondary hyperparathyroidism of renal origin: Secondary | ICD-10-CM | POA: Diagnosis not present

## 2019-01-07 DIAGNOSIS — Z992 Dependence on renal dialysis: Secondary | ICD-10-CM | POA: Diagnosis not present

## 2019-01-07 DIAGNOSIS — Z87891 Personal history of nicotine dependence: Secondary | ICD-10-CM | POA: Diagnosis not present

## 2019-01-07 DIAGNOSIS — I158 Other secondary hypertension: Secondary | ICD-10-CM | POA: Diagnosis not present

## 2019-01-07 DIAGNOSIS — Z01818 Encounter for other preprocedural examination: Secondary | ICD-10-CM | POA: Diagnosis not present

## 2019-01-08 DIAGNOSIS — N2581 Secondary hyperparathyroidism of renal origin: Secondary | ICD-10-CM | POA: Diagnosis not present

## 2019-01-08 DIAGNOSIS — N186 End stage renal disease: Secondary | ICD-10-CM | POA: Diagnosis not present

## 2019-01-08 DIAGNOSIS — Z992 Dependence on renal dialysis: Secondary | ICD-10-CM | POA: Diagnosis not present

## 2019-01-09 DIAGNOSIS — N186 End stage renal disease: Secondary | ICD-10-CM | POA: Diagnosis not present

## 2019-01-09 DIAGNOSIS — Z992 Dependence on renal dialysis: Secondary | ICD-10-CM | POA: Diagnosis not present

## 2019-01-09 DIAGNOSIS — N2581 Secondary hyperparathyroidism of renal origin: Secondary | ICD-10-CM | POA: Diagnosis not present

## 2019-01-10 DIAGNOSIS — N2581 Secondary hyperparathyroidism of renal origin: Secondary | ICD-10-CM | POA: Diagnosis not present

## 2019-01-10 DIAGNOSIS — Z992 Dependence on renal dialysis: Secondary | ICD-10-CM | POA: Diagnosis not present

## 2019-01-10 DIAGNOSIS — N186 End stage renal disease: Secondary | ICD-10-CM | POA: Diagnosis not present

## 2019-01-11 DIAGNOSIS — N186 End stage renal disease: Secondary | ICD-10-CM | POA: Diagnosis not present

## 2019-01-11 DIAGNOSIS — N2581 Secondary hyperparathyroidism of renal origin: Secondary | ICD-10-CM | POA: Diagnosis not present

## 2019-01-11 DIAGNOSIS — Z992 Dependence on renal dialysis: Secondary | ICD-10-CM | POA: Diagnosis not present

## 2019-01-12 DIAGNOSIS — N186 End stage renal disease: Secondary | ICD-10-CM | POA: Diagnosis not present

## 2019-01-12 DIAGNOSIS — N2581 Secondary hyperparathyroidism of renal origin: Secondary | ICD-10-CM | POA: Diagnosis not present

## 2019-01-12 DIAGNOSIS — Z992 Dependence on renal dialysis: Secondary | ICD-10-CM | POA: Diagnosis not present

## 2019-01-13 DIAGNOSIS — N186 End stage renal disease: Secondary | ICD-10-CM | POA: Diagnosis not present

## 2019-01-13 DIAGNOSIS — Z992 Dependence on renal dialysis: Secondary | ICD-10-CM | POA: Diagnosis not present

## 2019-01-13 DIAGNOSIS — N2581 Secondary hyperparathyroidism of renal origin: Secondary | ICD-10-CM | POA: Diagnosis not present

## 2019-01-14 DIAGNOSIS — N2581 Secondary hyperparathyroidism of renal origin: Secondary | ICD-10-CM | POA: Diagnosis not present

## 2019-01-14 DIAGNOSIS — N186 End stage renal disease: Secondary | ICD-10-CM | POA: Diagnosis not present

## 2019-01-14 DIAGNOSIS — Z992 Dependence on renal dialysis: Secondary | ICD-10-CM | POA: Diagnosis not present

## 2019-01-15 DIAGNOSIS — Z992 Dependence on renal dialysis: Secondary | ICD-10-CM | POA: Diagnosis not present

## 2019-01-15 DIAGNOSIS — N2581 Secondary hyperparathyroidism of renal origin: Secondary | ICD-10-CM | POA: Diagnosis not present

## 2019-01-15 DIAGNOSIS — N186 End stage renal disease: Secondary | ICD-10-CM | POA: Diagnosis not present

## 2019-01-16 DIAGNOSIS — N2581 Secondary hyperparathyroidism of renal origin: Secondary | ICD-10-CM | POA: Diagnosis not present

## 2019-01-16 DIAGNOSIS — N186 End stage renal disease: Secondary | ICD-10-CM | POA: Diagnosis not present

## 2019-01-16 DIAGNOSIS — Z992 Dependence on renal dialysis: Secondary | ICD-10-CM | POA: Diagnosis not present

## 2019-01-17 DIAGNOSIS — N2581 Secondary hyperparathyroidism of renal origin: Secondary | ICD-10-CM | POA: Diagnosis not present

## 2019-01-17 DIAGNOSIS — Z992 Dependence on renal dialysis: Secondary | ICD-10-CM | POA: Diagnosis not present

## 2019-01-17 DIAGNOSIS — N186 End stage renal disease: Secondary | ICD-10-CM | POA: Diagnosis not present

## 2019-01-18 DIAGNOSIS — N390 Urinary tract infection, site not specified: Secondary | ICD-10-CM | POA: Diagnosis not present

## 2019-01-18 DIAGNOSIS — N952 Postmenopausal atrophic vaginitis: Secondary | ICD-10-CM | POA: Diagnosis not present

## 2019-01-18 DIAGNOSIS — N2581 Secondary hyperparathyroidism of renal origin: Secondary | ICD-10-CM | POA: Diagnosis not present

## 2019-01-18 DIAGNOSIS — Q613 Polycystic kidney, unspecified: Secondary | ICD-10-CM | POA: Diagnosis not present

## 2019-01-18 DIAGNOSIS — N186 End stage renal disease: Secondary | ICD-10-CM | POA: Diagnosis not present

## 2019-01-18 DIAGNOSIS — Z992 Dependence on renal dialysis: Secondary | ICD-10-CM | POA: Diagnosis not present

## 2019-01-19 DIAGNOSIS — Z992 Dependence on renal dialysis: Secondary | ICD-10-CM | POA: Diagnosis not present

## 2019-01-19 DIAGNOSIS — N2581 Secondary hyperparathyroidism of renal origin: Secondary | ICD-10-CM | POA: Diagnosis not present

## 2019-01-19 DIAGNOSIS — N186 End stage renal disease: Secondary | ICD-10-CM | POA: Diagnosis not present

## 2019-01-20 DIAGNOSIS — N2581 Secondary hyperparathyroidism of renal origin: Secondary | ICD-10-CM | POA: Diagnosis not present

## 2019-01-20 DIAGNOSIS — N186 End stage renal disease: Secondary | ICD-10-CM | POA: Diagnosis not present

## 2019-01-20 DIAGNOSIS — Z992 Dependence on renal dialysis: Secondary | ICD-10-CM | POA: Diagnosis not present

## 2019-01-20 DIAGNOSIS — I129 Hypertensive chronic kidney disease with stage 1 through stage 4 chronic kidney disease, or unspecified chronic kidney disease: Secondary | ICD-10-CM | POA: Diagnosis not present

## 2019-01-21 DIAGNOSIS — Z227 Latent tuberculosis: Secondary | ICD-10-CM

## 2019-01-21 DIAGNOSIS — N186 End stage renal disease: Secondary | ICD-10-CM | POA: Diagnosis not present

## 2019-01-21 DIAGNOSIS — N2581 Secondary hyperparathyroidism of renal origin: Secondary | ICD-10-CM | POA: Diagnosis not present

## 2019-01-21 DIAGNOSIS — Z992 Dependence on renal dialysis: Secondary | ICD-10-CM | POA: Diagnosis not present

## 2019-01-21 HISTORY — DX: Latent tuberculosis: Z22.7

## 2019-01-22 DIAGNOSIS — N186 End stage renal disease: Secondary | ICD-10-CM | POA: Diagnosis not present

## 2019-01-22 DIAGNOSIS — Z992 Dependence on renal dialysis: Secondary | ICD-10-CM | POA: Diagnosis not present

## 2019-01-22 DIAGNOSIS — N2581 Secondary hyperparathyroidism of renal origin: Secondary | ICD-10-CM | POA: Diagnosis not present

## 2019-01-23 DIAGNOSIS — N186 End stage renal disease: Secondary | ICD-10-CM | POA: Diagnosis not present

## 2019-01-23 DIAGNOSIS — Z992 Dependence on renal dialysis: Secondary | ICD-10-CM | POA: Diagnosis not present

## 2019-01-23 DIAGNOSIS — N2581 Secondary hyperparathyroidism of renal origin: Secondary | ICD-10-CM | POA: Diagnosis not present

## 2019-01-24 DIAGNOSIS — N186 End stage renal disease: Secondary | ICD-10-CM | POA: Diagnosis not present

## 2019-01-24 DIAGNOSIS — Z992 Dependence on renal dialysis: Secondary | ICD-10-CM | POA: Diagnosis not present

## 2019-01-24 DIAGNOSIS — N2581 Secondary hyperparathyroidism of renal origin: Secondary | ICD-10-CM | POA: Diagnosis not present

## 2019-01-25 ENCOUNTER — Emergency Department (HOSPITAL_COMMUNITY): Payer: BC Managed Care – PPO

## 2019-01-25 ENCOUNTER — Emergency Department (HOSPITAL_COMMUNITY)
Admission: EM | Admit: 2019-01-25 | Discharge: 2019-01-25 | Disposition: A | Discharge status: Home / Self Care | Payer: BC Managed Care – PPO | Attending: Emergency Medicine | Admitting: Emergency Medicine

## 2019-01-25 ENCOUNTER — Other Ambulatory Visit: Payer: Self-pay

## 2019-01-25 ENCOUNTER — Encounter (HOSPITAL_COMMUNITY): Payer: Self-pay | Admitting: *Deleted

## 2019-01-25 DIAGNOSIS — R1084 Generalized abdominal pain: Secondary | ICD-10-CM

## 2019-01-25 DIAGNOSIS — Z87891 Personal history of nicotine dependence: Secondary | ICD-10-CM | POA: Insufficient documentation

## 2019-01-25 DIAGNOSIS — R109 Unspecified abdominal pain: Secondary | ICD-10-CM | POA: Diagnosis not present

## 2019-01-25 DIAGNOSIS — I120 Hypertensive chronic kidney disease with stage 5 chronic kidney disease or end stage renal disease: Secondary | ICD-10-CM | POA: Diagnosis not present

## 2019-01-25 DIAGNOSIS — N186 End stage renal disease: Secondary | ICD-10-CM | POA: Diagnosis not present

## 2019-01-25 DIAGNOSIS — Z79899 Other long term (current) drug therapy: Secondary | ICD-10-CM | POA: Diagnosis not present

## 2019-01-25 DIAGNOSIS — Z992 Dependence on renal dialysis: Secondary | ICD-10-CM | POA: Insufficient documentation

## 2019-01-25 DIAGNOSIS — R52 Pain, unspecified: Secondary | ICD-10-CM | POA: Diagnosis not present

## 2019-01-25 DIAGNOSIS — R609 Edema, unspecified: Secondary | ICD-10-CM | POA: Diagnosis not present

## 2019-01-25 DIAGNOSIS — N2581 Secondary hyperparathyroidism of renal origin: Secondary | ICD-10-CM | POA: Diagnosis not present

## 2019-01-25 LAB — GLUCOSE, PLEURAL OR PERITONEAL FLUID: Glucose, Fluid: 174 mg/dL

## 2019-01-25 LAB — COMPREHENSIVE METABOLIC PANEL
ALT: 13 U/L (ref 0–44)
AST: 14 U/L — ABNORMAL LOW (ref 15–41)
Albumin: 3.3 g/dL — ABNORMAL LOW (ref 3.5–5.0)
Alkaline Phosphatase: 37 U/L — ABNORMAL LOW (ref 38–126)
Anion gap: 17 — ABNORMAL HIGH (ref 5–15)
BUN: 46 mg/dL — ABNORMAL HIGH (ref 6–20)
CO2: 22 mmol/L (ref 22–32)
Calcium: 9.2 mg/dL (ref 8.9–10.3)
Chloride: 101 mmol/L (ref 98–111)
Creatinine, Ser: 13.37 mg/dL — ABNORMAL HIGH (ref 0.44–1.00)
GFR calc Af Amer: 3 mL/min — ABNORMAL LOW (ref >60)
GFR calc non Af Amer: 3 mL/min — ABNORMAL LOW (ref >60)
Glucose, Bld: 94 mg/dL (ref 70–99)
Potassium: 4.2 mmol/L (ref 3.5–5.1)
Sodium: 140 mmol/L (ref 135–145)
Total Bilirubin: 0.4 mg/dL (ref 0.3–1.2)
Total Protein: 6.9 g/dL (ref 6.5–8.1)

## 2019-01-25 LAB — URINALYSIS, ROUTINE W REFLEX MICROSCOPIC
Bilirubin Urine: NEGATIVE
Glucose, UA: NEGATIVE mg/dL
Hgb urine dipstick: NEGATIVE
Ketones, ur: NEGATIVE mg/dL
Leukocytes,Ua: NEGATIVE
Nitrite: NEGATIVE
Protein, ur: 100 mg/dL — AB
Specific Gravity, Urine: 1.012 (ref 1.005–1.030)
pH: 7 (ref 5.0–8.0)

## 2019-01-25 LAB — LACTATE DEHYDROGENASE, PLEURAL OR PERITONEAL FLUID: LD, Fluid: 42 U/L — ABNORMAL HIGH (ref 3–23)

## 2019-01-25 LAB — CBC
HCT: 39 % (ref 36.0–46.0)
Hemoglobin: 12.1 g/dL (ref 12.0–15.0)
MCH: 33.2 pg (ref 26.0–34.0)
MCHC: 31 g/dL (ref 30.0–36.0)
MCV: 106.8 fL — ABNORMAL HIGH (ref 80.0–100.0)
Platelets: 235 10*3/uL (ref 150–400)
RBC: 3.65 MIL/uL — ABNORMAL LOW (ref 3.87–5.11)
RDW: 13 % (ref 11.5–15.5)
WBC: 6.7 10*3/uL (ref 4.0–10.5)
nRBC: 0 % (ref 0.0–0.2)

## 2019-01-25 LAB — ALBUMIN, PLEURAL OR PERITONEAL FLUID: Albumin, Fluid: <1 g/dL

## 2019-01-25 LAB — GRAM STAIN

## 2019-01-25 LAB — PROTEIN, PLEURAL OR PERITONEAL FLUID: Total protein, fluid: <3 g/dL

## 2019-01-25 LAB — LIPASE, BLOOD: Lipase: 55 U/L — ABNORMAL HIGH (ref 11–51)

## 2019-01-25 LAB — I-STAT BETA HCG BLOOD, ED (MC, WL, AP ONLY): I-stat hCG, quantitative: 5.1 m[IU]/mL — ABNORMAL HIGH (ref <5)

## 2019-01-25 MED ORDER — ONDANSETRON HCL 4 MG/2ML IJ SOLN
4.0000 mg | Freq: Once | INTRAMUSCULAR | Status: AC
  Administered 2019-01-25: 4 mg via INTRAVENOUS
  Filled 2019-01-25: qty 2

## 2019-01-25 MED ORDER — CIPROFLOXACIN HCL 500 MG PO TABS
500.0000 mg | ORAL_TABLET | Freq: Once | ORAL | Status: DC
Start: 2019-01-25 — End: 2019-01-26

## 2019-01-25 MED ORDER — CEFDINIR 300 MG PO CAPS: 300.0000 mg | ORAL_CAPSULE | Freq: Once | ORAL | Status: DC

## 2019-01-25 MED ORDER — HYDROCODONE-ACETAMINOPHEN 5-325 MG PO TABS: 1.0000 | ORAL_TABLET | ORAL | 0 refills | Status: DC | PRN

## 2019-01-25 MED ORDER — MORPHINE SULFATE (PF) 4 MG/ML IV SOLN
4.0000 mg | Freq: Once | INTRAVENOUS | Status: AC
  Administered 2019-01-25: 4 mg via INTRAVENOUS
  Filled 2019-01-25: qty 1

## 2019-01-25 MED ORDER — CIPROFLOXACIN HCL 500 MG PO TABS: 500.0000 mg | ORAL_TABLET | Freq: Every day | ORAL | 0 refills | Status: DC

## 2019-01-25 MED ORDER — SODIUM CHLORIDE 0.9% FLUSH: 3.0000 mL | Freq: Once | INTRAVENOUS | Status: DC

## 2019-01-25 NOTE — ED Notes (Signed)
Dialysis reports patient is unable to self collect sample from dialysis catheter due to it requiring  aseptic technique. They reported someone from dialysis will be down to collect a sample when they can.

## 2019-01-25 NOTE — ED Notes (Signed)
Called dialysis about obtaining supplies for patient to obtain peritoneal fluid. Dialysis reports they will call back about this when they have time.

## 2019-01-25 NOTE — Discharge Instructions (Signed)
Take cipro every day after dialysis.

## 2019-01-25 NOTE — ED Triage Notes (Signed)
Pt arrived by [facility] from work. Reports ongoing right side abd pain for several days, more severe this am. Pt received peritoneal dialysis every night. Has nausea, denies fever.
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
## 2019-01-26 ENCOUNTER — Telehealth: Payer: Self-pay | Admitting: *Deleted

## 2019-01-26 DIAGNOSIS — N186 End stage renal disease: Secondary | ICD-10-CM | POA: Diagnosis not present

## 2019-01-26 DIAGNOSIS — Z992 Dependence on renal dialysis: Secondary | ICD-10-CM | POA: Diagnosis not present

## 2019-01-26 DIAGNOSIS — N2581 Secondary hyperparathyroidism of renal origin: Secondary | ICD-10-CM | POA: Diagnosis not present

## 2019-01-26 LAB — URINE CULTURE: Culture: NO GROWTH

## 2019-01-26 NOTE — Telephone Encounter (Signed)
Pharmacy called regarding diagnosis for pt prescribed oxy ir.  EDCM reviewed the chart to find pt dx was abdominal pain r/t peritoneal dialysis.

## 2019-01-27 DIAGNOSIS — Z992 Dependence on renal dialysis: Secondary | ICD-10-CM | POA: Diagnosis not present

## 2019-01-27 DIAGNOSIS — N186 End stage renal disease: Secondary | ICD-10-CM | POA: Diagnosis not present

## 2019-01-27 DIAGNOSIS — N2581 Secondary hyperparathyroidism of renal origin: Secondary | ICD-10-CM | POA: Diagnosis not present

## 2019-01-28 DIAGNOSIS — Z992 Dependence on renal dialysis: Secondary | ICD-10-CM | POA: Diagnosis not present

## 2019-01-28 DIAGNOSIS — N186 End stage renal disease: Secondary | ICD-10-CM | POA: Diagnosis not present

## 2019-01-28 DIAGNOSIS — N2581 Secondary hyperparathyroidism of renal origin: Secondary | ICD-10-CM | POA: Diagnosis not present

## 2019-01-29 DIAGNOSIS — Z992 Dependence on renal dialysis: Secondary | ICD-10-CM | POA: Diagnosis not present

## 2019-01-29 DIAGNOSIS — N186 End stage renal disease: Secondary | ICD-10-CM | POA: Diagnosis not present

## 2019-01-29 DIAGNOSIS — N2581 Secondary hyperparathyroidism of renal origin: Secondary | ICD-10-CM | POA: Diagnosis not present

## 2019-01-30 DIAGNOSIS — N2581 Secondary hyperparathyroidism of renal origin: Secondary | ICD-10-CM | POA: Diagnosis not present

## 2019-01-30 DIAGNOSIS — N186 End stage renal disease: Secondary | ICD-10-CM | POA: Diagnosis not present

## 2019-01-30 DIAGNOSIS — Z992 Dependence on renal dialysis: Secondary | ICD-10-CM | POA: Diagnosis not present

## 2019-01-30 LAB — CULTURE, BODY FLUID W GRAM STAIN -BOTTLE: Culture: NO GROWTH

## 2019-01-31 DIAGNOSIS — N186 End stage renal disease: Secondary | ICD-10-CM | POA: Diagnosis not present

## 2019-01-31 DIAGNOSIS — N2581 Secondary hyperparathyroidism of renal origin: Secondary | ICD-10-CM | POA: Diagnosis not present

## 2019-01-31 DIAGNOSIS — Z992 Dependence on renal dialysis: Secondary | ICD-10-CM | POA: Diagnosis not present

## 2019-02-01 DIAGNOSIS — N2581 Secondary hyperparathyroidism of renal origin: Secondary | ICD-10-CM | POA: Diagnosis not present

## 2019-02-01 DIAGNOSIS — N186 End stage renal disease: Secondary | ICD-10-CM | POA: Diagnosis not present

## 2019-02-01 DIAGNOSIS — Z992 Dependence on renal dialysis: Secondary | ICD-10-CM | POA: Diagnosis not present

## 2019-02-02 DIAGNOSIS — Z992 Dependence on renal dialysis: Secondary | ICD-10-CM | POA: Diagnosis not present

## 2019-02-02 DIAGNOSIS — N2581 Secondary hyperparathyroidism of renal origin: Secondary | ICD-10-CM | POA: Diagnosis not present

## 2019-02-02 DIAGNOSIS — N186 End stage renal disease: Secondary | ICD-10-CM | POA: Diagnosis not present

## 2019-02-03 DIAGNOSIS — Z992 Dependence on renal dialysis: Secondary | ICD-10-CM | POA: Diagnosis not present

## 2019-02-03 DIAGNOSIS — Z01818 Encounter for other preprocedural examination: Secondary | ICD-10-CM | POA: Diagnosis not present

## 2019-02-03 DIAGNOSIS — N2581 Secondary hyperparathyroidism of renal origin: Secondary | ICD-10-CM | POA: Diagnosis not present

## 2019-02-03 DIAGNOSIS — N186 End stage renal disease: Secondary | ICD-10-CM | POA: Diagnosis not present

## 2019-02-03 DIAGNOSIS — R7612 Nonspecific reaction to cell mediated immunity measurement of gamma interferon antigen response without active tuberculosis: Secondary | ICD-10-CM | POA: Diagnosis not present

## 2019-02-04 DIAGNOSIS — N186 End stage renal disease: Secondary | ICD-10-CM | POA: Diagnosis not present

## 2019-02-04 DIAGNOSIS — N2581 Secondary hyperparathyroidism of renal origin: Secondary | ICD-10-CM | POA: Diagnosis not present

## 2019-02-04 DIAGNOSIS — Z992 Dependence on renal dialysis: Secondary | ICD-10-CM | POA: Diagnosis not present

## 2019-02-05 DIAGNOSIS — N2581 Secondary hyperparathyroidism of renal origin: Secondary | ICD-10-CM | POA: Diagnosis not present

## 2019-02-05 DIAGNOSIS — Z992 Dependence on renal dialysis: Secondary | ICD-10-CM | POA: Diagnosis not present

## 2019-02-05 DIAGNOSIS — N186 End stage renal disease: Secondary | ICD-10-CM | POA: Diagnosis not present

## 2019-02-06 DIAGNOSIS — N2581 Secondary hyperparathyroidism of renal origin: Secondary | ICD-10-CM | POA: Diagnosis not present

## 2019-02-06 DIAGNOSIS — N186 End stage renal disease: Secondary | ICD-10-CM | POA: Diagnosis not present

## 2019-02-06 DIAGNOSIS — Z992 Dependence on renal dialysis: Secondary | ICD-10-CM | POA: Diagnosis not present

## 2019-02-07 DIAGNOSIS — N186 End stage renal disease: Secondary | ICD-10-CM | POA: Diagnosis not present

## 2019-02-07 DIAGNOSIS — N2581 Secondary hyperparathyroidism of renal origin: Secondary | ICD-10-CM | POA: Diagnosis not present

## 2019-02-07 DIAGNOSIS — Z992 Dependence on renal dialysis: Secondary | ICD-10-CM | POA: Diagnosis not present

## 2019-02-08 DIAGNOSIS — N186 End stage renal disease: Secondary | ICD-10-CM | POA: Diagnosis not present

## 2019-02-08 DIAGNOSIS — Z992 Dependence on renal dialysis: Secondary | ICD-10-CM | POA: Diagnosis not present

## 2019-02-08 DIAGNOSIS — N2581 Secondary hyperparathyroidism of renal origin: Secondary | ICD-10-CM | POA: Diagnosis not present

## 2019-02-09 ENCOUNTER — Other Ambulatory Visit: Payer: Self-pay | Admitting: Internal Medicine

## 2019-02-09 DIAGNOSIS — Q613 Polycystic kidney, unspecified: Secondary | ICD-10-CM

## 2019-02-09 DIAGNOSIS — Z992 Dependence on renal dialysis: Secondary | ICD-10-CM | POA: Diagnosis not present

## 2019-02-09 DIAGNOSIS — N2581 Secondary hyperparathyroidism of renal origin: Secondary | ICD-10-CM | POA: Diagnosis not present

## 2019-02-09 DIAGNOSIS — N186 End stage renal disease: Secondary | ICD-10-CM | POA: Diagnosis not present

## 2019-02-10 DIAGNOSIS — N2581 Secondary hyperparathyroidism of renal origin: Secondary | ICD-10-CM | POA: Diagnosis not present

## 2019-02-10 DIAGNOSIS — N186 End stage renal disease: Secondary | ICD-10-CM | POA: Diagnosis not present

## 2019-02-10 DIAGNOSIS — Z992 Dependence on renal dialysis: Secondary | ICD-10-CM | POA: Diagnosis not present

## 2019-02-11 DIAGNOSIS — Z992 Dependence on renal dialysis: Secondary | ICD-10-CM | POA: Diagnosis not present

## 2019-02-11 DIAGNOSIS — N2581 Secondary hyperparathyroidism of renal origin: Secondary | ICD-10-CM | POA: Diagnosis not present

## 2019-02-11 DIAGNOSIS — N186 End stage renal disease: Secondary | ICD-10-CM | POA: Diagnosis not present

## 2019-02-12 DIAGNOSIS — Z992 Dependence on renal dialysis: Secondary | ICD-10-CM | POA: Diagnosis not present

## 2019-02-12 DIAGNOSIS — N186 End stage renal disease: Secondary | ICD-10-CM | POA: Diagnosis not present

## 2019-02-12 DIAGNOSIS — N2581 Secondary hyperparathyroidism of renal origin: Secondary | ICD-10-CM | POA: Diagnosis not present

## 2019-02-13 DIAGNOSIS — N2581 Secondary hyperparathyroidism of renal origin: Secondary | ICD-10-CM | POA: Diagnosis not present

## 2019-02-13 DIAGNOSIS — Z992 Dependence on renal dialysis: Secondary | ICD-10-CM | POA: Diagnosis not present

## 2019-02-13 DIAGNOSIS — N186 End stage renal disease: Secondary | ICD-10-CM | POA: Diagnosis not present

## 2019-02-14 DIAGNOSIS — Z992 Dependence on renal dialysis: Secondary | ICD-10-CM | POA: Diagnosis not present

## 2019-02-14 DIAGNOSIS — N186 End stage renal disease: Secondary | ICD-10-CM | POA: Diagnosis not present

## 2019-02-14 DIAGNOSIS — N2581 Secondary hyperparathyroidism of renal origin: Secondary | ICD-10-CM | POA: Diagnosis not present

## 2019-02-15 DIAGNOSIS — N186 End stage renal disease: Secondary | ICD-10-CM | POA: Diagnosis not present

## 2019-02-15 DIAGNOSIS — N2581 Secondary hyperparathyroidism of renal origin: Secondary | ICD-10-CM | POA: Diagnosis not present

## 2019-02-15 DIAGNOSIS — Z992 Dependence on renal dialysis: Secondary | ICD-10-CM | POA: Diagnosis not present

## 2019-02-16 DIAGNOSIS — Z992 Dependence on renal dialysis: Secondary | ICD-10-CM | POA: Diagnosis not present

## 2019-02-16 DIAGNOSIS — N186 End stage renal disease: Secondary | ICD-10-CM | POA: Diagnosis not present

## 2019-02-16 DIAGNOSIS — N2581 Secondary hyperparathyroidism of renal origin: Secondary | ICD-10-CM | POA: Diagnosis not present

## 2019-02-17 DIAGNOSIS — Z992 Dependence on renal dialysis: Secondary | ICD-10-CM | POA: Diagnosis not present

## 2019-02-17 DIAGNOSIS — N186 End stage renal disease: Secondary | ICD-10-CM | POA: Diagnosis not present

## 2019-02-17 DIAGNOSIS — N2581 Secondary hyperparathyroidism of renal origin: Secondary | ICD-10-CM | POA: Diagnosis not present

## 2019-02-18 DIAGNOSIS — N2581 Secondary hyperparathyroidism of renal origin: Secondary | ICD-10-CM | POA: Diagnosis not present

## 2019-02-18 DIAGNOSIS — N186 End stage renal disease: Secondary | ICD-10-CM | POA: Diagnosis not present

## 2019-02-18 DIAGNOSIS — Z992 Dependence on renal dialysis: Secondary | ICD-10-CM | POA: Diagnosis not present

## 2019-02-19 DIAGNOSIS — N2581 Secondary hyperparathyroidism of renal origin: Secondary | ICD-10-CM | POA: Diagnosis not present

## 2019-02-19 DIAGNOSIS — Z992 Dependence on renal dialysis: Secondary | ICD-10-CM | POA: Diagnosis not present

## 2019-02-19 DIAGNOSIS — N186 End stage renal disease: Secondary | ICD-10-CM | POA: Diagnosis not present

## 2019-02-20 DIAGNOSIS — Z992 Dependence on renal dialysis: Secondary | ICD-10-CM | POA: Diagnosis not present

## 2019-02-20 DIAGNOSIS — N186 End stage renal disease: Secondary | ICD-10-CM | POA: Diagnosis not present

## 2019-02-20 DIAGNOSIS — N2581 Secondary hyperparathyroidism of renal origin: Secondary | ICD-10-CM | POA: Diagnosis not present

## 2019-02-20 DIAGNOSIS — I129 Hypertensive chronic kidney disease with stage 1 through stage 4 chronic kidney disease, or unspecified chronic kidney disease: Secondary | ICD-10-CM | POA: Diagnosis not present

## 2019-02-21 DIAGNOSIS — N2581 Secondary hyperparathyroidism of renal origin: Secondary | ICD-10-CM | POA: Diagnosis not present

## 2019-02-21 DIAGNOSIS — Z992 Dependence on renal dialysis: Secondary | ICD-10-CM | POA: Diagnosis not present

## 2019-02-21 DIAGNOSIS — N186 End stage renal disease: Secondary | ICD-10-CM | POA: Diagnosis not present

## 2019-02-22 DIAGNOSIS — N2581 Secondary hyperparathyroidism of renal origin: Secondary | ICD-10-CM | POA: Diagnosis not present

## 2019-02-22 DIAGNOSIS — Z992 Dependence on renal dialysis: Secondary | ICD-10-CM | POA: Diagnosis not present

## 2019-02-22 DIAGNOSIS — N186 End stage renal disease: Secondary | ICD-10-CM | POA: Diagnosis not present

## 2019-02-23 DIAGNOSIS — N186 End stage renal disease: Secondary | ICD-10-CM | POA: Diagnosis not present

## 2019-02-23 DIAGNOSIS — N2581 Secondary hyperparathyroidism of renal origin: Secondary | ICD-10-CM | POA: Diagnosis not present

## 2019-02-23 DIAGNOSIS — Z992 Dependence on renal dialysis: Secondary | ICD-10-CM | POA: Diagnosis not present

## 2019-02-24 DIAGNOSIS — Z992 Dependence on renal dialysis: Secondary | ICD-10-CM | POA: Diagnosis not present

## 2019-02-24 DIAGNOSIS — N2581 Secondary hyperparathyroidism of renal origin: Secondary | ICD-10-CM | POA: Diagnosis not present

## 2019-02-24 DIAGNOSIS — N186 End stage renal disease: Secondary | ICD-10-CM | POA: Diagnosis not present

## 2019-02-25 DIAGNOSIS — N186 End stage renal disease: Secondary | ICD-10-CM | POA: Diagnosis not present

## 2019-02-25 DIAGNOSIS — Z992 Dependence on renal dialysis: Secondary | ICD-10-CM | POA: Diagnosis not present

## 2019-02-25 DIAGNOSIS — N2581 Secondary hyperparathyroidism of renal origin: Secondary | ICD-10-CM | POA: Diagnosis not present

## 2019-02-26 DIAGNOSIS — N2581 Secondary hyperparathyroidism of renal origin: Secondary | ICD-10-CM | POA: Diagnosis not present

## 2019-02-26 DIAGNOSIS — Z992 Dependence on renal dialysis: Secondary | ICD-10-CM | POA: Diagnosis not present

## 2019-02-26 DIAGNOSIS — N186 End stage renal disease: Secondary | ICD-10-CM | POA: Diagnosis not present

## 2019-02-27 DIAGNOSIS — N2581 Secondary hyperparathyroidism of renal origin: Secondary | ICD-10-CM | POA: Diagnosis not present

## 2019-02-27 DIAGNOSIS — Z992 Dependence on renal dialysis: Secondary | ICD-10-CM | POA: Diagnosis not present

## 2019-02-27 DIAGNOSIS — N186 End stage renal disease: Secondary | ICD-10-CM | POA: Diagnosis not present

## 2019-02-28 DIAGNOSIS — Z992 Dependence on renal dialysis: Secondary | ICD-10-CM | POA: Diagnosis not present

## 2019-02-28 DIAGNOSIS — N2581 Secondary hyperparathyroidism of renal origin: Secondary | ICD-10-CM | POA: Diagnosis not present

## 2019-02-28 DIAGNOSIS — N186 End stage renal disease: Secondary | ICD-10-CM | POA: Diagnosis not present

## 2019-03-01 DIAGNOSIS — N186 End stage renal disease: Secondary | ICD-10-CM | POA: Diagnosis not present

## 2019-03-01 DIAGNOSIS — Z992 Dependence on renal dialysis: Secondary | ICD-10-CM | POA: Diagnosis not present

## 2019-03-01 DIAGNOSIS — N2581 Secondary hyperparathyroidism of renal origin: Secondary | ICD-10-CM | POA: Diagnosis not present

## 2019-03-02 DIAGNOSIS — N2581 Secondary hyperparathyroidism of renal origin: Secondary | ICD-10-CM | POA: Diagnosis not present

## 2019-03-02 DIAGNOSIS — N186 End stage renal disease: Secondary | ICD-10-CM | POA: Diagnosis not present

## 2019-03-02 DIAGNOSIS — Z992 Dependence on renal dialysis: Secondary | ICD-10-CM | POA: Diagnosis not present

## 2019-03-03 DIAGNOSIS — N186 End stage renal disease: Secondary | ICD-10-CM | POA: Diagnosis not present

## 2019-03-03 DIAGNOSIS — N2581 Secondary hyperparathyroidism of renal origin: Secondary | ICD-10-CM | POA: Diagnosis not present

## 2019-03-03 DIAGNOSIS — Z992 Dependence on renal dialysis: Secondary | ICD-10-CM | POA: Diagnosis not present

## 2019-03-04 DIAGNOSIS — Z992 Dependence on renal dialysis: Secondary | ICD-10-CM | POA: Diagnosis not present

## 2019-03-04 DIAGNOSIS — R399 Unspecified symptoms and signs involving the genitourinary system: Secondary | ICD-10-CM | POA: Diagnosis not present

## 2019-03-04 DIAGNOSIS — N2581 Secondary hyperparathyroidism of renal origin: Secondary | ICD-10-CM | POA: Diagnosis not present

## 2019-03-04 DIAGNOSIS — N186 End stage renal disease: Secondary | ICD-10-CM | POA: Diagnosis not present

## 2019-03-05 DIAGNOSIS — N2581 Secondary hyperparathyroidism of renal origin: Secondary | ICD-10-CM | POA: Diagnosis not present

## 2019-03-05 DIAGNOSIS — Z992 Dependence on renal dialysis: Secondary | ICD-10-CM | POA: Diagnosis not present

## 2019-03-05 DIAGNOSIS — N186 End stage renal disease: Secondary | ICD-10-CM | POA: Diagnosis not present

## 2019-03-06 DIAGNOSIS — N186 End stage renal disease: Secondary | ICD-10-CM | POA: Diagnosis not present

## 2019-03-06 DIAGNOSIS — N2581 Secondary hyperparathyroidism of renal origin: Secondary | ICD-10-CM | POA: Diagnosis not present

## 2019-03-06 DIAGNOSIS — Z992 Dependence on renal dialysis: Secondary | ICD-10-CM | POA: Diagnosis not present

## 2019-03-07 DIAGNOSIS — N2581 Secondary hyperparathyroidism of renal origin: Secondary | ICD-10-CM | POA: Diagnosis not present

## 2019-03-07 DIAGNOSIS — N186 End stage renal disease: Secondary | ICD-10-CM | POA: Diagnosis not present

## 2019-03-07 DIAGNOSIS — Z992 Dependence on renal dialysis: Secondary | ICD-10-CM | POA: Diagnosis not present

## 2019-03-08 DIAGNOSIS — N186 End stage renal disease: Secondary | ICD-10-CM | POA: Diagnosis not present

## 2019-03-08 DIAGNOSIS — Z992 Dependence on renal dialysis: Secondary | ICD-10-CM | POA: Diagnosis not present

## 2019-03-08 DIAGNOSIS — N2581 Secondary hyperparathyroidism of renal origin: Secondary | ICD-10-CM | POA: Diagnosis not present

## 2019-03-09 DIAGNOSIS — N186 End stage renal disease: Secondary | ICD-10-CM | POA: Diagnosis not present

## 2019-03-09 DIAGNOSIS — N2581 Secondary hyperparathyroidism of renal origin: Secondary | ICD-10-CM | POA: Diagnosis not present

## 2019-03-09 DIAGNOSIS — Z992 Dependence on renal dialysis: Secondary | ICD-10-CM | POA: Diagnosis not present

## 2019-03-10 DIAGNOSIS — N186 End stage renal disease: Secondary | ICD-10-CM | POA: Diagnosis not present

## 2019-03-10 DIAGNOSIS — Z992 Dependence on renal dialysis: Secondary | ICD-10-CM | POA: Diagnosis not present

## 2019-03-10 DIAGNOSIS — N2581 Secondary hyperparathyroidism of renal origin: Secondary | ICD-10-CM | POA: Diagnosis not present

## 2019-03-11 DIAGNOSIS — Z992 Dependence on renal dialysis: Secondary | ICD-10-CM | POA: Diagnosis not present

## 2019-03-11 DIAGNOSIS — N2581 Secondary hyperparathyroidism of renal origin: Secondary | ICD-10-CM | POA: Diagnosis not present

## 2019-03-11 DIAGNOSIS — N186 End stage renal disease: Secondary | ICD-10-CM | POA: Diagnosis not present

## 2019-03-12 DIAGNOSIS — N2581 Secondary hyperparathyroidism of renal origin: Secondary | ICD-10-CM | POA: Diagnosis not present

## 2019-03-12 DIAGNOSIS — Z992 Dependence on renal dialysis: Secondary | ICD-10-CM | POA: Diagnosis not present

## 2019-03-12 DIAGNOSIS — N186 End stage renal disease: Secondary | ICD-10-CM | POA: Diagnosis not present

## 2019-03-13 DIAGNOSIS — N2581 Secondary hyperparathyroidism of renal origin: Secondary | ICD-10-CM | POA: Diagnosis not present

## 2019-03-13 DIAGNOSIS — Z992 Dependence on renal dialysis: Secondary | ICD-10-CM | POA: Diagnosis not present

## 2019-03-13 DIAGNOSIS — N186 End stage renal disease: Secondary | ICD-10-CM | POA: Diagnosis not present

## 2019-03-14 DIAGNOSIS — Z992 Dependence on renal dialysis: Secondary | ICD-10-CM | POA: Diagnosis not present

## 2019-03-14 DIAGNOSIS — N186 End stage renal disease: Secondary | ICD-10-CM | POA: Diagnosis not present

## 2019-03-14 DIAGNOSIS — N2581 Secondary hyperparathyroidism of renal origin: Secondary | ICD-10-CM | POA: Diagnosis not present

## 2019-03-15 DIAGNOSIS — Z87891 Personal history of nicotine dependence: Secondary | ICD-10-CM | POA: Diagnosis not present

## 2019-03-15 DIAGNOSIS — Z8249 Family history of ischemic heart disease and other diseases of the circulatory system: Secondary | ICD-10-CM | POA: Diagnosis not present

## 2019-03-15 DIAGNOSIS — Z992 Dependence on renal dialysis: Secondary | ICD-10-CM | POA: Diagnosis not present

## 2019-03-15 DIAGNOSIS — I120 Hypertensive chronic kidney disease with stage 5 chronic kidney disease or end stage renal disease: Secondary | ICD-10-CM | POA: Diagnosis not present

## 2019-03-15 DIAGNOSIS — N2581 Secondary hyperparathyroidism of renal origin: Secondary | ICD-10-CM | POA: Diagnosis not present

## 2019-03-15 DIAGNOSIS — Z940 Kidney transplant status: Secondary | ICD-10-CM | POA: Diagnosis not present

## 2019-03-15 DIAGNOSIS — Z4822 Encounter for aftercare following kidney transplant: Secondary | ICD-10-CM | POA: Diagnosis not present

## 2019-03-15 DIAGNOSIS — E875 Hyperkalemia: Secondary | ICD-10-CM | POA: Diagnosis not present

## 2019-03-15 DIAGNOSIS — Z841 Family history of disorders of kidney and ureter: Secondary | ICD-10-CM | POA: Diagnosis not present

## 2019-03-15 DIAGNOSIS — D631 Anemia in chronic kidney disease: Secondary | ICD-10-CM | POA: Diagnosis not present

## 2019-03-15 DIAGNOSIS — Z4902 Encounter for fitting and adjustment of peritoneal dialysis catheter: Secondary | ICD-10-CM | POA: Diagnosis not present

## 2019-03-15 DIAGNOSIS — N186 End stage renal disease: Secondary | ICD-10-CM | POA: Diagnosis not present

## 2019-03-15 DIAGNOSIS — R001 Bradycardia, unspecified: Secondary | ICD-10-CM | POA: Diagnosis not present

## 2019-03-15 DIAGNOSIS — Z01818 Encounter for other preprocedural examination: Secondary | ICD-10-CM | POA: Diagnosis not present

## 2019-03-15 DIAGNOSIS — Z8271 Family history of polycystic kidney: Secondary | ICD-10-CM | POA: Diagnosis not present

## 2019-03-15 DIAGNOSIS — Z79899 Other long term (current) drug therapy: Secondary | ICD-10-CM | POA: Diagnosis not present

## 2019-03-15 DIAGNOSIS — Z7952 Long term (current) use of systemic steroids: Secondary | ICD-10-CM | POA: Diagnosis not present

## 2019-03-15 DIAGNOSIS — G4733 Obstructive sleep apnea (adult) (pediatric): Secondary | ICD-10-CM | POA: Diagnosis not present

## 2019-03-15 DIAGNOSIS — E785 Hyperlipidemia, unspecified: Secondary | ICD-10-CM | POA: Diagnosis not present

## 2019-03-15 DIAGNOSIS — Q613 Polycystic kidney, unspecified: Secondary | ICD-10-CM | POA: Diagnosis not present

## 2019-03-15 DIAGNOSIS — T8619 Other complication of kidney transplant: Secondary | ICD-10-CM | POA: Diagnosis not present

## 2019-03-15 DIAGNOSIS — I10 Essential (primary) hypertension: Secondary | ICD-10-CM | POA: Diagnosis not present

## 2019-03-15 DIAGNOSIS — Z5181 Encounter for therapeutic drug level monitoring: Secondary | ICD-10-CM | POA: Diagnosis not present

## 2019-03-15 DIAGNOSIS — Z87440 Personal history of urinary (tract) infections: Secondary | ICD-10-CM | POA: Diagnosis not present

## 2019-03-15 HISTORY — PX: NEPHRECTOMY TRANSPLANTED ORGAN: SUR880

## 2019-03-15 HISTORY — DX: Kidney transplant status: Z94.0

## 2019-03-16 DIAGNOSIS — Z4822 Encounter for aftercare following kidney transplant: Secondary | ICD-10-CM | POA: Diagnosis not present

## 2019-03-16 DIAGNOSIS — Z79899 Other long term (current) drug therapy: Secondary | ICD-10-CM | POA: Diagnosis not present

## 2019-03-16 DIAGNOSIS — R001 Bradycardia, unspecified: Secondary | ICD-10-CM | POA: Diagnosis not present

## 2019-03-16 DIAGNOSIS — E875 Hyperkalemia: Secondary | ICD-10-CM | POA: Diagnosis not present

## 2019-03-16 DIAGNOSIS — Z5181 Encounter for therapeutic drug level monitoring: Secondary | ICD-10-CM | POA: Diagnosis not present

## 2019-03-16 DIAGNOSIS — N186 End stage renal disease: Secondary | ICD-10-CM | POA: Diagnosis not present

## 2019-03-16 DIAGNOSIS — I120 Hypertensive chronic kidney disease with stage 5 chronic kidney disease or end stage renal disease: Secondary | ICD-10-CM | POA: Diagnosis not present

## 2019-03-16 DIAGNOSIS — Z940 Kidney transplant status: Secondary | ICD-10-CM | POA: Diagnosis not present

## 2019-03-16 DIAGNOSIS — T8619 Other complication of kidney transplant: Secondary | ICD-10-CM | POA: Diagnosis not present

## 2019-03-17 DIAGNOSIS — I10 Essential (primary) hypertension: Secondary | ICD-10-CM | POA: Diagnosis not present

## 2019-03-17 DIAGNOSIS — Z7952 Long term (current) use of systemic steroids: Secondary | ICD-10-CM | POA: Diagnosis not present

## 2019-03-17 DIAGNOSIS — Z79899 Other long term (current) drug therapy: Secondary | ICD-10-CM | POA: Diagnosis not present

## 2019-03-17 DIAGNOSIS — Z992 Dependence on renal dialysis: Secondary | ICD-10-CM | POA: Diagnosis not present

## 2019-03-17 DIAGNOSIS — Z4822 Encounter for aftercare following kidney transplant: Secondary | ICD-10-CM | POA: Diagnosis not present

## 2019-03-17 DIAGNOSIS — N186 End stage renal disease: Secondary | ICD-10-CM | POA: Diagnosis not present

## 2019-03-17 DIAGNOSIS — Z940 Kidney transplant status: Secondary | ICD-10-CM | POA: Diagnosis not present

## 2019-03-17 DIAGNOSIS — T8619 Other complication of kidney transplant: Secondary | ICD-10-CM | POA: Diagnosis not present

## 2019-03-17 DIAGNOSIS — Z5181 Encounter for therapeutic drug level monitoring: Secondary | ICD-10-CM | POA: Diagnosis not present

## 2019-03-20 DIAGNOSIS — N186 End stage renal disease: Secondary | ICD-10-CM | POA: Diagnosis not present

## 2019-03-20 DIAGNOSIS — I129 Hypertensive chronic kidney disease with stage 1 through stage 4 chronic kidney disease, or unspecified chronic kidney disease: Secondary | ICD-10-CM | POA: Diagnosis not present

## 2019-03-20 DIAGNOSIS — Z992 Dependence on renal dialysis: Secondary | ICD-10-CM | POA: Diagnosis not present

## 2019-03-21 DIAGNOSIS — Z466 Encounter for fitting and adjustment of urinary device: Secondary | ICD-10-CM | POA: Diagnosis not present

## 2019-03-21 DIAGNOSIS — Z792 Long term (current) use of antibiotics: Secondary | ICD-10-CM | POA: Diagnosis not present

## 2019-03-21 DIAGNOSIS — D649 Anemia, unspecified: Secondary | ICD-10-CM | POA: Diagnosis not present

## 2019-03-21 DIAGNOSIS — Z940 Kidney transplant status: Secondary | ICD-10-CM | POA: Diagnosis not present

## 2019-03-21 DIAGNOSIS — Z7952 Long term (current) use of systemic steroids: Secondary | ICD-10-CM | POA: Diagnosis not present

## 2019-03-21 DIAGNOSIS — Z87440 Personal history of urinary (tract) infections: Secondary | ICD-10-CM | POA: Diagnosis not present

## 2019-03-21 DIAGNOSIS — E785 Hyperlipidemia, unspecified: Secondary | ICD-10-CM | POA: Diagnosis not present

## 2019-03-21 DIAGNOSIS — Z4803 Encounter for change or removal of drains: Secondary | ICD-10-CM | POA: Diagnosis not present

## 2019-03-21 DIAGNOSIS — Z227 Latent tuberculosis: Secondary | ICD-10-CM | POA: Diagnosis not present

## 2019-03-21 DIAGNOSIS — Z79899 Other long term (current) drug therapy: Secondary | ICD-10-CM | POA: Diagnosis not present

## 2019-03-21 DIAGNOSIS — Z4822 Encounter for aftercare following kidney transplant: Secondary | ICD-10-CM | POA: Diagnosis not present

## 2019-03-21 DIAGNOSIS — R030 Elevated blood-pressure reading, without diagnosis of hypertension: Secondary | ICD-10-CM | POA: Diagnosis not present

## 2019-03-21 DIAGNOSIS — R110 Nausea: Secondary | ICD-10-CM | POA: Diagnosis not present

## 2019-03-21 DIAGNOSIS — T8619 Other complication of kidney transplant: Secondary | ICD-10-CM | POA: Diagnosis not present

## 2019-03-23 ENCOUNTER — Encounter: Payer: BC Managed Care – PPO | Admitting: Internal Medicine

## 2019-03-23 DIAGNOSIS — Z4822 Encounter for aftercare following kidney transplant: Secondary | ICD-10-CM | POA: Diagnosis not present

## 2019-03-23 DIAGNOSIS — Z940 Kidney transplant status: Secondary | ICD-10-CM | POA: Insufficient documentation

## 2019-03-23 DIAGNOSIS — I120 Hypertensive chronic kidney disease with stage 5 chronic kidney disease or end stage renal disease: Secondary | ICD-10-CM | POA: Diagnosis not present

## 2019-03-23 DIAGNOSIS — X58XXXA Exposure to other specified factors, initial encounter: Secondary | ICD-10-CM | POA: Diagnosis not present

## 2019-03-23 DIAGNOSIS — Z87891 Personal history of nicotine dependence: Secondary | ICD-10-CM | POA: Diagnosis not present

## 2019-03-23 DIAGNOSIS — Z79899 Other long term (current) drug therapy: Secondary | ICD-10-CM | POA: Diagnosis not present

## 2019-03-23 DIAGNOSIS — D84821 Immunodeficiency due to drugs: Secondary | ICD-10-CM | POA: Diagnosis not present

## 2019-03-23 DIAGNOSIS — Z227 Latent tuberculosis: Secondary | ICD-10-CM | POA: Insufficient documentation

## 2019-03-23 DIAGNOSIS — E785 Hyperlipidemia, unspecified: Secondary | ICD-10-CM | POA: Diagnosis not present

## 2019-03-23 DIAGNOSIS — I10 Essential (primary) hypertension: Secondary | ICD-10-CM | POA: Diagnosis not present

## 2019-03-23 DIAGNOSIS — D849 Immunodeficiency, unspecified: Secondary | ICD-10-CM | POA: Insufficient documentation

## 2019-03-23 DIAGNOSIS — T451X5A Adverse effect of antineoplastic and immunosuppressive drugs, initial encounter: Secondary | ICD-10-CM | POA: Diagnosis not present

## 2019-03-23 DIAGNOSIS — Z888 Allergy status to other drugs, medicaments and biological substances status: Secondary | ICD-10-CM | POA: Diagnosis not present

## 2019-03-25 DIAGNOSIS — Z792 Long term (current) use of antibiotics: Secondary | ICD-10-CM | POA: Diagnosis not present

## 2019-03-25 DIAGNOSIS — T8619 Other complication of kidney transplant: Secondary | ICD-10-CM | POA: Diagnosis not present

## 2019-03-25 DIAGNOSIS — D649 Anemia, unspecified: Secondary | ICD-10-CM | POA: Diagnosis not present

## 2019-03-25 DIAGNOSIS — Z87440 Personal history of urinary (tract) infections: Secondary | ICD-10-CM | POA: Diagnosis not present

## 2019-03-25 DIAGNOSIS — Z79899 Other long term (current) drug therapy: Secondary | ICD-10-CM | POA: Diagnosis not present

## 2019-03-25 DIAGNOSIS — E785 Hyperlipidemia, unspecified: Secondary | ICD-10-CM | POA: Diagnosis not present

## 2019-03-25 DIAGNOSIS — Z227 Latent tuberculosis: Secondary | ICD-10-CM | POA: Diagnosis not present

## 2019-03-25 DIAGNOSIS — Z940 Kidney transplant status: Secondary | ICD-10-CM | POA: Diagnosis not present

## 2019-03-25 DIAGNOSIS — I10 Essential (primary) hypertension: Secondary | ICD-10-CM | POA: Diagnosis not present

## 2019-03-25 DIAGNOSIS — R7989 Other specified abnormal findings of blood chemistry: Secondary | ICD-10-CM | POA: Diagnosis not present

## 2019-03-25 DIAGNOSIS — Z5181 Encounter for therapeutic drug level monitoring: Secondary | ICD-10-CM | POA: Diagnosis not present

## 2019-03-25 DIAGNOSIS — Z4822 Encounter for aftercare following kidney transplant: Secondary | ICD-10-CM | POA: Diagnosis not present

## 2019-03-25 DIAGNOSIS — Z7952 Long term (current) use of systemic steroids: Secondary | ICD-10-CM | POA: Diagnosis not present

## 2019-03-28 DIAGNOSIS — D8989 Other specified disorders involving the immune mechanism, not elsewhere classified: Secondary | ICD-10-CM | POA: Diagnosis not present

## 2019-03-28 DIAGNOSIS — Z7952 Long term (current) use of systemic steroids: Secondary | ICD-10-CM | POA: Diagnosis not present

## 2019-03-28 DIAGNOSIS — R609 Edema, unspecified: Secondary | ICD-10-CM | POA: Diagnosis not present

## 2019-03-28 DIAGNOSIS — I10 Essential (primary) hypertension: Secondary | ICD-10-CM | POA: Diagnosis not present

## 2019-03-28 DIAGNOSIS — Z227 Latent tuberculosis: Secondary | ICD-10-CM | POA: Diagnosis not present

## 2019-03-28 DIAGNOSIS — Z4822 Encounter for aftercare following kidney transplant: Secondary | ICD-10-CM | POA: Diagnosis not present

## 2019-03-28 DIAGNOSIS — D649 Anemia, unspecified: Secondary | ICD-10-CM | POA: Diagnosis not present

## 2019-03-28 DIAGNOSIS — Z79899 Other long term (current) drug therapy: Secondary | ICD-10-CM | POA: Diagnosis not present

## 2019-03-28 DIAGNOSIS — Z87440 Personal history of urinary (tract) infections: Secondary | ICD-10-CM | POA: Diagnosis not present

## 2019-03-28 DIAGNOSIS — Z792 Long term (current) use of antibiotics: Secondary | ICD-10-CM | POA: Diagnosis not present

## 2019-03-28 DIAGNOSIS — E785 Hyperlipidemia, unspecified: Secondary | ICD-10-CM | POA: Diagnosis not present

## 2019-03-28 DIAGNOSIS — Z940 Kidney transplant status: Secondary | ICD-10-CM | POA: Diagnosis not present

## 2019-03-28 DIAGNOSIS — Q612 Polycystic kidney, adult type: Secondary | ICD-10-CM | POA: Diagnosis not present

## 2019-03-28 DIAGNOSIS — R7989 Other specified abnormal findings of blood chemistry: Secondary | ICD-10-CM | POA: Diagnosis not present

## 2019-03-28 DIAGNOSIS — Z5181 Encounter for therapeutic drug level monitoring: Secondary | ICD-10-CM | POA: Diagnosis not present

## 2019-03-31 DIAGNOSIS — Z87891 Personal history of nicotine dependence: Secondary | ICD-10-CM | POA: Diagnosis not present

## 2019-03-31 DIAGNOSIS — Z5181 Encounter for therapeutic drug level monitoring: Secondary | ICD-10-CM | POA: Diagnosis not present

## 2019-03-31 DIAGNOSIS — I10 Essential (primary) hypertension: Secondary | ICD-10-CM | POA: Diagnosis not present

## 2019-03-31 DIAGNOSIS — Z940 Kidney transplant status: Secondary | ICD-10-CM | POA: Diagnosis not present

## 2019-03-31 DIAGNOSIS — Z792 Long term (current) use of antibiotics: Secondary | ICD-10-CM | POA: Diagnosis not present

## 2019-03-31 DIAGNOSIS — Z79621 Long term (current) use of calcineurin inhibitor: Secondary | ICD-10-CM | POA: Insufficient documentation

## 2019-03-31 DIAGNOSIS — D649 Anemia, unspecified: Secondary | ICD-10-CM | POA: Diagnosis not present

## 2019-03-31 DIAGNOSIS — Z227 Latent tuberculosis: Secondary | ICD-10-CM | POA: Diagnosis not present

## 2019-03-31 DIAGNOSIS — R945 Abnormal results of liver function studies: Secondary | ICD-10-CM | POA: Diagnosis not present

## 2019-03-31 DIAGNOSIS — Z79899 Other long term (current) drug therapy: Secondary | ICD-10-CM | POA: Diagnosis not present

## 2019-03-31 DIAGNOSIS — D8989 Other specified disorders involving the immune mechanism, not elsewhere classified: Secondary | ICD-10-CM | POA: Diagnosis not present

## 2019-03-31 DIAGNOSIS — Z7952 Long term (current) use of systemic steroids: Secondary | ICD-10-CM | POA: Diagnosis not present

## 2019-03-31 DIAGNOSIS — R251 Tremor, unspecified: Secondary | ICD-10-CM | POA: Diagnosis not present

## 2019-03-31 DIAGNOSIS — Z4822 Encounter for aftercare following kidney transplant: Secondary | ICD-10-CM | POA: Diagnosis not present

## 2019-04-04 DIAGNOSIS — Z940 Kidney transplant status: Secondary | ICD-10-CM | POA: Diagnosis not present

## 2019-04-04 DIAGNOSIS — R251 Tremor, unspecified: Secondary | ICD-10-CM | POA: Diagnosis not present

## 2019-04-04 DIAGNOSIS — Z4822 Encounter for aftercare following kidney transplant: Secondary | ICD-10-CM | POA: Diagnosis not present

## 2019-04-04 DIAGNOSIS — D849 Immunodeficiency, unspecified: Secondary | ICD-10-CM | POA: Diagnosis not present

## 2019-04-04 DIAGNOSIS — R197 Diarrhea, unspecified: Secondary | ICD-10-CM | POA: Diagnosis not present

## 2019-04-04 DIAGNOSIS — Z87440 Personal history of urinary (tract) infections: Secondary | ICD-10-CM | POA: Diagnosis not present

## 2019-04-04 DIAGNOSIS — Z79899 Other long term (current) drug therapy: Secondary | ICD-10-CM | POA: Diagnosis not present

## 2019-04-04 DIAGNOSIS — I10 Essential (primary) hypertension: Secondary | ICD-10-CM | POA: Diagnosis not present

## 2019-04-04 DIAGNOSIS — D649 Anemia, unspecified: Secondary | ICD-10-CM | POA: Diagnosis not present

## 2019-04-07 DIAGNOSIS — E785 Hyperlipidemia, unspecified: Secondary | ICD-10-CM | POA: Diagnosis not present

## 2019-04-07 DIAGNOSIS — D849 Immunodeficiency, unspecified: Secondary | ICD-10-CM | POA: Diagnosis not present

## 2019-04-07 DIAGNOSIS — Z227 Latent tuberculosis: Secondary | ICD-10-CM | POA: Diagnosis not present

## 2019-04-07 DIAGNOSIS — Z7952 Long term (current) use of systemic steroids: Secondary | ICD-10-CM | POA: Diagnosis not present

## 2019-04-07 DIAGNOSIS — Z4822 Encounter for aftercare following kidney transplant: Secondary | ICD-10-CM | POA: Diagnosis not present

## 2019-04-07 DIAGNOSIS — I10 Essential (primary) hypertension: Secondary | ICD-10-CM | POA: Diagnosis not present

## 2019-04-07 DIAGNOSIS — Z792 Long term (current) use of antibiotics: Secondary | ICD-10-CM | POA: Diagnosis not present

## 2019-04-07 DIAGNOSIS — Z87440 Personal history of urinary (tract) infections: Secondary | ICD-10-CM | POA: Diagnosis not present

## 2019-04-07 DIAGNOSIS — G8918 Other acute postprocedural pain: Secondary | ICD-10-CM | POA: Insufficient documentation

## 2019-04-07 DIAGNOSIS — Z79899 Other long term (current) drug therapy: Secondary | ICD-10-CM | POA: Diagnosis not present

## 2019-04-07 DIAGNOSIS — Z5181 Encounter for therapeutic drug level monitoring: Secondary | ICD-10-CM | POA: Diagnosis not present

## 2019-04-07 DIAGNOSIS — R7989 Other specified abnormal findings of blood chemistry: Secondary | ICD-10-CM | POA: Diagnosis not present

## 2019-04-07 DIAGNOSIS — R251 Tremor, unspecified: Secondary | ICD-10-CM | POA: Diagnosis not present

## 2019-04-07 DIAGNOSIS — D649 Anemia, unspecified: Secondary | ICD-10-CM | POA: Diagnosis not present

## 2019-04-07 DIAGNOSIS — Z940 Kidney transplant status: Secondary | ICD-10-CM | POA: Diagnosis not present

## 2019-04-08 DIAGNOSIS — Z940 Kidney transplant status: Secondary | ICD-10-CM | POA: Diagnosis not present

## 2019-04-11 DIAGNOSIS — Z792 Long term (current) use of antibiotics: Secondary | ICD-10-CM | POA: Diagnosis not present

## 2019-04-11 DIAGNOSIS — I10 Essential (primary) hypertension: Secondary | ICD-10-CM | POA: Diagnosis not present

## 2019-04-11 DIAGNOSIS — Z4822 Encounter for aftercare following kidney transplant: Secondary | ICD-10-CM | POA: Diagnosis not present

## 2019-04-11 DIAGNOSIS — Z227 Latent tuberculosis: Secondary | ICD-10-CM | POA: Diagnosis not present

## 2019-04-11 DIAGNOSIS — D72819 Decreased white blood cell count, unspecified: Secondary | ICD-10-CM | POA: Diagnosis not present

## 2019-04-11 DIAGNOSIS — Z4802 Encounter for removal of sutures: Secondary | ICD-10-CM | POA: Diagnosis not present

## 2019-04-11 DIAGNOSIS — Z466 Encounter for fitting and adjustment of urinary device: Secondary | ICD-10-CM | POA: Diagnosis not present

## 2019-04-11 DIAGNOSIS — Z7952 Long term (current) use of systemic steroids: Secondary | ICD-10-CM | POA: Diagnosis not present

## 2019-04-11 DIAGNOSIS — D849 Immunodeficiency, unspecified: Secondary | ICD-10-CM | POA: Diagnosis not present

## 2019-04-11 DIAGNOSIS — Z940 Kidney transplant status: Secondary | ICD-10-CM | POA: Diagnosis not present

## 2019-04-11 DIAGNOSIS — E785 Hyperlipidemia, unspecified: Secondary | ICD-10-CM | POA: Diagnosis not present

## 2019-04-11 DIAGNOSIS — R251 Tremor, unspecified: Secondary | ICD-10-CM | POA: Diagnosis not present

## 2019-04-11 DIAGNOSIS — Z79899 Other long term (current) drug therapy: Secondary | ICD-10-CM | POA: Diagnosis not present

## 2019-04-18 DIAGNOSIS — Z5181 Encounter for therapeutic drug level monitoring: Secondary | ICD-10-CM | POA: Diagnosis not present

## 2019-04-18 DIAGNOSIS — T451X5A Adverse effect of antineoplastic and immunosuppressive drugs, initial encounter: Secondary | ICD-10-CM | POA: Diagnosis not present

## 2019-04-18 DIAGNOSIS — Z87440 Personal history of urinary (tract) infections: Secondary | ICD-10-CM | POA: Diagnosis not present

## 2019-04-18 DIAGNOSIS — Z79899 Other long term (current) drug therapy: Secondary | ICD-10-CM | POA: Diagnosis not present

## 2019-04-18 DIAGNOSIS — Z940 Kidney transplant status: Secondary | ICD-10-CM | POA: Diagnosis not present

## 2019-04-18 DIAGNOSIS — R251 Tremor, unspecified: Secondary | ICD-10-CM | POA: Diagnosis not present

## 2019-04-18 DIAGNOSIS — X58XXXA Exposure to other specified factors, initial encounter: Secondary | ICD-10-CM | POA: Diagnosis not present

## 2019-04-18 DIAGNOSIS — D84821 Immunodeficiency due to drugs: Secondary | ICD-10-CM | POA: Diagnosis not present

## 2019-04-18 DIAGNOSIS — I10 Essential (primary) hypertension: Secondary | ICD-10-CM | POA: Diagnosis not present

## 2019-04-18 DIAGNOSIS — D849 Immunodeficiency, unspecified: Secondary | ICD-10-CM | POA: Diagnosis not present

## 2019-04-18 DIAGNOSIS — Z888 Allergy status to other drugs, medicaments and biological substances status: Secondary | ICD-10-CM | POA: Diagnosis not present

## 2019-04-18 DIAGNOSIS — Z7952 Long term (current) use of systemic steroids: Secondary | ICD-10-CM | POA: Diagnosis not present

## 2019-04-18 DIAGNOSIS — Z792 Long term (current) use of antibiotics: Secondary | ICD-10-CM | POA: Diagnosis not present

## 2019-04-18 DIAGNOSIS — E785 Hyperlipidemia, unspecified: Secondary | ICD-10-CM | POA: Diagnosis not present

## 2019-04-18 DIAGNOSIS — Z4822 Encounter for aftercare following kidney transplant: Secondary | ICD-10-CM | POA: Diagnosis not present

## 2019-04-25 DIAGNOSIS — D649 Anemia, unspecified: Secondary | ICD-10-CM | POA: Diagnosis not present

## 2019-04-25 DIAGNOSIS — I10 Essential (primary) hypertension: Secondary | ICD-10-CM | POA: Diagnosis not present

## 2019-04-25 DIAGNOSIS — Z792 Long term (current) use of antibiotics: Secondary | ICD-10-CM | POA: Diagnosis not present

## 2019-04-25 DIAGNOSIS — Z87440 Personal history of urinary (tract) infections: Secondary | ICD-10-CM | POA: Diagnosis not present

## 2019-04-25 DIAGNOSIS — Z227 Latent tuberculosis: Secondary | ICD-10-CM | POA: Diagnosis not present

## 2019-04-25 DIAGNOSIS — Z7952 Long term (current) use of systemic steroids: Secondary | ICD-10-CM | POA: Diagnosis not present

## 2019-04-25 DIAGNOSIS — D849 Immunodeficiency, unspecified: Secondary | ICD-10-CM | POA: Diagnosis not present

## 2019-04-25 DIAGNOSIS — Z940 Kidney transplant status: Secondary | ICD-10-CM | POA: Diagnosis not present

## 2019-04-25 DIAGNOSIS — Z5181 Encounter for therapeutic drug level monitoring: Secondary | ICD-10-CM | POA: Diagnosis not present

## 2019-04-25 DIAGNOSIS — E785 Hyperlipidemia, unspecified: Secondary | ICD-10-CM | POA: Diagnosis not present

## 2019-04-25 DIAGNOSIS — D72819 Decreased white blood cell count, unspecified: Secondary | ICD-10-CM | POA: Diagnosis not present

## 2019-04-25 DIAGNOSIS — Z79899 Other long term (current) drug therapy: Secondary | ICD-10-CM | POA: Diagnosis not present

## 2019-04-25 DIAGNOSIS — R251 Tremor, unspecified: Secondary | ICD-10-CM | POA: Diagnosis not present

## 2019-04-25 DIAGNOSIS — Z4822 Encounter for aftercare following kidney transplant: Secondary | ICD-10-CM | POA: Diagnosis not present

## 2019-05-02 DIAGNOSIS — Z79899 Other long term (current) drug therapy: Secondary | ICD-10-CM | POA: Diagnosis not present

## 2019-05-02 DIAGNOSIS — Z87440 Personal history of urinary (tract) infections: Secondary | ICD-10-CM | POA: Diagnosis not present

## 2019-05-02 DIAGNOSIS — Z4822 Encounter for aftercare following kidney transplant: Secondary | ICD-10-CM | POA: Diagnosis not present

## 2019-05-02 DIAGNOSIS — E785 Hyperlipidemia, unspecified: Secondary | ICD-10-CM | POA: Diagnosis not present

## 2019-05-02 DIAGNOSIS — Z792 Long term (current) use of antibiotics: Secondary | ICD-10-CM | POA: Diagnosis not present

## 2019-05-02 DIAGNOSIS — Z227 Latent tuberculosis: Secondary | ICD-10-CM | POA: Diagnosis not present

## 2019-05-02 DIAGNOSIS — Z7952 Long term (current) use of systemic steroids: Secondary | ICD-10-CM | POA: Diagnosis not present

## 2019-05-02 DIAGNOSIS — D649 Anemia, unspecified: Secondary | ICD-10-CM | POA: Diagnosis not present

## 2019-05-02 DIAGNOSIS — I10 Essential (primary) hypertension: Secondary | ICD-10-CM | POA: Diagnosis not present

## 2019-05-02 DIAGNOSIS — D849 Immunodeficiency, unspecified: Secondary | ICD-10-CM | POA: Diagnosis not present

## 2019-05-02 DIAGNOSIS — R251 Tremor, unspecified: Secondary | ICD-10-CM | POA: Diagnosis not present

## 2019-05-02 DIAGNOSIS — Z940 Kidney transplant status: Secondary | ICD-10-CM | POA: Diagnosis not present

## 2019-05-09 DIAGNOSIS — D649 Anemia, unspecified: Secondary | ICD-10-CM | POA: Diagnosis not present

## 2019-05-09 DIAGNOSIS — Z87440 Personal history of urinary (tract) infections: Secondary | ICD-10-CM | POA: Diagnosis not present

## 2019-05-09 DIAGNOSIS — Z7952 Long term (current) use of systemic steroids: Secondary | ICD-10-CM | POA: Diagnosis not present

## 2019-05-09 DIAGNOSIS — Z4822 Encounter for aftercare following kidney transplant: Secondary | ICD-10-CM | POA: Diagnosis not present

## 2019-05-09 DIAGNOSIS — Z5181 Encounter for therapeutic drug level monitoring: Secondary | ICD-10-CM | POA: Diagnosis not present

## 2019-05-09 DIAGNOSIS — Z227 Latent tuberculosis: Secondary | ICD-10-CM | POA: Diagnosis not present

## 2019-05-09 DIAGNOSIS — Z792 Long term (current) use of antibiotics: Secondary | ICD-10-CM | POA: Diagnosis not present

## 2019-05-09 DIAGNOSIS — E785 Hyperlipidemia, unspecified: Secondary | ICD-10-CM | POA: Diagnosis not present

## 2019-05-09 DIAGNOSIS — T451X5A Adverse effect of antineoplastic and immunosuppressive drugs, initial encounter: Secondary | ICD-10-CM | POA: Diagnosis not present

## 2019-05-09 DIAGNOSIS — G251 Drug-induced tremor: Secondary | ICD-10-CM | POA: Diagnosis not present

## 2019-05-09 DIAGNOSIS — I10 Essential (primary) hypertension: Secondary | ICD-10-CM | POA: Diagnosis not present

## 2019-05-09 DIAGNOSIS — Z79899 Other long term (current) drug therapy: Secondary | ICD-10-CM | POA: Diagnosis not present

## 2019-05-09 DIAGNOSIS — D72819 Decreased white blood cell count, unspecified: Secondary | ICD-10-CM | POA: Diagnosis not present

## 2019-05-09 DIAGNOSIS — Z9889 Other specified postprocedural states: Secondary | ICD-10-CM | POA: Diagnosis not present

## 2019-05-09 DIAGNOSIS — Z940 Kidney transplant status: Secondary | ICD-10-CM | POA: Diagnosis not present

## 2019-05-19 ENCOUNTER — Other Ambulatory Visit: Payer: Self-pay | Admitting: Internal Medicine

## 2019-05-19 DIAGNOSIS — Q613 Polycystic kidney, unspecified: Secondary | ICD-10-CM

## 2019-05-23 DIAGNOSIS — G4700 Insomnia, unspecified: Secondary | ICD-10-CM | POA: Diagnosis not present

## 2019-05-23 DIAGNOSIS — Z7982 Long term (current) use of aspirin: Secondary | ICD-10-CM | POA: Diagnosis not present

## 2019-05-23 DIAGNOSIS — Z7952 Long term (current) use of systemic steroids: Secondary | ICD-10-CM | POA: Diagnosis not present

## 2019-05-23 DIAGNOSIS — Z79899 Other long term (current) drug therapy: Secondary | ICD-10-CM | POA: Diagnosis not present

## 2019-05-23 DIAGNOSIS — Z8719 Personal history of other diseases of the digestive system: Secondary | ICD-10-CM | POA: Diagnosis not present

## 2019-05-23 DIAGNOSIS — E785 Hyperlipidemia, unspecified: Secondary | ICD-10-CM | POA: Diagnosis not present

## 2019-05-23 DIAGNOSIS — Z792 Long term (current) use of antibiotics: Secondary | ICD-10-CM | POA: Diagnosis not present

## 2019-05-23 DIAGNOSIS — F329 Major depressive disorder, single episode, unspecified: Secondary | ICD-10-CM | POA: Diagnosis not present

## 2019-05-23 DIAGNOSIS — Z940 Kidney transplant status: Secondary | ICD-10-CM | POA: Diagnosis not present

## 2019-05-23 DIAGNOSIS — Z4822 Encounter for aftercare following kidney transplant: Secondary | ICD-10-CM | POA: Diagnosis not present

## 2019-06-06 DIAGNOSIS — Z79899 Other long term (current) drug therapy: Secondary | ICD-10-CM | POA: Diagnosis not present

## 2019-06-06 DIAGNOSIS — Z940 Kidney transplant status: Secondary | ICD-10-CM | POA: Diagnosis not present

## 2019-06-06 DIAGNOSIS — D72819 Decreased white blood cell count, unspecified: Secondary | ICD-10-CM | POA: Diagnosis not present

## 2019-06-06 DIAGNOSIS — B370 Candidal stomatitis: Secondary | ICD-10-CM | POA: Diagnosis not present

## 2019-06-06 DIAGNOSIS — Z4822 Encounter for aftercare following kidney transplant: Secondary | ICD-10-CM | POA: Diagnosis not present

## 2019-06-06 DIAGNOSIS — E785 Hyperlipidemia, unspecified: Secondary | ICD-10-CM | POA: Diagnosis not present

## 2019-06-06 DIAGNOSIS — Z792 Long term (current) use of antibiotics: Secondary | ICD-10-CM | POA: Diagnosis not present

## 2019-06-06 DIAGNOSIS — R197 Diarrhea, unspecified: Secondary | ICD-10-CM | POA: Insufficient documentation

## 2019-06-06 DIAGNOSIS — Z87440 Personal history of urinary (tract) infections: Secondary | ICD-10-CM | POA: Diagnosis not present

## 2019-06-06 DIAGNOSIS — Z87891 Personal history of nicotine dependence: Secondary | ICD-10-CM | POA: Diagnosis not present

## 2019-06-06 DIAGNOSIS — D649 Anemia, unspecified: Secondary | ICD-10-CM | POA: Diagnosis not present

## 2019-06-06 DIAGNOSIS — Z7952 Long term (current) use of systemic steroids: Secondary | ICD-10-CM | POA: Diagnosis not present

## 2019-06-06 DIAGNOSIS — Z227 Latent tuberculosis: Secondary | ICD-10-CM | POA: Diagnosis not present

## 2019-06-06 DIAGNOSIS — I10 Essential (primary) hypertension: Secondary | ICD-10-CM | POA: Diagnosis not present

## 2019-06-06 DIAGNOSIS — Z5181 Encounter for therapeutic drug level monitoring: Secondary | ICD-10-CM | POA: Diagnosis not present

## 2019-06-22 DIAGNOSIS — Z888 Allergy status to other drugs, medicaments and biological substances status: Secondary | ICD-10-CM | POA: Diagnosis not present

## 2019-06-22 DIAGNOSIS — E785 Hyperlipidemia, unspecified: Secondary | ICD-10-CM | POA: Diagnosis not present

## 2019-06-22 DIAGNOSIS — Z79899 Other long term (current) drug therapy: Secondary | ICD-10-CM | POA: Diagnosis not present

## 2019-06-22 DIAGNOSIS — A159 Respiratory tuberculosis unspecified: Secondary | ICD-10-CM | POA: Diagnosis not present

## 2019-06-22 DIAGNOSIS — Z940 Kidney transplant status: Secondary | ICD-10-CM | POA: Diagnosis not present

## 2019-06-22 DIAGNOSIS — D649 Anemia, unspecified: Secondary | ICD-10-CM | POA: Diagnosis not present

## 2019-06-22 DIAGNOSIS — Z5181 Encounter for therapeutic drug level monitoring: Secondary | ICD-10-CM | POA: Diagnosis not present

## 2019-06-22 DIAGNOSIS — Z87440 Personal history of urinary (tract) infections: Secondary | ICD-10-CM | POA: Diagnosis not present

## 2019-06-22 DIAGNOSIS — Z792 Long term (current) use of antibiotics: Secondary | ICD-10-CM | POA: Diagnosis not present

## 2019-06-22 DIAGNOSIS — Q613 Polycystic kidney, unspecified: Secondary | ICD-10-CM | POA: Diagnosis not present

## 2019-06-22 DIAGNOSIS — Z7952 Long term (current) use of systemic steroids: Secondary | ICD-10-CM | POA: Diagnosis not present

## 2019-06-22 DIAGNOSIS — I10 Essential (primary) hypertension: Secondary | ICD-10-CM | POA: Diagnosis not present

## 2019-06-22 DIAGNOSIS — R197 Diarrhea, unspecified: Secondary | ICD-10-CM | POA: Diagnosis not present

## 2019-06-25 ENCOUNTER — Encounter (HOSPITAL_COMMUNITY): Payer: Self-pay

## 2019-06-25 ENCOUNTER — Other Ambulatory Visit: Payer: Self-pay

## 2019-06-25 ENCOUNTER — Emergency Department (HOSPITAL_COMMUNITY)
Admission: EM | Admit: 2019-06-25 | Discharge: 2019-06-25 | Disposition: A | Discharge status: Home / Self Care | Payer: BC Managed Care – PPO | Attending: Emergency Medicine | Admitting: Emergency Medicine

## 2019-06-25 DIAGNOSIS — H5712 Ocular pain, left eye: Secondary | ICD-10-CM | POA: Diagnosis not present

## 2019-06-25 DIAGNOSIS — X58XXXA Exposure to other specified factors, initial encounter: Secondary | ICD-10-CM | POA: Diagnosis not present

## 2019-06-25 DIAGNOSIS — Z5321 Procedure and treatment not carried out due to patient leaving prior to being seen by health care provider: Secondary | ICD-10-CM | POA: Insufficient documentation

## 2019-06-25 DIAGNOSIS — S0502XA Injury of conjunctiva and corneal abrasion without foreign body, left eye, initial encounter: Secondary | ICD-10-CM | POA: Diagnosis not present

## 2019-06-25 DIAGNOSIS — Y999 Unspecified external cause status: Secondary | ICD-10-CM | POA: Diagnosis not present

## 2019-06-25 MED ORDER — FLUORESCEIN SODIUM 1 MG OP STRP: 1.0000 | ORAL_STRIP | Freq: Once | OPHTHALMIC | Status: DC

## 2019-06-25 MED ORDER — TETRACAINE HCL 0.5 % OP SOLN: 2.0000 [drp] | Freq: Once | OPHTHALMIC | Status: DC
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
## 2019-06-25 NOTE — ED Notes (Signed)
Was told pt left. Called pt multiple times no response

## 2019-07-03 ENCOUNTER — Other Ambulatory Visit: Payer: Self-pay | Admitting: Internal Medicine

## 2019-07-03 DIAGNOSIS — Q613 Polycystic kidney, unspecified: Secondary | ICD-10-CM

## 2019-07-07 DIAGNOSIS — Z940 Kidney transplant status: Secondary | ICD-10-CM | POA: Diagnosis not present

## 2019-07-07 DIAGNOSIS — Z4822 Encounter for aftercare following kidney transplant: Secondary | ICD-10-CM | POA: Diagnosis not present

## 2019-07-20 DIAGNOSIS — D849 Immunodeficiency, unspecified: Secondary | ICD-10-CM | POA: Diagnosis not present

## 2019-07-20 DIAGNOSIS — Z87440 Personal history of urinary (tract) infections: Secondary | ICD-10-CM | POA: Diagnosis not present

## 2019-07-20 DIAGNOSIS — Z940 Kidney transplant status: Secondary | ICD-10-CM | POA: Diagnosis not present

## 2019-07-20 DIAGNOSIS — Z7952 Long term (current) use of systemic steroids: Secondary | ICD-10-CM | POA: Diagnosis not present

## 2019-07-20 DIAGNOSIS — I120 Hypertensive chronic kidney disease with stage 5 chronic kidney disease or end stage renal disease: Secondary | ICD-10-CM | POA: Diagnosis not present

## 2019-07-20 DIAGNOSIS — E785 Hyperlipidemia, unspecified: Secondary | ICD-10-CM | POA: Diagnosis not present

## 2019-07-20 DIAGNOSIS — R600 Localized edema: Secondary | ICD-10-CM | POA: Diagnosis not present

## 2019-07-20 DIAGNOSIS — Z992 Dependence on renal dialysis: Secondary | ICD-10-CM | POA: Diagnosis not present

## 2019-07-20 DIAGNOSIS — N186 End stage renal disease: Secondary | ICD-10-CM | POA: Diagnosis not present

## 2019-07-20 DIAGNOSIS — D84821 Immunodeficiency due to drugs: Secondary | ICD-10-CM | POA: Diagnosis not present

## 2019-07-20 DIAGNOSIS — I10 Essential (primary) hypertension: Secondary | ICD-10-CM | POA: Diagnosis not present

## 2019-07-20 DIAGNOSIS — X58XXXA Exposure to other specified factors, initial encounter: Secondary | ICD-10-CM | POA: Diagnosis not present

## 2019-07-20 DIAGNOSIS — T451X5A Adverse effect of antineoplastic and immunosuppressive drugs, initial encounter: Secondary | ICD-10-CM | POA: Diagnosis not present

## 2019-07-20 DIAGNOSIS — Z79899 Other long term (current) drug therapy: Secondary | ICD-10-CM | POA: Diagnosis not present

## 2019-08-03 DIAGNOSIS — Z940 Kidney transplant status: Secondary | ICD-10-CM | POA: Diagnosis not present

## 2019-08-03 DIAGNOSIS — Z09 Encounter for follow-up examination after completed treatment for conditions other than malignant neoplasm: Secondary | ICD-10-CM | POA: Diagnosis not present

## 2019-08-17 DIAGNOSIS — R635 Abnormal weight gain: Secondary | ICD-10-CM | POA: Diagnosis not present

## 2019-08-17 DIAGNOSIS — Z227 Latent tuberculosis: Secondary | ICD-10-CM | POA: Diagnosis not present

## 2019-08-17 DIAGNOSIS — Z87891 Personal history of nicotine dependence: Secondary | ICD-10-CM | POA: Diagnosis not present

## 2019-08-17 DIAGNOSIS — Z6830 Body mass index (BMI) 30.0-30.9, adult: Secondary | ICD-10-CM | POA: Diagnosis not present

## 2019-08-17 DIAGNOSIS — Z7952 Long term (current) use of systemic steroids: Secondary | ICD-10-CM | POA: Diagnosis not present

## 2019-08-17 DIAGNOSIS — Z4822 Encounter for aftercare following kidney transplant: Secondary | ICD-10-CM | POA: Diagnosis not present

## 2019-08-17 DIAGNOSIS — Z87448 Personal history of other diseases of urinary system: Secondary | ICD-10-CM | POA: Diagnosis not present

## 2019-08-17 DIAGNOSIS — Z8619 Personal history of other infectious and parasitic diseases: Secondary | ICD-10-CM | POA: Diagnosis not present

## 2019-08-17 DIAGNOSIS — Z9889 Other specified postprocedural states: Secondary | ICD-10-CM | POA: Diagnosis not present

## 2019-08-17 DIAGNOSIS — I10 Essential (primary) hypertension: Secondary | ICD-10-CM | POA: Diagnosis not present

## 2019-08-17 DIAGNOSIS — Z79899 Other long term (current) drug therapy: Secondary | ICD-10-CM | POA: Diagnosis not present

## 2019-08-17 DIAGNOSIS — Z960 Presence of urogenital implants: Secondary | ICD-10-CM | POA: Diagnosis not present

## 2019-08-17 DIAGNOSIS — Z792 Long term (current) use of antibiotics: Secondary | ICD-10-CM | POA: Diagnosis not present

## 2019-08-23 DIAGNOSIS — Z940 Kidney transplant status: Secondary | ICD-10-CM | POA: Diagnosis not present

## 2019-08-23 DIAGNOSIS — Z4822 Encounter for aftercare following kidney transplant: Secondary | ICD-10-CM | POA: Diagnosis not present

## 2019-08-24 ENCOUNTER — Encounter: Payer: BC Managed Care – PPO | Admitting: Internal Medicine

## 2019-09-05 ENCOUNTER — Ambulatory Visit (INDEPENDENT_AMBULATORY_CARE_PROVIDER_SITE_OTHER): Payer: BC Managed Care – PPO | Admitting: Internal Medicine

## 2019-09-05 ENCOUNTER — Other Ambulatory Visit: Payer: Self-pay

## 2019-09-05 ENCOUNTER — Encounter: Payer: Self-pay | Admitting: Internal Medicine

## 2019-09-05 VITALS — BP 126/61 | HR 98 | Temp 98.9°F | Ht 66.0 in | Wt 185.9 lb

## 2019-09-05 DIAGNOSIS — M25562 Pain in left knee: Secondary | ICD-10-CM

## 2019-09-05 DIAGNOSIS — I10 Essential (primary) hypertension: Secondary | ICD-10-CM

## 2019-09-05 MED ORDER — OXYCODONE HCL 5 MG PO TABS: 2.5000 mg | ORAL_TABLET | Freq: Three times a day (TID) | ORAL | 0 refills | Status: DC | PRN

## 2019-09-05 NOTE — Assessment & Plan Note (Signed)
BP: 126/61   Kristen Moreno states she was started on Amlodipine by the kidney transplant center. She states they follow her BP and cholesterol.   Assessment/Plan:   Well controlled at this time.   - Continue with Amlodipine 5 mg

## 2019-09-05 NOTE — Patient Instructions (Addendum)
It was nice seeing you today! Thank you for choosing [facility] for your Primary Care.    Today we talked about:   1. Knee Pain: I am concerned about a meniscus or ligament tear. I ordered an MRI to evaluate for this.  a. I sent in Oxycodone for pain control. Please take half a tablet every 8 hours as needed.  b. In addition, you can continue to take Tylenol 650 mg every 6 hours as needed for pain.  c. Continue icing daily
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
## 2019-09-06 NOTE — Progress Notes (Signed)
Internal Medicine Clinic Attending  Case discussed with Dr. Basaraba  At the time of the visit.  We reviewed the resident's history and exam and pertinent patient test results.  I agree with the assessment, diagnosis, and plan of care documented in the resident's note.  

## 2019-09-09 DIAGNOSIS — Z87440 Personal history of urinary (tract) infections: Secondary | ICD-10-CM | POA: Diagnosis not present

## 2019-09-09 DIAGNOSIS — Z8619 Personal history of other infectious and parasitic diseases: Secondary | ICD-10-CM | POA: Diagnosis not present

## 2019-09-09 DIAGNOSIS — B258 Other cytomegaloviral diseases: Secondary | ICD-10-CM | POA: Diagnosis not present

## 2019-09-09 DIAGNOSIS — Z4822 Encounter for aftercare following kidney transplant: Secondary | ICD-10-CM | POA: Diagnosis not present

## 2019-09-09 DIAGNOSIS — B259 Cytomegaloviral disease, unspecified: Secondary | ICD-10-CM | POA: Insufficient documentation

## 2019-09-09 DIAGNOSIS — Z9181 History of falling: Secondary | ICD-10-CM | POA: Diagnosis not present

## 2019-09-09 DIAGNOSIS — I10 Essential (primary) hypertension: Secondary | ICD-10-CM | POA: Diagnosis not present

## 2019-09-09 DIAGNOSIS — Z940 Kidney transplant status: Secondary | ICD-10-CM | POA: Diagnosis not present

## 2019-09-09 DIAGNOSIS — Z792 Long term (current) use of antibiotics: Secondary | ICD-10-CM | POA: Diagnosis not present

## 2019-09-09 DIAGNOSIS — Z8719 Personal history of other diseases of the digestive system: Secondary | ICD-10-CM | POA: Diagnosis not present

## 2019-09-09 DIAGNOSIS — Z7952 Long term (current) use of systemic steroids: Secondary | ICD-10-CM | POA: Diagnosis not present

## 2019-09-09 DIAGNOSIS — Z79899 Other long term (current) drug therapy: Secondary | ICD-10-CM | POA: Diagnosis not present

## 2019-09-09 DIAGNOSIS — R0600 Dyspnea, unspecified: Secondary | ICD-10-CM | POA: Diagnosis not present

## 2019-09-09 DIAGNOSIS — E785 Hyperlipidemia, unspecified: Secondary | ICD-10-CM | POA: Diagnosis not present

## 2019-09-09 DIAGNOSIS — Z227 Latent tuberculosis: Secondary | ICD-10-CM | POA: Diagnosis not present

## 2019-09-09 DIAGNOSIS — R110 Nausea: Secondary | ICD-10-CM | POA: Diagnosis not present

## 2019-09-14 ENCOUNTER — Other Ambulatory Visit: Payer: Self-pay | Admitting: Internal Medicine

## 2019-09-14 DIAGNOSIS — Q613 Polycystic kidney, unspecified: Secondary | ICD-10-CM

## 2019-09-19 ENCOUNTER — Other Ambulatory Visit: Payer: Self-pay

## 2019-09-19 ENCOUNTER — Ambulatory Visit (HOSPITAL_COMMUNITY)
Admission: RE | Admit: 2019-09-19 | Discharge: 2019-09-19 | Disposition: A | Discharge status: Home / Self Care | Payer: BC Managed Care – PPO | Source: Ambulatory Visit | Attending: Internal Medicine | Admitting: Internal Medicine

## 2019-09-19 DIAGNOSIS — M25562 Pain in left knee: Secondary | ICD-10-CM | POA: Insufficient documentation

## 2019-09-19 DIAGNOSIS — M7122 Synovial cyst of popliteal space [Baker], left knee: Secondary | ICD-10-CM | POA: Diagnosis not present

## 2019-09-19 DIAGNOSIS — R600 Localized edema: Secondary | ICD-10-CM | POA: Diagnosis not present

## 2019-09-19 DIAGNOSIS — M25462 Effusion, left knee: Secondary | ICD-10-CM | POA: Diagnosis not present

## 2019-09-19 DIAGNOSIS — S83232A Complex tear of medial meniscus, current injury, left knee, initial encounter: Secondary | ICD-10-CM | POA: Diagnosis not present

## 2019-09-20 ENCOUNTER — Other Ambulatory Visit: Payer: Self-pay | Admitting: Internal Medicine

## 2019-09-20 DIAGNOSIS — S83512A Sprain of anterior cruciate ligament of left knee, initial encounter: Secondary | ICD-10-CM

## 2019-09-20 DIAGNOSIS — S83232A Complex tear of medial meniscus, current injury, left knee, initial encounter: Secondary | ICD-10-CM

## 2019-09-20 NOTE — Progress Notes (Signed)
Results reviewed. Contacted patient and instructed to start limiting weight bearing activity, including work if she cannot use crutches while working. Placed urgent referral to orthopedic surgery. Appointment made for the next day, 09/21/19 at  [facility].

## 2019-09-21 DIAGNOSIS — S83512A Sprain of anterior cruciate ligament of left knee, initial encounter: Secondary | ICD-10-CM

## 2019-09-21 DIAGNOSIS — S83511A Sprain of anterior cruciate ligament of right knee, initial encounter: Secondary | ICD-10-CM | POA: Diagnosis not present

## 2019-09-21 HISTORY — DX: Sprain of anterior cruciate ligament of left knee, initial encounter: S83.512A

## 2019-09-22 ENCOUNTER — Other Ambulatory Visit: Payer: Self-pay | Admitting: Student

## 2019-09-22 DIAGNOSIS — M6281 Muscle weakness (generalized): Secondary | ICD-10-CM | POA: Diagnosis not present

## 2019-09-22 DIAGNOSIS — S83512D Sprain of anterior cruciate ligament of left knee, subsequent encounter: Secondary | ICD-10-CM | POA: Diagnosis not present

## 2019-09-22 DIAGNOSIS — M25562 Pain in left knee: Secondary | ICD-10-CM

## 2019-09-22 DIAGNOSIS — S83232D Complex tear of medial meniscus, current injury, left knee, subsequent encounter: Secondary | ICD-10-CM | POA: Diagnosis not present

## 2019-09-27 DIAGNOSIS — D849 Immunodeficiency, unspecified: Secondary | ICD-10-CM | POA: Diagnosis not present

## 2019-09-27 DIAGNOSIS — Z940 Kidney transplant status: Secondary | ICD-10-CM | POA: Diagnosis not present

## 2019-09-28 ENCOUNTER — Telehealth: Payer: Self-pay

## 2019-09-28 NOTE — Telephone Encounter (Signed)
Called pt-no answer, unable to LVM for patient to call back. Want to notified patient FMLA has been completed and faxed.

## 2019-09-28 NOTE — Addendum Note (Signed)
Addended byGaylan Gerold on: 09/28/2019 12:10 PM   Modules accepted: Orders

## 2019-09-29 DIAGNOSIS — S83232D Complex tear of medial meniscus, current injury, left knee, subsequent encounter: Secondary | ICD-10-CM | POA: Diagnosis not present

## 2019-09-29 DIAGNOSIS — S83512D Sprain of anterior cruciate ligament of left knee, subsequent encounter: Secondary | ICD-10-CM | POA: Diagnosis not present

## 2019-09-29 DIAGNOSIS — M6281 Muscle weakness (generalized): Secondary | ICD-10-CM | POA: Diagnosis not present

## 2019-09-29 DIAGNOSIS — M25562 Pain in left knee: Secondary | ICD-10-CM | POA: Diagnosis not present

## 2019-10-03 DIAGNOSIS — M25562 Pain in left knee: Secondary | ICD-10-CM | POA: Diagnosis not present

## 2019-10-07 DIAGNOSIS — Z4822 Encounter for aftercare following kidney transplant: Secondary | ICD-10-CM | POA: Diagnosis not present

## 2019-10-07 DIAGNOSIS — Q612 Polycystic kidney, adult type: Secondary | ICD-10-CM | POA: Diagnosis not present

## 2019-10-07 DIAGNOSIS — B349 Viral infection, unspecified: Secondary | ICD-10-CM | POA: Diagnosis not present

## 2019-10-07 DIAGNOSIS — Z792 Long term (current) use of antibiotics: Secondary | ICD-10-CM | POA: Diagnosis not present

## 2019-10-07 DIAGNOSIS — D8989 Other specified disorders involving the immune mechanism, not elsewhere classified: Secondary | ICD-10-CM | POA: Diagnosis not present

## 2019-10-07 DIAGNOSIS — Z940 Kidney transplant status: Secondary | ICD-10-CM | POA: Diagnosis not present

## 2019-10-07 DIAGNOSIS — Z23 Encounter for immunization: Secondary | ICD-10-CM | POA: Diagnosis not present

## 2019-10-07 DIAGNOSIS — Z87440 Personal history of urinary (tract) infections: Secondary | ICD-10-CM | POA: Diagnosis not present

## 2019-10-07 DIAGNOSIS — A159 Respiratory tuberculosis unspecified: Secondary | ICD-10-CM | POA: Diagnosis not present

## 2019-10-07 DIAGNOSIS — Z7952 Long term (current) use of systemic steroids: Secondary | ICD-10-CM | POA: Diagnosis not present

## 2019-10-07 DIAGNOSIS — E785 Hyperlipidemia, unspecified: Secondary | ICD-10-CM | POA: Diagnosis not present

## 2019-10-07 DIAGNOSIS — Z79899 Other long term (current) drug therapy: Secondary | ICD-10-CM | POA: Diagnosis not present

## 2019-10-07 DIAGNOSIS — I10 Essential (primary) hypertension: Secondary | ICD-10-CM | POA: Diagnosis not present

## 2019-10-12 ENCOUNTER — Encounter (HOSPITAL_BASED_OUTPATIENT_CLINIC_OR_DEPARTMENT_OTHER): Payer: Self-pay | Admitting: Orthopedic Surgery

## 2019-10-12 ENCOUNTER — Other Ambulatory Visit: Payer: Self-pay

## 2019-10-12 NOTE — Progress Notes (Addendum)
Addendum: spoke with Kristen zanetto pa pt meets [facility] guidelines   Spoke w/ via phone for pre-op interview---pt Lab needs dos---- I stat 8              COVID test ------10-15-2019 at [place] at -------930 am 10-18-2019 NPO after MN NO Solid Food.  Clear liquids from MN until---830 am then npo Medications to take morning of surgery -----prozac, isoniazad, myfortic, prograf, amlodipine, prednisone, omeprazole Diabetic medication -----n/a Patient Special Instructions -----none Pre-Op special Istructions -----none Patient verbalized understanding of instructions that were given at this phone interview. Patient denies shortness of breath, chest pain, fever, cough at this phone interview.  Anesthesia : right kidney transplant 03-15-2019 [facility], [facility] osa no cpap used, latent tb   Chart to Kristen zanetto pa for review  [id number] medicine [facility] 08-17-2019 care everywhere /chart Cardiologist :none Chest x-ray :none EKG :03-16-2019 [facility] on chart Echo :none Stress test:none Cardiac Cath : none Activity level: climbs stairs without problems Sleep Study/ CPAP :osa no cpap used, sleep study 08-10-2014 epic, cpap titration 09-11-2014 epic Fasting Blood Sugar :      / Checks Blood Sugar -- times a day:  n/a Blood Thinner/ Instructions /Last Dose:n/a ASA / Instructions/ Last Dose : n/a
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
## 2019-10-15 ENCOUNTER — Other Ambulatory Visit (HOSPITAL_COMMUNITY)
Admission: RE | Admit: 2019-10-15 | Discharge: 2019-10-15 | Disposition: A | Discharge status: Home / Self Care | Payer: BC Managed Care – PPO | Source: Ambulatory Visit | Attending: Orthopedic Surgery | Admitting: Orthopedic Surgery

## 2019-10-15 DIAGNOSIS — Z01812 Encounter for preprocedural laboratory examination: Secondary | ICD-10-CM | POA: Insufficient documentation

## 2019-10-15 DIAGNOSIS — Z20822 Contact with and (suspected) exposure to covid-19: Secondary | ICD-10-CM | POA: Diagnosis not present

## 2019-10-15 LAB — SARS CORONAVIRUS 2 (TAT 6-24 HRS): SARS Coronavirus 2: NEGATIVE

## 2019-10-18 ENCOUNTER — Ambulatory Visit (HOSPITAL_BASED_OUTPATIENT_CLINIC_OR_DEPARTMENT_OTHER)
Admission: RE | Admit: 2019-10-18 | Discharge: 2019-10-18 | Disposition: A | Discharge status: Home / Self Care | Payer: BC Managed Care – PPO | Attending: Orthopedic Surgery | Admitting: Orthopedic Surgery

## 2019-10-18 ENCOUNTER — Other Ambulatory Visit: Payer: Self-pay

## 2019-10-18 ENCOUNTER — Encounter (HOSPITAL_BASED_OUTPATIENT_CLINIC_OR_DEPARTMENT_OTHER): Payer: Self-pay | Admitting: Orthopedic Surgery

## 2019-10-18 ENCOUNTER — Encounter (HOSPITAL_BASED_OUTPATIENT_CLINIC_OR_DEPARTMENT_OTHER)
Admission: RE | Disposition: A | Discharge status: Home / Self Care | Payer: Self-pay | Source: Home / Self Care | Attending: Orthopedic Surgery

## 2019-10-18 ENCOUNTER — Ambulatory Visit (HOSPITAL_BASED_OUTPATIENT_CLINIC_OR_DEPARTMENT_OTHER): Payer: BC Managed Care – PPO | Admitting: Physician Assistant

## 2019-10-18 DIAGNOSIS — G4733 Obstructive sleep apnea (adult) (pediatric): Secondary | ICD-10-CM | POA: Insufficient documentation

## 2019-10-18 DIAGNOSIS — K219 Gastro-esophageal reflux disease without esophagitis: Secondary | ICD-10-CM | POA: Diagnosis not present

## 2019-10-18 DIAGNOSIS — Z940 Kidney transplant status: Secondary | ICD-10-CM | POA: Diagnosis not present

## 2019-10-18 DIAGNOSIS — X58XXXA Exposure to other specified factors, initial encounter: Secondary | ICD-10-CM | POA: Insufficient documentation

## 2019-10-18 DIAGNOSIS — Z992 Dependence on renal dialysis: Secondary | ICD-10-CM | POA: Diagnosis not present

## 2019-10-18 DIAGNOSIS — M2242 Chondromalacia patellae, left knee: Secondary | ICD-10-CM | POA: Diagnosis not present

## 2019-10-18 DIAGNOSIS — S83242A Other tear of medial meniscus, current injury, left knee, initial encounter: Secondary | ICD-10-CM | POA: Diagnosis not present

## 2019-10-18 DIAGNOSIS — Z87891 Personal history of nicotine dependence: Secondary | ICD-10-CM | POA: Insufficient documentation

## 2019-10-18 DIAGNOSIS — Z79899 Other long term (current) drug therapy: Secondary | ICD-10-CM | POA: Insufficient documentation

## 2019-10-18 DIAGNOSIS — Q612 Polycystic kidney, adult type: Secondary | ICD-10-CM | POA: Insufficient documentation

## 2019-10-18 DIAGNOSIS — Z7952 Long term (current) use of systemic steroids: Secondary | ICD-10-CM | POA: Diagnosis not present

## 2019-10-18 DIAGNOSIS — S83512A Sprain of anterior cruciate ligament of left knee, initial encounter: Secondary | ICD-10-CM | POA: Insufficient documentation

## 2019-10-18 DIAGNOSIS — G8918 Other acute postprocedural pain: Secondary | ICD-10-CM | POA: Diagnosis not present

## 2019-10-18 DIAGNOSIS — I120 Hypertensive chronic kidney disease with stage 5 chronic kidney disease or end stage renal disease: Secondary | ICD-10-CM | POA: Diagnosis not present

## 2019-10-18 DIAGNOSIS — N186 End stage renal disease: Secondary | ICD-10-CM | POA: Insufficient documentation

## 2019-10-18 HISTORY — DX: Hypomagnesemia: E83.42

## 2019-10-18 HISTORY — PX: KNEE ARTHROSCOPY WITH ANTERIOR CRUCIATE LIGAMENT (ACL) REPAIR: SHX5644

## 2019-10-18 HISTORY — DX: Immunodeficiency, unspecified: D84.9

## 2019-10-18 HISTORY — DX: Respiratory tuberculosis unspecified: A15.9

## 2019-10-18 LAB — CBC
HCT: 41.2 % (ref 36.0–46.0)
Hemoglobin: 13.3 g/dL (ref 12.0–15.0)
MCH: 32 pg (ref 26.0–34.0)
MCHC: 32.3 g/dL (ref 30.0–36.0)
MCV: 99 fL (ref 80.0–100.0)
Platelets: 211 10*3/uL (ref 150–400)
RBC: 4.16 MIL/uL (ref 3.87–5.11)
RDW: 15.2 % (ref 11.5–15.5)
WBC: 6.6 10*3/uL (ref 4.0–10.5)
nRBC: 0 % (ref 0.0–0.2)

## 2019-10-18 LAB — BASIC METABOLIC PANEL
Anion gap: 11 (ref 5–15)
BUN: 19 mg/dL (ref 6–20)
CO2: 20 mmol/L — ABNORMAL LOW (ref 22–32)
Calcium: 9.3 mg/dL (ref 8.9–10.3)
Chloride: 109 mmol/L (ref 98–111)
Creatinine, Ser: 1.3 mg/dL — ABNORMAL HIGH (ref 0.44–1.00)
GFR calc Af Amer: 53 mL/min — ABNORMAL LOW (ref >60)
GFR calc non Af Amer: 46 mL/min — ABNORMAL LOW (ref >60)
Glucose, Bld: 136 mg/dL — ABNORMAL HIGH (ref 70–99)
Potassium: 4 mmol/L (ref 3.5–5.1)
Sodium: 140 mmol/L (ref 135–145)

## 2019-10-18 SURGERY — KNEE ARTHROSCOPY WITH ANTERIOR CRUCIATE LIGAMENT (ACL) REPAIR
Anesthesia: General | Site: Knee | Laterality: Left

## 2019-10-18 MED ORDER — SODIUM CHLORIDE 0.9 % IR SOLN
Status: DC | PRN
  Administered 2019-10-18: 9000 mL

## 2019-10-18 MED ORDER — PROPOFOL 10 MG/ML IV BOLUS
INTRAVENOUS | Status: DC | PRN
  Administered 2019-10-18: 160 mg via INTRAVENOUS

## 2019-10-18 MED ORDER — PROMETHAZINE HCL 25 MG/ML IJ SOLN: 6.2500 mg | INTRAMUSCULAR | Status: DC | PRN

## 2019-10-18 MED ORDER — ROPIVACAINE HCL 7.5 MG/ML IJ SOLN
INTRAMUSCULAR | Status: DC | PRN
  Administered 2019-10-18: 20 mL via PERINEURAL

## 2019-10-18 MED ORDER — FENTANYL CITRATE (PF) 100 MCG/2ML IJ SOLN
100.0000 ug | Freq: Once | INTRAMUSCULAR | Status: AC
  Administered 2019-10-18: 100 ug via INTRAVENOUS

## 2019-10-18 MED ORDER — SODIUM CHLORIDE 0.9 % IV SOLN: INTRAVENOUS | Status: DC

## 2019-10-18 MED ORDER — OXYCODONE HCL 5 MG PO TABS: 5.0000 mg | ORAL_TABLET | Freq: Four times a day (QID) | ORAL | 0 refills | Status: AC | PRN

## 2019-10-18 MED ORDER — MEPERIDINE HCL 25 MG/ML IJ SOLN: 6.2500 mg | INTRAMUSCULAR | Status: DC | PRN

## 2019-10-18 MED ORDER — HYDROMORPHONE HCL 1 MG/ML IJ SOLN
0.2500 mg | INTRAMUSCULAR | Status: DC | PRN
  Administered 2019-10-18 (×2): 0.25 mg via INTRAVENOUS

## 2019-10-18 MED ORDER — DEXAMETHASONE SODIUM PHOSPHATE 10 MG/ML IJ SOLN
INTRAMUSCULAR | Status: DC | PRN
  Administered 2019-10-18: 10 mg via INTRAVENOUS

## 2019-10-18 MED ORDER — PROPOFOL 10 MG/ML IV BOLUS
INTRAVENOUS | Status: AC
  Filled 2019-10-18: qty 20

## 2019-10-18 MED ORDER — METHOCARBAMOL 1000 MG/10ML IJ SOLN
500.0000 mg | Freq: Once | INTRAVENOUS | Status: AC
  Administered 2019-10-18: 500 mg via INTRAVENOUS
  Filled 2019-10-18: qty 5
  Filled 2019-10-18: qty 500
  Filled 2019-10-18: qty 5

## 2019-10-18 MED ORDER — MIDAZOLAM HCL 2 MG/2ML IJ SOLN
2.0000 mg | Freq: Once | INTRAMUSCULAR | Status: AC
  Administered 2019-10-18: 2 mg via INTRAVENOUS

## 2019-10-18 MED ORDER — MIDAZOLAM HCL 2 MG/2ML IJ SOLN
INTRAMUSCULAR | Status: AC
  Filled 2019-10-18: qty 2

## 2019-10-18 MED ORDER — OXYCODONE HCL 5 MG/5ML PO SOLN: 5.0000 mg | Freq: Once | ORAL | Status: DC | PRN

## 2019-10-18 MED ORDER — FENTANYL CITRATE (PF) 100 MCG/2ML IJ SOLN
INTRAMUSCULAR | Status: DC | PRN
Start: 2019-10-18 — End: 2019-10-18
  Administered 2019-10-18 (×3): 25 ug via INTRAVENOUS
  Administered 2019-10-18: 50 ug via INTRAVENOUS
  Administered 2019-10-18: 100 ug via INTRAVENOUS
  Administered 2019-10-18: 25 ug via INTRAVENOUS

## 2019-10-18 MED ORDER — OXYCODONE HCL 5 MG PO TABS: 5.0000 mg | ORAL_TABLET | Freq: Once | ORAL | Status: DC | PRN

## 2019-10-18 MED ORDER — MIDAZOLAM HCL 5 MG/5ML IJ SOLN
INTRAMUSCULAR | Status: DC | PRN
  Administered 2019-10-18: 2 mg via INTRAVENOUS

## 2019-10-18 MED ORDER — DEXAMETHASONE SODIUM PHOSPHATE 10 MG/ML IJ SOLN
INTRAMUSCULAR | Status: AC
  Filled 2019-10-18: qty 1

## 2019-10-18 MED ORDER — ASPIRIN EC 81 MG PO TBEC: 81.0000 mg | DELAYED_RELEASE_TABLET | Freq: Every day | ORAL | 2 refills | Status: AC

## 2019-10-18 MED ORDER — CEFAZOLIN SODIUM-DEXTROSE 2-4 GM/100ML-% IV SOLN
INTRAVENOUS | Status: AC
  Filled 2019-10-18: qty 100

## 2019-10-18 MED ORDER — CEFAZOLIN SODIUM-DEXTROSE 2-4 GM/100ML-% IV SOLN
2.0000 g | INTRAVENOUS | Status: AC
  Administered 2019-10-18: 2 g via INTRAVENOUS

## 2019-10-18 MED ORDER — PHENYLEPHRINE 40 MCG/ML (10ML) SYRINGE FOR IV PUSH (FOR BLOOD PRESSURE SUPPORT)
PREFILLED_SYRINGE | INTRAVENOUS | Status: AC
  Filled 2019-10-18: qty 10

## 2019-10-18 MED ORDER — HYDROMORPHONE HCL 1 MG/ML IJ SOLN
INTRAMUSCULAR | Status: AC
Start: 2019-10-18 — End: ?
  Filled 2019-10-18: qty 1

## 2019-10-18 MED ORDER — ONDANSETRON HCL 4 MG/2ML IJ SOLN
INTRAMUSCULAR | Status: DC | PRN
  Administered 2019-10-18: 4 mg via INTRAVENOUS

## 2019-10-18 MED ORDER — MIDAZOLAM HCL 2 MG/2ML IJ SOLN: 0.5000 mg | Freq: Once | INTRAMUSCULAR | Status: DC | PRN

## 2019-10-18 MED ORDER — ONDANSETRON HCL 4 MG/2ML IJ SOLN
INTRAMUSCULAR | Status: AC
  Filled 2019-10-18: qty 2

## 2019-10-18 MED ORDER — ACETAMINOPHEN 500 MG PO TABS: 1000.0000 mg | ORAL_TABLET | Freq: Four times a day (QID) | ORAL | 0 refills | Status: AC | PRN

## 2019-10-18 MED ORDER — LIDOCAINE 2% (20 MG/ML) 5 ML SYRINGE
INTRAMUSCULAR | Status: AC
  Filled 2019-10-18: qty 5

## 2019-10-18 MED ORDER — FENTANYL CITRATE (PF) 100 MCG/2ML IJ SOLN
INTRAMUSCULAR | Status: AC
  Filled 2019-10-18: qty 2

## 2019-10-18 MED ORDER — FENTANYL CITRATE (PF) 100 MCG/2ML IJ SOLN
INTRAMUSCULAR | Status: AC
Start: 2019-10-18 — End: ?
  Filled 2019-10-18: qty 2

## 2019-10-18 MED ORDER — PHENYLEPHRINE 40 MCG/ML (10ML) SYRINGE FOR IV PUSH (FOR BLOOD PRESSURE SUPPORT)
PREFILLED_SYRINGE | INTRAVENOUS | Status: DC | PRN
  Administered 2019-10-18: 40 ug via INTRAVENOUS

## 2019-10-18 SURGICAL SUPPLY — 89 items
ALLOGRAFT GRFTLNK IMPLANT SYST (Anchor) IMPLANT
ANCHOR BUTTON TIGHTROPE 14 (Button) ×1 IMPLANT
ANCHOR BUTTON TIGHTROPE W/FC3 (Orthopedic Implant) ×1 IMPLANT
APL PRP STRL LF DISP 70% ISPRP (MISCELLANEOUS) ×1
BANDAGE ESMARK 6X9 LF (GAUZE/BANDAGES/DRESSINGS) ×1 IMPLANT
BLADE CUTTER GATOR 3.5 (BLADE) ×2 IMPLANT
BLADE SURG 15 STRL LF DISP TIS (BLADE) ×2 IMPLANT
BLADE SURG 15 STRL SS (BLADE) ×4
BNDG CMPR 9X6 STRL LF SNTH (GAUZE/BANDAGES/DRESSINGS) ×1
BNDG ELASTIC 4X5.8 VLCR STR LF (GAUZE/BANDAGES/DRESSINGS) IMPLANT
BNDG ELASTIC 6X5.8 VLCR STR LF (GAUZE/BANDAGES/DRESSINGS) IMPLANT
BNDG ESMARK 6X9 LF (GAUZE/BANDAGES/DRESSINGS) ×2
BURR OVAL 8 FLU 4.0X13 (MISCELLANEOUS) ×1 IMPLANT
CHLORAPREP W/TINT 26 (MISCELLANEOUS) ×2 IMPLANT
COVER BACK TABLE 60X90IN (DRAPES) ×2 IMPLANT
COVER WAND RF STERILE (DRAPES) ×2 IMPLANT
CUFF TOURN SGL QUICK 34 (TOURNIQUET CUFF)
CUFF TRNQT CYL 34X4.125X (TOURNIQUET CUFF) IMPLANT
CUTTER FLIP II 9.5MM (INSTRUMENTS) IMPLANT
DECANTER SPIKE VIAL GLASS SM (MISCELLANEOUS) IMPLANT
DISSECTOR  3.8MM X 13CM (MISCELLANEOUS) ×2
DISSECTOR 3.8MM X 13CM (MISCELLANEOUS) IMPLANT
DRAPE ARTHROSCOPY W/POUCH 114 (DRAPES) ×2 IMPLANT
DRAPE IMP U-DRAPE 54X76 (DRAPES) ×2 IMPLANT
DRAPE U-SHAPE 47X51 STRL (DRAPES) ×2 IMPLANT
DRILL FLIPCUTTER II 10.5MM (CUTTER) IMPLANT
DRILL FLIPCUTTER II 10MM (CUTTER) IMPLANT
DRILL FLIPCUTTER II 7.0MM (INSTRUMENTS) IMPLANT
DRILL FLIPCUTTER II 7.5MM (MISCELLANEOUS) IMPLANT
DRILL FLIPCUTTER II 8.0MM (INSTRUMENTS) IMPLANT
DRILL FLIPCUTTER II 8.5MM (INSTRUMENTS) IMPLANT
DRILL FLIPCUTTER II 9.0MM (INSTRUMENTS) IMPLANT
DRSG EMULSION OIL 3X3 NADH (GAUZE/BANDAGES/DRESSINGS) IMPLANT
ELECT REM PT RETURN 9FT ADLT (ELECTROSURGICAL) ×2
ELECTRODE REM PT RTRN 9FT ADLT (ELECTROSURGICAL) ×1 IMPLANT
EXCALIBUR 3.8MM X 13CM (MISCELLANEOUS) ×1 IMPLANT
FLIP CUTTER II 7.0MM (INSTRUMENTS)
FLIPCUTTER II 10.5MM (CUTTER)
FLIPCUTTER II 10MM (CUTTER)
FLIPCUTTER II 7.5MM (MISCELLANEOUS)
FLIPCUTTER II 8.0MM (INSTRUMENTS)
FLIPCUTTER II 8.5MM (INSTRUMENTS)
FLIPCUTTER II 9.0MM (INSTRUMENTS)
FlipCutter Drill ×1 IMPLANT
GAUZE SPONGE 4X4 12PLY STRL (GAUZE/BANDAGES/DRESSINGS) ×4 IMPLANT
GLOVE BIO SURGEON STRL SZ7.5 (GLOVE) ×4 IMPLANT
GLOVE BIOGEL PI IND STRL 8 (GLOVE) ×2 IMPLANT
GLOVE BIOGEL PI INDICATOR 8 (GLOVE) ×2
GOWN STRL REUS W/ TWL LRG LVL3 (GOWN DISPOSABLE) ×2 IMPLANT
GOWN STRL REUS W/ TWL XL LVL3 (GOWN DISPOSABLE) ×1 IMPLANT
GOWN STRL REUS W/TWL LRG LVL3 (GOWN DISPOSABLE) ×4
GOWN STRL REUS W/TWL XL LVL3 (GOWN DISPOSABLE) ×2
GRAFT TISS 60-80 FRZN TENDON (Tissue) IMPLANT
GUIDEPIN REAMER CUTTER 11MM (INSTRUMENTS) IMPLANT
IMMOBILIZER KNEE 22 UNIV (SOFTGOODS) ×2 IMPLANT
IMMOBILIZER KNEE 24 THIGH 36 (MISCELLANEOUS) ×1 IMPLANT
IMMOBILIZER KNEE 24 UNIV (MISCELLANEOUS) ×2
IV NS IRRIG 3000ML ARTHROMATIC (IV SOLUTION) ×8 IMPLANT
KIT TRANSTIBIAL (DISPOSABLE) IMPLANT
MANIFOLD NEPTUNE II (INSTRUMENTS) ×2 IMPLANT
NS IRRIG 1000ML POUR BTL (IV SOLUTION) ×2 IMPLANT
PACK ARTHROSCOPY DSU (CUSTOM PROCEDURE TRAY) ×2 IMPLANT
PACK BASIN DAY SURGERY FS (CUSTOM PROCEDURE TRAY) ×2 IMPLANT
PAD CAST 4YDX4 CTTN HI CHSV (CAST SUPPLIES) ×1 IMPLANT
PADDING CAST COTTON 4X4 STRL (CAST SUPPLIES) ×2
PADDING CAST COTTON 6X4 STRL (CAST SUPPLIES) ×2 IMPLANT
PASSER SUT SWANSON 36MM LOOP (INSTRUMENTS) IMPLANT
PENCIL SMOKE EVACUATOR (MISCELLANEOUS) ×1 IMPLANT
PK GRAFTLINK ALLO IMPLANT SYST (Anchor) ×2 IMPLANT
PORT APPOLLO RF 90DEGREE MULTI (SURGICAL WAND) ×1 IMPLANT
PROBE BIPOLAR ATHRO 135MM 90D (MISCELLANEOUS) ×2 IMPLANT
SPONGE LAP 4X18 RFD (DISPOSABLE) IMPLANT
STRIP CLOSURE SKIN 1/2X4 (GAUZE/BANDAGES/DRESSINGS) ×2 IMPLANT
SUCTION FRAZIER HANDLE 10FR (MISCELLANEOUS)
SUCTION TUBE FRAZIER 10FR DISP (MISCELLANEOUS) IMPLANT
SUT ETHILON 2 0 FS 18 (SUTURE) ×3 IMPLANT
SUT FIBERWIRE #2 38 T-5 BLUE (SUTURE)
SUT MNCRL AB 4-0 PS2 18 (SUTURE) ×2 IMPLANT
SUT MON AB 2-0 CT1 36 (SUTURE) IMPLANT
SUT VIC AB 0 SH 27 (SUTURE) ×2 IMPLANT
SUT VIC AB 2-0 SH 27 (SUTURE) ×2
SUT VIC AB 2-0 SH 27XBRD (SUTURE) ×1 IMPLANT
SUTURE FIBERWR #2 38 T-5 BLUE (SUTURE) IMPLANT
TAPE CLOTH 3X10 TAN LF (GAUZE/BANDAGES/DRESSINGS) IMPLANT
TISSUE GRAFTLINK FGL (Tissue) ×2 IMPLANT
TOWEL OR 17X26 10 PK STRL BLUE (TOWEL DISPOSABLE) ×2 IMPLANT
TUBE CONNECTING 12X1/4 (SUCTIONS) ×1 IMPLANT
TUBING ARTHROSCOPY IRRIG 16FT (MISCELLANEOUS) ×1 IMPLANT
WATER STERILE IRR 1000ML POUR (IV SOLUTION) ×2 IMPLANT

## 2019-10-18 NOTE — Transfer of Care (Signed)
Immediate Anesthesia Transfer of Care Note  Patient: Kristen Moreno  Procedure(s) Performed: KNEE ARTHROSCOPY WITH ANTERIOR CRUCIATE LIGAMENT (ACL) REPAIR (Left Knee)  Patient Location: PACU  Anesthesia Type:General  Level of Consciousness: awake, alert  and oriented  Airway & Oxygen Therapy: Patient Spontanous Breathing and Patient connected to nasal cannula oxygen  Post-op Assessment: Report given to RN and Post -op Vital signs reviewed and stable  Post vital signs: Reviewed and stable  Last Vitals:  Vitals Value Taken Time  BP 144/88 10/18/19 1405  Temp    Pulse 94 10/18/19 1408  Resp 19 10/18/19 1408  SpO2 96 % 10/18/19 1408  Vitals shown include unvalidated device data.  Last Pain:  Vitals:   10/18/19 0914  TempSrc: Oral         Complications: No complications documented.

## 2019-10-18 NOTE — Anesthesia Procedure Notes (Signed)
Anesthesia Regional Block: Adductor canal block   Pre-Anesthetic Checklist: ,, timeout performed, Correct Patient, Correct Site, Correct Laterality, Correct Procedure, Correct Position, site marked, Risks and benefits discussed,  Surgical consent,  Pre-op evaluation,  At surgeon's request and post-op pain management  Laterality: Left and Lower  Prep: chloraprep       Needles:  Injection technique: Single-shot  Needle Type: Echogenic Needle     Needle Length: 9cm  Needle Gauge: 21     Additional Needles:   Procedures:,,,, ultrasound used (permanent image in chart),,,,  Narrative:  Start time: 10/18/2019 10:52 AM End time: 10/18/2019 10:59 AM Injection made incrementally with aspirations every 5 mL.  Performed by: Personally  Anesthesiologist: Annye Asa, MD  Additional Notes: Pt identified in Holding room.  Monitors applied. Working IV access confirmed. Sterile prep L thigh.  #21ga ECHOgenic Arrow block needle into adductor canal with US guidance.  20cc 0.75% Ropivacaine injected incrementally after negative test dose.  Patient asymptomatic, VSS, no heme aspirated, tolerated well.  Jenita Seashore, MD

## 2019-10-18 NOTE — Anesthesia Procedure Notes (Signed)
Procedure Name: LMA Insertion Date/Time: 10/18/2019 12:24 PM Performed by: Tuwanna Krausz D, CRNA Pre-anesthesia Checklist: Patient identified, Emergency Drugs available, Suction available and Patient being monitored Patient Re-evaluated:Patient Re-evaluated prior to induction Oxygen Delivery Method: Circle system utilized Preoxygenation: Pre-oxygenation with 100% oxygen Induction Type: IV induction Ventilation: Mask ventilation without difficulty LMA: LMA inserted LMA Size: 4.0 Tube type: Oral Number of attempts: 1 Placement Confirmation: positive ETCO2 and breath sounds checked- equal and bilateral Tube secured with: Tape Dental Injury: Teeth and Oropharynx as per pre-operative assessment

## 2019-10-18 NOTE — Discharge Instructions (Signed)
Elevate leg and apply ice to reduce pain and swelling.   If needed, you may increase pain medication for the first few days post op to 2 tablets every 4 hours.  Diet: As you were doing prior to surgery.   Shower:  May shower but keep the wounds dry, use an occlusive plastic wrap, NO SOAKING IN TUB.  If the bandage gets wet, change with a clean dry gauze.   Dressing:  Keep dressings on and dry until follow up.  Activity:  Increase activity slowly as tolerated, but follow the weight bearing instructions below.  The rules on driving is that you can not be taking narcotics while you drive, and you must feel in control of the vehicle.    Weight Bearing:   As tolerated only when in knee immobilizer (long leg wrap with many Velcro straps).   To prevent constipation: you may use a stool softener such as -  Colace (over the counter) 100 mg by mouth twice a day  Drink plenty of fluids (prune juice may be helpful) and high fiber foods Miralax (over the counter) for constipation as needed.    Itching:  If you experience itching with your medications, try taking only a single pain pill, or even half a pain pill at a time.  You can also use benadryl over the counter for itching or also to help with sleep.   Precautions:  If you experience chest pain or shortness of breath - call 911 immediately for transfer to the hospital emergency department!!  If you develop a fever greater that 101 F, purulent drainage from wound, increased redness or drainage from wound, or calf pain -- Call the office at [phone]                                                 Follow- Up Appointment:  Please call for an appointment to be seen in 1-2 weeks [facility] - [phone]  Regional Anesthesia Blocks  1. Numbness or the inability to move the "blocked" extremity may last from 3-48 hours after placement. The length of time depends on the medication injected and your individual response to the medication. If the  numbness is not going away after 48 hours, call your surgeon.  2. The extremity that is blocked will need to be protected until the numbness is gone and the  Strength has returned. Because you cannot feel it, you will need to take extra care to avoid injury. Because it may be weak, you may have difficulty moving it or using it. You may not know what position it is in without looking at it while the block is in effect.  3. For blocks in the legs and feet, returning to weight bearing and walking needs to be done carefully. You will need to wait until the numbness is entirely gone and the strength has returned. You should be able to move your leg and foot normally before you try and bear weight or walk. You will need someone to be with you when you first try to ensure you do not fall and possibly risk injury.  4. Bruising and tenderness at the needle site are common side effects and will resolve in a few days.  5. Persistent numbness or new problems with movement should be communicated to the surgeon or the [facility] (  [phone])/ [place] [phone]).  Post Anesthesia Home Care Instructions  Activity: Get plenty of rest for the remainder of the day. A responsible adult should stay with you for 24 hours following the procedure.  For the next 24 hours, DO NOT: -Drive a car -Paediatric nurse -Drink alcoholic beverages -Take any medication unless instructed by your physician -Make any legal decisions or sign important papers.  Meals: Start with liquid foods such as gelatin or soup. Progress to regular foods as tolerated. Avoid greasy, spicy, heavy foods. If nausea and/or vomiting occur, drink only clear liquids until the nausea and/or vomiting subsides. Call your physician if vomiting continues.  Special Instructions/Symptoms: Your throat may feel dry or sore from the anesthesia or the breathing tube placed in your throat during surgery. If this causes  discomfort, gargle with warm salt water. The discomfort should disappear within 24 hours.  If you had a scopolamine patch placed behind your ear for the management of post- operative nausea and/or vomiting:  1. The medication in the patch is effective for 72 hours, after which it should be removed.  Wrap patch in a tissue and discard in the trash. Wash hands thoroughly with soap and water. 2. You may remove the patch earlier than 72 hours if you experience unpleasant side effects which may include dry mouth, dizziness or visual disturbances. 3. Avoid touching the patch. Wash your hands with soap and water after contact with the patch.

## 2019-10-18 NOTE — Progress Notes (Signed)
Positive thrill left upper arm graft.

## 2019-10-18 NOTE — Anesthesia Preprocedure Evaluation (Signed)
Anesthesia Evaluation  Patient identified by MRN, date of birth, ID band Patient awake    Reviewed: Allergy & Precautions, NPO status , Patient's Chart, lab work & pertinent test results  History of Anesthesia Complications Negative for: history of anesthetic complications  Airway Mallampati: II  TM Distance: >3 FB Neck ROM: Full    Dental  (+) Missing, Partial Lower, Loose, Dental Advisory Given   Pulmonary sleep apnea (does not tolerated CPAP) , former smoker,  10/15/2019 SARS coronavirus NEG   breath sounds clear to auscultation       Cardiovascular hypertension, Pt. on medications (-) angina Rhythm:Regular Rate:Normal  '20 Stress ECHO: Normal left ventricular function and global wall motion with stress, EF >70% with stress. Negative exercise echocardiography for inducible ischemia at target heart rate.    Neuro/Psych negative neurological ROS     GI/Hepatic Neg liver ROS, GERD  Medicated and Controlled,  Endo/Other  negative endocrine ROS  Renal/GU Renal disease (now s/p renal transplant: functioning kidney: creat 1.3)Polycystic kidney disease     Musculoskeletal  (+) Arthritis ,   Abdominal (+) + obese,   Peds  Hematology negative hematology ROS (+)   Anesthesia Other Findings   Reproductive/Obstetrics                             Anesthesia Physical Anesthesia Plan  ASA: III  Anesthesia Plan: General   Post-op Pain Management: GA combined w/ Regional for post-op pain   Induction: Intravenous  PONV Risk Score and Plan: 3 and Ondansetron, Dexamethasone and Treatment may vary due to age or medical condition  Airway Management Planned: LMA  Additional Equipment: None  Intra-op Plan:   Post-operative Plan:   Informed Consent: I have reviewed the patients History and Physical, chart, labs and discussed the procedure including the risks, benefits and alternatives for the  proposed anesthesia with the patient or authorized representative who has indicated his/her understanding and acceptance.     Dental advisory given  Plan Discussed with: CRNA and Surgeon  Anesthesia Plan Comments: (Plan routine monitors, GA with adductor canal block for post op analgesia)        Anesthesia Quick Evaluation

## 2019-10-18 NOTE — Anesthesia Postprocedure Evaluation (Signed)
Anesthesia Post Note  Patient: Kristen Moreno  Procedure(s) Performed: KNEE ARTHROSCOPY WITH ANTERIOR CRUCIATE LIGAMENT (ACL) REPAIR (Left Knee)     Patient location during evaluation: PACU Anesthesia Type: General and Regional Level of consciousness: patient cooperative, sedated and oriented Pain management: pain level controlled (pain improving) Vital Signs Assessment: post-procedure vital signs reviewed and stable Respiratory status: spontaneous breathing, nonlabored ventilation and respiratory function stable Cardiovascular status: blood pressure returned to baseline and stable Postop Assessment: no apparent nausea or vomiting Anesthetic complications: no   No complications documented.  Last Vitals:  Vitals:   10/18/19 1410 10/18/19 1415  BP: (!) 144/88 (!) 148/89  Pulse:  92  Resp:  15  Temp: 36.7 C   SpO2: 93% 94%    Last Pain:  Vitals:   10/18/19 1430  TempSrc:   PainSc: Asleep                 Tanyiah Laurich,E. Tawania Daponte

## 2019-10-18 NOTE — Interval H&P Note (Signed)
History and Physical Interval Note:  10/18/2019 10:33 AM  Kristen Moreno  has presented today for surgery, with the diagnosis of LEFT ANTERIOR CRUCIATE LIGAMENT TEAR, LEFT MEDIAL MENISCUS TEAR.  The various methods of treatment have been discussed with the patient and family. After consideration of risks, benefits and other options for treatment, the patient has consented to  Procedure(s): KNEE ARTHROSCOPY WITH ANTERIOR CRUCIATE LIGAMENT (ACL) REPAIR (Left) as a surgical intervention.  The patient's history has been reviewed, patient examined, no change in status, stable for surgery.  I have reviewed the patient's chart and labs.  Questions were answered to the patient's satisfaction.     Renette Butters

## 2019-10-18 NOTE — Progress Notes (Signed)
Assisted Dr. Carswell Jackson with left, ultrasound guided, adductor canal block. Side rails up, monitors on throughout procedure. See vital signs in flow sheet. Tolerated Procedure well.  

## 2019-10-19 ENCOUNTER — Encounter (HOSPITAL_BASED_OUTPATIENT_CLINIC_OR_DEPARTMENT_OTHER): Payer: Self-pay | Admitting: Orthopedic Surgery

## 2019-10-19 NOTE — Op Note (Signed)
10/18/2019  3:53 PM  PATIENT:  Kristen Moreno    PRE-OPERATIVE DIAGNOSIS:  LEFT ANTERIOR CRUCIATE LIGAMENT TEAR, LEFT MEDIAL MENISCUS TEAR  POST-OPERATIVE DIAGNOSIS:  Same  PROCEDURE:  KNEE ARTHROSCOPY WITH ANTERIOR CRUCIATE LIGAMENT (ACL) REPAIR  SURGEON:  Renette Butters, MD  ASSISTANT: Margy Clarks, PA-C, he was present and scrubbed throughout the case, critical for completion in a timely fashion, and for retraction, instrumentation, and closure.     ANESTHESIA:   General  PREOPERATIVE INDICATIONS:  Kristen Moreno is a  59 y.o. female with a diagnosis of LEFT ANTERIOR CRUCIATE LIGAMENT TEAR, LEFT MEDIAL MENISCUS TEAR who failed conservative measures and elected for surgical management.    The risks benefits and alternatives were discussed with the patient preoperatively including but not limited to the risks of infection, bleeding, nerve injury, stiffness, cardiopulmonary complications, the need for revision surgery, recurrent instability, progression of arthritis, the potential for use of a allograft and related disease transmission risks, among others and the patient was willing to proceed.  .  OPERATIVE IMPLANTS: Arthrex anterior cruciate ligament Graft link dual tight rope  OPERATIVE FINDINGS: The anterior cruciate ligament was completely torn. The PCL was intact. The posterior lateral corner was intact to dial testing. Medial meniscal tear   OPERATIVE PROCEDURE: The patient was brought to the operating room and placed in the supine position. General anesthesia was administered. IV antibiotics were given. The lower extremity was prepped and draped in usual sterile fashion. Exam under anesthesia demonstrated the above-named findings. Time out was performed.  The leg was elevated and exsanguinated and the tourniquet was inflated. Incision was made over the proximal tibia.    The graft leg allograft was thawed opened and prepared for transfer to the leg.  Knee  arthroscopy was then performed, and the above named findings were noted.    The anterior cruciate ligament however was torn.  I performed a partial medial meniscectomy with a shaver.   Chondroplasty was perfromed with a shaver at the patella.   I then removed the previous anterior cruciate ligament stump, and performed a mild notchplasty.  The outside in guide was then applied to the appropriate position and the retro-cutter was used to drill the femoral socket. Care was taken to maintain the cortical bridge.  I then drilled the tibial tunnel using the retro-cutter, maintaining the outer cortex. All the soft tissue remnants were removed and cleaned at the aperture of the tunnel.  The passing suture was delivered through the medial portal and the through the femoral tunnel. The Endobutton was directly visualized it as it entered the femoral tunnel and flipped.   I then tensioned the anterior cruciate ligament tightrope, and deliver the graft up into the femoral tunnel. I then passed the passing stitch through the medial portal and out the tibial tunnel. I then placed the Endobutton disc within the suture and walking down to the tibia I confirm that it sat flush on the bone. I then used this to tension the graft into the knee and down into the tunnel. I directly visualize the tension of the graft. I then cycled the knee 15 times and tension the graft again. I then cycled again placed a posterior drawer at 30 and tension one last time.  Excellent fixation was achieved on both the femoral and tibial side, and the wounds were irrigated copiously and the sartorius fascia repaired with Vicryl, and the portals repaired with Monocryl with Steri-Strips and sterile gauze.  The patient was  awakened and returned to PACU in stable and satisfactory condition. There were no complications and She tolerated the procedure well.  Post Operative plan: The patient will be weightbearing as tolerated in a knee  immobilizer full time. If under 18 DVT prophylaxis will consist of early ambulation. If over 18 he will consist of early ambulation and aspirin 81 mg once a day.

## 2019-10-25 DIAGNOSIS — Z79899 Other long term (current) drug therapy: Secondary | ICD-10-CM | POA: Diagnosis not present

## 2019-10-25 DIAGNOSIS — Z01818 Encounter for other preprocedural examination: Secondary | ICD-10-CM | POA: Diagnosis not present

## 2019-10-25 DIAGNOSIS — Z940 Kidney transplant status: Secondary | ICD-10-CM | POA: Diagnosis not present

## 2019-10-28 DIAGNOSIS — S83242D Other tear of medial meniscus, current injury, left knee, subsequent encounter: Secondary | ICD-10-CM | POA: Diagnosis not present

## 2019-11-03 DIAGNOSIS — Z4822 Encounter for aftercare following kidney transplant: Secondary | ICD-10-CM | POA: Diagnosis not present

## 2019-11-03 DIAGNOSIS — Z792 Long term (current) use of antibiotics: Secondary | ICD-10-CM | POA: Diagnosis not present

## 2019-11-03 DIAGNOSIS — S8992XD Unspecified injury of left lower leg, subsequent encounter: Secondary | ICD-10-CM | POA: Diagnosis not present

## 2019-11-03 DIAGNOSIS — Z79899 Other long term (current) drug therapy: Secondary | ICD-10-CM | POA: Diagnosis not present

## 2019-11-03 DIAGNOSIS — Z227 Latent tuberculosis: Secondary | ICD-10-CM | POA: Diagnosis not present

## 2019-11-03 DIAGNOSIS — Z87440 Personal history of urinary (tract) infections: Secondary | ICD-10-CM | POA: Diagnosis not present

## 2019-11-03 DIAGNOSIS — Z87891 Personal history of nicotine dependence: Secondary | ICD-10-CM | POA: Diagnosis not present

## 2019-11-03 DIAGNOSIS — E785 Hyperlipidemia, unspecified: Secondary | ICD-10-CM | POA: Diagnosis not present

## 2019-11-03 DIAGNOSIS — Z5181 Encounter for therapeutic drug level monitoring: Secondary | ICD-10-CM | POA: Diagnosis not present

## 2019-11-03 DIAGNOSIS — D849 Immunodeficiency, unspecified: Secondary | ICD-10-CM | POA: Diagnosis not present

## 2019-11-03 DIAGNOSIS — B258 Other cytomegaloviral diseases: Secondary | ICD-10-CM | POA: Diagnosis not present

## 2019-11-03 DIAGNOSIS — I10 Essential (primary) hypertension: Secondary | ICD-10-CM | POA: Diagnosis not present

## 2019-11-03 DIAGNOSIS — Z23 Encounter for immunization: Secondary | ICD-10-CM | POA: Diagnosis not present

## 2019-11-03 DIAGNOSIS — Z9889 Other specified postprocedural states: Secondary | ICD-10-CM | POA: Diagnosis not present

## 2019-11-03 DIAGNOSIS — Z7952 Long term (current) use of systemic steroids: Secondary | ICD-10-CM | POA: Diagnosis not present

## 2019-11-03 DIAGNOSIS — Z940 Kidney transplant status: Secondary | ICD-10-CM | POA: Diagnosis not present

## 2019-11-04 DIAGNOSIS — M6281 Muscle weakness (generalized): Secondary | ICD-10-CM | POA: Diagnosis not present

## 2019-11-04 DIAGNOSIS — R262 Difficulty in walking, not elsewhere classified: Secondary | ICD-10-CM | POA: Diagnosis not present

## 2019-11-04 DIAGNOSIS — M25662 Stiffness of left knee, not elsewhere classified: Secondary | ICD-10-CM | POA: Diagnosis not present

## 2019-11-07 ENCOUNTER — Ambulatory Visit (INDEPENDENT_AMBULATORY_CARE_PROVIDER_SITE_OTHER): Payer: BC Managed Care – PPO | Admitting: Internal Medicine

## 2019-11-07 ENCOUNTER — Encounter: Payer: Self-pay | Admitting: Internal Medicine

## 2019-11-07 ENCOUNTER — Other Ambulatory Visit: Payer: Self-pay

## 2019-11-07 VITALS — BP 156/68 | HR 100 | Temp 98.4°F | Ht 66.0 in | Wt 195.3 lb

## 2019-11-07 DIAGNOSIS — K2270 Barrett's esophagus without dysplasia: Secondary | ICD-10-CM | POA: Diagnosis not present

## 2019-11-07 DIAGNOSIS — M6281 Muscle weakness (generalized): Secondary | ICD-10-CM | POA: Diagnosis not present

## 2019-11-07 DIAGNOSIS — N186 End stage renal disease: Secondary | ICD-10-CM

## 2019-11-07 DIAGNOSIS — N63 Unspecified lump in unspecified breast: Secondary | ICD-10-CM

## 2019-11-07 DIAGNOSIS — I120 Hypertensive chronic kidney disease with stage 5 chronic kidney disease or end stage renal disease: Secondary | ICD-10-CM | POA: Diagnosis not present

## 2019-11-07 DIAGNOSIS — R262 Difficulty in walking, not elsewhere classified: Secondary | ICD-10-CM | POA: Diagnosis not present

## 2019-11-07 DIAGNOSIS — M25562 Pain in left knee: Secondary | ICD-10-CM | POA: Diagnosis not present

## 2019-11-07 DIAGNOSIS — Z992 Dependence on renal dialysis: Secondary | ICD-10-CM

## 2019-11-07 DIAGNOSIS — M25662 Stiffness of left knee, not elsewhere classified: Secondary | ICD-10-CM | POA: Diagnosis not present

## 2019-11-07 HISTORY — DX: Unspecified lump in unspecified breast: N63.0

## 2019-11-07 MED ORDER — OMEPRAZOLE 20 MG PO CPDR: 20.0000 mg | DELAYED_RELEASE_CAPSULE | Freq: Every day | ORAL | 3 refills | Status: DC

## 2019-11-07 NOTE — Patient Instructions (Signed)
Thank you for allowing [place] to provide your care today. Today we discussed your breast nodule    I have ordered no labs for you. I will call if any are abnormal.    Today we made the following changes to your medications.    Please take omeprazole 20mg  daily  Please follow-up in 3 months.    Should you have any questions or concerns please call the internal medicine clinic at [phone].     Food Choices for Gastroesophageal Reflux Disease, Adult When you have gastroesophageal reflux disease (GERD), the foods you eat and your eating habits are very important. Choosing the right foods can help ease your discomfort. Think about working with a nutrition specialist (dietitian) to help you make good choices. What are tips for following this plan?  Meals  Choose healthy foods that are low in fat, such as fruits, vegetables, whole grains, low-fat dairy products, and lean meat, fish, and poultry.  Eat small meals often instead of 3 large meals a day. Eat your meals slowly, and in a place where you are relaxed. Avoid bending over or lying down until 2-3 hours after eating.  Avoid eating meals 2-3 hours before bed.  Avoid drinking a lot of liquid with meals.  Cook foods using methods other than frying. Bake, grill, or broil food instead.  Avoid or limit: ? Chocolate. ? Peppermint or spearmint. ? Alcohol. ? Pepper. ? Black and decaffeinated coffee. ? Black and decaffeinated tea. ? Bubbly (carbonated) soft drinks. ? Caffeinated energy drinks and soft drinks.  Limit high-fat foods such as: ? Fatty meat or fried foods. ? Whole milk, cream, butter, or ice cream. ? Nuts and nut butters. ? Pastries, donuts, and sweets made with butter or shortening.  Avoid foods that cause symptoms. These foods may be different for everyone. Common foods that cause symptoms include: ? Tomatoes. ? Oranges, lemons, and limes. ? Peppers. ? Spicy food. ? Onions and  garlic. ? Vinegar. Lifestyle  Maintain a healthy weight. Ask your doctor what weight is healthy for you. If you need to lose weight, work with your doctor to do so safely.  Exercise for at least 30 minutes for 5 or more days each week, or as told by your doctor.  Wear loose-fitting clothes.  Do not smoke. If you need help quitting, ask your doctor.  Sleep with the head of your bed higher than your feet. Use a wedge under the mattress or blocks under the bed frame to raise the head of the bed. Summary  When you have gastroesophageal reflux disease (GERD), food and lifestyle choices are very important in easing your symptoms.  Eat small meals often instead of 3 large meals a day. Eat your meals slowly, and in a place where you are relaxed.  Limit high-fat foods such as fatty meat or fried foods.  Avoid bending over or lying down until 2-3 hours after eating.  Avoid peppermint and spearmint, caffeine, alcohol, and chocolate. This information is not intended to replace advice given to you by your health care provider. Make sure you discuss any questions you have with your health care provider. Document Revised: 04/29/2018 Document Reviewed: 02/12/2016 Elsevier Patient Education  [place].

## 2019-11-07 NOTE — Assessment & Plan Note (Signed)
Pt requires refills on medications with associated diagnosis above.  Reviewed disease process and find this medication to be necessary, will not change dose or alter current therapy. 

## 2019-11-07 NOTE — Assessment & Plan Note (Signed)
Kristen Moreno is a 59 yo F w/ PMH of ESRD s/p renal-transplant and hx of hysterectomy presenting to [facility] with complaint of breast nodule. She was in her usual state of health until 2 weeks prior when she noticed a firm nodule underneath her left breast during dinner time. She mentions association with sharp pain of her left shoulder but mentions she has had intermittent episodes of this shoulder pain in the past. Her left nodule appeared to be persistent for the last 2 weeks but this morning while she was getting ready for her appointment, she noticed that the nodule appeared to have disappeared. She mentions getting her mammogram 1 year prior without significant findings. She denies any fevers, chills, weight loss, nausea, vomiting, breast pain, nipple discharge or axillary tenderness.  A/P Present w/ breast nodule which have now spontaneously resolved. Unable to find any nodules or mass on physical exam. She is over-due for her annual mammogram as she is currently taking immunosuppressant for her transplant which puts her at risk for malignancy. Will refer for screening mammogram. - Mammogram ordered - Reassurance provided
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
## 2019-11-09 DIAGNOSIS — M25662 Stiffness of left knee, not elsewhere classified: Secondary | ICD-10-CM | POA: Diagnosis not present

## 2019-11-09 DIAGNOSIS — R262 Difficulty in walking, not elsewhere classified: Secondary | ICD-10-CM | POA: Diagnosis not present

## 2019-11-09 DIAGNOSIS — M6281 Muscle weakness (generalized): Secondary | ICD-10-CM | POA: Diagnosis not present

## 2019-11-09 DIAGNOSIS — M25562 Pain in left knee: Secondary | ICD-10-CM | POA: Diagnosis not present

## 2019-11-09 NOTE — Progress Notes (Signed)
Internal Medicine Clinic Attending  Case discussed with Dr. Lee  At the time of the visit.  We reviewed the resident's history and exam and pertinent patient test results.  I agree with the assessment, diagnosis, and plan of care documented in the resident's note.    

## 2019-11-11 ENCOUNTER — Other Ambulatory Visit: Payer: Self-pay | Admitting: Internal Medicine

## 2019-11-11 DIAGNOSIS — Q613 Polycystic kidney, unspecified: Secondary | ICD-10-CM

## 2019-11-15 DIAGNOSIS — M25662 Stiffness of left knee, not elsewhere classified: Secondary | ICD-10-CM | POA: Diagnosis not present

## 2019-11-15 DIAGNOSIS — M6281 Muscle weakness (generalized): Secondary | ICD-10-CM | POA: Diagnosis not present

## 2019-11-15 DIAGNOSIS — R262 Difficulty in walking, not elsewhere classified: Secondary | ICD-10-CM | POA: Diagnosis not present

## 2019-11-15 DIAGNOSIS — S83512D Sprain of anterior cruciate ligament of left knee, subsequent encounter: Secondary | ICD-10-CM | POA: Diagnosis not present

## 2019-11-18 DIAGNOSIS — R262 Difficulty in walking, not elsewhere classified: Secondary | ICD-10-CM | POA: Diagnosis not present

## 2019-11-18 DIAGNOSIS — M25562 Pain in left knee: Secondary | ICD-10-CM | POA: Diagnosis not present

## 2019-11-18 DIAGNOSIS — M6281 Muscle weakness (generalized): Secondary | ICD-10-CM | POA: Diagnosis not present

## 2019-11-18 DIAGNOSIS — M25662 Stiffness of left knee, not elsewhere classified: Secondary | ICD-10-CM | POA: Diagnosis not present

## 2019-11-22 DIAGNOSIS — M6281 Muscle weakness (generalized): Secondary | ICD-10-CM | POA: Diagnosis not present

## 2019-11-22 DIAGNOSIS — R262 Difficulty in walking, not elsewhere classified: Secondary | ICD-10-CM | POA: Diagnosis not present

## 2019-11-22 DIAGNOSIS — M25662 Stiffness of left knee, not elsewhere classified: Secondary | ICD-10-CM | POA: Diagnosis not present

## 2019-11-22 DIAGNOSIS — M25562 Pain in left knee: Secondary | ICD-10-CM | POA: Diagnosis not present

## 2019-11-25 DIAGNOSIS — M25662 Stiffness of left knee, not elsewhere classified: Secondary | ICD-10-CM | POA: Diagnosis not present

## 2019-11-25 DIAGNOSIS — M6281 Muscle weakness (generalized): Secondary | ICD-10-CM | POA: Diagnosis not present

## 2019-11-25 DIAGNOSIS — M25562 Pain in left knee: Secondary | ICD-10-CM | POA: Diagnosis not present

## 2019-11-25 DIAGNOSIS — R262 Difficulty in walking, not elsewhere classified: Secondary | ICD-10-CM | POA: Diagnosis not present

## 2019-11-28 DIAGNOSIS — M6281 Muscle weakness (generalized): Secondary | ICD-10-CM | POA: Diagnosis not present

## 2019-11-28 DIAGNOSIS — M25562 Pain in left knee: Secondary | ICD-10-CM | POA: Diagnosis not present

## 2019-11-28 DIAGNOSIS — R262 Difficulty in walking, not elsewhere classified: Secondary | ICD-10-CM | POA: Diagnosis not present

## 2019-11-28 DIAGNOSIS — M25662 Stiffness of left knee, not elsewhere classified: Secondary | ICD-10-CM | POA: Diagnosis not present

## 2019-11-29 DIAGNOSIS — Z79899 Other long term (current) drug therapy: Secondary | ICD-10-CM | POA: Diagnosis not present

## 2019-11-29 DIAGNOSIS — I120 Hypertensive chronic kidney disease with stage 5 chronic kidney disease or end stage renal disease: Secondary | ICD-10-CM | POA: Diagnosis not present

## 2019-11-29 DIAGNOSIS — Z992 Dependence on renal dialysis: Secondary | ICD-10-CM | POA: Diagnosis not present

## 2019-11-29 DIAGNOSIS — R739 Hyperglycemia, unspecified: Secondary | ICD-10-CM | POA: Diagnosis not present

## 2019-11-29 DIAGNOSIS — Z940 Kidney transplant status: Secondary | ICD-10-CM | POA: Diagnosis not present

## 2019-11-29 DIAGNOSIS — E785 Hyperlipidemia, unspecified: Secondary | ICD-10-CM | POA: Diagnosis not present

## 2019-11-29 DIAGNOSIS — N186 End stage renal disease: Secondary | ICD-10-CM | POA: Diagnosis not present

## 2019-11-29 DIAGNOSIS — E119 Type 2 diabetes mellitus without complications: Secondary | ICD-10-CM | POA: Insufficient documentation

## 2019-11-29 DIAGNOSIS — X58XXXD Exposure to other specified factors, subsequent encounter: Secondary | ICD-10-CM | POA: Diagnosis not present

## 2019-11-29 DIAGNOSIS — Z227 Latent tuberculosis: Secondary | ICD-10-CM | POA: Diagnosis not present

## 2019-11-29 DIAGNOSIS — Z87440 Personal history of urinary (tract) infections: Secondary | ICD-10-CM | POA: Diagnosis not present

## 2019-11-29 DIAGNOSIS — S8990XD Unspecified injury of unspecified lower leg, subsequent encounter: Secondary | ICD-10-CM | POA: Diagnosis not present

## 2019-11-29 DIAGNOSIS — Z888 Allergy status to other drugs, medicaments and biological substances status: Secondary | ICD-10-CM | POA: Diagnosis not present

## 2019-11-29 DIAGNOSIS — Z8639 Personal history of other endocrine, nutritional and metabolic disease: Secondary | ICD-10-CM | POA: Diagnosis not present

## 2019-11-29 DIAGNOSIS — Z4822 Encounter for aftercare following kidney transplant: Secondary | ICD-10-CM | POA: Diagnosis not present

## 2019-11-30 DIAGNOSIS — M25562 Pain in left knee: Secondary | ICD-10-CM | POA: Diagnosis not present

## 2019-11-30 DIAGNOSIS — M6281 Muscle weakness (generalized): Secondary | ICD-10-CM | POA: Diagnosis not present

## 2019-11-30 DIAGNOSIS — M25662 Stiffness of left knee, not elsewhere classified: Secondary | ICD-10-CM | POA: Diagnosis not present

## 2019-11-30 DIAGNOSIS — R262 Difficulty in walking, not elsewhere classified: Secondary | ICD-10-CM | POA: Diagnosis not present

## 2019-12-07 DIAGNOSIS — R262 Difficulty in walking, not elsewhere classified: Secondary | ICD-10-CM | POA: Diagnosis not present

## 2019-12-07 DIAGNOSIS — S83512D Sprain of anterior cruciate ligament of left knee, subsequent encounter: Secondary | ICD-10-CM | POA: Diagnosis not present

## 2019-12-07 DIAGNOSIS — M25662 Stiffness of left knee, not elsewhere classified: Secondary | ICD-10-CM | POA: Diagnosis not present

## 2019-12-07 DIAGNOSIS — M6281 Muscle weakness (generalized): Secondary | ICD-10-CM | POA: Diagnosis not present

## 2019-12-13 ENCOUNTER — Ambulatory Visit: Payer: BC Managed Care – PPO

## 2019-12-13 DIAGNOSIS — M25662 Stiffness of left knee, not elsewhere classified: Secondary | ICD-10-CM | POA: Diagnosis not present

## 2019-12-13 DIAGNOSIS — S83512D Sprain of anterior cruciate ligament of left knee, subsequent encounter: Secondary | ICD-10-CM | POA: Diagnosis not present

## 2019-12-13 DIAGNOSIS — M6281 Muscle weakness (generalized): Secondary | ICD-10-CM | POA: Diagnosis not present

## 2019-12-13 DIAGNOSIS — R262 Difficulty in walking, not elsewhere classified: Secondary | ICD-10-CM | POA: Diagnosis not present

## 2019-12-17 ENCOUNTER — Emergency Department (HOSPITAL_COMMUNITY)
Admission: EM | Admit: 2019-12-17 | Discharge: 2019-12-18 | Disposition: A | Discharge status: Home / Self Care | Payer: BC Managed Care – PPO | Attending: Emergency Medicine | Admitting: Emergency Medicine

## 2019-12-17 ENCOUNTER — Emergency Department (HOSPITAL_COMMUNITY): Payer: BC Managed Care – PPO

## 2019-12-17 ENCOUNTER — Ambulatory Visit
Admission: EM | Admit: 2019-12-17 | Discharge: 2019-12-17 | Disposition: A | Discharge status: Home / Self Care | Payer: Medicare Other | Attending: Emergency Medicine | Admitting: Emergency Medicine

## 2019-12-17 ENCOUNTER — Encounter: Payer: Self-pay | Admitting: *Deleted

## 2019-12-17 ENCOUNTER — Encounter (HOSPITAL_COMMUNITY): Payer: Self-pay | Admitting: Emergency Medicine

## 2019-12-17 ENCOUNTER — Other Ambulatory Visit: Payer: Self-pay

## 2019-12-17 DIAGNOSIS — Z20822 Contact with and (suspected) exposure to covid-19: Secondary | ICD-10-CM | POA: Insufficient documentation

## 2019-12-17 DIAGNOSIS — R079 Chest pain, unspecified: Secondary | ICD-10-CM | POA: Insufficient documentation

## 2019-12-17 DIAGNOSIS — R0682 Tachypnea, not elsewhere classified: Secondary | ICD-10-CM

## 2019-12-17 DIAGNOSIS — Z940 Kidney transplant status: Secondary | ICD-10-CM

## 2019-12-17 DIAGNOSIS — R059 Cough, unspecified: Secondary | ICD-10-CM | POA: Diagnosis not present

## 2019-12-17 DIAGNOSIS — R0602 Shortness of breath: Secondary | ICD-10-CM | POA: Diagnosis not present

## 2019-12-17 DIAGNOSIS — Z87891 Personal history of nicotine dependence: Secondary | ICD-10-CM | POA: Insufficient documentation

## 2019-12-17 DIAGNOSIS — Z7982 Long term (current) use of aspirin: Secondary | ICD-10-CM | POA: Insufficient documentation

## 2019-12-17 DIAGNOSIS — I120 Hypertensive chronic kidney disease with stage 5 chronic kidney disease or end stage renal disease: Secondary | ICD-10-CM | POA: Diagnosis not present

## 2019-12-17 DIAGNOSIS — R109 Unspecified abdominal pain: Secondary | ICD-10-CM

## 2019-12-17 DIAGNOSIS — Z79899 Other long term (current) drug therapy: Secondary | ICD-10-CM | POA: Insufficient documentation

## 2019-12-17 DIAGNOSIS — J9811 Atelectasis: Secondary | ICD-10-CM | POA: Diagnosis not present

## 2019-12-17 DIAGNOSIS — R0600 Dyspnea, unspecified: Secondary | ICD-10-CM | POA: Diagnosis not present

## 2019-12-17 DIAGNOSIS — R0981 Nasal congestion: Secondary | ICD-10-CM | POA: Diagnosis not present

## 2019-12-17 DIAGNOSIS — R0789 Other chest pain: Secondary | ICD-10-CM | POA: Diagnosis not present

## 2019-12-17 DIAGNOSIS — Z992 Dependence on renal dialysis: Secondary | ICD-10-CM | POA: Diagnosis not present

## 2019-12-17 DIAGNOSIS — M545 Low back pain, unspecified: Secondary | ICD-10-CM | POA: Insufficient documentation

## 2019-12-17 DIAGNOSIS — M79604 Pain in right leg: Secondary | ICD-10-CM | POA: Diagnosis not present

## 2019-12-17 DIAGNOSIS — N186 End stage renal disease: Secondary | ICD-10-CM | POA: Diagnosis not present

## 2019-12-17 LAB — BASIC METABOLIC PANEL
Anion gap: 11 (ref 5–15)
BUN: 19 mg/dL (ref 6–20)
CO2: 19 mmol/L — ABNORMAL LOW (ref 22–32)
Calcium: 9.7 mg/dL (ref 8.9–10.3)
Chloride: 108 mmol/L (ref 98–111)
Creatinine, Ser: 1.28 mg/dL — ABNORMAL HIGH (ref 0.44–1.00)
GFR, Estimated: 49 mL/min — ABNORMAL LOW (ref >60)
Glucose, Bld: 100 mg/dL — ABNORMAL HIGH (ref 70–99)
Potassium: 4.5 mmol/L (ref 3.5–5.1)
Sodium: 138 mmol/L (ref 135–145)

## 2019-12-17 LAB — RESP PANEL BY RT-PCR (FLU A&B, COVID) ARPGX2
Influenza A by PCR: NEGATIVE
Influenza B by PCR: NEGATIVE
SARS Coronavirus 2 by RT PCR: NEGATIVE

## 2019-12-17 LAB — POCT URINALYSIS DIP (MANUAL ENTRY)
Bilirubin, UA: NEGATIVE
Blood, UA: NEGATIVE
Glucose, UA: NEGATIVE mg/dL
Ketones, POC UA: NEGATIVE mg/dL
Leukocytes, UA: NEGATIVE
Nitrite, UA: NEGATIVE
Protein Ur, POC: NEGATIVE mg/dL
Spec Grav, UA: 1.025 (ref 1.010–1.025)
Urobilinogen, UA: 0.2 E.U./dL
pH, UA: 6 (ref 5.0–8.0)

## 2019-12-17 LAB — CBC
HCT: 42.4 % (ref 36.0–46.0)
Hemoglobin: 13.4 g/dL (ref 12.0–15.0)
MCH: 30.7 pg (ref 26.0–34.0)
MCHC: 31.6 g/dL (ref 30.0–36.0)
MCV: 97 fL (ref 80.0–100.0)
Platelets: 239 10*3/uL (ref 150–400)
RBC: 4.37 MIL/uL (ref 3.87–5.11)
RDW: 13.4 % (ref 11.5–15.5)
WBC: 5.1 10*3/uL (ref 4.0–10.5)
nRBC: 0 % (ref 0.0–0.2)

## 2019-12-17 LAB — TROPONIN I (HIGH SENSITIVITY)
Troponin I (High Sensitivity): 5 ng/L (ref <18)
Troponin I (High Sensitivity): 6 ng/L (ref <18)

## 2019-12-17 MED ORDER — HYDROCODONE-ACETAMINOPHEN 5-325 MG PO TABS
1.0000 | ORAL_TABLET | Freq: Once | ORAL | Status: AC
  Administered 2019-12-17: 1 via ORAL
  Filled 2019-12-17: qty 1

## 2019-12-17 NOTE — ED Notes (Signed)
Pt to ED for Intermittent CP that is worse when deep breathing, SOB, right sided leg pain, and bilateral flank pain.

## 2019-12-17 NOTE — ED Triage Notes (Signed)
C/O constant mid chest pain and right flank pain "whenever I take a deep breath" over past 2 days.  C/O ongoing SOB - "was told with my kidney transplant it would get better, but it's getting worse".  C/O cough onset yesterday.  Denies fevers.
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
## 2019-12-17 NOTE — ED Notes (Addendum)
Nuclear Medicine called and stated that they are still awaiting pharmacy for dose for scan as the medication she had for her previously expired. She reports she is awaiting pharmacy's call and will come back to [facility] ED when she receives same. VQ scan may not be completed tonight. Phylliss Bob, [place] messaged to make aware of same.

## 2019-12-17 NOTE — ED Provider Notes (Signed)
I assumed care of patient at shift change from previous team, please see their note for full H&P. Briefly patient is awaiting VQ scan.  Covid test is ordered. If VQ scan is negative anticipate discharge home. If positive will require admission.  I was informed that the dose of medication for her VQ scan had previously expired and that they are awaiting pharmacies call to see if they can complete the VQ scan tonight or if it will have to be in the morning.  Patient signed out to [facility] PA-C who will follow patient and disposition if able to.   Note: Portions of this report may have been transcribed using voice recognition software. Every effort was made to ensure accuracy; however, inadvertent computerized transcription errors may be present      Ollen Gross 12/17/19 2352    Daleen Bo, MD 12/18/19 [phone]

## 2019-12-17 NOTE — ED Notes (Signed)
Pt difficult stick. IV team started PIV-- this RN unable to get labs off. Multiple staff members have attempted to draw repeat trop. Another IV team consult placed.

## 2019-12-17 NOTE — ED Provider Notes (Signed)
23:45: Care transitioned to me @ change of shift from Wyn Quaker PA-C pending VQ scan. Initially seen by attending Dr. Eulis Foster.    VQ scan unfortunately unable to be performed overnight & is still pending.   07:15: Patient care transitioned to [facility].    Amaryllis Dyke, PA-C 12/18/19 0715    Daleen Bo, MD 12/18/19 [phone]

## 2019-12-17 NOTE — ED Triage Notes (Addendum)
Pt reports constant pain to center of chest and SOB since Thanksgiving.  Also reports R arm and leg pain, lower back pain, chills, cough, and runny nose.  Denies fever.
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
## 2019-12-18 ENCOUNTER — Emergency Department (HOSPITAL_COMMUNITY): Payer: BC Managed Care – PPO

## 2019-12-18 DIAGNOSIS — R0602 Shortness of breath: Secondary | ICD-10-CM | POA: Diagnosis not present

## 2019-12-18 LAB — SARS-COV-2, NAA 2 DAY TAT

## 2019-12-18 LAB — NOVEL CORONAVIRUS, NAA: SARS-CoV-2, NAA: NOT DETECTED

## 2019-12-18 MED ORDER — OXYCODONE HCL 5 MG PO TABS
5.0000 mg | ORAL_TABLET | Freq: Four times a day (QID) | ORAL | 0 refills | Status: DC | PRN
Start: 2019-12-18 — End: 2019-12-22

## 2019-12-18 MED ORDER — HYDROCODONE-ACETAMINOPHEN 5-325 MG PO TABS: 1.0000 | ORAL_TABLET | Freq: Once | ORAL | Status: DC

## 2019-12-18 MED ORDER — TECHNETIUM TO 99M ALBUMIN AGGREGATED
4.4000 | Freq: Once | INTRAVENOUS | Status: AC | PRN
  Administered 2019-12-18: 4.4 via INTRAVENOUS

## 2019-12-18 NOTE — ED Notes (Signed)
This RN spoke with patient. Patient upset about missing three doses of her kidney medication due to being in ED so long. This RN apologized for the wait and informed patient that RN just spoke with Nuclear Medicine and was informed that they should be sending for the patient soon for scan.

## 2019-12-18 NOTE — Discharge Instructions (Signed)
Please read and follow all provided instructions.  Your diagnoses today include:  1. Dyspnea, unspecified type   2. Tachypnea   3. Right leg pain     Tests performed today include:  An EKG of your heart  A chest x-ray  Cardiac enzymes - a blood test for heart muscle damage  Blood counts and electrolytes  VQ scan of the chest -- no sign of blood clots in the lungs  Vital signs. See below for your results today.   Medications prescribed:   Oxycodone - narcotic pain medication  DO NOT drive or perform any activities that require you to be awake and alert because this medicine can make you drowsy.   Take any prescribed medications only as directed.  Follow-up instructions: Please follow-up with your primary care provider as soon as you can for further evaluation of your symptoms. This is important as they may consider getting an ultrasound of your leg to ensure no blood clots.   Return instructions:  SEEK IMMEDIATE MEDICAL ATTENTION IF:  You have severe chest pain, especially if the pain is crushing or pressure-like and spreads to the arms, back, neck, or jaw, or if you have sweating, nausea (feeling sick to your stomach), or shortness of breath. THIS IS AN EMERGENCY. Don't wait to see if the pain will go away. Get medical help at once. Call 911 or [phone] (operator). DO NOT drive yourself to the hospital.   Your chest pain gets worse and does not go away with rest.   You have an attack of chest pain lasting longer than usual, despite rest and treatment with the medications your caregiver has prescribed.   You wake from sleep with chest pain or shortness of breath.  You feel dizzy or faint.  You have chest pain not typical of your usual pain for which you originally saw your caregiver.   You have any other emergent concerns regarding your health.  Additional Information: Chest pain comes from many different causes. Your caregiver has diagnosed you as having chest pain that is  not specific for one problem, but does not require admission.  You are at low risk for an acute heart condition or other serious illness.   Your vital signs today were: BP 136/72 (BP Location: Right Arm)   Pulse 77   Temp 98.2 F (36.8 C) (Oral)   Resp (!) 23   Ht 5\' 6"  (1.676 m)   Wt 88.6 kg   SpO2 95%   BMI 31.53 kg/m  If your blood pressure (BP) was elevated above 135/85 this visit, please have this repeated by your doctor within one month. --------------

## 2019-12-18 NOTE — ED Notes (Signed)
Pt discharge instructions and follow-up care reviewed with the patient. The patient verbalized understanding of instructions. Pt discharged. 

## 2019-12-18 NOTE — ED Notes (Signed)
Pt to VQ scan.

## 2019-12-18 NOTE — ED Notes (Signed)
RN spoke with Nuclear Medicine Tech. Should send for patient soon for scan.

## 2019-12-18 NOTE — ED Provider Notes (Addendum)
9:39 AM Signout from [facility] PA-C at shift change.   Patient awaiting V/Q scan to evaluate for PE. Plan is d/c if negative.   V/Q scan completed and is read as negative.   I went and evaluated patient.  We discussed results.  She is not in any distress.  She states that she developed some pain in her right lower quadrant since last night, but is not significantly tender on exam.  She continues to complain of pain down into her right leg.  She reports hitting her calf on an area but pain shoots up into the upper leg at times.  On exam, lower extremities are symmetric and soft.   I offered pain medication for the patient and we discussed possibility of obtaining a lower extremity Doppler ultrasound to evaluate for blood clot in the leg, even though we do not see any clots in the chest.  Patient is extremely anxious about missing several doses now of her antirejection medication.  She is concerned about this and anxious to leave so that she can go home and take her medications.  She would prefer to go and see her MD tomorrow and tells me she tried to go see them Friday (two days ago) but were closed for the Thanksgiving holiday.  She is driving home so she will not be able to get any additional strong pain medications here.  She understands.  Discussed need for close primary care follow-up.  Discussed that she may need an ultrasound of her lower extremity to evaluate for DVT.  We discussed that if her symptoms worsen become she becomes more short of breath, syncope, lightheadedness, worsening chest pain or other concerns that she needs to return to the emergency department.  She states that she will.  BP 136/72 (BP Location: Right Arm)   Pulse 77   Temp 98.2 F (36.8 C) (Oral)   Resp (!) 23   Ht 5\' 6"  (1.676 m)   Wt 88.6 kg   SpO2 95%   BMI 31.53 kg/m     Carlisle Cater, PA-C 12/18/19 7622    Isla Pence, MD 12/18/19 [phone]

## 2019-12-18 NOTE — ED Notes (Signed)
Patient requesting food/PO fluids. Provider messaged regarding same.

## 2019-12-19 ENCOUNTER — Ambulatory Visit (INDEPENDENT_AMBULATORY_CARE_PROVIDER_SITE_OTHER): Payer: BC Managed Care – PPO | Admitting: Internal Medicine

## 2019-12-19 ENCOUNTER — Ambulatory Visit (HOSPITAL_COMMUNITY)
Admission: RE | Admit: 2019-12-19 | Discharge: 2019-12-19 | Disposition: A | Discharge status: Home / Self Care | Payer: BC Managed Care – PPO | Source: Ambulatory Visit | Attending: Student in an Organized Health Care Education/Training Program | Admitting: Student in an Organized Health Care Education/Training Program

## 2019-12-19 ENCOUNTER — Other Ambulatory Visit: Payer: Self-pay

## 2019-12-19 ENCOUNTER — Encounter: Payer: Self-pay | Admitting: Internal Medicine

## 2019-12-19 VITALS — BP 120/66 | HR 83 | Temp 97.6°F | Ht 66.0 in | Wt 193.3 lb

## 2019-12-19 DIAGNOSIS — M79604 Pain in right leg: Secondary | ICD-10-CM

## 2019-12-19 DIAGNOSIS — R0602 Shortness of breath: Secondary | ICD-10-CM

## 2019-12-19 DIAGNOSIS — J45909 Unspecified asthma, uncomplicated: Secondary | ICD-10-CM | POA: Insufficient documentation

## 2019-12-19 DIAGNOSIS — M25551 Pain in right hip: Secondary | ICD-10-CM | POA: Diagnosis not present

## 2019-12-19 DIAGNOSIS — M76891 Other specified enthesopathies of right lower limb, excluding foot: Secondary | ICD-10-CM | POA: Diagnosis not present

## 2019-12-19 DIAGNOSIS — R1031 Right lower quadrant pain: Secondary | ICD-10-CM | POA: Diagnosis not present

## 2019-12-19 NOTE — Patient Instructions (Signed)
Thank you for allowing [place] to provide your care today. Today we discussed your leg pain. I order ultrasound of leg to evaluate for any clot I also order a hip X ray. I will call if any are abnormal.    Today we made no changes to your medications.    Please follow-up in clinic as needed.  Should you have any questions or concerns please call the internal medicine clinic at [phone].
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
## 2019-12-19 NOTE — Assessment & Plan Note (Addendum)
We briefly discussed this issue and just before pt left the office.  She presented to ED on 11/27, due to right leg pain, chest pain and shortness of breath and abdominal pain. Troponin was negative, Covid test negative, VQ scan was negative for PE. Lower extremity Doppler ultrasound offered but patient declined (because of long waiting time in ED for patient.)  Patient was prescribed oxycodone and was discharged home.  She reports, this has been a chronic issue and started after her kidney transplant surgery on 02/2019. She reports some DOE. No orthopnea. No leg swelling.  Lung exam is unremarkable.  Volume status is normal on exam today. EKG, Troponin and CXR and VQ scan obtained in ED and were unremarkable. She also reports papitation. No dizziness. CBC w/o anemia.   -Ambulatory pulse Ox today in clinic>O2 sat 100% w ambulation and pt tolerated that well w/o symptoms. Unclear why she has SOB. I asked her to f/u in clinic next week to discuss this more vs ordering echo to make sure she does not have structural abnormality or heart failure. (She prefers to come back again before ordering echo).

## 2019-12-19 NOTE — Assessment & Plan Note (Signed)
Pt reports several days Hx of rt groin pain and 2-3 weeks Hx of rt calf pain.  The calf pain Mostly at rest. Repots possible trauma before this and had some bruise initially.  Ph/e: Rt hip: Internal and external rotation of hip reproduces the groin pain and also causes pain on her rt hip. No swelling of leg. No tenderness on her shin or bony structure but has some calf tenderness. Pulses are normal. Knee and ankle exam is unremarkable.   -For hip pain: Given she is on steroid chronically, and having pain w rt hip ROM, ordering X ray to evaluate for femoral head avascular necrosis -Her distal leg pain is non specific. Less likley to be Fx per my evaluation. Can not rule out DVT. Will order Doppler US.

## 2019-12-20 NOTE — Progress Notes (Signed)
Internal Medicine Clinic Attending  Case discussed with Dr. Masoudi  At the time of the visit.  We reviewed the resident's history and exam and pertinent patient test results.  I agree with the assessment, diagnosis, and plan of care documented in the resident's note.  

## 2019-12-22 ENCOUNTER — Encounter: Payer: Self-pay | Admitting: Student

## 2019-12-22 ENCOUNTER — Other Ambulatory Visit: Payer: Self-pay

## 2019-12-22 ENCOUNTER — Ambulatory Visit (INDEPENDENT_AMBULATORY_CARE_PROVIDER_SITE_OTHER): Payer: BC Managed Care – PPO | Admitting: Student

## 2019-12-22 ENCOUNTER — Telehealth: Payer: Self-pay | Admitting: *Deleted

## 2019-12-22 VITALS — BP 124/63 | HR 68 | Temp 98.4°F | Ht 66.0 in | Wt 194.6 lb

## 2019-12-22 DIAGNOSIS — R0602 Shortness of breath: Secondary | ICD-10-CM

## 2019-12-22 DIAGNOSIS — M79604 Pain in right leg: Secondary | ICD-10-CM

## 2019-12-22 LAB — BASIC METABOLIC PANEL
Anion gap: 10 (ref 5–15)
BUN: 19 mg/dL (ref 6–20)
CO2: 23 mmol/L (ref 22–32)
Calcium: 9.5 mg/dL (ref 8.9–10.3)
Chloride: 105 mmol/L (ref 98–111)
Creatinine, Ser: 1.5 mg/dL — ABNORMAL HIGH (ref 0.44–1.00)
GFR, Estimated: 40 mL/min — ABNORMAL LOW (ref >60)
Glucose, Bld: 126 mg/dL — ABNORMAL HIGH (ref 70–99)
Potassium: 4.4 mmol/L (ref 3.5–5.1)
Sodium: 138 mmol/L (ref 135–145)

## 2019-12-22 LAB — BRAIN NATRIURETIC PEPTIDE: B Natriuretic Peptide: 48 pg/mL (ref 0.0–100.0)

## 2019-12-22 MED ORDER — TRAMADOL HCL 50 MG PO TABS: 50.0000 mg | ORAL_TABLET | Freq: Every day | ORAL | 0 refills | Status: AC | PRN

## 2019-12-22 NOTE — Telephone Encounter (Signed)
Pt called / informed of PFT's - scheduled on Friday 12/17 @ 2 PM here at [facility]; arrive 10 -15 mins prior to register at the Admissions office. COVID test scheduled Tues 12/14 @ 3 PM at [facility] (drive thru). Also resp # given to pt in case she has to change date/time [phone]. Pt voiced understanding.
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
## 2019-12-22 NOTE — Assessment & Plan Note (Signed)
Patient complaining of shortness of breath that started in February and progressively worse, especially in the last few weeks.  Her shortness of breath is with exertion and better with rest.  States that walking from her bed to her bathroom make her very dyspneic.  Also endorses some chest tightness which is worse with breathing.  She complains of dry coughs that started a few weeks ago and resolved after the ED visit.  Denies sputum production or fever.  States that she has been gaining weight a lot.  Patient was seen in the ED on 11/27 for shortness of breath.  VQ scan is negative for PE, troponin flat, EKG is negative for ischemia, chest x-ray unremarkable and she is not anemic.  Per chart review patient had an echocardiogram in 2018 which showed normal EF.  She also have history of smoking 1 pack a day and quit 5 years ago.  Her PFT in 2018 was normal.  Patient also have latent TB infection which was treated with 9 months of isoniazid.  She states that she had a normal stress test about 2 years ago.  When ambulating, her oxygen dropped to the 70s and get back up in a few minutes.  Per RN, it could be inaccurate given difficulty of obtaining pulse ox due to her long nail.  Assessment: Her shortness of breath and chest tightness are concerning.  Patient is short of breath even with talking during examination.  Differentials including CAD versus COPD.  Given her weight gain, dry coughing and SOB, will obtain an echocardiogram to evaluate for her heart function.  Also send out referral for cardiology for possible ischemic work-up.  Her right hip pain is a limitation for exercise stress test.  Also obtaining PFTs given her history of smoking.  Today, obtain BNP and BMP.  Plan -Echocardiogram -Cardiology referral -PFTs -BNP -BMP
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
## 2019-12-22 NOTE — Patient Instructions (Signed)
Kristen Moreno, it is a pleasure seeing you today.  Here is a summary of what we talked about:  1.  Right leg pain: We will obtain an MRI of your right hip to rule out avascular necrosis.  In the meantime I prescribed 5-day supply of tramadol for your pain.  Please also proceed with the ultrasound of the lower leg to rule out blood clots.  2.  Shortness of breath: I will order an ultrasound of your heart to see how your heart is functioning and send out a referral to a cardiologist.  I will also order a pulmonary function test and a chest x-ray to see how your lungs are working.  We will collect some blood work today to see to rule out other causes as well.  Please call us for any questions or concerns.  It is shortness of breath or chest pain worsen, please go to the emergency room.  Take care  Dr. Alfonse Spruce

## 2019-12-26 ENCOUNTER — Ambulatory Visit (HOSPITAL_COMMUNITY)
Admission: RE | Admit: 2019-12-26 | Discharge: 2019-12-26 | Disposition: A | Discharge status: Home / Self Care | Payer: BC Managed Care – PPO | Source: Ambulatory Visit | Attending: Internal Medicine | Admitting: Internal Medicine

## 2019-12-26 ENCOUNTER — Other Ambulatory Visit: Payer: Self-pay

## 2019-12-26 ENCOUNTER — Ambulatory Visit (HOSPITAL_BASED_OUTPATIENT_CLINIC_OR_DEPARTMENT_OTHER)
Admission: RE | Admit: 2019-12-26 | Discharge: 2019-12-26 | Disposition: A | Discharge status: Home / Self Care | Payer: BC Managed Care – PPO | Source: Ambulatory Visit | Attending: Student in an Organized Health Care Education/Training Program | Admitting: Student in an Organized Health Care Education/Training Program

## 2019-12-26 DIAGNOSIS — M79604 Pain in right leg: Secondary | ICD-10-CM

## 2019-12-26 DIAGNOSIS — R0689 Other abnormalities of breathing: Secondary | ICD-10-CM | POA: Insufficient documentation

## 2019-12-26 DIAGNOSIS — R0602 Shortness of breath: Secondary | ICD-10-CM | POA: Insufficient documentation

## 2019-12-26 DIAGNOSIS — I10 Essential (primary) hypertension: Secondary | ICD-10-CM | POA: Insufficient documentation

## 2019-12-26 DIAGNOSIS — I313 Pericardial effusion (noninflammatory): Secondary | ICD-10-CM | POA: Diagnosis not present

## 2019-12-26 NOTE — Progress Notes (Signed)
Right lower extremity venous study completed.    Please see CV Proc for preliminary results.   Vonzell Schlatter, RVT Darlin Coco RDMS

## 2019-12-26 NOTE — Progress Notes (Signed)
Echocardiogram 2D Echocardiogram has been performed.  Kristen Moreno 12/26/2019, 4:02 PM

## 2019-12-27 ENCOUNTER — Ambulatory Visit: Payer: Medicare Other | Admitting: Cardiology

## 2019-12-27 DIAGNOSIS — D849 Immunodeficiency, unspecified: Secondary | ICD-10-CM | POA: Diagnosis not present

## 2019-12-27 DIAGNOSIS — Z5181 Encounter for therapeutic drug level monitoring: Secondary | ICD-10-CM | POA: Diagnosis not present

## 2019-12-27 DIAGNOSIS — Z79899 Other long term (current) drug therapy: Secondary | ICD-10-CM | POA: Diagnosis not present

## 2019-12-27 DIAGNOSIS — I10 Essential (primary) hypertension: Secondary | ICD-10-CM | POA: Diagnosis not present

## 2019-12-27 DIAGNOSIS — M79604 Pain in right leg: Secondary | ICD-10-CM | POA: Diagnosis not present

## 2019-12-27 DIAGNOSIS — Z940 Kidney transplant status: Secondary | ICD-10-CM | POA: Diagnosis not present

## 2019-12-27 LAB — ECHOCARDIOGRAM COMPLETE
Area-P 1/2: 2.34 cm2
S' Lateral: 2.1 cm

## 2019-12-28 ENCOUNTER — Encounter: Payer: Self-pay | Admitting: Student

## 2019-12-28 ENCOUNTER — Ambulatory Visit (INDEPENDENT_AMBULATORY_CARE_PROVIDER_SITE_OTHER): Payer: BC Managed Care – PPO | Admitting: Student

## 2019-12-28 DIAGNOSIS — R0602 Shortness of breath: Secondary | ICD-10-CM | POA: Diagnosis not present

## 2019-12-28 MED ORDER — FUROSEMIDE 20 MG PO TABS: 20.0000 mg | ORAL_TABLET | Freq: Every day | ORAL | 0 refills | Status: DC
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
## 2019-12-28 NOTE — Assessment & Plan Note (Addendum)
Patient is seen today in the office for follow-up of her dyspnea.  Patient states that her shortness of breath is stable but feels like she has been putting on weight.  Also states that her legs are getting bigger and feel tighter.  Physical exam is negative for crackles on lung auscultation, no JVD.  However appreciate +1 edema bilateral lower extremities which is new compared to last visit.  Her weight gone up from 1 94-1 96 since her last visit.  Echocardiogram showed normal systolic function with grade 2 diastolic and a small pericardial effusion without tamponade.  Normal right-sided pressure on echo.  Patient has a appointment with her cardiologist on 12/30/2019.  PFT to evaluate for possible underlying lung disease secondary to immunosuppressant medications.  Patient is on tacrolimus and mycophenolate after her renal transplant.  Assessment and plan; Given her ongoing shortness of breath, worsening lower extremity edema and weight gain, will start patient on low-dose Lasix 20 mg daily for 3 days until she see a cardiologist.  Will follow up with that appointment. -Start Lasix 20 mg daily for 3 days -Pending PFT -Follow-up with cardiology

## 2019-12-28 NOTE — Patient Instructions (Signed)
Ms. Hammersmith,  I will start a diuretic medication called Lasix 20 mg.  Please take 1 pill once a day for the next 3 days.  Please follow-up with your heart doctor for additional intervention for your shortness of breath.  Take care  Dr. Alfonse Spruce
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
## 2019-12-30 ENCOUNTER — Encounter: Payer: Self-pay | Admitting: Cardiology

## 2019-12-30 ENCOUNTER — Ambulatory Visit (INDEPENDENT_AMBULATORY_CARE_PROVIDER_SITE_OTHER): Payer: BC Managed Care – PPO | Admitting: Cardiology

## 2019-12-30 ENCOUNTER — Telehealth: Payer: Self-pay | Admitting: Radiology

## 2019-12-30 ENCOUNTER — Other Ambulatory Visit: Payer: Self-pay

## 2019-12-30 VITALS — BP 124/80 | HR 83 | Ht 66.0 in | Wt 192.4 lb

## 2019-12-30 DIAGNOSIS — R0602 Shortness of breath: Secondary | ICD-10-CM

## 2019-12-30 DIAGNOSIS — R002 Palpitations: Secondary | ICD-10-CM

## 2019-12-30 DIAGNOSIS — R600 Localized edema: Secondary | ICD-10-CM | POA: Diagnosis not present

## 2019-12-30 DIAGNOSIS — I10 Essential (primary) hypertension: Secondary | ICD-10-CM

## 2019-12-30 DIAGNOSIS — R079 Chest pain, unspecified: Secondary | ICD-10-CM | POA: Diagnosis not present

## 2019-12-30 MED ORDER — FUROSEMIDE 20 MG PO TABS: 20.0000 mg | ORAL_TABLET | Freq: Every day | ORAL | 3 refills | Status: DC | PRN

## 2019-12-30 NOTE — Patient Instructions (Signed)
Medication Instructions:  Take furosemide (Lasix) 65m AS NEEDED for weight increase of 3 lbs overnight or 5 lbs in 1 week  *If you need a refill on your cardiac medications before your next appointment, please call your pharmacy*   Lab Work: BMET, Mag, CRP, ESR today  If you have labs (blood work) drawn today and your tests are completely normal, you will receive your results only by: .Marland KitchenMyChart Message (if you have MyChart) OR . A paper copy in the mail If you have any lab test that is abnormal or we need to change your treatment, we will call you to review the results.   Testing/Procedures: Your physician has requested that you have a lexiscan myoview. For further information please visit [email or web address] Please follow instruction sheet, as given.   How to prepare for your Myocardial Perfusion Test:  Do not eat or drink 3 hours prior to your test, except you may have water.  Do not consume products containing caffeine (regular or decaffeinated) 12 hours prior to your test. (ex: coffee, chocolate, sodas, tea).  Do bring a list of your current medications with you.  If not listed below, you may take your medications as normal.  Do wear comfortable clothes (no dresses or overalls) and walking shoes, tennis shoes preferred (No heels or open toe shoes are allowed).  Do NOT wear cologne, perfume, aftershave, or lotions (deodorant is allowed).  The test will take approximately 3 to 4 hours to complete  If these instructions are not followed, your test will have to be rescheduled.  ZIO XT- Long Term Monitor Instructions   Your physician has requested you wear your ZIO patch monitor 7 days.   This is a single patch monitor.  [employer] supplies one patch monitor per enrollment.  Additional stickers are not available.   Please do not apply patch if you will be having a Nuclear Stress Test, Echocardiogram, Cardiac CT, MRI, or Chest Xray during the time frame you would be wearing the  monitor. The patch cannot be worn during these tests.  You cannot remove and re-apply the ZIO XT patch monitor.   Your ZIO patch monitor will [facility] home address. The monitor may also be mailed to a PO BOX if home delivery is not available.   It may take 3-5 days to receive your monitor after you have been enrolled.   Once you have received you monitor, please review enclosed instructions.  Your monitor has already been registered assigning a specific monitor serial # to you.   Applying the monitor   Shave hair from upper left chest.   Hold abrader disc by orange tab.  Rub abrader in 40 strokes over left upper chest as indicated in your monitor instructions.   Clean area with 4 enclosed alcohol pads .  Use all pads to assure are is cleaned thoroughly.  Let dry.   Apply patch as indicated in monitor instructions.  Patch will be place under collarbone on left side of chest with arrow pointing upward.   Rub patch adhesive wings for 2 minutes.Remove white label marked "1".  Remove white label marked "2".  Rub patch adhesive wings for 2 additional minutes.   While looking in a mirror, press and release button in center of patch.  A small green light will flash 3-4 times .  This will be your only indicator the monitor has been turned on.     Do not shower  for the first 24 hours.  You may shower after the first 24 hours.   Press button if you feel a symptom. You will hear a small click.  Record Date, Time and Symptom in the Patient Log Book.   When you are ready to remove patch, follow instructions on last 2 pages of Patient Log Book.  Stick patch monitor onto last page of Patient Log Book.   Place Patient Log Book in [place] box.  Use locking tab on box and tape box closed securely.  The [employer] has [employer] on it.  Please place in mailbox as soon as possible.  Your physician should have your test results  approximately 7 days after the monitor has been mailed back to [facility].   Call [place] at [phone] if you have questions regarding your ZIO XT patch monitor.  Call them immediately if you see an orange light blinking on your monitor.   If your monitor falls off in less than 4 days contact our Monitor department at [phone].  If your monitor becomes loose or falls off after 4 days call [employer] at [phone] for suggestions on securing your monitor.   Follow-Up: At [facility], you and your health needs are our priority.  As part of our continuing mission to provide you with exceptional heart care, we have created designated Provider Care Teams.  These Care Teams include your primary Cardiologist (physician) and Advanced Practice Providers (APPs -  Physician Assistants and Nurse Practitioners) who all work together to provide you with the care you need, when you need it.  We recommend signing up for the patient portal called "MyChart".  Sign up information is provided on this After Visit Summary.  MyChart is used to connect with patients for Virtual Visits (Telemedicine).  Patients are able to view lab/test results, encounter notes, upcoming appointments, etc.  Non-urgent messages can be sent to your provider as well.   To learn more about what you can do with MyChart, go to [email or web address].    Your next appointment:   3 month(s)  The format for your next appointment:   In Person  Provider:   Oswaldo Milian, MD

## 2019-12-30 NOTE — Telephone Encounter (Signed)
Enrolled patient for a 7 Day Zio XT monitor to be mailed to patients home  

## 2019-12-31 LAB — BASIC METABOLIC PANEL
BUN/Creatinine Ratio: 10 (ref 9–23)
BUN: 14 mg/dL (ref 6–24)
CO2: 21 mmol/L (ref 20–29)
Calcium: 9.5 mg/dL (ref 8.7–10.2)
Chloride: 105 mmol/L (ref 96–106)
Creatinine, Ser: 1.36 mg/dL — ABNORMAL HIGH (ref 0.57–1.00)
GFR calc Af Amer: 50 mL/min/{1.73_m2} — ABNORMAL LOW (ref >59)
GFR calc non Af Amer: 43 mL/min/{1.73_m2} — ABNORMAL LOW (ref >59)
Glucose: 117 mg/dL — ABNORMAL HIGH (ref 65–99)
Potassium: 4.7 mmol/L (ref 3.5–5.2)
Sodium: 141 mmol/L (ref 134–144)

## 2019-12-31 LAB — C-REACTIVE PROTEIN: CRP: 27 mg/L — ABNORMAL HIGH (ref 0–10)

## 2019-12-31 LAB — MAGNESIUM: Magnesium: 1.8 mg/dL (ref 1.6–2.3)

## 2019-12-31 LAB — SEDIMENTATION RATE: Sed Rate: 22 mm/hr (ref 0–40)

## 2020-01-02 ENCOUNTER — Ambulatory Visit (INDEPENDENT_AMBULATORY_CARE_PROVIDER_SITE_OTHER): Payer: BC Managed Care – PPO

## 2020-01-02 DIAGNOSIS — R002 Palpitations: Secondary | ICD-10-CM | POA: Diagnosis not present

## 2020-01-02 NOTE — Progress Notes (Signed)
Internal Medicine Clinic Attending  I saw and evaluated the patient.  I personally confirmed the key portions of the history and exam documented by Dr. Nguyen and I reviewed pertinent patient test results.  The assessment, diagnosis, and plan were formulated together and I agree with the documentation in the resident's note.\  

## 2020-01-03 ENCOUNTER — Other Ambulatory Visit (HOSPITAL_COMMUNITY)
Admission: RE | Admit: 2020-01-03 | Discharge: 2020-01-03 | Disposition: A | Discharge status: Home / Self Care | Payer: BC Managed Care – PPO | Source: Ambulatory Visit | Attending: Internal Medicine | Admitting: Internal Medicine

## 2020-01-03 DIAGNOSIS — Z20822 Contact with and (suspected) exposure to covid-19: Secondary | ICD-10-CM | POA: Insufficient documentation

## 2020-01-03 DIAGNOSIS — Z01812 Encounter for preprocedural laboratory examination: Secondary | ICD-10-CM | POA: Diagnosis not present

## 2020-01-03 LAB — SARS CORONAVIRUS 2 (TAT 6-24 HRS): SARS Coronavirus 2: NEGATIVE

## 2020-01-06 ENCOUNTER — Ambulatory Visit (HOSPITAL_COMMUNITY)
Admission: RE | Admit: 2020-01-06 | Discharge: 2020-01-06 | Disposition: A | Discharge status: Home / Self Care | Payer: BC Managed Care – PPO | Source: Ambulatory Visit | Attending: Internal Medicine | Admitting: Internal Medicine

## 2020-01-06 ENCOUNTER — Other Ambulatory Visit: Payer: Self-pay

## 2020-01-06 DIAGNOSIS — R0602 Shortness of breath: Secondary | ICD-10-CM | POA: Diagnosis not present

## 2020-01-06 LAB — PULMONARY FUNCTION TEST
DL/VA % pred: 85 %
DL/VA: 3.57 ml/min/mmHg/L
DLCO cor % pred: 65 %
DLCO cor: 14.32 ml/min/mmHg
DLCO unc % pred: 65 %
DLCO unc: 14.32 ml/min/mmHg
FEF 25-75 Post: 2.53 L/sec
FEF 25-75 Pre: 1.84 L/sec
FEF2575-%Change-Post: 37 %
FEF2575-%Pred-Post: 108 %
FEF2575-%Pred-Pre: 78 %
FEV1-%Change-Post: 14 %
FEV1-%Pred-Post: 85 %
FEV1-%Pred-Pre: 74 %
FEV1-Post: 2 L
FEV1-Pre: 1.74 L
FEV1FVC-%Change-Post: 0 %
FEV1FVC-%Pred-Pre: 101 %
FEV6-%Change-Post: 13 %
FEV6-%Pred-Post: 84 %
FEV6-%Pred-Pre: 74 %
FEV6-Post: 2.43 L
FEV6-Pre: 2.15 L
FEV6FVC-%Pred-Post: 103 %
FEV6FVC-%Pred-Pre: 103 %
FVC-%Change-Post: 14 %
FVC-%Pred-Post: 82 %
FVC-%Pred-Pre: 72 %
FVC-Post: 2.45 L
FVC-Pre: 2.15 L
Post FEV1/FVC ratio: 82 %
Post FEV6/FVC ratio: 100 %
Pre FEV1/FVC ratio: 81 %
Pre FEV6/FVC Ratio: 100 %
RV % pred: 80 %
RV: 1.63 L
TLC % pred: 82 %
TLC: 4.43 L

## 2020-01-06 MED ORDER — ALBUTEROL SULFATE (2.5 MG/3ML) 0.083% IN NEBU
2.5000 mg | INHALATION_SOLUTION | Freq: Once | RESPIRATORY_TRACT | Status: AC
  Administered 2020-01-06: 2.5 mg via RESPIRATORY_TRACT

## 2020-01-06 NOTE — Progress Notes (Signed)
Internal Medicine Clinic Attending  I saw and evaluated the patient.  I personally confirmed the key portions of the history and exam documented by Dr. Alfonse Spruce and I reviewed pertinent patient test results.  The assessment, diagnosis, and plan were formulated together and I agree with the documentation in the resident's note.  Discussed possible admission for work up of dyspnea, but she preferred to evaluate in the outpatient setting, plan as described by Dr. Alfonse Spruce.  Lenice Pressman, M.D., Ph.D.

## 2020-01-09 ENCOUNTER — Encounter: Payer: BC Managed Care – PPO | Admitting: Student

## 2020-01-10 ENCOUNTER — Ambulatory Visit (HOSPITAL_COMMUNITY)
Admission: RE | Admit: 2020-01-10 | Payer: Medicare Other | Source: Ambulatory Visit | Attending: Cardiology | Admitting: Cardiology

## 2020-01-10 ENCOUNTER — Telehealth (HOSPITAL_COMMUNITY): Payer: Self-pay | Admitting: *Deleted

## 2020-01-10 DIAGNOSIS — Z79899 Other long term (current) drug therapy: Secondary | ICD-10-CM | POA: Diagnosis not present

## 2020-01-10 DIAGNOSIS — Z940 Kidney transplant status: Secondary | ICD-10-CM | POA: Diagnosis not present

## 2020-01-10 NOTE — Telephone Encounter (Signed)
Close encounter 

## 2020-01-11 ENCOUNTER — Other Ambulatory Visit: Payer: Self-pay

## 2020-01-11 ENCOUNTER — Ambulatory Visit (HOSPITAL_COMMUNITY)
Admission: RE | Admit: 2020-01-11 | Discharge: 2020-01-11 | Disposition: A | Discharge status: Home / Self Care | Payer: BC Managed Care – PPO | Source: Ambulatory Visit | Attending: Cardiovascular Disease | Admitting: Cardiovascular Disease

## 2020-01-11 DIAGNOSIS — R0602 Shortness of breath: Secondary | ICD-10-CM | POA: Diagnosis not present

## 2020-01-11 DIAGNOSIS — R079 Chest pain, unspecified: Secondary | ICD-10-CM | POA: Diagnosis not present

## 2020-01-11 LAB — MYOCARDIAL PERFUSION IMAGING
LV dias vol: 73 mL (ref 46–106)
LV sys vol: 21 mL
Peak HR: 110 {beats}/min
Rest HR: 81 {beats}/min
SDS: 4
SRS: 1
SSS: 5
TID: 0.98

## 2020-01-11 MED ORDER — REGADENOSON 0.4 MG/5ML IV SOLN
0.4000 mg | Freq: Once | INTRAVENOUS | Status: AC
  Administered 2020-01-11: 0.4 mg via INTRAVENOUS

## 2020-01-11 MED ORDER — TECHNETIUM TC 99M TETROFOSMIN IV KIT
10.9000 | PACK | Freq: Once | INTRAVENOUS | Status: AC | PRN
  Administered 2020-01-11: 10.9 via INTRAVENOUS
  Filled 2020-01-11: qty 11

## 2020-01-11 MED ORDER — TECHNETIUM TC 99M TETROFOSMIN IV KIT
30.8000 | PACK | Freq: Once | INTRAVENOUS | Status: AC | PRN
  Administered 2020-01-11: 30.8 via INTRAVENOUS
  Filled 2020-01-11: qty 31

## 2020-01-24 ENCOUNTER — Ambulatory Visit (INDEPENDENT_AMBULATORY_CARE_PROVIDER_SITE_OTHER): Payer: BC Managed Care – PPO | Admitting: Internal Medicine

## 2020-01-24 ENCOUNTER — Other Ambulatory Visit: Payer: Self-pay

## 2020-01-24 ENCOUNTER — Encounter: Payer: Self-pay | Admitting: Internal Medicine

## 2020-01-24 VITALS — BP 140/74 | HR 83 | Temp 98.0°F | Ht 66.0 in | Wt 194.6 lb

## 2020-01-24 DIAGNOSIS — J439 Emphysema, unspecified: Secondary | ICD-10-CM

## 2020-01-24 DIAGNOSIS — R0602 Shortness of breath: Secondary | ICD-10-CM

## 2020-01-24 DIAGNOSIS — Z8619 Personal history of other infectious and parasitic diseases: Secondary | ICD-10-CM | POA: Diagnosis not present

## 2020-01-24 DIAGNOSIS — Z7952 Long term (current) use of systemic steroids: Secondary | ICD-10-CM | POA: Diagnosis not present

## 2020-01-24 DIAGNOSIS — Z79899 Other long term (current) drug therapy: Secondary | ICD-10-CM | POA: Diagnosis not present

## 2020-01-24 DIAGNOSIS — E785 Hyperlipidemia, unspecified: Secondary | ICD-10-CM | POA: Diagnosis not present

## 2020-01-24 DIAGNOSIS — D849 Immunodeficiency, unspecified: Secondary | ICD-10-CM | POA: Diagnosis not present

## 2020-01-24 DIAGNOSIS — Z87440 Personal history of urinary (tract) infections: Secondary | ICD-10-CM | POA: Diagnosis not present

## 2020-01-24 DIAGNOSIS — Z940 Kidney transplant status: Secondary | ICD-10-CM | POA: Diagnosis not present

## 2020-01-24 DIAGNOSIS — Z792 Long term (current) use of antibiotics: Secondary | ICD-10-CM | POA: Diagnosis not present

## 2020-01-24 DIAGNOSIS — E782 Mixed hyperlipidemia: Secondary | ICD-10-CM | POA: Diagnosis not present

## 2020-01-24 DIAGNOSIS — Z4822 Encounter for aftercare following kidney transplant: Secondary | ICD-10-CM | POA: Diagnosis not present

## 2020-01-24 DIAGNOSIS — I10 Essential (primary) hypertension: Secondary | ICD-10-CM | POA: Diagnosis not present

## 2020-01-24 DIAGNOSIS — Z8719 Personal history of other diseases of the digestive system: Secondary | ICD-10-CM | POA: Diagnosis not present

## 2020-01-24 IMAGING — MG DIGITAL SCREENING BILATERAL MAMMOGRAM WITH CAD
4 series · 4 of 4 positions shown · non-contrast
Comparison: Previous exam(s).

CLINICAL DATA: Screening.

EXAM:
DIGITAL SCREENING BILATERAL MAMMOGRAM WITH CAD

[L CC]
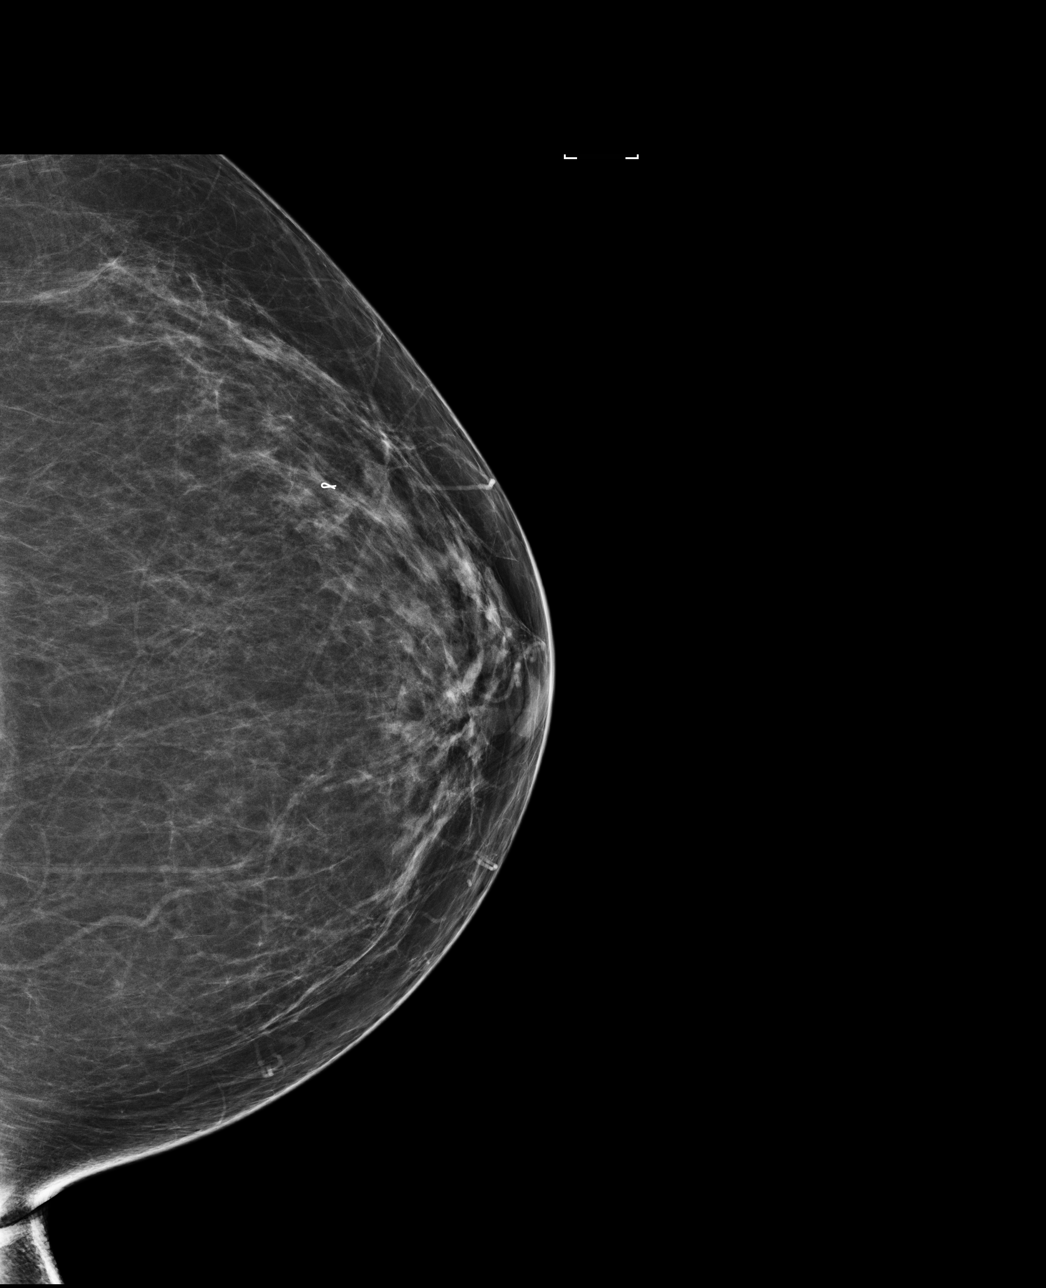

[R CC]
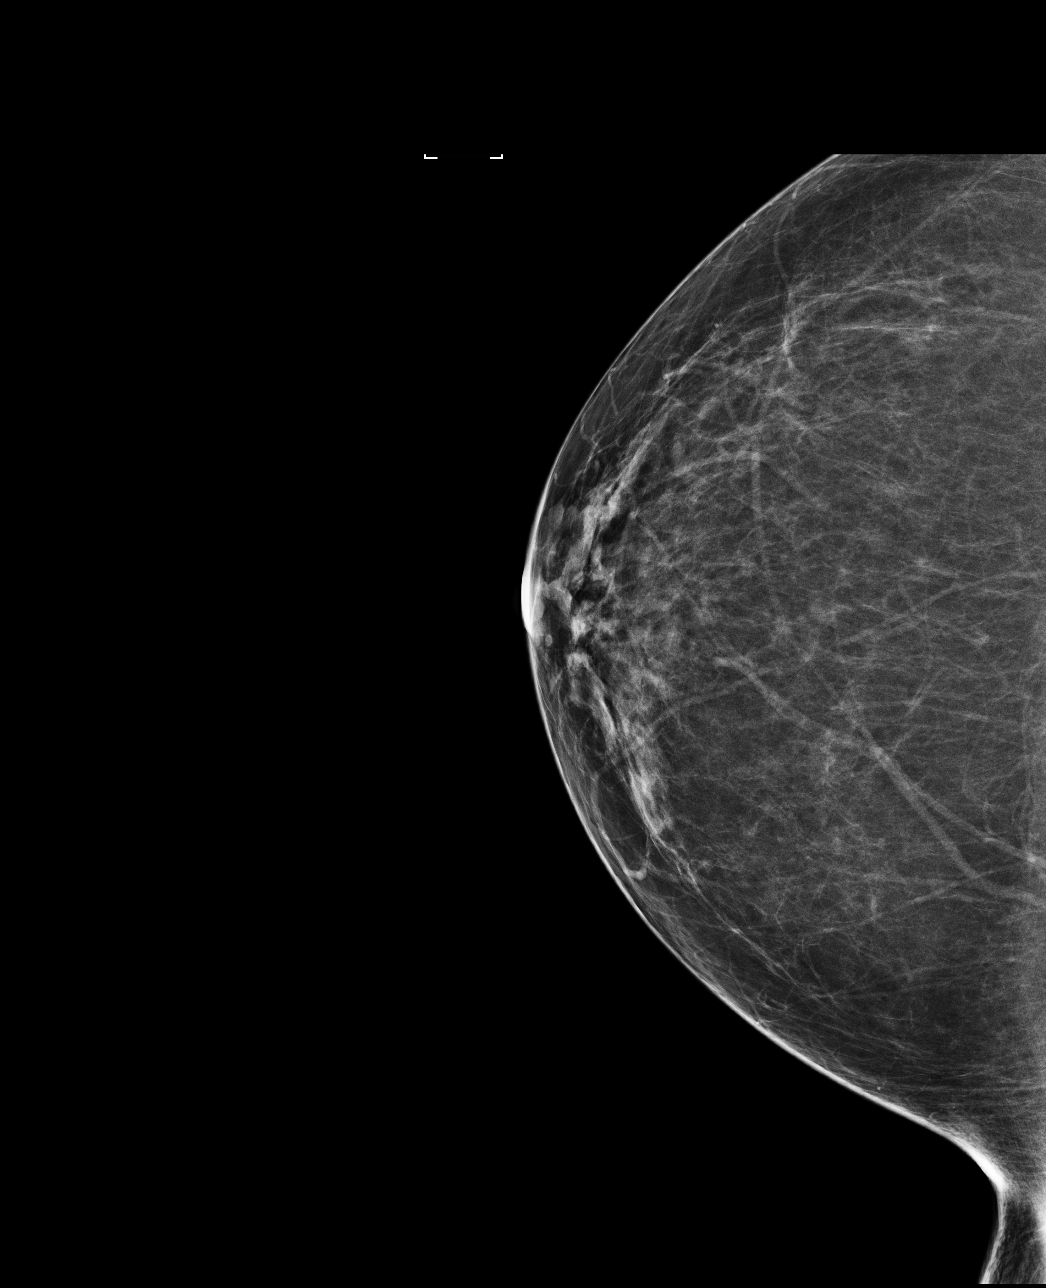

[L MLO]
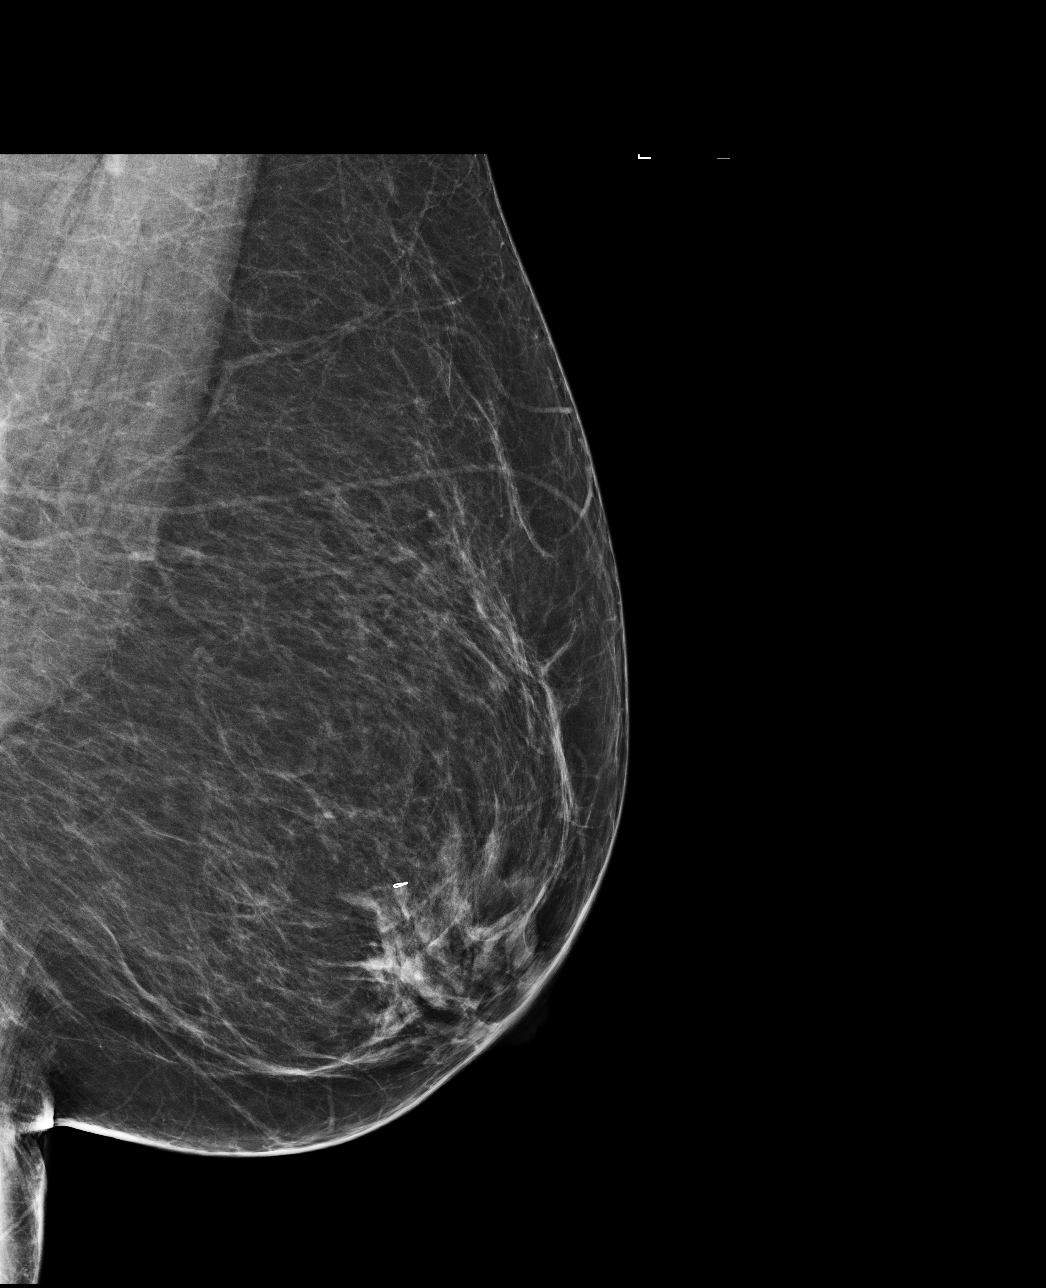

[R MLO]
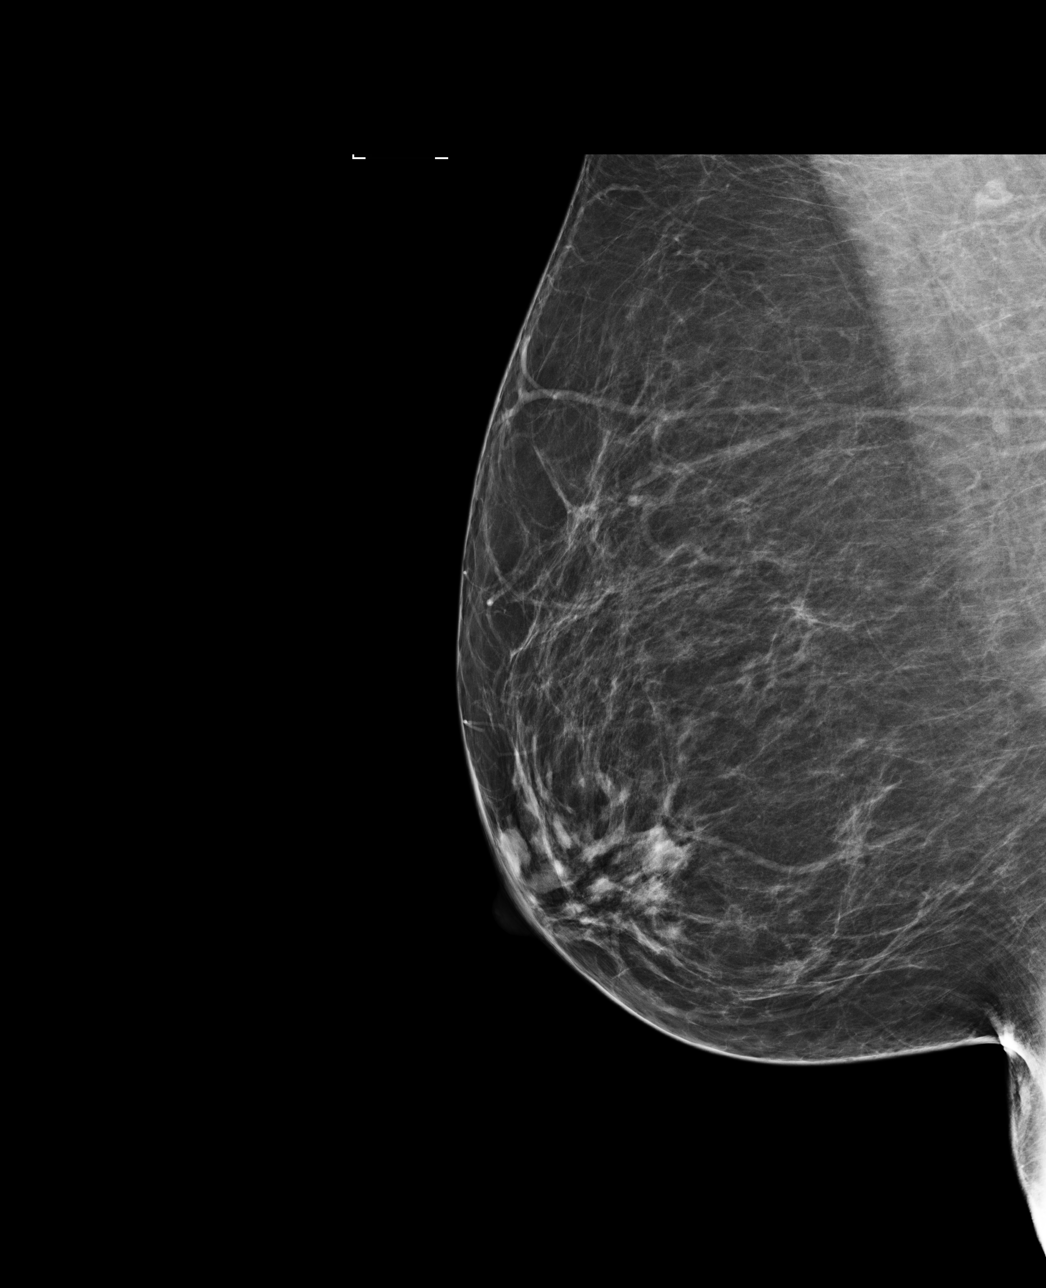

[4 of 4 positions shown; findings below may reference images not displayed]

ACR Breast Density Category c: The breast tissue is heterogeneously
dense, which may obscure small masses.
FINDINGS: There are no findings suspicious for malignancy. Images were
processed with CAD.
IMPRESSION: No mammographic evidence of malignancy. A result letter of this
screening mammogram will be mailed directly to the patient.

RECOMMENDATION:
Screening mammogram in one year. (Code:[id number])

BI-RADS CATEGORY  1: Negative.

## 2020-01-24 MED ORDER — ALBUTEROL SULFATE HFA 108 (90 BASE) MCG/ACT IN AERS: 1.0000 | INHALATION_SPRAY | Freq: Four times a day (QID) | RESPIRATORY_TRACT | 2 refills | Status: DC | PRN
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
## 2020-01-24 NOTE — Patient Instructions (Signed)
Kristen Moreno, It was nice meeting you! Today we discussed your shortness of breath. After all the testing done, your heart tests were normal which is good news. It does appear you have a mild obstructive lung disease which means you would benefit from using an inhaler as needed (albuterol).   I have sent this into your pharmacy, and you should notice a difference right away.   Take care, Dr. Koleen Distance

## 2020-01-26 ENCOUNTER — Encounter: Payer: Self-pay | Admitting: Internal Medicine

## 2020-01-26 DIAGNOSIS — J449 Chronic obstructive pulmonary disease, unspecified: Secondary | ICD-10-CM | POA: Insufficient documentation

## 2020-01-26 NOTE — Assessment & Plan Note (Signed)
Patient here today for follow-up after undergoing extensive work-up for dyspnea on exertion.  She was seen by cardiology. Lexiscan was performed which was unremarkable. Also wore an event monitor for a week. This has not been interpreted yet.  She had PFTs done which were consistent with obstructive pattern that showed significant response with bronchodilators.  I explained these findings to Kristen Moreno and the concept of an as needed rescue inhaler.  Will start Albuterol q 6 prn.

## 2020-01-27 ENCOUNTER — Other Ambulatory Visit: Payer: Self-pay | Admitting: Internal Medicine

## 2020-01-27 ENCOUNTER — Other Ambulatory Visit: Payer: Self-pay

## 2020-01-27 ENCOUNTER — Ambulatory Visit
Admission: RE | Admit: 2020-01-27 | Discharge: 2020-01-27 | Disposition: A | Discharge status: Home / Self Care | Payer: Medicare Other | Source: Ambulatory Visit | Attending: Internal Medicine | Admitting: Internal Medicine

## 2020-01-27 DIAGNOSIS — Z1231 Encounter for screening mammogram for malignant neoplasm of breast: Secondary | ICD-10-CM

## 2020-01-27 DIAGNOSIS — N63 Unspecified lump in unspecified breast: Secondary | ICD-10-CM

## 2020-01-27 NOTE — Progress Notes (Signed)
Internal Medicine Clinic Attending  Case discussed with Dr. Bloomfield  At the time of the visit.  We reviewed the resident's history and exam and pertinent patient test results.  I agree with the assessment, diagnosis, and plan of care documented in the resident's note.  

## 2020-02-08 ENCOUNTER — Encounter: Payer: Self-pay | Admitting: *Deleted

## 2020-02-16 ENCOUNTER — Telehealth: Payer: Self-pay | Admitting: *Deleted

## 2020-02-16 NOTE — Telephone Encounter (Signed)
Call from pt - states she went to the dentist to have tooth pulled but she was put on abx and cannot pull tooth until Tuesday of next week. States she's in a lot of pain and the dentist will not prescribe anything until she finds out from the pt's doctor what she can take that will not affect her kidneys. States the dentist, Nyra Capes, wants her doctor to call her @ [phone].

## 2020-02-21 DIAGNOSIS — M549 Dorsalgia, unspecified: Secondary | ICD-10-CM | POA: Diagnosis not present

## 2020-02-21 DIAGNOSIS — Z79899 Other long term (current) drug therapy: Secondary | ICD-10-CM | POA: Diagnosis not present

## 2020-02-21 DIAGNOSIS — Z5181 Encounter for therapeutic drug level monitoring: Secondary | ICD-10-CM | POA: Diagnosis not present

## 2020-02-21 DIAGNOSIS — Z4822 Encounter for aftercare following kidney transplant: Secondary | ICD-10-CM | POA: Diagnosis not present

## 2020-02-21 DIAGNOSIS — I151 Hypertension secondary to other renal disorders: Secondary | ICD-10-CM | POA: Diagnosis not present

## 2020-02-21 DIAGNOSIS — Z940 Kidney transplant status: Secondary | ICD-10-CM | POA: Diagnosis not present

## 2020-02-21 DIAGNOSIS — N2889 Other specified disorders of kidney and ureter: Secondary | ICD-10-CM | POA: Diagnosis not present

## 2020-02-21 DIAGNOSIS — R739 Hyperglycemia, unspecified: Secondary | ICD-10-CM | POA: Diagnosis not present

## 2020-02-21 DIAGNOSIS — R1031 Right lower quadrant pain: Secondary | ICD-10-CM | POA: Diagnosis not present

## 2020-02-21 NOTE — Telephone Encounter (Signed)
Attempted to call patient's dentist x2, left HIPAA compliant voicemail on 1/31 for dentist.

## 2020-02-22 ENCOUNTER — Other Ambulatory Visit: Payer: Self-pay

## 2020-02-22 ENCOUNTER — Emergency Department (HOSPITAL_COMMUNITY): Payer: BC Managed Care – PPO

## 2020-02-22 ENCOUNTER — Emergency Department (HOSPITAL_COMMUNITY)
Admission: EM | Admit: 2020-02-22 | Discharge: 2020-02-22 | Disposition: A | Discharge status: Home / Self Care | Payer: BC Managed Care – PPO | Attending: Emergency Medicine | Admitting: Emergency Medicine

## 2020-02-22 DIAGNOSIS — Z992 Dependence on renal dialysis: Secondary | ICD-10-CM | POA: Insufficient documentation

## 2020-02-22 DIAGNOSIS — Z79899 Other long term (current) drug therapy: Secondary | ICD-10-CM | POA: Diagnosis not present

## 2020-02-22 DIAGNOSIS — Z7982 Long term (current) use of aspirin: Secondary | ICD-10-CM | POA: Insufficient documentation

## 2020-02-22 DIAGNOSIS — K219 Gastro-esophageal reflux disease without esophagitis: Secondary | ICD-10-CM | POA: Insufficient documentation

## 2020-02-22 DIAGNOSIS — I120 Hypertensive chronic kidney disease with stage 5 chronic kidney disease or end stage renal disease: Secondary | ICD-10-CM | POA: Diagnosis not present

## 2020-02-22 DIAGNOSIS — N186 End stage renal disease: Secondary | ICD-10-CM | POA: Insufficient documentation

## 2020-02-22 DIAGNOSIS — M546 Pain in thoracic spine: Secondary | ICD-10-CM | POA: Diagnosis not present

## 2020-02-22 DIAGNOSIS — R079 Chest pain, unspecified: Secondary | ICD-10-CM | POA: Diagnosis not present

## 2020-02-22 DIAGNOSIS — N281 Cyst of kidney, acquired: Secondary | ICD-10-CM | POA: Diagnosis not present

## 2020-02-22 DIAGNOSIS — R109 Unspecified abdominal pain: Secondary | ICD-10-CM | POA: Insufficient documentation

## 2020-02-22 DIAGNOSIS — Z87891 Personal history of nicotine dependence: Secondary | ICD-10-CM | POA: Diagnosis not present

## 2020-02-22 LAB — CBC WITH DIFFERENTIAL/PLATELET
Abs Immature Granulocytes: 0.02 10*3/uL (ref 0.00–0.07)
Basophils Absolute: 0 10*3/uL (ref 0.0–0.1)
Basophils Relative: 0 %
Eosinophils Absolute: 0.1 10*3/uL (ref 0.0–0.5)
Eosinophils Relative: 1 %
HCT: 44.3 % (ref 36.0–46.0)
Hemoglobin: 13.8 g/dL (ref 12.0–15.0)
Immature Granulocytes: 0 %
Lymphocytes Relative: 12 %
Lymphs Abs: 0.7 10*3/uL (ref 0.7–4.0)
MCH: 30.5 pg (ref 26.0–34.0)
MCHC: 31.2 g/dL (ref 30.0–36.0)
MCV: 97.8 fL (ref 80.0–100.0)
Monocytes Absolute: 0.4 10*3/uL (ref 0.1–1.0)
Monocytes Relative: 6 %
Neutro Abs: 4.9 10*3/uL (ref 1.7–7.7)
Neutrophils Relative %: 81 %
Platelets: 232 10*3/uL (ref 150–400)
RBC: 4.53 MIL/uL (ref 3.87–5.11)
RDW: 13.5 % (ref 11.5–15.5)
WBC: 6.1 10*3/uL (ref 4.0–10.5)
nRBC: 0 % (ref 0.0–0.2)

## 2020-02-22 LAB — COMPREHENSIVE METABOLIC PANEL
ALT: 21 U/L (ref 0–44)
AST: 15 U/L (ref 15–41)
Albumin: 4.1 g/dL (ref 3.5–5.0)
Alkaline Phosphatase: 64 U/L (ref 38–126)
Anion gap: 11 (ref 5–15)
BUN: 21 mg/dL — ABNORMAL HIGH (ref 6–20)
CO2: 21 mmol/L — ABNORMAL LOW (ref 22–32)
Calcium: 9.5 mg/dL (ref 8.9–10.3)
Chloride: 109 mmol/L (ref 98–111)
Creatinine, Ser: 1.25 mg/dL — ABNORMAL HIGH (ref 0.44–1.00)
GFR, Estimated: 50 mL/min — ABNORMAL LOW (ref >60)
Glucose, Bld: 97 mg/dL (ref 70–99)
Potassium: 4 mmol/L (ref 3.5–5.1)
Sodium: 141 mmol/L (ref 135–145)
Total Bilirubin: 0.3 mg/dL (ref 0.3–1.2)
Total Protein: 7.8 g/dL (ref 6.5–8.1)

## 2020-02-22 LAB — URINALYSIS, ROUTINE W REFLEX MICROSCOPIC
Bilirubin Urine: NEGATIVE
Glucose, UA: NEGATIVE mg/dL
Hgb urine dipstick: NEGATIVE
Ketones, ur: NEGATIVE mg/dL
Leukocytes,Ua: NEGATIVE
Nitrite: NEGATIVE
Protein, ur: NEGATIVE mg/dL
Specific Gravity, Urine: 1.016 (ref 1.005–1.030)
pH: 5 (ref 5.0–8.0)

## 2020-02-22 LAB — LIPASE, BLOOD: Lipase: 32 U/L (ref 11–51)

## 2020-02-22 MED ORDER — HYDROCODONE-ACETAMINOPHEN 5-325 MG PO TABS
1.0000 | ORAL_TABLET | Freq: Once | ORAL | Status: AC
  Administered 2020-02-22: 1 via ORAL
  Filled 2020-02-22: qty 1

## 2020-02-22 MED ORDER — HYDROCODONE-ACETAMINOPHEN 5-325 MG PO TABS: 1.0000 | ORAL_TABLET | ORAL | 0 refills | Status: DC | PRN
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
## 2020-02-22 NOTE — ED Triage Notes (Signed)
Pt arrived via walk in, c/o bilateral back pain, radiating to right flank, worse with mvmt and heavy lifting. No trauma to area.

## 2020-02-22 NOTE — Discharge Instructions (Addendum)
Follow up with your Physician for recheck.  Return if any problems.  
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
## 2020-03-06 ENCOUNTER — Encounter: Payer: Self-pay | Admitting: Internal Medicine

## 2020-03-06 ENCOUNTER — Ambulatory Visit (INDEPENDENT_AMBULATORY_CARE_PROVIDER_SITE_OTHER): Payer: BC Managed Care – PPO | Admitting: Internal Medicine

## 2020-03-06 ENCOUNTER — Ambulatory Visit (HOSPITAL_COMMUNITY)
Admission: RE | Admit: 2020-03-06 | Discharge: 2020-03-06 | Disposition: A | Discharge status: Home / Self Care | Payer: BC Managed Care – PPO | Source: Ambulatory Visit | Attending: Internal Medicine | Admitting: Internal Medicine

## 2020-03-06 ENCOUNTER — Other Ambulatory Visit: Payer: Self-pay

## 2020-03-06 VITALS — BP 134/70 | HR 70 | Temp 98.2°F | Ht 66.0 in | Wt 189.3 lb

## 2020-03-06 DIAGNOSIS — E089 Diabetes mellitus due to underlying condition without complications: Secondary | ICD-10-CM

## 2020-03-06 DIAGNOSIS — E119 Type 2 diabetes mellitus without complications: Secondary | ICD-10-CM | POA: Insufficient documentation

## 2020-03-06 DIAGNOSIS — M546 Pain in thoracic spine: Secondary | ICD-10-CM

## 2020-03-06 DIAGNOSIS — I10 Essential (primary) hypertension: Secondary | ICD-10-CM | POA: Diagnosis not present

## 2020-03-06 DIAGNOSIS — Z7984 Long term (current) use of oral hypoglycemic drugs: Secondary | ICD-10-CM

## 2020-03-06 DIAGNOSIS — G8929 Other chronic pain: Secondary | ICD-10-CM

## 2020-03-06 DIAGNOSIS — M545 Low back pain, unspecified: Secondary | ICD-10-CM

## 2020-03-06 DIAGNOSIS — M419 Scoliosis, unspecified: Secondary | ICD-10-CM | POA: Insufficient documentation

## 2020-03-06 DIAGNOSIS — M4124 Other idiopathic scoliosis, thoracic region: Secondary | ICD-10-CM

## 2020-03-06 DIAGNOSIS — M549 Dorsalgia, unspecified: Secondary | ICD-10-CM | POA: Insufficient documentation

## 2020-03-06 MED ORDER — METHOCARBAMOL 500 MG PO TABS: 500.0000 mg | ORAL_TABLET | Freq: Three times a day (TID) | ORAL | 0 refills | Status: DC

## 2020-03-06 NOTE — Assessment & Plan Note (Signed)
Patient had an A1c obtained at [facility] office, A1c was 6.6 on 02/21/20.  She had been started on Metformin 750 mg ER daily and has obtained a glucometer.  Her glucometer readings have ranged around 1 30-1 40s, highest was 187. 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   -Continue metformin 750 mg daily -RTC in 3 months

## 2020-03-06 NOTE — Assessment & Plan Note (Signed)
Patient was seen in the ER on 02/22/2020 for symptoms of back pain, reported it was located around the thoracic area, constant in nature, radiated to her right side.  Work-up including urinalysis, lipase, CBC, and CMP were all unremarkable, creatinine and electrolytes were stable.  Abdominal ultrasound showed polycystic morphology of kidneys, with the right lower quadrant renal transplant unremarkable, small right pleural effusion.  Patient was given a prescription of Norco.  Today she reports she continues to have back pain.  She states that she continues to have back pain.  She states that it started about 1 month ago, came on suddenly, located in the middle of her back and radiates over to the right side, it is a 10 out of 10 at its worse, currently a 4-10.  It is worse with deep breaths and when she rotates to the left.  She also states that it will occasionally wake her up at night.  She has been taking Tylenol and will occasionally take ibuprofen which she states helps.  She has not been taking the Norco, is trying to avoid opiate medications.  She denies any fevers, trauma, weakness, numbness, tingling, weight loss, chest pain, shortness of breath, or cough.  On exam, her vitals are stable.  Cardiac and pulmonary exam unremarkable, no crackles noted.  She has no tenderness to palpation over her lumbar, cervical, or thoracic spine, no tenderness to palpation over entire back, has pain with rotation or chest to the left.  Patient does endorse some red flag symptoms, including chronic steroid use, immunosuppression, and nocturnal back pain.  Reported as sudden onset of back pain that is worse with movements and deep breaths.  Her neurological exam is intact.  We will evaluate for vertebral compression fracture with thoracic x-ray.  There is a low suspicion for infection, however will obtain ESR and CRP.  If ESR or CRP are elevated, or abnormal findings are noted on x-ray, may obtain MRI.  We will trial her  on Robaxin in case there is component of musculoskeletal pain.  -ESR, CRP -Thoracic spine x-ray -Robaxin 500 mg 3 times daily x7 days

## 2020-03-06 NOTE — Patient Instructions (Addendum)
Ms. Kristen Moreno,  It was a pleasure to see you today. Thank you for coming in.   Today we discussed your back pain. In regards to this I have placed some lab orders to evaluate.  I have also ordered an x-ray of your spine.  You can continue taking the Tylenol as needed, please limit ibuprofen use.  I have prescribed a muscle relaxer that she can take for the next week.  We also discussed your elevated sugars.  Please continue your current medication.    Please return to clinic in 3 months or sooner if needed.   Thank you again for coming in.   Asencion Noble.D.

## 2020-03-07 DIAGNOSIS — M419 Scoliosis, unspecified: Secondary | ICD-10-CM | POA: Insufficient documentation

## 2020-03-07 LAB — SEDIMENTATION RATE: Sed Rate: 48 mm/hr — ABNORMAL HIGH (ref 0–40)

## 2020-03-07 LAB — C-REACTIVE PROTEIN: CRP: 5 mg/L (ref 0–10)

## 2020-03-07 NOTE — Addendum Note (Signed)
Addended by: Asencion Noble on: 03/07/2020 05:02 PM   Modules accepted: Orders

## 2020-03-12 DIAGNOSIS — M545 Low back pain, unspecified: Secondary | ICD-10-CM | POA: Diagnosis not present

## 2020-03-14 NOTE — Progress Notes (Signed)
Internal Medicine Clinic Attending ° °Case discussed with Dr. Krienke  At the time of the visit.  We reviewed the resident’s history and exam and pertinent patient test results.  I agree with the assessment, diagnosis, and plan of care documented in the resident’s note.  °

## 2020-03-17 DIAGNOSIS — M545 Low back pain, unspecified: Secondary | ICD-10-CM | POA: Diagnosis not present

## 2020-03-17 DIAGNOSIS — M546 Pain in thoracic spine: Secondary | ICD-10-CM | POA: Diagnosis not present

## 2020-03-20 DIAGNOSIS — Z4822 Encounter for aftercare following kidney transplant: Secondary | ICD-10-CM | POA: Diagnosis not present

## 2020-03-20 DIAGNOSIS — I10 Essential (primary) hypertension: Secondary | ICD-10-CM | POA: Diagnosis not present

## 2020-03-20 DIAGNOSIS — E119 Type 2 diabetes mellitus without complications: Secondary | ICD-10-CM | POA: Diagnosis not present

## 2020-03-20 DIAGNOSIS — D849 Immunodeficiency, unspecified: Secondary | ICD-10-CM | POA: Diagnosis not present

## 2020-03-20 DIAGNOSIS — Z940 Kidney transplant status: Secondary | ICD-10-CM | POA: Diagnosis not present

## 2020-03-23 DIAGNOSIS — M545 Low back pain, unspecified: Secondary | ICD-10-CM | POA: Diagnosis not present

## 2020-04-04 ENCOUNTER — Ambulatory Visit: Payer: BC Managed Care – PPO | Attending: Internal Medicine | Admitting: Physical Therapy

## 2020-04-04 ENCOUNTER — Encounter: Payer: Self-pay | Admitting: Physical Therapy

## 2020-04-04 ENCOUNTER — Other Ambulatory Visit: Payer: Self-pay

## 2020-04-04 DIAGNOSIS — M545 Low back pain, unspecified: Secondary | ICD-10-CM | POA: Insufficient documentation

## 2020-04-04 DIAGNOSIS — M542 Cervicalgia: Secondary | ICD-10-CM | POA: Insufficient documentation

## 2020-04-04 DIAGNOSIS — M546 Pain in thoracic spine: Secondary | ICD-10-CM | POA: Insufficient documentation

## 2020-04-04 DIAGNOSIS — M5431 Sciatica, right side: Secondary | ICD-10-CM | POA: Diagnosis not present

## 2020-04-04 DIAGNOSIS — M5412 Radiculopathy, cervical region: Secondary | ICD-10-CM | POA: Insufficient documentation

## 2020-04-04 NOTE — Patient Instructions (Signed)
Access Code: [email or web address] URL: [email or web address] Date: 04/04/2020 Prepared by: Caleb Popp  Exercises  Seated Slump Nerve Glide - 1 x daily - 7 x weekly - 2 sets - 10 reps - 2 hold Standing Hip Extension with Counter Support - 1 x daily - 7 x weekly - 2 sets - 10 reps Standing Hip Abduction with Counter Support - 1 x daily - 7 x weekly - 2 sets - 5 reps
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
## 2020-04-09 DIAGNOSIS — N185 Chronic kidney disease, stage 5: Secondary | ICD-10-CM | POA: Diagnosis not present

## 2020-04-11 DIAGNOSIS — M545 Low back pain, unspecified: Secondary | ICD-10-CM | POA: Diagnosis not present

## 2020-04-18 ENCOUNTER — Ambulatory Visit: Payer: BC Managed Care – PPO

## 2020-04-18 DIAGNOSIS — Z940 Kidney transplant status: Secondary | ICD-10-CM | POA: Diagnosis not present

## 2020-04-18 DIAGNOSIS — D631 Anemia in chronic kidney disease: Secondary | ICD-10-CM | POA: Diagnosis not present

## 2020-04-18 DIAGNOSIS — N189 Chronic kidney disease, unspecified: Secondary | ICD-10-CM | POA: Diagnosis not present

## 2020-04-18 DIAGNOSIS — I129 Hypertensive chronic kidney disease with stage 1 through stage 4 chronic kidney disease, or unspecified chronic kidney disease: Secondary | ICD-10-CM | POA: Diagnosis not present

## 2020-04-24 ENCOUNTER — Other Ambulatory Visit: Payer: Self-pay

## 2020-04-24 ENCOUNTER — Ambulatory Visit: Payer: BC Managed Care – PPO | Attending: Internal Medicine

## 2020-04-24 DIAGNOSIS — M5412 Radiculopathy, cervical region: Secondary | ICD-10-CM | POA: Insufficient documentation

## 2020-04-24 DIAGNOSIS — M542 Cervicalgia: Secondary | ICD-10-CM | POA: Diagnosis present

## 2020-04-24 DIAGNOSIS — M546 Pain in thoracic spine: Secondary | ICD-10-CM | POA: Diagnosis present

## 2020-04-24 DIAGNOSIS — M545 Low back pain, unspecified: Secondary | ICD-10-CM | POA: Diagnosis not present

## 2020-04-24 DIAGNOSIS — M5431 Sciatica, right side: Secondary | ICD-10-CM | POA: Insufficient documentation
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
## 2020-04-26 ENCOUNTER — Ambulatory Visit: Payer: BC Managed Care – PPO

## 2020-04-26 ENCOUNTER — Other Ambulatory Visit: Payer: Self-pay

## 2020-04-26 DIAGNOSIS — M545 Low back pain, unspecified: Secondary | ICD-10-CM | POA: Diagnosis not present
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
## 2020-05-01 ENCOUNTER — Ambulatory Visit: Payer: BC Managed Care – PPO

## 2020-05-01 ENCOUNTER — Other Ambulatory Visit: Payer: Self-pay

## 2020-05-01 DIAGNOSIS — M545 Low back pain, unspecified: Secondary | ICD-10-CM

## 2020-05-01 DIAGNOSIS — M5431 Sciatica, right side: Secondary | ICD-10-CM

## 2020-05-01 DIAGNOSIS — M5412 Radiculopathy, cervical region: Secondary | ICD-10-CM

## 2020-05-01 DIAGNOSIS — M546 Pain in thoracic spine: Secondary | ICD-10-CM

## 2020-05-01 DIAGNOSIS — M542 Cervicalgia: Secondary | ICD-10-CM
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
## 2020-05-03 ENCOUNTER — Ambulatory Visit: Payer: BC Managed Care – PPO

## 2020-05-03 ENCOUNTER — Other Ambulatory Visit: Payer: Self-pay

## 2020-05-03 DIAGNOSIS — M546 Pain in thoracic spine: Secondary | ICD-10-CM

## 2020-05-03 DIAGNOSIS — M5431 Sciatica, right side: Secondary | ICD-10-CM

## 2020-05-03 DIAGNOSIS — M5412 Radiculopathy, cervical region: Secondary | ICD-10-CM

## 2020-05-03 DIAGNOSIS — M542 Cervicalgia: Secondary | ICD-10-CM

## 2020-05-03 DIAGNOSIS — M545 Low back pain, unspecified: Secondary | ICD-10-CM | POA: Diagnosis not present
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
## 2020-05-08 ENCOUNTER — Ambulatory Visit: Payer: BC Managed Care – PPO

## 2020-05-08 ENCOUNTER — Other Ambulatory Visit: Payer: Self-pay

## 2020-05-08 DIAGNOSIS — M545 Low back pain, unspecified: Secondary | ICD-10-CM | POA: Diagnosis not present

## 2020-05-08 DIAGNOSIS — M5412 Radiculopathy, cervical region: Secondary | ICD-10-CM

## 2020-05-08 DIAGNOSIS — M546 Pain in thoracic spine: Secondary | ICD-10-CM

## 2020-05-08 DIAGNOSIS — M5431 Sciatica, right side: Secondary | ICD-10-CM

## 2020-05-08 DIAGNOSIS — M542 Cervicalgia: Secondary | ICD-10-CM
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
## 2020-05-10 ENCOUNTER — Ambulatory Visit: Payer: BC Managed Care – PPO

## 2020-05-10 ENCOUNTER — Other Ambulatory Visit: Payer: Self-pay

## 2020-05-10 DIAGNOSIS — M546 Pain in thoracic spine: Secondary | ICD-10-CM

## 2020-05-10 DIAGNOSIS — M542 Cervicalgia: Secondary | ICD-10-CM

## 2020-05-10 DIAGNOSIS — M545 Low back pain, unspecified: Secondary | ICD-10-CM | POA: Diagnosis not present

## 2020-05-10 DIAGNOSIS — M5431 Sciatica, right side: Secondary | ICD-10-CM

## 2020-05-10 DIAGNOSIS — M5412 Radiculopathy, cervical region: Secondary | ICD-10-CM
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
## 2020-05-15 ENCOUNTER — Ambulatory Visit (INDEPENDENT_AMBULATORY_CARE_PROVIDER_SITE_OTHER): Payer: BC Managed Care – PPO | Admitting: Internal Medicine

## 2020-05-15 ENCOUNTER — Encounter: Payer: Self-pay | Admitting: Internal Medicine

## 2020-05-15 ENCOUNTER — Ambulatory Visit: Payer: BC Managed Care – PPO

## 2020-05-15 ENCOUNTER — Other Ambulatory Visit: Payer: Self-pay

## 2020-05-15 VITALS — BP 135/76 | HR 84 | Temp 97.9°F | Ht 66.0 in | Wt 190.3 lb

## 2020-05-15 DIAGNOSIS — D849 Immunodeficiency, unspecified: Secondary | ICD-10-CM | POA: Diagnosis not present

## 2020-05-15 DIAGNOSIS — M546 Pain in thoracic spine: Secondary | ICD-10-CM

## 2020-05-15 DIAGNOSIS — M7521 Bicipital tendinitis, right shoulder: Secondary | ICD-10-CM | POA: Diagnosis not present

## 2020-05-15 DIAGNOSIS — M5431 Sciatica, right side: Secondary | ICD-10-CM

## 2020-05-15 DIAGNOSIS — M4124 Other idiopathic scoliosis, thoracic region: Secondary | ICD-10-CM

## 2020-05-15 DIAGNOSIS — M542 Cervicalgia: Secondary | ICD-10-CM

## 2020-05-15 DIAGNOSIS — M5412 Radiculopathy, cervical region: Secondary | ICD-10-CM

## 2020-05-15 DIAGNOSIS — M545 Low back pain, unspecified: Secondary | ICD-10-CM | POA: Diagnosis not present

## 2020-05-15 HISTORY — DX: Bicipital tendinitis, right shoulder: M75.21

## 2020-05-15 NOTE — Assessment & Plan Note (Signed)
Previously had ED visit 2 months prior for back pain. X-ray of back showed scoliosis. Have been following with physical therapy regularly for pain with improvement in sxs. Continues to have ongoing physical therapy for back pain.  - F/u with PT

## 2020-05-15 NOTE — Assessment & Plan Note (Addendum)
Kristen Moreno is a 60 yo F w/ PMH of PCKD s/p transplant on chronic immuno suppresion,, GERD, DM, presenting to [facility] w/ complaint of acute exacerbation of chronic shoulder pain. She mentions that she has been having worsening pain of her right shoulder ever since she got her booster shot in September. She denies any other trauma or falls. She mentions that recently she has been following Kristen Moreno for her back and shoulder pain and has been prescribed pain medications which have helped. She denies any fevers, chills, nausea, vomiting. She mentions being concerned due to worsening weakness with the pain of her left shoulder. She mentions being told that she needs another referral for orthopedic surgery as this is for a different complaint than her knee or back pain.  A/P Presenting w/ acute on chronic left shoulder pain. Physical exam suggest biceps tendonitis vs tear. Concerning for tear with new onset weakness. Already has established care with orthopedic surgery. Will make referral per request. - F/u with orthopedic surgery
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
## 2020-05-15 NOTE — Patient Instructions (Addendum)
Dear [name],  Thank you for allowing [place] to provide your care today. Today we discussed your shoulder pain    I have ordered no labs for you. I will call if any are abnormal.    Today we made no changes to your medications:    Please follow-up as needed .    Please call the internal medicine center clinic if you have any questions or concerns, we may be able to help and keep you from a long and expensive emergency room wait. Our clinic and after hours phone number is [phone], the best time to call is Monday through Friday 9 am to 4 pm but there is always someone available 24/7 if you have an emergency. If you need medication refills please notify your pharmacy one week in advance and they will send [place] a request.    If you have not gotten the COVID vaccine, I recommend doing so:  You may get it at your local [facility] or [facility] OR To schedule an appointment for a COVID vaccine or be added to the vaccine wait list: Go to [email or web address]   OR Go to [email or web address]                  OR Call [phone]                                     OR Call [phone] and select Option 2  Thank you for choosing [facility]   Proximal Biceps Tendinitis and Tenosynovitis  The proximal biceps tendon is a strong cord of tissue that connects the biceps muscle on the front of the upper arm to the shoulder blade. Tendinitis is inflammation of a tendon. Tenosynovitis is inflammation of the lining around the tendon (tendon sheath). These conditions often occur at the same time, and they can interfere with the ability to bend the elbow and turn the palm of the hand up. Proximal biceps tendinitis and tenosynovitis are usually caused by overusing the shoulder joint and the biceps muscle. These conditions usually heal within 6 weeks. Proximal biceps tendinitis may include a grade 1 or grade 2 strain of the tendon.  A grade 1 strain is mild, and it involves a slight pull of  the tendon without any stretching or noticeable tearing of the tendon. There is usually no loss of biceps muscle strength.  A grade 2 strain is moderate, and it involves a small tear in the tendon. The tendon is stretched, and biceps strength is usually decreased. What are the causes? This condition may be caused by:  A sudden increase in frequency or intensity of activity that involves the shoulder and the biceps muscle.  Overuse of the biceps muscle. This can happen when you do the same movements over and over, such as: ? Turning the palm of the hand up. ? Forceful straightening (hyperextension) of the elbow. ? Bending the elbow.  A direct, forceful hit or injury to the elbow. This is rare. What increases the risk? The following factors may make you more likely to develop this condition:  Playing contact sports.  Playing sports that involve throwing and overhead movements, including racket sports, gymnastics, weight lifting, or bodybuilding.  Doing physical labor.  Having poor strength and flexibility of the arm and shoulder. What are the signs or symptoms? Symptoms of this condition may include:  Pain and inflammation in the front of the shoulder.  A feeling of warmth in the front of the shoulder.  Limited range of motion of the shoulder and the elbow.  A crackling sound (crepitation) when you move or touch the shoulder or the upper arm. In some cases, symptoms may return after treatment, and they may be long-lasting (chronic). How is this diagnosed? This condition is diagnosed based on:  Your symptoms.  Your medical history.  Physical exam.  X-ray or MRI, if needed. How is this treated? Treatment for this condition depends on the severity of your injury. It may include:  Resting the injured arm.  Icing the injured area.  Doing physical therapy. Your health care provider may also use:  Medicines to treat pain and inflammation.  Sound waves to treat the  injured muscle (ultrasound therapy).  Medicines that are injected to the muscle (corticosteroids).  Medicines that numb the area (local anesthetics).  Surgery. This is done if other treatments have not worked. Follow these instructions at home: Managing pain, stiffness, and swelling  If directed, put ice on the injured area. ? Put ice in a plastic bag. ? Place a towel between your skin and the bag. ? Leave the ice on for 20 minutes, 2-3 times a day.  If directed, apply heat to the affected area before you exercise. Use the heat source that your health care provider recommends, such as a moist heat pack or a heating pad. ? Place a towel between your skin and the heat source. ? Leave the heat on for 20-30 minutes. ? Remove the heat if your skin turns bright red. This is especially important if you are unable to feel pain, heat, or cold. You may have a greater risk of getting burned.  Move your fingers often to reduce stiffness and swelling.  Raise (elevate) the injured area above the level of your heart while you are lying down.      Activity  Do not lift anything that is heavier than 10 lb (4.5 kg), or the limit that you are told, until your health care provider says that it is safe.  Avoid activities that cause pain or make your condition worse.  Return to your normal activities as told by your health care provider. Ask your health care provider what activities are safe for you.  Do exercises as told by your health care provider. General instructions  Take over-the-counter and prescription medicines only as told by your health care provider.  Do not use any products that contain nicotine or tobacco, such as cigarettes, e-cigarettes, and chewing tobacco. These can delay healing. If you need help quitting, ask your health care provider.  Keep all follow-up visits as told by your health care provider. This is important. How is this prevented?  Warm up and stretch before being  active.  Cool down and stretch after being active.  Give your body time to rest between periods of activity.  Make sure any equipment that you use is fitted to you.  Be safe and responsible while being active to avoid falls.  Maintain physical fitness, including: ? Strength. ? Flexibility. ? Heart health (cardiovascular fitness). ? The ability to use muscles for a long time (endurance). Contact a health care provider if:  You have symptoms that get worse or do not get better after 2 weeks of treatment.  You develop new symptoms. Get help right away if:  You develop severe pain. Summary  Tendinitis is inflammation of the biceps tendon. Tenosynovitis is inflammation of the lining around the  biceps tendon. These conditions often occur at the same time.  These conditions are usually caused by overusing the shoulder joint and biceps muscle.  Symptoms include pain, warmth in the shoulder, and limited range of motion.  The two conditions are treated with rest, ice, medicines, and surgery (rare). This information is not intended to replace advice given to you by your health care provider. Make sure you discuss any questions you have with your health care provider. Document Revised: 05/04/2018 Document Reviewed: 03/04/2018 Elsevier Patient Education  [employer].

## 2020-05-16 DIAGNOSIS — Z940 Kidney transplant status: Secondary | ICD-10-CM | POA: Diagnosis not present

## 2020-05-16 DIAGNOSIS — I129 Hypertensive chronic kidney disease with stage 1 through stage 4 chronic kidney disease, or unspecified chronic kidney disease: Secondary | ICD-10-CM | POA: Diagnosis not present

## 2020-05-16 DIAGNOSIS — N189 Chronic kidney disease, unspecified: Secondary | ICD-10-CM | POA: Diagnosis not present

## 2020-05-16 DIAGNOSIS — D631 Anemia in chronic kidney disease: Secondary | ICD-10-CM | POA: Diagnosis not present

## 2020-05-16 NOTE — Progress Notes (Signed)
Internal Medicine Clinic Attending  Case discussed with Dr. Lee  At the time of the visit.  We reviewed the resident's history and exam and pertinent patient test results.  I agree with the assessment, diagnosis, and plan of care documented in the resident's note.    

## 2020-05-24 ENCOUNTER — Ambulatory Visit: Payer: BC Managed Care – PPO | Admitting: Physical Therapy

## 2020-05-30 ENCOUNTER — Encounter: Payer: Self-pay | Admitting: Physical Therapy

## 2020-05-30 ENCOUNTER — Other Ambulatory Visit: Payer: Self-pay

## 2020-05-30 ENCOUNTER — Ambulatory Visit: Payer: BC Managed Care – PPO | Attending: Internal Medicine | Admitting: Physical Therapy

## 2020-05-30 DIAGNOSIS — M546 Pain in thoracic spine: Secondary | ICD-10-CM | POA: Diagnosis present

## 2020-05-30 DIAGNOSIS — M542 Cervicalgia: Secondary | ICD-10-CM | POA: Diagnosis present

## 2020-05-30 DIAGNOSIS — M545 Low back pain, unspecified: Secondary | ICD-10-CM | POA: Insufficient documentation

## 2020-05-30 DIAGNOSIS — M5412 Radiculopathy, cervical region: Secondary | ICD-10-CM | POA: Diagnosis present

## 2020-05-30 DIAGNOSIS — M5431 Sciatica, right side: Secondary | ICD-10-CM | POA: Insufficient documentation
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
## 2020-05-31 ENCOUNTER — Ambulatory Visit: Payer: BC Managed Care – PPO | Admitting: Physical Therapy

## 2020-05-31 ENCOUNTER — Encounter: Payer: Self-pay | Admitting: Physical Therapy

## 2020-05-31 ENCOUNTER — Other Ambulatory Visit: Payer: Self-pay | Admitting: Internal Medicine

## 2020-05-31 ENCOUNTER — Ambulatory Visit (HOSPITAL_COMMUNITY)
Admission: RE | Admit: 2020-05-31 | Discharge: 2020-05-31 | Disposition: A | Discharge status: Home / Self Care | Payer: BC Managed Care – PPO | Source: Ambulatory Visit | Attending: Internal Medicine | Admitting: Internal Medicine

## 2020-05-31 DIAGNOSIS — M546 Pain in thoracic spine: Secondary | ICD-10-CM

## 2020-05-31 DIAGNOSIS — M5412 Radiculopathy, cervical region: Secondary | ICD-10-CM

## 2020-05-31 DIAGNOSIS — M545 Low back pain, unspecified: Secondary | ICD-10-CM | POA: Diagnosis not present

## 2020-05-31 DIAGNOSIS — M5431 Sciatica, right side: Secondary | ICD-10-CM

## 2020-05-31 DIAGNOSIS — M79604 Pain in right leg: Secondary | ICD-10-CM

## 2020-05-31 DIAGNOSIS — M542 Cervicalgia: Secondary | ICD-10-CM
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
## 2020-06-01 NOTE — Therapy (Deleted)
[place], [place], [address] Phone: [phone]   Fax:  [phone]  Jun 01, 2020   No Recipients  Physical Therapy Discharge Summary  Patient: Kristen Moreno  MRN: [id number]  Date of Birth: 1960-06-14   Diagnosis: Acute low back pain, unspecified back pain laterality, unspecified whether sciatica present  Sciatica, right side  Radiculopathy, cervical region  Cervicalgia  Pain in thoracic spine Referring Provider (PT): Sid Falcon, MD   The above patient had been seen in Physical Therapy 10 times of 13 treatments scheduled with 0 no shows and 3 cancellations.  The treatment consisted of therex, self care The patient is: Improved  Subjective: Pt reports that her low back is feeling about the same. she feels like PT has been minimally helpful. she feels that we should hold off on PT until she returns to her MD  Discharge Findings: see clinical impression statement  Functional Status at Discharge: Goals Partially Met   Plan - 05/31/20 1902    Clinical Impression Statement Pt has made some significant progress toward her low back pain goals.  She reports she is able to sit for longer without back support and walk longer distances.  She has significantly improved her hip strength.  Unfortunatly she is having some significant R shoulder issues at this point and this is more limiting/disruptive than her low back pain.  Pt requests D/C at this time until she can sort out R shoulder pain (waiting on MRI); I agree with plan.  Pt will be d/c home with HEP.    Personal Factors and Comorbidities Comorbidity 3+    Comorbidities OSA, GERD, L TKA, ESRD, TB, L ACL tear, immunosuppression, HTN    Examination-Activity Limitations Bathing;Bed Mobility;Bend;Caring for Others;Carry;Lift;Reach Overhead;Sit;Squat;Stand;Sleep;Transfers    Examination-Participation Restrictions Occupation;Driving;Yard Work;Shop;Meal  Prep    PT Frequency 2x / week    PT Duration 4 weeks    PT Treatment/Interventions ADLs/Self Care Home Management;Aquatic Therapy;Cryotherapy;Moist Heat;Iontophoresis 1m/ml Dexamethasone;Balance training;Therapeutic exercise;Therapeutic activities;Functional mobility training;Stair training;Patient/family education;Manual techniques;Spinal Manipulations;Joint Manipulations;Taping;Passive range of motion;Dry needling    PT Next Visit Plan --    PT Home Exercise Plan Access Code: [email or web address]   Consulted and Agree with Plan of Care Patient           Sincerely,   KMathis Dad PT   CC No Recipients  [facility] [address]Phone: [phone]  Fax:  [phone] Patient: Kristen Moreno MRN: [id number] Date of Birth: [birth year]

## 2020-06-05 ENCOUNTER — Encounter: Payer: BC Managed Care – PPO | Admitting: Physical Therapy

## 2020-06-07 ENCOUNTER — Ambulatory Visit: Payer: BC Managed Care – PPO

## 2020-06-12 ENCOUNTER — Encounter: Payer: BC Managed Care – PPO | Admitting: Physical Therapy

## 2020-07-16 ENCOUNTER — Other Ambulatory Visit: Payer: Self-pay

## 2020-07-16 ENCOUNTER — Encounter (HOSPITAL_BASED_OUTPATIENT_CLINIC_OR_DEPARTMENT_OTHER): Payer: Self-pay | Admitting: Orthopedic Surgery

## 2020-07-16 NOTE — Progress Notes (Addendum)
Spoke w/ via phone for pre-op interview--- Pt Lab needs dos---- Istat              Lab results------ current ekg in epic/ chart COVID test -----patient states asymptomatic no test needed  Arrive at -------  0630 on 07-17-2020 NPO after MN NO Solid Food.  Clear liquids from MN until--- 0530 Med rec completed Medications to take morning of surgery ----- Myfortic, Prograf, Prilosec, Prednisone, Norvasc, and if needed  Diabetic medication -----  do not take metformin morning of surgery  Patient instructed no nail polish to be worn day of surgery Patient instructed to bring photo id and insurance card day of surgery Patient aware to have Driver (ride ) / caregiver   for 24 hours after surgery -- daughter, Emalea Mix Patient Special Instructions ----- asked to bring rescue inhlaer Pre-Op special Istructions ----- n/a  Patient verbalized understanding of instructions that were given at this phone interview. Patient denies shortness of breath, chest pain, fever, cough at this phone interview.   Anesthesia Review:  HTN:  COPD;  PCKD s/p DDRT 03-15-2019;  DM2;  OSA no cpap;  pt denies chest pain, sob, difficulty breathing, and no peripheral swelling.  Last used inhaler 2 months ago.  PCP:  Dr L. Charleen Kirks ([facility] 03-06-2020 epic) Transplant clinic:  [facility] 03-20-2020 [facility] Nephrologist:  Dr Graylon Gunning [facility] 05-16-2020 copy of note w/ chart)  Chest x-ray :  02-22-2020 epic EKG : 12-30-2019 epic Echo : 12-26-2019 Stress test:  01-11-2020 nuclear epic Cardiac Cath :  no Activity level: SOB w/ exertion w/ long distance and stairs Sleep Study/ CPAP :  YES/ NO Fasting Blood Sugar :  128--130    / Checks Blood Sugar -- times a day:  every other day  Blood Thinner/ Instructions /Last Dose: no ASA / Instructions/ Last Dose :  ASA 81mg ;  per pt given instructions from Dr Percell Miller office to stop prior to surgery, last dose 06-25-2020, she stopped eariler that instrutcions given
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
## 2020-07-16 NOTE — Anesthesia Preprocedure Evaluation (Addendum)
Anesthesia Evaluation  Patient identified by MRN, date of birth, ID band Patient awake    Reviewed: Allergy & Precautions, NPO status , Patient's Chart, lab work & pertinent test results  History of Anesthesia Complications Negative for: history of anesthetic complications  Airway Mallampati: II  TM Distance: >3 FB Neck ROM: Full    Dental  (+) Missing, Partial Lower, Loose   Pulmonary sleep apnea (does not tolerated CPAP) , former smoker,  10/15/2019 SARS coronavirus NEG   Pulmonary exam normal        Cardiovascular hypertension, Pt. on medications (-) anginaNormal cardiovascular exam  2021 NST: Study Highlights   ? Nuclear stress EF: 72%. ? The left ventricular ejection fraction is hyperdynamic (>65%). ? There was no ST segment deviation noted during stress. ? The study is normal. ? This is a low risk study.    '20 Stress ECHO: Normal left ventricular function and global wall motion with stress, EF >70% with stress. Negative exercise echocardiography for inducible ischemia at target heart rate.    Neuro/Psych negative neurological ROS     GI/Hepatic Neg liver ROS, GERD  Medicated and Controlled,  Endo/Other  negative endocrine ROSdiabetes  Renal/GU Renal disease (now s/p renal transplant: functioning kidney: creat 1.3)S/P Transplant 2021 Polycystic kidney disease     Musculoskeletal  (+) Arthritis ,   Abdominal (+) + obese,   Peds  Hematology negative hematology ROS (+)   Anesthesia Other Findings   Reproductive/Obstetrics                            Anesthesia Physical  Anesthesia Plan  ASA: 3  Anesthesia Plan: General   Post-op Pain Management: GA combined w/ Regional for post-op pain   Induction: Intravenous  PONV Risk Score and Plan: 3 and Ondansetron, Dexamethasone, Treatment may vary due to age or medical condition and Midazolam  Airway Management Planned: Oral  ETT  Additional Equipment: None  Intra-op Plan:   Post-operative Plan:   Informed Consent: I have reviewed the patients History and Physical, chart, labs and discussed the procedure including the risks, benefits and alternatives for the proposed anesthesia with the patient or authorized representative who has indicated his/her understanding and acceptance.     Dental advisory given  Plan Discussed with: Anesthesiologist and CRNA  Anesthesia Plan Comments:        Anesthesia Quick Evaluation

## 2020-07-17 ENCOUNTER — Encounter (HOSPITAL_BASED_OUTPATIENT_CLINIC_OR_DEPARTMENT_OTHER): Payer: Self-pay | Admitting: Orthopedic Surgery

## 2020-07-17 ENCOUNTER — Encounter (HOSPITAL_BASED_OUTPATIENT_CLINIC_OR_DEPARTMENT_OTHER)
Admission: RE | Disposition: A | Discharge status: Home / Self Care | Payer: Self-pay | Source: Home / Self Care | Attending: Orthopedic Surgery

## 2020-07-17 ENCOUNTER — Ambulatory Visit (HOSPITAL_BASED_OUTPATIENT_CLINIC_OR_DEPARTMENT_OTHER): Payer: BC Managed Care – PPO | Admitting: Anesthesiology

## 2020-07-17 ENCOUNTER — Ambulatory Visit (HOSPITAL_BASED_OUTPATIENT_CLINIC_OR_DEPARTMENT_OTHER)
Admission: RE | Admit: 2020-07-17 | Discharge: 2020-07-17 | Disposition: A | Discharge status: Home / Self Care | Payer: BC Managed Care – PPO | Attending: Orthopedic Surgery | Admitting: Orthopedic Surgery

## 2020-07-17 DIAGNOSIS — Z803 Family history of malignant neoplasm of breast: Secondary | ICD-10-CM | POA: Insufficient documentation

## 2020-07-17 DIAGNOSIS — Z8249 Family history of ischemic heart disease and other diseases of the circulatory system: Secondary | ICD-10-CM | POA: Diagnosis not present

## 2020-07-17 DIAGNOSIS — Z7984 Long term (current) use of oral hypoglycemic drugs: Secondary | ICD-10-CM | POA: Insufficient documentation

## 2020-07-17 DIAGNOSIS — Z87891 Personal history of nicotine dependence: Secondary | ICD-10-CM | POA: Diagnosis not present

## 2020-07-17 DIAGNOSIS — Z992 Dependence on renal dialysis: Secondary | ICD-10-CM | POA: Diagnosis not present

## 2020-07-17 DIAGNOSIS — Z888 Allergy status to other drugs, medicaments and biological substances status: Secondary | ICD-10-CM | POA: Diagnosis not present

## 2020-07-17 DIAGNOSIS — Z825 Family history of asthma and other chronic lower respiratory diseases: Secondary | ICD-10-CM | POA: Insufficient documentation

## 2020-07-17 DIAGNOSIS — N186 End stage renal disease: Secondary | ICD-10-CM | POA: Insufficient documentation

## 2020-07-17 DIAGNOSIS — Z791 Long term (current) use of non-steroidal anti-inflammatories (NSAID): Secondary | ICD-10-CM | POA: Diagnosis not present

## 2020-07-17 DIAGNOSIS — Q612 Polycystic kidney, adult type: Secondary | ICD-10-CM | POA: Diagnosis not present

## 2020-07-17 DIAGNOSIS — Z7952 Long term (current) use of systemic steroids: Secondary | ICD-10-CM | POA: Diagnosis not present

## 2020-07-17 DIAGNOSIS — I120 Hypertensive chronic kidney disease with stage 5 chronic kidney disease or end stage renal disease: Secondary | ICD-10-CM | POA: Insufficient documentation

## 2020-07-17 DIAGNOSIS — Z841 Family history of disorders of kidney and ureter: Secondary | ICD-10-CM | POA: Diagnosis not present

## 2020-07-17 DIAGNOSIS — M75101 Unspecified rotator cuff tear or rupture of right shoulder, not specified as traumatic: Secondary | ICD-10-CM | POA: Insufficient documentation

## 2020-07-17 DIAGNOSIS — Z940 Kidney transplant status: Secondary | ICD-10-CM | POA: Diagnosis not present

## 2020-07-17 DIAGNOSIS — E1122 Type 2 diabetes mellitus with diabetic chronic kidney disease: Secondary | ICD-10-CM | POA: Insufficient documentation

## 2020-07-17 DIAGNOSIS — K2270 Barrett's esophagus without dysplasia: Secondary | ICD-10-CM

## 2020-07-17 HISTORY — DX: Other specified postprocedural states: Z98.890

## 2020-07-17 HISTORY — DX: Presence of dental prosthetic device (complete) (partial): Z97.2

## 2020-07-17 HISTORY — DX: Type 2 diabetes mellitus without complications: E11.9

## 2020-07-17 HISTORY — DX: Dyspnea, unspecified: R06.00

## 2020-07-17 HISTORY — DX: Unspecified osteoarthritis, unspecified site: M19.90

## 2020-07-17 HISTORY — DX: Chronic kidney disease, unspecified: N18.9

## 2020-07-17 HISTORY — DX: Anemia in chronic kidney disease: D63.1

## 2020-07-17 HISTORY — DX: Secondary hyperparathyroidism of renal origin: N25.81

## 2020-07-17 HISTORY — PX: SHOULDER ARTHROSCOPY WITH ROTATOR CUFF REPAIR AND OPEN BICEPS TENODESIS: SHX6677

## 2020-07-17 HISTORY — DX: Diaphragmatic hernia without obstruction or gangrene: K44.9

## 2020-07-17 HISTORY — DX: Presence of spectacles and contact lenses: Z97.3

## 2020-07-17 LAB — POCT I-STAT, CHEM 8
BUN: 27 mg/dL — ABNORMAL HIGH (ref 6–20)
Calcium, Ion: 1.3 mmol/L (ref 1.15–1.40)
Chloride: 107 mmol/L (ref 98–111)
Creatinine, Ser: 1.4 mg/dL — ABNORMAL HIGH (ref 0.44–1.00)
Glucose, Bld: 112 mg/dL — ABNORMAL HIGH (ref 70–99)
HCT: 40 % (ref 36.0–46.0)
Hemoglobin: 13.6 g/dL (ref 12.0–15.0)
Potassium: 4.2 mmol/L (ref 3.5–5.1)
Sodium: 141 mmol/L (ref 135–145)
TCO2: 24 mmol/L (ref 22–32)

## 2020-07-17 LAB — GLUCOSE, CAPILLARY: Glucose-Capillary: 121 mg/dL — ABNORMAL HIGH (ref 70–99)

## 2020-07-17 SURGERY — SHOULDER ARTHROSCOPY WITH ROTATOR CUFF REPAIR AND OPEN BICEPS TENODESIS
Anesthesia: General | Site: Shoulder | Laterality: Right

## 2020-07-17 MED ORDER — ALBUMIN HUMAN 5 % IV SOLN
INTRAVENOUS | Status: AC
  Filled 2020-07-17: qty 250

## 2020-07-17 MED ORDER — BUPIVACAINE LIPOSOME 1.3 % IJ SUSP
INTRAMUSCULAR | Status: DC | PRN
  Administered 2020-07-17: 10 mL

## 2020-07-17 MED ORDER — FENTANYL CITRATE (PF) 100 MCG/2ML IJ SOLN
100.0000 ug | Freq: Once | INTRAMUSCULAR | Status: AC
  Administered 2020-07-17: 100 ug via INTRAVENOUS

## 2020-07-17 MED ORDER — SODIUM CHLORIDE 0.9 % IR SOLN
Status: DC | PRN
  Administered 2020-07-17 (×2): 3000 mL
  Administered 2020-07-17: 6000 mL

## 2020-07-17 MED ORDER — PHENYLEPHRINE HCL (PRESSORS) 10 MG/ML IV SOLN
INTRAVENOUS | Status: AC
  Filled 2020-07-17: qty 1

## 2020-07-17 MED ORDER — MIDAZOLAM HCL 2 MG/2ML IJ SOLN
INTRAMUSCULAR | Status: AC
  Filled 2020-07-17: qty 2

## 2020-07-17 MED ORDER — ATROPINE SULFATE 1 MG/10ML IJ SOSY
PREFILLED_SYRINGE | INTRAMUSCULAR | Status: AC
  Filled 2020-07-17: qty 10

## 2020-07-17 MED ORDER — EPHEDRINE 5 MG/ML INJ
INTRAVENOUS | Status: AC
  Filled 2020-07-17: qty 10

## 2020-07-17 MED ORDER — ALBUMIN HUMAN 5 % IV SOLN: INTRAVENOUS | Status: DC | PRN

## 2020-07-17 MED ORDER — SUGAMMADEX SODIUM 200 MG/2ML IV SOLN
INTRAVENOUS | Status: DC | PRN
  Administered 2020-07-17: 350 mg via INTRAVENOUS

## 2020-07-17 MED ORDER — SUGAMMADEX SODIUM 200 MG/2ML IV SOLN: INTRAVENOUS | Status: DC | PRN

## 2020-07-17 MED ORDER — AMISULPRIDE (ANTIEMETIC) 5 MG/2ML IV SOLN: 10.0000 mg | Freq: Once | INTRAVENOUS | Status: DC | PRN

## 2020-07-17 MED ORDER — FENTANYL CITRATE (PF) 100 MCG/2ML IJ SOLN
INTRAMUSCULAR | Status: AC
  Filled 2020-07-17: qty 2

## 2020-07-17 MED ORDER — ACETAMINOPHEN 500 MG PO TABS
ORAL_TABLET | ORAL | Status: AC
  Filled 2020-07-17: qty 2

## 2020-07-17 MED ORDER — FENTANYL CITRATE (PF) 100 MCG/2ML IJ SOLN: 25.0000 ug | INTRAMUSCULAR | Status: DC | PRN

## 2020-07-17 MED ORDER — BUPIVACAINE HCL (PF) 0.5 % IJ SOLN
INTRAMUSCULAR | Status: DC | PRN
  Administered 2020-07-17: 15 mL

## 2020-07-17 MED ORDER — SUCCINYLCHOLINE CHLORIDE 200 MG/10ML IV SOSY
PREFILLED_SYRINGE | INTRAVENOUS | Status: AC
  Filled 2020-07-17: qty 10

## 2020-07-17 MED ORDER — PROMETHAZINE HCL 25 MG/ML IJ SOLN: 6.2500 mg | INTRAMUSCULAR | Status: DC | PRN

## 2020-07-17 MED ORDER — CEFAZOLIN SODIUM-DEXTROSE 2-4 GM/100ML-% IV SOLN
INTRAVENOUS | Status: AC
  Filled 2020-07-17: qty 100

## 2020-07-17 MED ORDER — LIDOCAINE HCL (PF) 2 % IJ SOLN
INTRAMUSCULAR | Status: AC
  Filled 2020-07-17: qty 5

## 2020-07-17 MED ORDER — PROPOFOL 10 MG/ML IV BOLUS
INTRAVENOUS | Status: DC | PRN
  Administered 2020-07-17: 200 mg via INTRAVENOUS

## 2020-07-17 MED ORDER — ONDANSETRON HCL 4 MG/2ML IJ SOLN
INTRAMUSCULAR | Status: AC
  Filled 2020-07-17: qty 2

## 2020-07-17 MED ORDER — MIDAZOLAM HCL 2 MG/2ML IJ SOLN
2.0000 mg | Freq: Once | INTRAMUSCULAR | Status: AC
  Administered 2020-07-17: 2 mg via INTRAVENOUS

## 2020-07-17 MED ORDER — DEXAMETHASONE SODIUM PHOSPHATE 10 MG/ML IJ SOLN: 8.0000 mg | Freq: Once | INTRAMUSCULAR | Status: DC

## 2020-07-17 MED ORDER — CEFAZOLIN SODIUM-DEXTROSE 2-4 GM/100ML-% IV SOLN
2.0000 g | INTRAVENOUS | Status: AC
  Administered 2020-07-17: 2 g via INTRAVENOUS

## 2020-07-17 MED ORDER — ROCURONIUM BROMIDE 100 MG/10ML IV SOLN
INTRAVENOUS | Status: DC | PRN
  Administered 2020-07-17: 100 mg via INTRAVENOUS

## 2020-07-17 MED ORDER — CELECOXIB 200 MG PO CAPS
200.0000 mg | ORAL_CAPSULE | Freq: Once | ORAL | Status: AC
  Administered 2020-07-17: 200 mg via ORAL

## 2020-07-17 MED ORDER — SODIUM CHLORIDE 0.9 % IV SOLN: INTRAVENOUS | Status: DC

## 2020-07-17 MED ORDER — POVIDONE-IODINE 10 % EX SWAB: 2.0000 "application " | Freq: Once | CUTANEOUS | Status: DC

## 2020-07-17 MED ORDER — ONDANSETRON HCL 4 MG/2ML IJ SOLN
INTRAMUSCULAR | Status: DC | PRN
  Administered 2020-07-17: 4 mg via INTRAVENOUS

## 2020-07-17 MED ORDER — CELECOXIB 200 MG PO CAPS
ORAL_CAPSULE | ORAL | Status: AC
  Filled 2020-07-17: qty 1

## 2020-07-17 MED ORDER — HYDROCODONE-ACETAMINOPHEN 10-325 MG PO TABS: 1.0000 | ORAL_TABLET | Freq: Four times a day (QID) | ORAL | 0 refills | Status: DC | PRN

## 2020-07-17 MED ORDER — PHENYLEPHRINE HCL (PRESSORS) 10 MG/ML IV SOLN
INTRAVENOUS | Status: DC | PRN
  Administered 2020-07-17: 40 ug via INTRAVENOUS
  Administered 2020-07-17: 80 ug via INTRAVENOUS

## 2020-07-17 MED ORDER — PHENYLEPHRINE 40 MCG/ML (10ML) SYRINGE FOR IV PUSH (FOR BLOOD PRESSURE SUPPORT)
PREFILLED_SYRINGE | INTRAVENOUS | Status: AC
  Filled 2020-07-17: qty 10

## 2020-07-17 MED ORDER — ACETAMINOPHEN 500 MG PO TABS
1000.0000 mg | ORAL_TABLET | Freq: Once | ORAL | Status: AC
  Administered 2020-07-17: 1000 mg via ORAL

## 2020-07-17 SURGICAL SUPPLY — 80 items
AID PSTN UNV HD RSTRNT DISP (MISCELLANEOUS) ×1
ANCH SUT 2.9 PUSHLOCK ANCH (Orthopedic Implant) ×1 IMPLANT
APL PRP STRL LF DISP 70% ISPRP (MISCELLANEOUS) ×1
BLADE SURG 15 STRL LF DISP TIS (BLADE) IMPLANT
BLADE SURG 15 STRL SS (BLADE)
BURR CLEARCUT OVAL 5.5X13 (MISCELLANEOUS) IMPLANT
BURR OVAL 12 FL 5.5X13 (MISCELLANEOUS) ×1
BURR OVAL 8 FLU 4.0X13 (MISCELLANEOUS) ×1 IMPLANT
CANNULA 5.75X71 LONG (CANNULA) IMPLANT
CANNULA TWIST IN 8.25X7CM (CANNULA) ×1 IMPLANT
CHLORAPREP W/TINT 26 (MISCELLANEOUS) ×2 IMPLANT
CLSR STERI-STRIP ANTIMIC 1/2X4 (GAUZE/BANDAGES/DRESSINGS) ×2 IMPLANT
COVER WAND RF STERILE (DRAPES) ×1 IMPLANT
DISSECTOR 4.0MM X 13CM (MISCELLANEOUS) ×1 IMPLANT
DRAPE IMP U-DRAPE 54X76 (DRAPES) ×2 IMPLANT
DRAPE INCISE IOBAN 66X45 STRL (DRAPES) ×2 IMPLANT
DRAPE ORTHO SPLIT 77X108 STRL (DRAPES) ×4
DRAPE SHOULDER BEACH CHAIR (DRAPES) ×2 IMPLANT
DRAPE STERI 35X30 U-POUCH (DRAPES) ×2 IMPLANT
DRAPE SURG ORHT 6 SPLT 77X108 (DRAPES) ×2 IMPLANT
DRAPE U-SHAPE 47X51 STRL (DRAPES) ×2 IMPLANT
DRSG EMULSION OIL 3X3 NADH (GAUZE/BANDAGES/DRESSINGS) ×1 IMPLANT
DRSG PAD ABDOMINAL 8X10 ST (GAUZE/BANDAGES/DRESSINGS) ×2 IMPLANT
ELECT REM PT RETURN 9FT ADLT (ELECTROSURGICAL) ×2
ELECTRODE REM PT RTRN 9FT ADLT (ELECTROSURGICAL) IMPLANT
GAUZE SPONGE 4X4 12PLY STRL (GAUZE/BANDAGES/DRESSINGS) ×4 IMPLANT
GAUZE SPONGE 4X4 8PLY NS (GAUZE/BANDAGES/DRESSINGS) ×1 IMPLANT
GLOVE SRG 8 PF TXTR STRL LF DI (GLOVE) ×2 IMPLANT
GLOVE SURG ENC MOIS LTX SZ6.5 (GLOVE) ×2 IMPLANT
GLOVE SURG ENC MOIS LTX SZ7.5 (GLOVE) ×4 IMPLANT
GLOVE SURG UNDER POLY LF SZ6.5 (GLOVE) ×2 IMPLANT
GLOVE SURG UNDER POLY LF SZ8 (GLOVE) ×4
GOWN STRL REUS W/TWL LRG LVL3 (GOWN DISPOSABLE) ×4 IMPLANT
IMPL SPEEDBRIDGE KIT (Orthopedic Implant) IMPLANT
IMPLANT SPEEDBRIDGE KIT (Orthopedic Implant) ×2 IMPLANT
IV NS IRRIG 3000ML ARTHROMATIC (IV SOLUTION) ×9 IMPLANT
KIT PUSHLOCK 2.9 HIP (KITS) IMPLANT
KIT TURNOVER CYSTO (KITS) ×2 IMPLANT
MANIFOLD NEPTUNE II (INSTRUMENTS) ×2 IMPLANT
NDL SCORPION MULTI FIRE (NEEDLE) IMPLANT
NDL SUT 6 .5 CRC .975X.05 MAYO (NEEDLE) IMPLANT
NEEDLE MAYO TAPER (NEEDLE)
NEEDLE SCORPION MULTI FIRE (NEEDLE) IMPLANT
NS IRRIG 1000ML POUR BTL (IV SOLUTION) IMPLANT
PACK ARTHROSCOPY DSU (CUSTOM PROCEDURE TRAY) ×2 IMPLANT
PACK BASIN DAY SURGERY FS (CUSTOM PROCEDURE TRAY) ×2 IMPLANT
PASSER SUT SWIFTSTITCH HIP CRT (INSTRUMENTS) ×1 IMPLANT
PENCIL SMOKE EVACUATOR (MISCELLANEOUS) IMPLANT
PORT APPOLLO RF 90DEGREE MULTI (SURGICAL WAND) ×1 IMPLANT
RESTRAINT HEAD UNIVERSAL NS (MISCELLANEOUS) ×2 IMPLANT
SLEEVE SCD COMPRESS KNEE MED (STOCKING) ×1 IMPLANT
SLING ARM FOAM STRAP LRG (SOFTGOODS) ×1 IMPLANT
SLING ARM FOAM STRAP MED (SOFTGOODS) IMPLANT
SLING ARM FOAM STRAP XLG (SOFTGOODS) IMPLANT
SLING ARM IMMOBILIZER LRG (SOFTGOODS) IMPLANT
SLING ARM IMMOBILIZER MED (SOFTGOODS) IMPLANT
STRIP CLOSURE SKIN 1/2X4 (GAUZE/BANDAGES/DRESSINGS) IMPLANT
SUCTION FRAZIER HANDLE 10FR (MISCELLANEOUS)
SUCTION TUBE FRAZIER 10FR DISP (MISCELLANEOUS) IMPLANT
SUT ETHIBOND 2 OS 4 DA (SUTURE) IMPLANT
SUT ETHILON 2 0 FS 18 (SUTURE) IMPLANT
SUT ETHILON 3 0 PS 1 (SUTURE) ×2 IMPLANT
SUT FIBERWIRE #2 38 T-5 BLUE (SUTURE)
SUT MNCRL AB 4-0 PS2 18 (SUTURE) IMPLANT
SUT TIGER TAPE 7 IN WHITE (SUTURE) IMPLANT
SUT VIC AB 0 CT1 27 (SUTURE)
SUT VIC AB 0 CT1 27XBRD ANBCTR (SUTURE) IMPLANT
SUT VIC AB 2-0 SH 27 (SUTURE)
SUT VIC AB 2-0 SH 27XBRD (SUTURE) IMPLANT
SUT VIC AB 3-0 FS2 27 (SUTURE) IMPLANT
SUTURE FIBERWR #2 38 T-5 BLUE (SUTURE) IMPLANT
SUTURE TAPE TIGERLINK 1.3MM BL (SUTURE) IMPLANT
SUTURETAPE TIGERLINK 1.3MM BL (SUTURE)
SYSTEM IMPL TENODESIS LNT 2.9 (Orthopedic Implant) ×1 IMPLANT
TAPE FIBER 2MM 7IN #2 BLUE (SUTURE) IMPLANT
TAPE HYPAFIX 6X30 (GAUZE/BANDAGES/DRESSINGS) ×1 IMPLANT
TOWEL OR 17X26 10 PK STRL BLUE (TOWEL DISPOSABLE) ×2 IMPLANT
TUBING ARTHROSCOPY IRRIG 16FT (MISCELLANEOUS) ×2 IMPLANT
WATER STERILE IRR 1000ML POUR (IV SOLUTION) IMPLANT
YANKAUER SUCT BULB TIP NO VENT (SUCTIONS) IMPLANT

## 2020-07-17 NOTE — Anesthesia Procedure Notes (Signed)
Procedure Name: Intubation Date/Time: 07/17/2020 9:17 AM Performed by: Willa Frater, CRNA Pre-anesthesia Checklist: Patient identified, Emergency Drugs available, Suction available and Patient being monitored Patient Re-evaluated:Patient Re-evaluated prior to induction Oxygen Delivery Method: Circle system utilized Preoxygenation: Pre-oxygenation with 100% oxygen Induction Type: IV induction Ventilation: Mask ventilation without difficulty Laryngoscope Size: Mac and 3 Grade View: Grade I Tube type: Oral Number of attempts: 1 Airway Equipment and Method: Stylet and Oral airway Placement Confirmation: ETT inserted through vocal cords under direct vision, positive ETCO2 and breath sounds checked- equal and bilateral Secured at: 22 cm Tube secured with: Tape Dental Injury: Teeth and Oropharynx as per pre-operative assessment

## 2020-07-17 NOTE — Interval H&P Note (Signed)
History and Physical Interval Note:  07/17/2020 6:54 AM  Kristen Moreno  has presented today for surgery, with the diagnosis of [place].  The various methods of treatment have been discussed with the patient and family. After consideration of risks, benefits and other options for treatment, the patient has consented to  Procedure(s): SHOULDER ARTHROSCOPY WITH ROTATOR CUFF REPAIR AND  BICEPS TENODESIS, SUBACROMIAL DECOMPRESSION, DISTAL CLAVICLE EXCISION (Right) as a surgical intervention.  The patient's history has been reviewed, patient examined, no change in status, stable for surgery.  I have reviewed the patient's chart and labs.  Questions were answered to the patient's satisfaction.     Renette Butters

## 2020-07-17 NOTE — Transfer of Care (Signed)
Immediate Anesthesia Transfer of Care Note  Patient: Kristen Moreno  Procedure(s) Performed: SHOULDER ARTHROSCOPY WITH ROTATOR CUFF REPAIR AND  BICEPS TENODESIS, SUBACROMIAL DECOMPRESSION, DISTAL CLAVICLE EXCISION (Right: Shoulder)  Patient Location: PACU  Anesthesia Type:GA combined with regional for post-op pain  Level of Consciousness: awake, alert , oriented, drowsy and patient cooperative  Airway & Oxygen Therapy: Patient Spontanous Breathing and Patient connected to face mask oxygen  Post-op Assessment: Report given to RN and Post -op Vital signs reviewed and stable  Post vital signs: Reviewed and stable  Last Vitals:  Vitals Value Taken Time  BP    Temp    Pulse 74 07/17/20 1059  Resp 20 07/17/20 1100  SpO2 98 % 07/17/20 1059  Vitals shown include unvalidated device data.  Last Pain:  Vitals:   07/17/20 0644  TempSrc: Oral  PainSc: 4       Patients Stated Pain Goal: 4 (38/25/05 3976)  Complications: No notable events documented.

## 2020-07-17 NOTE — Discharge Instructions (Addendum)
POST-OPERATIVE OPIOID TAPER INSTRUCTIONS: It is important to wean off of your opioid medication as soon as possible. If you do not need pain medication after your surgery it is ok to stop day one. Opioids include: Codeine, Hydrocodone(Norco, Vicodin), Oxycodone(Percocet, oxycontin) and hydromorphone amongst others.  Long term and even short term use of opiods can cause: Increased pain response Dependence Constipation Depression Respiratory depression And more.  Withdrawal symptoms can include Flu like symptoms Nausea, vomiting And more Techniques to manage these symptoms Hydrate well Eat regular healthy meals Stay active Use relaxation techniques(deep breathing, meditating, yoga) Do Not substitute Alcohol to help with tapering If you have been on opioids for less than two weeks and do not have pain than it is ok to stop all together.  Plan to wean off of opioids This plan should start within one week post op of your joint replacement. Maintain the same interval or time between taking each dose and first decrease the dose.  Cut the total daily intake of opioids by one tablet each day Next start to increase the time between doses. The last dose that should be eliminated is the evening dose.    Post Anesthesia Home Care Instructions  Activity: Get plenty of rest for the remainder of the day. A responsible individual must stay with you for 24 hours following the procedure.  For the next 24 hours, DO NOT: -Drive a car -Operate machinery -Drink alcoholic beverages -Take any medication unless instructed by your physician -Make any legal decisions or sign important papers.  Meals: Start with liquid foods such as gelatin or soup. Progress to regular foods as tolerated. Avoid greasy, spicy, heavy foods. If nausea and/or vomiting occur, drink only clear liquids until the nausea and/or vomiting subsides. Call your physician if vomiting continues.  Special Instructions/Symptoms: Your  throat may feel dry or sore from the anesthesia or the breathing tube placed in your throat during surgery. If this causes discomfort, gargle with warm salt water. The discomfort should disappear within 24 hours.  If you had a scopolamine patch placed behind your ear for the management of post- operative nausea and/or vomiting:  1. The medication in the patch is effective for 72 hours, after which it should be removed.  Wrap patch in a tissue and discard in the trash. Wash hands thoroughly with soap and water. 2. You may remove the patch earlier than 72 hours if you experience unpleasant side effects which may include dry mouth, dizziness or visual disturbances. 3. Avoid touching the patch. Wash your hands with soap and water after contact with the patch.     Regional Anesthesia Blocks  1. Numbness or the inability to move the "blocked" extremity may last from 3-48 hours after placement. The length of time depends on the medication injected and your individual response to the medication. If the numbness is not going away after 48 hours, call your surgeon.  2. The extremity that is blocked will need to be protected until the numbness is gone and the  Strength has returned. Because you cannot feel it, you will need to take extra care to avoid injury. Because it may be weak, you may have difficulty moving it or using it. You may not know what position it is in without looking at it while the block is in effect.  3. For blocks in the legs and feet, returning to weight bearing and walking needs to be done carefully. You will need to wait until the numbness is entirely gone   strength has returned. You should be able to move your leg and foot normally before you try and bear weight or walk. You will need someone to be with you when you first try to ensure you do not fall and possibly risk injury.  4. Bruising and tenderness at the needle site are common side effects and will resolve in a few days.  5.  Persistent numbness or new problems with movement should be communicated to the surgeon or the [place] [phone] [place] [phone]).   Information for Discharge Teaching: EXPAREL (bupivacaine liposome injectable suspension)   Your surgeon or anesthesiologist gave you EXPAREL(bupivacaine) to help control your pain after surgery.  EXPAREL is a local anesthetic that provides pain relief by numbing the tissue around the surgical site. EXPAREL is designed to release pain medication over time and can control pain for up to 72 hours. Depending on how you respond to EXPAREL, you may require less pain medication during your recovery.  Possible side effects: Temporary loss of sensation or ability to move in the area where bupivacaine was injected. Nausea, vomiting, constipation Rarely, numbness and tingling in your mouth or lips, lightheadedness, or anxiety may occur. Call your doctor right away if you think you may be experiencing any of these sensations, or if you have other questions regarding possible side effects.  Follow all other discharge instructions given to you by your surgeon or nurse. Eat a healthy diet and drink plenty of water or other fluids.  If you return to the hospital for any reason within 96 hours following the administration of EXPAREL, it is important for health care providers to know that you have received this anesthetic. A teal colored band has been placed on your arm with the date, time and amount of EXPAREL you have received in order to alert and inform your health care providers. Please leave this armband in place for the full 96 hours following administration, and then you may remove the band.

## 2020-07-17 NOTE — Progress Notes (Signed)
Assisted Dr. Singer with right, ultrasound guided, supraclavicular block. Side rails up, monitors on throughout procedure. See vital signs in flow sheet. Tolerated Procedure well. 

## 2020-07-17 NOTE — Anesthesia Procedure Notes (Signed)
Anesthesia Regional Block: Interscalene brachial plexus block   Pre-Anesthetic Checklist: , timeout performed,  Correct Patient, Correct Site, Correct Laterality,  Correct Procedure, Correct Position, site marked,  Risks and benefits discussed,  Surgical consent,  Pre-op evaluation,  At surgeon's request and post-op pain management  Laterality: Right  Prep: chloraprep       Needles:  Injection technique: Single-shot  Needle Type: Echogenic Stimulator Needle     Needle Length: 5cm  Needle Gauge: 22     Additional Needles:   Procedures:,,,, ultrasound used (permanent image in chart),,    Narrative:  Start time: 07/17/2020 7:52 AM End time: 07/17/2020 8:02 AM Injection made incrementally with aspirations every 5 mL.  Performed by: Personally  Anesthesiologist: Duane Boston, MD  Additional Notes: Functioning IV was confirmed and monitors applied.  A 42mm 22ga echogenic arrow stimulator was used. Sterile prep and drape,hand hygiene and sterile gloves were used.Ultrasound guidance: relevant anatomy identified, needle position confirmed, local anesthetic spread visualized around nerve(s)., vascular puncture avoided.  Image printed for medical record.  Negative aspiration and negative test dose prior to incremental administration of local anesthetic. The patient tolerated the procedure well.

## 2020-07-17 NOTE — Op Note (Signed)
07/17/2020  10:45 AM  PATIENT:  Kristen Moreno    PRE-OPERATIVE DIAGNOSIS:  RIGHT SHOULDER ROTATOR CUFF TEAR  POST-OPERATIVE DIAGNOSIS:  Same  PROCEDURE:  SHOULDER ARTHROSCOPY WITH ROTATOR CUFF REPAIR AND  BICEPS TENODESIS, SUBACROMIAL DECOMPRESSION, DISTAL CLAVICLE EXCISION  SURGEON:  Renette Butters, MD  ASSISTANT: Aggie Moats, PA-C, he was present and scrubbed throughout the case, critical for completion in a timely fashion, and for retraction, instrumentation, and closure.   ANESTHESIA:   General  PREOPERATIVE INDICATIONS:  Kristen Moreno is a  60 y.o. female with a diagnosis of [place] who failed conservative measures and elected for surgical management.    The risks benefits and alternatives were discussed with the patient preoperatively including but not limited to the risks of infection, bleeding, nerve injury, cardiopulmonary complications, the need for revision surgery, among others, and the patient was willing to proceed.  OPERATIVE IMPLANTS: arthrex anchors  OPERATIVE FINDINGS: Subscap and supra tear. Biceps tear  BLOOD LOSS: minimal  COMPLICATIONS: none  OPERATIVE PROCEDURE:  Patient was identified in the preoperative holding area and site was marked by me He was transported to the operating theater and placed on the table in beach chair position taking care to pad all bony prominences. After a preincinduction time out anesthesia was induced. The right upper extremity was prepped and draped in normal sterile fashion and a pre-incision timeout was performed. Benson received ancef for preoperative antibiotics.   Initially made a posterior arthroscopic portal and inserted the arthroscope into the glenohumeral joint. tour of the joint demonstrated the above operative findings  I created an anterior portal just lateral to the coracoid under direct visualization using a spinal needle.  I performed an extensive debridement of  the scarred synovial tissue and remaining structures   I used a combination of biter and shaver to release the biceps tendon from the superior labrum and then used the shaver to debride the superior labrum to a smooth rim. I performed a Loop and tack biceps tenodesis  I placed a fiberlink around the torn edge of the subscap. I placed this into the 2.9 pushlock as well. This repaired the subscap tendon  I then introduced the arthroscope into the subacromial space and brought the shaver into the anterior portal. I debrided the bursa for appropriate visualization.  I then performed a subacromial decompression using combination of the shaver ArthroCare and burr using a cutting block technique. As happy with the final elevation of the subacromial space on multiple portal views.   Next I turned my attention to the distal clavicle and through the anterior portal using the bur and shaver I was able to perform a distal clavicle excision. I then switched portals and inserted the arthroscope into the anterior portal and was happy with an appropriate resection of the distal clavicle.  I then debrided the bed for supra healing and performed a 2 x 2 dual row repair of there supraspinatus tendon  I was happy with the tendon apposition and there was minimal to no dog ear.  Next I removed all arthroscopic equipment expressed all fluid and closed the portals with a nylon stitch. A sterile dressing was applied the patient was taken the PACU in stable condition.  POST OPERATIVE PLAN: The patient will be in a sling full-time and keep the dressings clean dry and intact. DVT prophylaxis will consist of early ambulation

## 2020-07-17 NOTE — Anesthesia Postprocedure Evaluation (Signed)
Anesthesia Post Note  Patient: Kristen Moreno  Procedure(s) Performed: SHOULDER ARTHROSCOPY WITH ROTATOR CUFF REPAIR AND  BICEPS TENODESIS, SUBACROMIAL DECOMPRESSION, DISTAL CLAVICLE EXCISION (Right: Shoulder)     Patient location during evaluation: PACU Anesthesia Type: General Level of consciousness: sedated Pain management: pain level controlled Vital Signs Assessment: post-procedure vital signs reviewed and stable Respiratory status: spontaneous breathing and respiratory function stable Cardiovascular status: stable Postop Assessment: no apparent nausea or vomiting Anesthetic complications: no   No notable events documented.  Last Vitals:  Vitals:   07/17/20 1215 07/17/20 1230  BP: (!) 181/89   Pulse: 74   Resp: 18 18  Temp:  (!) 36.2 C  SpO2: 97% 97%    Last Pain:  Vitals:   07/17/20 1230  TempSrc:   PainSc: 0-No pain                 Yana Schorr DANIEL

## 2020-07-18 ENCOUNTER — Encounter (HOSPITAL_BASED_OUTPATIENT_CLINIC_OR_DEPARTMENT_OTHER): Payer: Self-pay | Admitting: Orthopedic Surgery

## 2020-07-24 ENCOUNTER — Encounter: Payer: Self-pay | Admitting: *Deleted

## 2020-07-30 ENCOUNTER — Ambulatory Visit (HOSPITAL_BASED_OUTPATIENT_CLINIC_OR_DEPARTMENT_OTHER): Payer: BC Managed Care – PPO | Admitting: Internal Medicine

## 2020-07-30 ENCOUNTER — Other Ambulatory Visit: Payer: Self-pay

## 2020-07-30 ENCOUNTER — Encounter: Payer: Self-pay | Admitting: Internal Medicine

## 2020-07-30 VITALS — BP 141/82 | HR 79 | Temp 98.1°F | Ht 66.0 in | Wt 192.0 lb

## 2020-07-30 DIAGNOSIS — M7521 Bicipital tendinitis, right shoulder: Secondary | ICD-10-CM

## 2020-07-30 DIAGNOSIS — J439 Emphysema, unspecified: Secondary | ICD-10-CM

## 2020-07-30 DIAGNOSIS — J4520 Mild intermittent asthma, uncomplicated: Secondary | ICD-10-CM | POA: Diagnosis not present

## 2020-07-30 DIAGNOSIS — E119 Type 2 diabetes mellitus without complications: Secondary | ICD-10-CM

## 2020-07-30 DIAGNOSIS — I10 Essential (primary) hypertension: Secondary | ICD-10-CM | POA: Diagnosis not present

## 2020-07-30 DIAGNOSIS — G4700 Insomnia, unspecified: Secondary | ICD-10-CM | POA: Insufficient documentation

## 2020-07-30 DIAGNOSIS — F5101 Primary insomnia: Secondary | ICD-10-CM | POA: Diagnosis not present

## 2020-07-30 LAB — POCT GLYCOSYLATED HEMOGLOBIN (HGB A1C): Hemoglobin A1C: 6.8 % — AB (ref 4.0–5.6)

## 2020-07-30 LAB — GLUCOSE, CAPILLARY: Glucose-Capillary: 116 mg/dL — ABNORMAL HIGH (ref 70–99)

## 2020-07-30 MED ORDER — ZOLPIDEM TARTRATE 5 MG PO TABS: 5.0000 mg | ORAL_TABLET | Freq: Every evening | ORAL | 0 refills | Status: DC | PRN

## 2020-07-30 MED ORDER — SEMAGLUTIDE(0.25 OR 0.5MG/DOS) 2 MG/1.5ML ~~LOC~~ SOPN: 0.2500 mg | PEN_INJECTOR | SUBCUTANEOUS | 1 refills | Status: DC

## 2020-07-30 NOTE — Patient Instructions (Signed)
It was nice seeing you today! Thank you for choosing [facility] for your Primary Care.    Today we talked about:   Diabetes: Your A1c today is 6.8%. We will switch to a medication called Ozempic. It is an injectable that is only required once per week. We will slowly increase the dose every month. This medication will help with weight loss as well. First, we will need to get a prior authorization from your insurance. This may take a couple days.  Let me know if your nausea does not improve!   I have refilled your Ambien today. Please ask your pharmacy to send [place] a refill request 3-5 days before you need a refill.   For your shoulder pain: I recommend Tylenol to be taken at least twice per day until your pain improves. You can take between [phone] mg every 6-8 hours. The maximum dose per day is 4000 mg.

## 2020-07-31 ENCOUNTER — Other Ambulatory Visit: Payer: Self-pay

## 2020-07-31 DIAGNOSIS — F5101 Primary insomnia: Secondary | ICD-10-CM

## 2020-07-31 MED ORDER — ZOLPIDEM TARTRATE 5 MG PO TABS: 5.0000 mg | ORAL_TABLET | Freq: Every evening | ORAL | 0 refills | Status: DC | PRN

## 2020-07-31 NOTE — Assessment & Plan Note (Signed)
Kristen Moreno states that she has been on Ambien long-term that was prescribed her nephrologist.  Her nephrologist has since requested primary care take over the prescription.  She states that she initially started developing insomnia when she was on dialysis but has improved since her transplant.  She still requires Ambien at least once to twice per week.  When she takes Ambien she is not excessively drowsy in the morning, no sleepwalking or hallucinating.  Assessment/plan: No contraindications for Ambien at this time.  PDMP reviewed.  Will restart prescription  -Ambien 5 mg nightly before bedtime as needed

## 2020-07-31 NOTE — Assessment & Plan Note (Addendum)
Kristen Moreno states that she continues to use albuterol but only requires it once or twice a month.  Assessment/plan: Patient had PFTs in December 2021 with evidence of mild obstructive pattern with significant improvement with bronchodilator.  This is more consistent with asthma than COPD.  Patient's symptom burden is very mild.  -Continue albuterol as needed

## 2020-07-31 NOTE — Assessment & Plan Note (Signed)
Lab Results  Component Value Date   HGBA1C 6.8 (A) 07/30/2020   Kristen Moreno states that she continues to take metformin 750 mg daily.  She did run out for period of time and has since restarted it.  She has been experiencing nausea with vomiting which she notes is common for her but worsened since she restarted metformin.  She checks her CBGs daily and brought her records via iPhone.  Average morning CBGs between 120-130.  Assessment/plan: A1c is still within goal of less than 7%, however patient is having difficulty tolerating metformin.  Due to this and her concurrent weight gain, will switch to a GLP-1 agonist.  - Stop metformin - Start semaglutide 0.25 mg weekly for 4 weeks - Plan to titrate every 4 weeks to maximum tolerated dose. - Follow-up in 3 months

## 2020-07-31 NOTE — Telephone Encounter (Signed)
Received TC from pharmacist at [facility] regarding the [facility] which was sent yesterday by Dr. Charleen Kirks.  She states the RX will not go through, it won't take DEA number.  Confirmed she had correct DEA number.  Will forward to attendings to see if they could resend RX. Thank you, SChaplin, RN,BSN

## 2020-07-31 NOTE — Assessment & Plan Note (Signed)
BP: (!) 141/82  Ms. Kristen Moreno states that she continues to take amlodipine, which is filled by her nephrologist.  Assessment/plan: Blood pressure today significantly improved compared to prior.  Continue with current management.  I informed Ms. Kristen Moreno that if she needs refills she can contact our clinic as well as a nephrologist.  -Continue amlodipine 10 mg daily
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
## 2020-07-31 NOTE — Assessment & Plan Note (Signed)
Kristen Moreno was subsequently discovered to have a biceps tendon tear and underwent arthroscopic repair.  She states that since surgery, healing has been slower than she expected.  She starts physical therapy tomorrow and is nervous about the pain.  Assessment/plan: - Discussed Tylenol dosing for pain relief.

## 2020-08-03 ENCOUNTER — Telehealth: Payer: Self-pay

## 2020-08-03 NOTE — Telephone Encounter (Signed)
Received TC from patient who c/o lump in her left breast, non-painful.  States she noticed it about a month ago under her arm, now she notes it in her breast tissue.  Also c/o "a lump on the right side of my throat for 2 days".  RN asked patient if she has recently been sick and patient states she is not feeling well now, c/o fever, diarrhea, cough.  She hasn't taken a Covid test, but states she has home tests.  RN informed patient to take a home Covid test, isolation precautions reviewed.  Informed patient she will need to be seen in office for evaluation of the lump/lumps and appt made for 08/06/20 w/ Dr. Marlou Sa.  Pt instructed to call back if Covid home test is positive for more instructions and to r/s the appointment after isolation.  She verbalized understanding. SChaplin, RN,BSN

## 2020-08-06 ENCOUNTER — Encounter: Payer: Medicare Other | Admitting: Internal Medicine

## 2020-08-07 NOTE — Progress Notes (Signed)
Internal Medicine Clinic Attending  Case discussed with Dr. Basaraba  At the time of the visit.  We reviewed the resident's history and exam and pertinent patient test results.  I agree with the assessment, diagnosis, and plan of care documented in the resident's note.  

## 2020-08-28 ENCOUNTER — Telehealth: Payer: Self-pay

## 2020-08-28 NOTE — Telephone Encounter (Signed)
Received TC from patient who states her insurance company is requesting a 90 day RX on her Metformin. Metformin was discontinued at [facility] on 07/30/20, pt states she received the letter 5 days after her office visit.  Pt informed the Semaglutide took the place of the metformin and perhaps the insurance company mailed the letters before the metformin was discontinued. MD's instructions from 07/30/20 reiterated to patient:  Stop metformin - Start semaglutide 0.25 mg weekly for 4 weeks - Plan to titrate every 4 weeks to maximum tolerated dose. - Follow-up in 3 months Pt states she is taking the Semaglutide and scheduled to titrate up to 0.5mg  weekly this week.  She was reminded to schedule a f/u appt in early-mid October. No further needs. SChaplin, RN,BSN

## 2020-09-21 ENCOUNTER — Other Ambulatory Visit: Payer: Self-pay | Admitting: Orthopedic Surgery

## 2020-09-21 DIAGNOSIS — M545 Low back pain, unspecified: Secondary | ICD-10-CM

## 2020-10-01 ENCOUNTER — Ambulatory Visit
Admission: RE | Admit: 2020-10-01 | Discharge: 2020-10-01 | Disposition: A | Discharge status: Home / Self Care | Payer: Medicare Other | Source: Ambulatory Visit | Attending: Orthopedic Surgery | Admitting: Orthopedic Surgery

## 2020-10-01 DIAGNOSIS — M545 Low back pain, unspecified: Secondary | ICD-10-CM

## 2020-10-15 ENCOUNTER — Other Ambulatory Visit: Payer: Self-pay

## 2020-10-15 ENCOUNTER — Emergency Department (HOSPITAL_BASED_OUTPATIENT_CLINIC_OR_DEPARTMENT_OTHER)
Admission: EM | Admit: 2020-10-15 | Discharge: 2020-10-15 | Discharge status: Against Medical Advice | Payer: BC Managed Care – PPO

## 2020-10-15 NOTE — ED Notes (Signed)
Patient called to Triage with no Response. Registration states Patient left Prior to Triage. Patient will be discharged accordingly.

## 2020-10-16 ENCOUNTER — Ambulatory Visit
Admission: EM | Admit: 2020-10-16 | Discharge: 2020-10-16 | Disposition: A | Discharge status: Home / Self Care | Payer: Medicare Other | Attending: Urgent Care | Admitting: Urgent Care

## 2020-10-16 ENCOUNTER — Encounter: Payer: Self-pay | Admitting: Emergency Medicine

## 2020-10-16 ENCOUNTER — Other Ambulatory Visit: Payer: Self-pay

## 2020-10-16 ENCOUNTER — Ambulatory Visit (INDEPENDENT_AMBULATORY_CARE_PROVIDER_SITE_OTHER): Payer: Medicare Other

## 2020-10-16 DIAGNOSIS — J4530 Mild persistent asthma, uncomplicated: Secondary | ICD-10-CM

## 2020-10-16 DIAGNOSIS — R059 Cough, unspecified: Secondary | ICD-10-CM

## 2020-10-16 DIAGNOSIS — Z20822 Contact with and (suspected) exposure to covid-19: Secondary | ICD-10-CM

## 2020-10-16 DIAGNOSIS — J988 Other specified respiratory disorders: Secondary | ICD-10-CM | POA: Diagnosis not present

## 2020-10-16 DIAGNOSIS — Z940 Kidney transplant status: Secondary | ICD-10-CM

## 2020-10-16 DIAGNOSIS — Z87448 Personal history of other diseases of urinary system: Secondary | ICD-10-CM

## 2020-10-16 DIAGNOSIS — Z87891 Personal history of nicotine dependence: Secondary | ICD-10-CM

## 2020-10-16 DIAGNOSIS — B9789 Other viral agents as the cause of diseases classified elsewhere: Secondary | ICD-10-CM

## 2020-10-16 MED ORDER — BENZONATATE 100 MG PO CAPS: 100.0000 mg | ORAL_CAPSULE | Freq: Three times a day (TID) | ORAL | 0 refills | Status: DC | PRN

## 2020-10-16 MED ORDER — CETIRIZINE HCL 5 MG PO TABS: 5.0000 mg | ORAL_TABLET | Freq: Every day | ORAL | 0 refills | Status: DC

## 2020-10-16 MED ORDER — PREDNISONE 20 MG PO TABS: ORAL_TABLET | ORAL | 0 refills | Status: DC
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
## 2020-10-16 NOTE — ED Triage Notes (Signed)
Diarrhea, headache, sore throat, runny nose, nasal congestion, cough since Friday. One negative covid test at home, requesting another

## 2020-10-17 LAB — COVID-19, FLU A+B NAA
Influenza A, NAA: NOT DETECTED
Influenza B, NAA: NOT DETECTED
SARS-CoV-2, NAA: NOT DETECTED

## 2020-11-08 ENCOUNTER — Other Ambulatory Visit: Payer: Self-pay | Admitting: Internal Medicine

## 2020-11-08 DIAGNOSIS — K2270 Barrett's esophagus without dysplasia: Secondary | ICD-10-CM

## 2021-01-03 ENCOUNTER — Other Ambulatory Visit: Payer: Self-pay | Admitting: Neurological Surgery

## 2021-01-09 ENCOUNTER — Inpatient Hospital Stay (HOSPITAL_COMMUNITY)
Admission: EM | Admit: 2021-01-09 | Discharge: 2021-01-13 | DRG: 177 | Disposition: A | Discharge status: Home / Self Care | Payer: Medicare Other | Attending: Family Medicine | Admitting: Family Medicine

## 2021-01-09 ENCOUNTER — Emergency Department (HOSPITAL_COMMUNITY): Payer: Medicare Other

## 2021-01-09 ENCOUNTER — Other Ambulatory Visit: Payer: Self-pay

## 2021-01-09 ENCOUNTER — Encounter (HOSPITAL_COMMUNITY): Payer: Self-pay

## 2021-01-09 DIAGNOSIS — U071 COVID-19: Secondary | ICD-10-CM

## 2021-01-09 DIAGNOSIS — M419 Scoliosis, unspecified: Secondary | ICD-10-CM | POA: Diagnosis present

## 2021-01-09 DIAGNOSIS — E1122 Type 2 diabetes mellitus with diabetic chronic kidney disease: Secondary | ICD-10-CM | POA: Diagnosis present

## 2021-01-09 DIAGNOSIS — J4520 Mild intermittent asthma, uncomplicated: Secondary | ICD-10-CM | POA: Diagnosis not present

## 2021-01-09 DIAGNOSIS — E669 Obesity, unspecified: Secondary | ICD-10-CM | POA: Diagnosis present

## 2021-01-09 DIAGNOSIS — I129 Hypertensive chronic kidney disease with stage 1 through stage 4 chronic kidney disease, or unspecified chronic kidney disease: Secondary | ICD-10-CM | POA: Diagnosis present

## 2021-01-09 DIAGNOSIS — Z8249 Family history of ischemic heart disease and other diseases of the circulatory system: Secondary | ICD-10-CM

## 2021-01-09 DIAGNOSIS — J449 Chronic obstructive pulmonary disease, unspecified: Secondary | ICD-10-CM | POA: Diagnosis present

## 2021-01-09 DIAGNOSIS — Z8615 Personal history of latent tuberculosis infection: Secondary | ICD-10-CM | POA: Diagnosis not present

## 2021-01-09 DIAGNOSIS — N186 End stage renal disease: Secondary | ICD-10-CM | POA: Diagnosis not present

## 2021-01-09 DIAGNOSIS — K219 Gastro-esophageal reflux disease without esophagitis: Secondary | ICD-10-CM | POA: Diagnosis present

## 2021-01-09 DIAGNOSIS — M1990 Unspecified osteoarthritis, unspecified site: Secondary | ICD-10-CM | POA: Diagnosis present

## 2021-01-09 DIAGNOSIS — N2581 Secondary hyperparathyroidism of renal origin: Secondary | ICD-10-CM | POA: Diagnosis present

## 2021-01-09 DIAGNOSIS — E119 Type 2 diabetes mellitus without complications: Secondary | ICD-10-CM

## 2021-01-09 DIAGNOSIS — Q613 Polycystic kidney, unspecified: Secondary | ICD-10-CM | POA: Diagnosis not present

## 2021-01-09 DIAGNOSIS — Z825 Family history of asthma and other chronic lower respiratory diseases: Secondary | ICD-10-CM

## 2021-01-09 DIAGNOSIS — Z6831 Body mass index (BMI) 31.0-31.9, adult: Secondary | ICD-10-CM

## 2021-01-09 DIAGNOSIS — M4124 Other idiopathic scoliosis, thoracic region: Secondary | ICD-10-CM | POA: Diagnosis not present

## 2021-01-09 DIAGNOSIS — Z940 Kidney transplant status: Secondary | ICD-10-CM | POA: Diagnosis not present

## 2021-01-09 DIAGNOSIS — Z87891 Personal history of nicotine dependence: Secondary | ICD-10-CM | POA: Diagnosis not present

## 2021-01-09 DIAGNOSIS — J439 Emphysema, unspecified: Secondary | ICD-10-CM | POA: Diagnosis not present

## 2021-01-09 DIAGNOSIS — I10 Essential (primary) hypertension: Secondary | ICD-10-CM | POA: Diagnosis present

## 2021-01-09 DIAGNOSIS — Z8271 Family history of polycystic kidney: Secondary | ICD-10-CM | POA: Diagnosis not present

## 2021-01-09 DIAGNOSIS — J9601 Acute respiratory failure with hypoxia: Secondary | ICD-10-CM | POA: Diagnosis present

## 2021-01-09 DIAGNOSIS — Z9889 Other specified postprocedural states: Secondary | ICD-10-CM | POA: Insufficient documentation

## 2021-01-09 DIAGNOSIS — N1831 Chronic kidney disease, stage 3a: Secondary | ICD-10-CM | POA: Diagnosis present

## 2021-01-09 DIAGNOSIS — D631 Anemia in chronic kidney disease: Secondary | ICD-10-CM | POA: Diagnosis present

## 2021-01-09 DIAGNOSIS — Z79899 Other long term (current) drug therapy: Secondary | ICD-10-CM | POA: Diagnosis not present

## 2021-01-09 DIAGNOSIS — Z7952 Long term (current) use of systemic steroids: Secondary | ICD-10-CM

## 2021-01-09 DIAGNOSIS — Z888 Allergy status to other drugs, medicaments and biological substances status: Secondary | ICD-10-CM

## 2021-01-09 DIAGNOSIS — G4733 Obstructive sleep apnea (adult) (pediatric): Secondary | ICD-10-CM | POA: Diagnosis present

## 2021-01-09 DIAGNOSIS — Z90710 Acquired absence of both cervix and uterus: Secondary | ICD-10-CM | POA: Diagnosis not present

## 2021-01-09 DIAGNOSIS — E782 Mixed hyperlipidemia: Secondary | ICD-10-CM

## 2021-01-09 DIAGNOSIS — Z972 Presence of dental prosthetic device (complete) (partial): Secondary | ICD-10-CM | POA: Diagnosis not present

## 2021-01-09 DIAGNOSIS — E785 Hyperlipidemia, unspecified: Secondary | ICD-10-CM | POA: Diagnosis present

## 2021-01-09 DIAGNOSIS — E089 Diabetes mellitus due to underlying condition without complications: Secondary | ICD-10-CM | POA: Diagnosis not present

## 2021-01-09 DIAGNOSIS — J45909 Unspecified asthma, uncomplicated: Secondary | ICD-10-CM | POA: Diagnosis present

## 2021-01-09 HISTORY — DX: COVID-19: U07.1

## 2021-01-09 LAB — COMPREHENSIVE METABOLIC PANEL
ALT: 18 U/L (ref 0–44)
AST: 23 U/L (ref 15–41)
Albumin: 3.5 g/dL (ref 3.5–5.0)
Alkaline Phosphatase: 49 U/L (ref 38–126)
Anion gap: 9 (ref 5–15)
BUN: 18 mg/dL (ref 6–20)
CO2: 21 mmol/L — ABNORMAL LOW (ref 22–32)
Calcium: 8.7 mg/dL — ABNORMAL LOW (ref 8.9–10.3)
Chloride: 109 mmol/L (ref 98–111)
Creatinine, Ser: 1.1 mg/dL — ABNORMAL HIGH (ref 0.44–1.00)
GFR, Estimated: 58 mL/min — ABNORMAL LOW (ref >60)
Glucose, Bld: 105 mg/dL — ABNORMAL HIGH (ref 70–99)
Potassium: 3.9 mmol/L (ref 3.5–5.1)
Sodium: 139 mmol/L (ref 135–145)
Total Bilirubin: 0.4 mg/dL (ref 0.3–1.2)
Total Protein: 7.5 g/dL (ref 6.5–8.1)

## 2021-01-09 LAB — CBC WITH DIFFERENTIAL/PLATELET
Abs Immature Granulocytes: 0.03 10*3/uL (ref 0.00–0.07)
Basophils Absolute: 0 10*3/uL (ref 0.0–0.1)
Basophils Relative: 0 %
Eosinophils Absolute: 0 10*3/uL (ref 0.0–0.5)
Eosinophils Relative: 1 %
HCT: 44.2 % (ref 36.0–46.0)
Hemoglobin: 14.3 g/dL (ref 12.0–15.0)
Immature Granulocytes: 1 %
Lymphocytes Relative: 11 %
Lymphs Abs: 0.5 10*3/uL — ABNORMAL LOW (ref 0.7–4.0)
MCH: 29.7 pg (ref 26.0–34.0)
MCHC: 32.4 g/dL (ref 30.0–36.0)
MCV: 91.9 fL (ref 80.0–100.0)
Monocytes Absolute: 0.2 10*3/uL (ref 0.1–1.0)
Monocytes Relative: 4 %
Neutro Abs: 3.4 10*3/uL (ref 1.7–7.7)
Neutrophils Relative %: 83 %
Platelets: 233 10*3/uL (ref 150–400)
RBC: 4.81 MIL/uL (ref 3.87–5.11)
RDW: 13.5 % (ref 11.5–15.5)
WBC: 4.1 10*3/uL (ref 4.0–10.5)
nRBC: 0 % (ref 0.0–0.2)

## 2021-01-09 LAB — BLOOD GAS, VENOUS
Acid-base deficit: 1.5 mmol/L (ref 0.0–2.0)
Bicarbonate: 23.3 mmol/L (ref 20.0–28.0)
O2 Saturation: 51.3 %
Patient temperature: 98.6
pCO2, Ven: 41.9 mmHg — ABNORMAL LOW (ref 44.0–60.0)
pH, Ven: 7.364 (ref 7.250–7.430)
pO2, Ven: 31 mmHg — CL (ref 32.0–45.0)

## 2021-01-09 LAB — RESP PANEL BY RT-PCR (FLU A&B, COVID) ARPGX2
Influenza A by PCR: NEGATIVE
Influenza B by PCR: NEGATIVE
SARS Coronavirus 2 by RT PCR: POSITIVE — AB

## 2021-01-09 LAB — TROPONIN I (HIGH SENSITIVITY): Troponin I (High Sensitivity): 7 ng/L (ref <18)

## 2021-01-09 MED ORDER — HYDROCODONE-ACETAMINOPHEN 5-325 MG PO TABS
1.0000 | ORAL_TABLET | Freq: Once | ORAL | Status: AC
  Administered 2021-01-09: 1 via ORAL
  Filled 2021-01-09: qty 1

## 2021-01-09 MED ORDER — ALBUTEROL SULFATE HFA 108 (90 BASE) MCG/ACT IN AERS
2.0000 | INHALATION_SPRAY | RESPIRATORY_TRACT | Status: DC | PRN
  Administered 2021-01-09: 2 via RESPIRATORY_TRACT
  Filled 2021-01-09: qty 6.7

## 2021-01-09 MED ORDER — ZINC SULFATE 220 (50 ZN) MG PO CAPS
220.0000 mg | ORAL_CAPSULE | Freq: Every day | ORAL | Status: DC
  Administered 2021-01-10 – 2021-01-13 (×4): 220 mg via ORAL
  Filled 2021-01-09 (×4): qty 1

## 2021-01-09 MED ORDER — SODIUM CHLORIDE 0.9 % IV SOLN: 100.0000 mg | Freq: Every day | INTRAVENOUS | Status: DC

## 2021-01-09 MED ORDER — PREDNISONE 5 MG PO TABS
50.0000 mg | ORAL_TABLET | Freq: Every day | ORAL | Status: DC
  Administered 2021-01-13: 09:00:00 50 mg via ORAL
  Filled 2021-01-09: qty 2

## 2021-01-09 MED ORDER — INSULIN DETEMIR 100 UNIT/ML ~~LOC~~ SOLN
0.0750 [IU]/kg | Freq: Two times a day (BID) | SUBCUTANEOUS | Status: DC
  Administered 2021-01-10 – 2021-01-12 (×7): 7 [IU] via SUBCUTANEOUS
  Filled 2021-01-09 (×9): qty 0.07

## 2021-01-09 MED ORDER — GUAIFENESIN-DM 100-10 MG/5ML PO SYRP
10.0000 mL | ORAL_SOLUTION | ORAL | Status: DC | PRN
  Administered 2021-01-10 (×3): 10 mL via ORAL
  Filled 2021-01-09 (×3): qty 10

## 2021-01-09 MED ORDER — SODIUM CHLORIDE 0.9 % IV SOLN
100.0000 mg | INTRAVENOUS | Status: DC
  Administered 2021-01-10 – 2021-01-12 (×3): 100 mg via INTRAVENOUS
  Filled 2021-01-09 (×3): qty 20

## 2021-01-09 MED ORDER — IOHEXOL 350 MG/ML SOLN
80.0000 mL | Freq: Once | INTRAVENOUS | Status: AC | PRN
  Administered 2021-01-09: 23:00:00 80 mL via INTRAVENOUS

## 2021-01-09 MED ORDER — SODIUM CHLORIDE 0.9 % IV SOLN
100.0000 mg | INTRAVENOUS | Status: AC
  Administered 2021-01-10 (×2): 100 mg via INTRAVENOUS
  Filled 2021-01-09 (×2): qty 20

## 2021-01-09 MED ORDER — SODIUM CHLORIDE 0.9 % IV SOLN: 200.0000 mg | Freq: Once | INTRAVENOUS | Status: DC

## 2021-01-09 MED ORDER — LINAGLIPTIN 5 MG PO TABS
5.0000 mg | ORAL_TABLET | Freq: Every day | ORAL | Status: DC
  Administered 2021-01-10 – 2021-01-13 (×4): 5 mg via ORAL
  Filled 2021-01-09 (×4): qty 1

## 2021-01-09 MED ORDER — METHYLPREDNISOLONE SODIUM SUCC 125 MG IJ SOLR
1.0000 mg/kg | Freq: Two times a day (BID) | INTRAMUSCULAR | Status: AC
  Administered 2021-01-10 – 2021-01-12 (×6): 88.75 mg via INTRAVENOUS
  Filled 2021-01-09 (×6): qty 2

## 2021-01-09 MED ORDER — HYDROCOD POLST-CPM POLST ER 10-8 MG/5ML PO SUER: 5.0000 mL | Freq: Two times a day (BID) | ORAL | Status: DC | PRN

## 2021-01-09 MED ORDER — ASCORBIC ACID 500 MG PO TABS
500.0000 mg | ORAL_TABLET | Freq: Every day | ORAL | Status: DC
  Administered 2021-01-10 – 2021-01-13 (×4): 500 mg via ORAL
  Filled 2021-01-09 (×4): qty 1

## 2021-01-09 MED ORDER — INSULIN ASPART 100 UNIT/ML IJ SOLN
0.0000 [IU] | INTRAMUSCULAR | Status: DC
  Administered 2021-01-10: 05:00:00 2 [IU] via SUBCUTANEOUS
  Administered 2021-01-10 (×2): 3 [IU] via SUBCUTANEOUS
  Administered 2021-01-10: 7 [IU] via SUBCUTANEOUS
  Administered 2021-01-11: 23:00:00 2 [IU] via SUBCUTANEOUS
  Administered 2021-01-11: 09:00:00 1 [IU] via SUBCUTANEOUS
  Administered 2021-01-11: 18:00:00 3 [IU] via SUBCUTANEOUS
  Administered 2021-01-11 (×2): 2 [IU] via SUBCUTANEOUS
  Administered 2021-01-11: 20:00:00 1 [IU] via SUBCUTANEOUS
  Administered 2021-01-11: 05:00:00 2 [IU] via SUBCUTANEOUS
  Administered 2021-01-12: 20:00:00 3 [IU] via SUBCUTANEOUS
  Administered 2021-01-12: 04:00:00 2 [IU] via SUBCUTANEOUS
  Administered 2021-01-12: 14:00:00 3 [IU] via SUBCUTANEOUS
  Administered 2021-01-12: 10:00:00 1 [IU] via SUBCUTANEOUS
  Administered 2021-01-12: 17:00:00 3 [IU] via SUBCUTANEOUS
  Administered 2021-01-13: 1 [IU] via SUBCUTANEOUS
  Filled 2021-01-09: qty 0.09
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
## 2021-01-09 NOTE — ED Triage Notes (Signed)
Pt reports she was diagnosed with covid last Tuesday but over the last 2 days she has felt SOB, lack of appetite and nausea, denies vomiting. She reports generalized body aches. She also reports pain to right shoulder and back after a fall on 12/2 in which she states she tripped.

## 2021-01-09 NOTE — H&P (Signed)
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 stable, albuterol PRN        Chronic obstructive pulmonary disease ([place]) Low  Yes    Current Assessment & Plan 01/09/2021 Hospital Encounter Written 01/10/2021 12:12 AM by Toy Baker, MD     Contributing to severity of COVID infection         Scoliosis of thoracic spine Low  Yes    Current Assessment & Plan 01/09/2021 Hospital Encounter Written 01/10/2021 12:13 AM by Toy Baker, MD     Pt was supposed to undergo operative intervention in January        Other plan as per orders.  DVT prophylaxis:   Lovenox      Code Status:    Code  Status: Prior FULL CODE  as per patient   I had personally discussed CODE STATUS with patient      Family Communication:   Family not at  Bedside    Disposition Plan:        To home once workup is complete and patient is stable  Following barriers for discharge:                                       Afebrile, white count improving able to transition to PO antibiotics                            Will likely need home health, home O2, set up                           Will need consultants to evaluate patient prior to discharge                      Consults called: nephrology   Admission status:  ED Disposition     ED Disposition  [place]: [place] [[id number]]  Level of Care: Progressive [102]  Admit to Progressive based on following criteria: RESPIRATORY PROBLEMS hypoxemic/hypercapnic respiratory failure that is responsive to NIPPV (BiPAP) or High Flow Nasal Cannula (6-80 lpm). Frequent assessment/intervention, no > Q2 hrs < Q4 hrs, to maintain oxygenation and pulmonary hygiene.  May admit patient to [facility] or [facility] if equivalent level of care is available:: No  Covid Evaluation: Confirmed COVID Positive  Diagnosis: COVID-19 virus infection [[id number]]  Admitting Physician: Toy Baker [[id number]]  Attending Physician: Toy Baker [[id number]]  Estimated length of stay: past midnight tomorrow  Certification:: I certify this patient will need inpatient services for at least 2 midnights              inpatient     I Expect 2 midnight stay secondary to severity of patient's current illness need for inpatient interventions justified by the following:  hemodynamic instability despite optimal treatment (tachycardia   tachypnea  hypoxia )   Severe lab/radiological/exam abnormalities including:     and extensive comorbidities including:immunosupression  That are currently affecting medical  management.   I expect  patient to be hospitalized for 2 midnights requiring inpatient medical care.  Patient is at high risk for adverse outcome (such as loss of life or disability) if not treated.  Indication for inpatient stay as follows:    New or worsening hypoxia   Need for IV antivirals      Level of care        progressive tele indefinitely please discontinue once patient no longer qualifies COVID-19 Labs    Lab Results  Component Value Date   [place] 01/03/2020     Precautions: admitted as covid positive Airborne and Contact precautions   Ananda Caya 01/10/2021, 1:08 AM    [facility]     after 2 AM please page floor coverage PA If 7AM-7PM, please contact the day team taking care of the patient using [email or web address]   Patient was evaluated in the context of the global COVID-19 pandemic, which necessitated consideration that the patient might be at risk for infection with the SARS-CoV-2 virus that causes COVID-19. Institutional protocols and algorithms that pertain to the evaluation of patients at risk for COVID-19 are in a state of rapid change based on information released by regulatory bodies including the CDC and federal and state organizations. These policies and algorithms were followed during the patient's care.

## 2021-01-09 NOTE — ED Notes (Signed)
Patient desat to 86% on RA. Pt placed on 2L. Pt o2sat 95-98%.

## 2021-01-10 ENCOUNTER — Encounter (HOSPITAL_COMMUNITY): Payer: Self-pay | Admitting: Internal Medicine

## 2021-01-10 DIAGNOSIS — J9601 Acute respiratory failure with hypoxia: Secondary | ICD-10-CM

## 2021-01-10 DIAGNOSIS — Z940 Kidney transplant status: Secondary | ICD-10-CM

## 2021-01-10 HISTORY — DX: Acute respiratory failure with hypoxia: J96.01

## 2021-01-10 LAB — CBC WITH DIFFERENTIAL/PLATELET
Abs Immature Granulocytes: 0.04 10*3/uL (ref 0.00–0.07)
Basophils Absolute: 0 10*3/uL (ref 0.0–0.1)
Basophils Relative: 0 %
Eosinophils Absolute: 0 10*3/uL (ref 0.0–0.5)
Eosinophils Relative: 0 %
HCT: 40.8 % (ref 36.0–46.0)
Hemoglobin: 13.2 g/dL (ref 12.0–15.0)
Immature Granulocytes: 1 %
Lymphocytes Relative: 5 %
Lymphs Abs: 0.2 10*3/uL — ABNORMAL LOW (ref 0.7–4.0)
MCH: 29.7 pg (ref 26.0–34.0)
MCHC: 32.4 g/dL (ref 30.0–36.0)
MCV: 91.9 fL (ref 80.0–100.0)
Monocytes Absolute: 0.1 10*3/uL (ref 0.1–1.0)
Monocytes Relative: 2 %
Neutro Abs: 4.3 10*3/uL (ref 1.7–7.7)
Neutrophils Relative %: 92 %
Platelets: 215 10*3/uL (ref 150–400)
RBC: 4.44 MIL/uL (ref 3.87–5.11)
RDW: 13.6 % (ref 11.5–15.5)
WBC: 4.7 10*3/uL (ref 4.0–10.5)
nRBC: 0 % (ref 0.0–0.2)

## 2021-01-10 LAB — LACTATE DEHYDROGENASE: LDH: 288 U/L — ABNORMAL HIGH (ref 98–192)

## 2021-01-10 LAB — COMPREHENSIVE METABOLIC PANEL
ALT: 17 U/L (ref 0–44)
AST: 20 U/L (ref 15–41)
Albumin: 3.3 g/dL — ABNORMAL LOW (ref 3.5–5.0)
Alkaline Phosphatase: 48 U/L (ref 38–126)
Anion gap: 10 (ref 5–15)
BUN: 18 mg/dL (ref 6–20)
CO2: 20 mmol/L — ABNORMAL LOW (ref 22–32)
Calcium: 8.4 mg/dL — ABNORMAL LOW (ref 8.9–10.3)
Chloride: 107 mmol/L (ref 98–111)
Creatinine, Ser: 1.07 mg/dL — ABNORMAL HIGH (ref 0.44–1.00)
GFR, Estimated: >60 mL/min (ref >60)
Glucose, Bld: 178 mg/dL — ABNORMAL HIGH (ref 70–99)
Potassium: 4 mmol/L (ref 3.5–5.1)
Sodium: 137 mmol/L (ref 135–145)
Total Bilirubin: 0.5 mg/dL (ref 0.3–1.2)
Total Protein: 7.1 g/dL (ref 6.5–8.1)

## 2021-01-10 LAB — FERRITIN: Ferritin: 1202 ng/mL — ABNORMAL HIGH (ref 11–307)

## 2021-01-10 LAB — TROPONIN I (HIGH SENSITIVITY): Troponin I (High Sensitivity): 6 ng/L (ref <18)

## 2021-01-10 LAB — D-DIMER, QUANTITATIVE: D-Dimer, Quant: 1.04 ug/mL-FEU — ABNORMAL HIGH (ref 0.00–0.50)

## 2021-01-10 LAB — HIV ANTIBODY (ROUTINE TESTING W REFLEX): HIV Screen 4th Generation wRfx: NONREACTIVE

## 2021-01-10 LAB — CBG MONITORING, ED
Glucose-Capillary: 182 mg/dL — ABNORMAL HIGH (ref 70–99)
Glucose-Capillary: 179 mg/dL — ABNORMAL HIGH (ref 70–99)
Glucose-Capillary: 179 mg/dL — ABNORMAL HIGH (ref 70–99)

## 2021-01-10 LAB — GLUCOSE, CAPILLARY
Glucose-Capillary: 241 mg/dL — ABNORMAL HIGH (ref 70–99)
Glucose-Capillary: 201 mg/dL — ABNORMAL HIGH (ref 70–99)

## 2021-01-10 LAB — PHOSPHORUS: Phosphorus: 2.7 mg/dL (ref 2.5–4.6)

## 2021-01-10 LAB — C-REACTIVE PROTEIN: CRP: 13.4 mg/dL — ABNORMAL HIGH (ref <1.0)

## 2021-01-10 LAB — PROCALCITONIN: Procalcitonin: <0.1 ng/mL

## 2021-01-10 LAB — TSH: TSH: 0.469 u[IU]/mL (ref 0.350–4.500)

## 2021-01-10 LAB — FIBRINOGEN: Fibrinogen: 577 mg/dL — ABNORMAL HIGH (ref 210–475)

## 2021-01-10 LAB — MAGNESIUM: Magnesium: 1.7 mg/dL (ref 1.7–2.4)

## 2021-01-10 MED ORDER — AMLODIPINE BESYLATE 10 MG PO TABS
10.0000 mg | ORAL_TABLET | Freq: Every day | ORAL | Status: DC
  Administered 2021-01-10 – 2021-01-13 (×4): 10 mg via ORAL
  Filled 2021-01-10 (×3): qty 1
  Filled 2021-01-10: qty 2

## 2021-01-10 MED ORDER — TACROLIMUS 1 MG PO CAPS
4.0000 mg | ORAL_CAPSULE | Freq: Two times a day (BID) | ORAL | Status: DC
  Administered 2021-01-10 – 2021-01-13 (×7): 4 mg via ORAL
  Filled 2021-01-10 (×8): qty 4

## 2021-01-10 MED ORDER — SODIUM CHLORIDE 0.9 % IV SOLN: INTRAVENOUS | Status: DC | PRN

## 2021-01-10 MED ORDER — ENOXAPARIN SODIUM 40 MG/0.4ML IJ SOSY
40.0000 mg | PREFILLED_SYRINGE | INTRAMUSCULAR | Status: DC
  Administered 2021-01-10 – 2021-01-13 (×4): 40 mg via SUBCUTANEOUS
  Filled 2021-01-10 (×4): qty 0.4

## 2021-01-10 MED ORDER — HYDROCODONE-ACETAMINOPHEN 5-325 MG PO TABS
1.0000 | ORAL_TABLET | ORAL | Status: DC | PRN
  Administered 2021-01-10 – 2021-01-13 (×9): 2 via ORAL
  Filled 2021-01-10 (×9): qty 2

## 2021-01-10 MED ORDER — SODIUM CHLORIDE 0.9% FLUSH
3.0000 mL | Freq: Two times a day (BID) | INTRAVENOUS | Status: DC
  Administered 2021-01-10 – 2021-01-13 (×6): 3 mL via INTRAVENOUS

## 2021-01-10 MED ORDER — SODIUM CHLORIDE 0.9% FLUSH: 3.0000 mL | INTRAVENOUS | Status: DC | PRN

## 2021-01-10 MED ORDER — SODIUM BICARBONATE 650 MG PO TABS
650.0000 mg | ORAL_TABLET | Freq: Two times a day (BID) | ORAL | Status: DC
  Administered 2021-01-10 – 2021-01-13 (×7): 650 mg via ORAL
  Filled 2021-01-10 (×8): qty 1

## 2021-01-10 MED ORDER — SODIUM CHLORIDE 0.9 % IV SOLN: 250.0000 mL | INTRAVENOUS | Status: DC | PRN

## 2021-01-10 MED ORDER — ACETAMINOPHEN 650 MG RE SUPP: 650.0000 mg | Freq: Four times a day (QID) | RECTAL | Status: DC | PRN

## 2021-01-10 MED ORDER — ALBUTEROL SULFATE HFA 108 (90 BASE) MCG/ACT IN AERS
1.0000 | INHALATION_SPRAY | Freq: Four times a day (QID) | RESPIRATORY_TRACT | Status: DC | PRN
Start: 2021-01-10 — End: 2021-01-10

## 2021-01-10 MED ORDER — ACETAMINOPHEN 325 MG PO TABS
650.0000 mg | ORAL_TABLET | Freq: Four times a day (QID) | ORAL | Status: DC | PRN
  Filled 2021-01-10: qty 2

## 2021-01-10 NOTE — Assessment & Plan Note (Signed)
stable °

## 2021-01-10 NOTE — ED Notes (Signed)
Pt unable to tolerate ambulating. This tech assisted the pt to bedside and attempted to ambulate pt. Upon pt standing, pt began to march in place. Pt O2 sat dropped to 87% on 3L [place]. Pt c/o SOB. Pt returned to bedside. O2 sat returned to 92%. Pt then asked to be assisted to the restroom. Bedside commode retrieved and placed at bedside. Pt assisted to use commode. Pt O2 sat at 87% upon exertion. Pt returned to bedside and provided with ice water. Will continue to monitor pt.

## 2021-01-10 NOTE — Assessment & Plan Note (Signed)
Pt was supposed to undergo operative intervention in January

## 2021-01-10 NOTE — Progress Notes (Signed)
Pt stated she has been coughing up small black excretions and has some blood from her nose when she blows her nose.  Sputum sample ordered - will collect when pt able to produce sample.  MD notified.  Will continue to follow.

## 2021-01-10 NOTE — Assessment & Plan Note (Signed)
Continue NOrvasc

## 2021-01-10 NOTE — Assessment & Plan Note (Signed)
Contributing to severity of COVID infection

## 2021-01-10 NOTE — Assessment & Plan Note (Signed)
COVID DM management protocol Order SSI

## 2021-01-10 NOTE — Assessment & Plan Note (Signed)
chronic stable, albuterol PRN

## 2021-01-10 NOTE — Assessment & Plan Note (Addendum)
Hold Myfortic decreased prograf to 4mg  BID, discussed case with Dr. Shary Decamp with Nephrology appreciate their consult

## 2021-01-10 NOTE — Assessment & Plan Note (Signed)
chronic stable ?

## 2021-01-10 NOTE — Hospital Course (Addendum)
Kristen Moreno is a 60 y.o. F with ESRD s/p DDKD, obesity, CKD IIIa baseline 1.4 OSA not on CPAP, DM, and HTN who presented with fever, chills, cough, and SOB after being diagnosed with COVID 10 days ago.  12/21: In the ER, she was tachypneic up to 32 bpm and desaturated to 86% and so was admitted. 12/23: O2 needs stable 12/24 weaning but still on o2, improving

## 2021-01-10 NOTE — Assessment & Plan Note (Addendum)
Nephrology following peripherally.  Cr stable -Continue Prograf at reduced dose - Hold MMF  -Continue steroids for COVID

## 2021-01-10 NOTE — Assessment & Plan Note (Addendum)
CRP improving, feels slightly less malaise, aches than yesterday, still needs O2 - Continue steroids - Continue remdesivir - Trend CRP

## 2021-01-10 NOTE — Assessment & Plan Note (Signed)
See above

## 2021-01-10 NOTE — Assessment & Plan Note (Signed)
BP normal - Continue amlodipine 

## 2021-01-10 NOTE — Progress Notes (Signed)
°  Progress Note   Patient: Kristen Moreno NOM:[id number] DOB: 03/05/1960 DOA: 01/09/2021     1 DOS: the patient was seen and examined on 01/10/2021   Brief hospital course: Kristen Moreno is a 60 y.o. F with ESRD s/p DDKD, obesity, CKD IIIa baseline 1.4 OSA not on CPAP, DM, and HTN who presented with fever, chills, cough, and SOB after being diagnosed with COVID 10 days ago.  12/21: In the ER, she was tachypneic up to 32 bpm and desaturated to 86% and so was admitted.  Assessment and Plan * COVID-19 virus infection- (present on admission) - Continue steroids - Continue remdesivir - Trend CRP  Acute respiratory failure with hypoxia ([place])- (present on admission) Still dyspneic and desaturating with minimal exertion into the 80s, RR >30 Due to COVID  Chronic obstructive pulmonary disease ([place])- (present on admission) See above  Asthma- (present on admission) -Continue steroids -Continue as needed albuterol  S/p cadaver renal transplant -Continue Prograf, hold MMF  ESRD (end stage renal disease) ([place])- (present on admission) -Continue Prograf - Hold MMF -Continue steroids for COVID  DM (diabetes mellitus) ([place]) Glucose normal -Continue Levemir -Continue SS corrections -Continue linagliptin  Hypertension- (present on admission) BP normal -Continue amlodipine     Subjective: Feeling very weak and achy and tired.  Has a cough.  No fever.  No confusion.  No respiratory distress but feels dyspneic.  Objective Reviewed and remarkable for hypoxia, increased respiratory rate.  Afebrile. General appearance: Adult female, lying in bed, no acute distress, appears very tired and sick     HEENT: Nasal cannula in place, oropharynx tacky dry, no oral lesions, eyes normal Skin: No suspicious rashes or lesions Respiratory: Normal respiratory rate and rhythm, lungs clear without rales or wheezes Cardiac: RRR, no murmurs, no lower extremity edema Abdomen: Soft, no tenderness  palpation, no guarding. MSK:  Neuro: Awake and alert, extraocular movement intact, face symmetric, speech fluent Psych: Attention normal, affect normal, judgment somewhat blunted by fatigue.   Data Reviewed: Labs notable for creatinine 1.1, stable, CRP elevated, ferritin elevated, LFTs normal  Family Communication: Aunt by phone  Disposition: Status is: Inpatient  Remains inpatient appropriate because: She has significant hypoxia, and is at high risk for decompensation due to COVID-19 i immune suppressed           Author: Edwin Dada 01/10/2021 5:56 PM  For on call review [email or web address].

## 2021-01-10 NOTE — Assessment & Plan Note (Signed)
-  Continue Prograf, hold MMF

## 2021-01-10 NOTE — Assessment & Plan Note (Signed)
-  Continue steroids -Continue as needed albuterol

## 2021-01-10 NOTE — Assessment & Plan Note (Addendum)
Glucose well controlled -Continue Levemir -Continue SS corrections -Continue linagliptin

## 2021-01-10 NOTE — Assessment & Plan Note (Addendum)
FROM HOME  WITH KNOWN HX OF COVID19  Increased severity in the setting of being immuno compromised due to being on immuno suppression for renal transplant Immunization status:    Following concerning LAB/ imaging findings:  COVID-19 Labs  No results for input(s): DDIMER, FERRITIN, LDH, CRP in the last 72 hours. Hepatic Function Latest Ref Rng & Units 01/09/2021 02/22/2020 01/25/2019  Total Protein 6.5 - 8.1 g/dL 7.5 7.8 6.9  Albumin 3.5 - 5.0 g/dL 3.5 4.1 3.3(L)  AST 15 - 41 U/L 23 15 14(L)  ALT 0 - 44 U/L _0 Alk Phosphatase 38 - 126 U/L 49 64 37(L)  Total Bilirubin 0.3 - 1.2 mg/dL 0.4 0.3 0.4  Bilirubin, Direct 0.0 - 0.3 mg/dL - - -   Lab Results  Component Value Date   CREATININE 1.10 (H) 01/09/2021   CREATININE 1.40 (H) 07/17/2020   CBC    Component Value Date/Time   WBC 4.1 01/09/2021 2139   PLT 233 01/09/2021 2139   LYMPHSABS 0.5 (L) 01/09/2021 2139    Lab Results  Component Value Date   SARSCOV2NAA POSITIVE (A) 01/09/2021   [place] NEGATIVE 01/03/2020   [place] NEGATIVE 12/17/2019   [place] Not Detected 12/17/2019    CBC: leukopenia, lymphopenia      CXR: hazy bilateral peripheral opacities or otherwise abnormal     CT chest: GGO,       CTA neg for PE   Following complications noted:  acute respiratory failure with hypoxia - continue oxygen treatment  Plan of treatment: Admit on [place] Precautions  -given severity of illness initiate steroid solumedrol And pharmacy consult for remdesivir - contraindications for Baricitinib or Actemra   - Will follow daily d.dimer - Assess for ability to prone  - Supportive management -Fluid sparing resuscitation  -Provide oxygen as needed currently on   SpO2: 95 % O2 Flow Rate (L/min): 2 L/min - IF d.dimer elvated >5 will increase dose of lovenox    The treatment plan and use of medications and known side effects were discussed with patient  It was clearly explained that there is no proven  definitive treatment for COVID-19 infection yet. Any medications used here are based on case reports/anecdotal data which are not peer-reviewed and has not been studied using randomized control trials.  Complete risks and long-term side effects are unknown, however in the best clinical judgment they seem to be of some clinical benefit rather than medical risks.  Patient  agree with the treatment plan and want to receive these treatments as indicated.

## 2021-01-10 NOTE — Progress Notes (Signed)
Contacted by Dr. Roel Cluck last night of Kristen Moreno's admission and diagnosis of covid-19 with acute hypoxic respiratory failure and need for hospitalization.  Have recommended to hold myfortic for now and to lower prograf dose to 4 mg bid.  Agree with IV solumedrol.  Renal function is at baseline and normal CBC.  Will follow her labs while she remains an inpatient and will place an official consult if her condition and/or renal function were to worsen.  Agree with remdesivir and would avoid paxlovomid given interaction with tacrolimus.  Please call with any questions or concerns.

## 2021-01-10 NOTE — Assessment & Plan Note (Signed)
this patient has acute respiratory failure with Hypoxia as documented by the presence of following: O2 saturatio< 90% on RA  Likely due to  COVID pneumonia Provide O2 therapy and titrate as needed  Continuous pulse ox   check Pulse ox with ambulation prior to discharge  may need  TC consult for home O2 set up   Prone if able

## 2021-01-10 NOTE — ED Notes (Signed)
Pt provided with meal tray.

## 2021-01-10 NOTE — Assessment & Plan Note (Addendum)
Still dyspneic but RR improved, SpO2 needs improved.  Coughing scant dark stuff yesterday, this is now resolved.   Respiratory failure from COVID, improving slightly still hypoxic

## 2021-01-11 DIAGNOSIS — N186 End stage renal disease: Secondary | ICD-10-CM

## 2021-01-11 LAB — COMPREHENSIVE METABOLIC PANEL
ALT: 15 U/L (ref 0–44)
AST: 16 U/L (ref 15–41)
Albumin: 2.9 g/dL — ABNORMAL LOW (ref 3.5–5.0)
Alkaline Phosphatase: 41 U/L (ref 38–126)
Anion gap: 10 (ref 5–15)
BUN: 23 mg/dL — ABNORMAL HIGH (ref 6–20)
CO2: 18 mmol/L — ABNORMAL LOW (ref 22–32)
Calcium: 8.6 mg/dL — ABNORMAL LOW (ref 8.9–10.3)
Chloride: 111 mmol/L (ref 98–111)
Creatinine, Ser: 1.03 mg/dL — ABNORMAL HIGH (ref 0.44–1.00)
GFR, Estimated: >60 mL/min (ref >60)
Glucose, Bld: 166 mg/dL — ABNORMAL HIGH (ref 70–99)
Potassium: 4.1 mmol/L (ref 3.5–5.1)
Sodium: 139 mmol/L (ref 135–145)
Total Bilirubin: 0.4 mg/dL (ref 0.3–1.2)
Total Protein: 6.4 g/dL — ABNORMAL LOW (ref 6.5–8.1)

## 2021-01-11 LAB — GLUCOSE, CAPILLARY
Glucose-Capillary: 127 mg/dL — ABNORMAL HIGH (ref 70–99)
Glucose-Capillary: 146 mg/dL — ABNORMAL HIGH (ref 70–99)
Glucose-Capillary: 155 mg/dL — ABNORMAL HIGH (ref 70–99)
Glucose-Capillary: 156 mg/dL — ABNORMAL HIGH (ref 70–99)
Glucose-Capillary: 162 mg/dL — ABNORMAL HIGH (ref 70–99)
Glucose-Capillary: 168 mg/dL — ABNORMAL HIGH (ref 70–99)
Glucose-Capillary: 245 mg/dL — ABNORMAL HIGH (ref 70–99)

## 2021-01-11 LAB — C-REACTIVE PROTEIN: CRP: 8.9 mg/dL — ABNORMAL HIGH (ref <1.0)

## 2021-01-11 NOTE — Progress Notes (Signed)
°  Progress Note   Patient: Kristen Moreno [id number] DOB: January 24, 1960 DOA: 01/09/2021     2 DOS: the patient was seen and examined on 01/11/2021   Brief hospital course: Kristen Moreno is a 60 y.o. F with ESRD s/p DDKD, obesity, CKD IIIa baseline 1.4 OSA not on CPAP, DM, and HTN who presented with fever, chills, cough, and SOB after being diagnosed with COVID 10 days ago.  12/21: In the ER, she was tachypneic up to 32 bpm and desaturated to 86% and so was admitted. 12/23: O2 needs stable       Assessment and Plan * COVID-19 virus infection- (present on admission) Treated with [place] as an outpaitent, no improvement.  Here started on steroids and remdes and CRP improving, feels slightly less malaise, aches than yesterday, still needs O2 - Continue steroids - Continue remdesivir - Trend CRP    Asthma-  with probably exacerbation -Continue steroids -Continue as needed albuterol  S/p cadaver renal transplant ESRD (end stage renal disease) ([place])- (present on admission) Nephrology following peripherally.  Cr stable -Continue Prograf at reduced dose - Hold MMF  -Continue steroids for COVID   DM (diabetes mellitus) ([facility]) Glucose well controlled -Continue Levemir -Continue SS corrections -Continue linagliptin  Hypertension- (present on admission) BP normal -Continue amlodipine     Subjective: Patient still achy, tired, dyspneic, but overall she is improving.  Her energy level is okay.  Her appetite is not bad.  She has no confusion, fever.  No respiratory distress.  No hemoptysis.  Objective Vital signs reviewed and remarkable for hypoxia wearing 2 L supplemental nasal cannula General appearance: Adult female, lying in bed, no acute distress, interactive, appears tired     HEENT: Nasal cannula in place, no nasal discharge or epistaxis.  Oropharynx tacky dry, no oral lesions Skin:  Cardiac: RRR, no murmurs, no lower extremity edema Respiratory: Normal respiratory  rate and rhythms, crackles at bilateral bases, fine.  No wheezing. Abdomen: Soft without tenderness palpation or guarding, no ascites or distention MSK: Normal muscle bulk and tone Neuro: Awake and alert, extraocular movements intact, moves upper extremities with generalized weakness but symmetric strength Psych: Attention normal, affect normal, judgment insight appear normal   Data Reviewed: Labs notable for stable creatinine, abdomen, significantly improved.,  Glucose good  Family Communication:    Disposition: Status is: Inpatient  Remains inpatient appropriate because: Ongoing oxygen needs, needs for continued IV remdesivir given failure of oral therapy for COVID            Author: Edwin Dada 01/11/2021 11:54 AM  For on call review [email or web address].

## 2021-01-11 NOTE — Progress Notes (Signed)
Transition of Care [facility]) Screening Note  Patient Details  Name: Kristen Moreno Date of Birth: 01-Nov-1960  [facility]) CM/SW Contact:    Sherie Don, LCSW Phone Number: 01/11/2021, 2:20 PM  Transition of Care Department [facility]) has reviewed patient and no TOC needs have been identified at this time. We will continue to monitor patient advancement through interdisciplinary progression rounds. If new patient transition needs arise, please place a TOC consult.

## 2021-01-11 NOTE — Plan of Care (Signed)
  Problem: Education: Goal: Knowledge of General Education information will improve Description: Including pain rating scale, medication(s)/side effects and non-pharmacologic comfort measures Outcome: Progressing   Problem: Coping: Goal: Level of anxiety will decrease Outcome: Progressing   Problem: Safety: Goal: Ability to remain free from injury will improve Outcome: Progressing   

## 2021-01-12 LAB — BASIC METABOLIC PANEL
Anion gap: 9 (ref 5–15)
BUN: 32 mg/dL — ABNORMAL HIGH (ref 6–20)
CO2: 20 mmol/L — ABNORMAL LOW (ref 22–32)
Calcium: 8.6 mg/dL — ABNORMAL LOW (ref 8.9–10.3)
Chloride: 112 mmol/L — ABNORMAL HIGH (ref 98–111)
Creatinine, Ser: 1.09 mg/dL — ABNORMAL HIGH (ref 0.44–1.00)
GFR, Estimated: 59 mL/min — ABNORMAL LOW (ref >60)
Glucose, Bld: 162 mg/dL — ABNORMAL HIGH (ref 70–99)
Potassium: 4.3 mmol/L (ref 3.5–5.1)
Sodium: 141 mmol/L (ref 135–145)

## 2021-01-12 LAB — GLUCOSE, CAPILLARY
Glucose-Capillary: 165 mg/dL — ABNORMAL HIGH (ref 70–99)
Glucose-Capillary: 142 mg/dL — ABNORMAL HIGH (ref 70–99)
Glucose-Capillary: 203 mg/dL — ABNORMAL HIGH (ref 70–99)
Glucose-Capillary: 246 mg/dL — ABNORMAL HIGH (ref 70–99)
Glucose-Capillary: 202 mg/dL — ABNORMAL HIGH (ref 70–99)
Glucose-Capillary: 208 mg/dL — ABNORMAL HIGH (ref 70–99)

## 2021-01-12 NOTE — Plan of Care (Signed)
  Problem: Education: Goal: Knowledge of General Education information will improve Description Including pain rating scale, medication(s)/side effects and non-pharmacologic comfort measures Outcome: Progressing   

## 2021-01-12 NOTE — Progress Notes (Signed)
°  Progress Note   Patient: Kristen Moreno [id number]:[id number] DOB: 1960/11/13 DOA: 01/09/2021     3 DOS: the patient was seen and examined on 01/12/2021   Brief hospital course: Kristen Moreno is a 60 y.o. F with ESRD s/p DDKD, obesity, CKD IIIa baseline 1.4 OSA not on CPAP, DM, and HTN who presented with fever, chills, cough, and SOB after being diagnosed with COVID 10 days ago.  12/21: In the ER, she was tachypneic up to 32 bpm and desaturated to 86% and so was admitted. 12/23: O2 needs stable 12/24 weaning but still on o2, improving  Assessment and Plan * COVID-19 virus infection- (present on admission) CRP improving, feels slightly less malaise, aches than yesterday, still needs O2 - Continue steroids - Continue remdesivir - Trend CRP  Acute respiratory failure with hypoxia ([place])- (present on admission) Still dyspneic but RR improved, SpO2 needs improved.  Coughing scant dark stuff yesterday, this is now resolved.   Respiratory failure from COVID, improving slightly still hypoxic  Chronic obstructive pulmonary disease ([place])- (present on admission) See above  Asthma- (present on admission) -Continue steroids -Continue as needed albuterol  S/p cadaver renal transplant -Continue Prograf, hold MMF  ESRD (end stage renal disease) ([place])- (present on admission) Nephrology following peripherally.  Cr stable -Continue Prograf at reduced dose - Hold MMF  -Continue steroids for COVID   DM (diabetes mellitus) ([facility]) Glucose well controlled -Continue Levemir -Continue SS corrections -Continue linagliptin  Hypertension- (present on admission) BP normal -Continue amlodipine     Subjective: Improving, no hemoptysis, fever, dyspnea at rest.  Still requiring some oxygen.  Still very fatigued and out of breath with ambulation.  Objective Vital signs were reviewed and unremarkable except for hypoxia. General appearance: Adult female, lying in bed, no acute distress, interactive,  appears tired     HEENT: Nasal cannula in place, no nasal discharge or epistaxis.  Oropharynx tacky dry, no oral lesions Skin:  Cardiac: RRR, no murmurs, no lower extremity edema Respiratory: Normal respiratory rate and rhythms, crackles at bilateral bases, fine.  No wheezing. Abdomen: Soft without tenderness palpation or guarding, no ascites or distention MSK: Normal muscle bulk and tone Neuro: Awake and alert, extraocular movements intact, moves upper extremities with generalized weakness but symmetric strength Psych: Attention normal, affect normal, judgment insight appear normal   Data Reviewed: Stable electrolytes and renal function  Family Communication:    Disposition: Status is: Inpatient  Remains inpatient appropriate because: Slowly improving but still on O2          Author: Edwin Dada 01/12/2021 2:54 PM  For on call review [email or web address].

## 2021-01-12 NOTE — Plan of Care (Signed)

## 2021-01-12 NOTE — Progress Notes (Signed)
Patient ambulated in hallway with no shortness of breath. Patient stated she has some weakness, but no other complaints when walking in the hallway.

## 2021-01-13 LAB — GLUCOSE, CAPILLARY
Glucose-Capillary: 102 mg/dL — ABNORMAL HIGH (ref 70–99)
Glucose-Capillary: 121 mg/dL — ABNORMAL HIGH (ref 70–99)
Glucose-Capillary: 130 mg/dL — ABNORMAL HIGH (ref 70–99)
Glucose-Capillary: 82 mg/dL (ref 70–99)

## 2021-01-13 LAB — HEPATIC FUNCTION PANEL
ALT: 19 U/L (ref 0–44)
AST: 24 U/L (ref 15–41)
Albumin: 2.7 g/dL — ABNORMAL LOW (ref 3.5–5.0)
Alkaline Phosphatase: 40 U/L (ref 38–126)
Bilirubin, Direct: 0.2 mg/dL (ref 0.0–0.2)
Indirect Bilirubin: 0.4 mg/dL (ref 0.3–0.9)
Total Bilirubin: 0.6 mg/dL (ref 0.3–1.2)
Total Protein: 6.2 g/dL — ABNORMAL LOW (ref 6.5–8.1)

## 2021-01-13 LAB — C-REACTIVE PROTEIN: CRP: 2.8 mg/dL — ABNORMAL HIGH (ref <1.0)

## 2021-01-13 MED ORDER — DEXAMETHASONE 6 MG PO TABS: 6.0000 mg | ORAL_TABLET | Freq: Every day | ORAL | 0 refills | Status: DC

## 2021-01-13 NOTE — Progress Notes (Signed)
The patient is alert and oriented and has been seen by her physician. The orders for discharge were written. IV has been removed. Went over discharge instructions with patient. She is being discharged via wheelchair with all of her belongings.   

## 2021-01-13 NOTE — Plan of Care (Signed)

## 2021-01-13 NOTE — Discharge Summary (Signed)
Physician Discharge Summary   Patient: Kristen Moreno MRN: [id number] DOB: @DOB   Admit date:     01/09/2021  Discharge date: 01/13/21  Discharge Physician: Edwin Dada   PCP: Jose Persia, MD   Recommendations at discharge: 1. Follow up with PCP in 2 weeks, after Jan 9 2. Call Transplant Nephrology regarding resumption of Prograf and MMF        Discharge Diagnoses Principal Problem:   Acute respiratory failure with hypoxia (HCC) Active Problems:   COVID-19 virus infection     Asthma   Chronic obstructive pulmonary disease (HCC)   ESRD (end stage renal disease) ([place])   S/p cadaver renal transplant   Hypertension   DM (diabetes mellitus) [facility])       Hospital Course   Kristen Moreno is a 60 y.o. F with ESRD s/p DDKD, obesity, CKD IIIa baseline 1.4 OSA not on CPAP, DM, and HTN who presented with fever, chills, cough, and SOB after being diagnosed with COVID 10 days ago.  In the ER, she was tachypneic up to 32 bpm and desaturated to 86% and so was admitted.        * Acute respiratory failure with hypoxia due to COVID-19 virus infection- (present on admission) Had had Legavrio as an outpatient, no imrpovement.  Admitted and started on steroids, remdesivir.  CRP improved, O2 needs weaned.  Malaise and aches resolved.    Patient now mentating at baseline, taking orals.  Temp < 100 F, heart rate < 100bpm, RR < 24, SpO2 at baseline.   Stable for discharge.    Discharged on 4 more days dexamethasone 6 mg.  Prograf dose reduced in the hospital and MMF held.  Recommend patient call to Transplant Nephrology to coordinate resumption of Prograf and MMF.     Chronic obstructive pulmonary disease (HCC)- (present on admission) Asthma- (present on admission) No evidence of flare  S/p cadaver renal transplant ESRD (end stage renal disease) ([name])- (present on admission) Prograf and MMF held at d/c.  Recommended patient to call Nephrlogy on Monday to  coordinate resumption of both.   DM (diabetes mellitus) ([name])  Hypertension- (present on admission)  Obesity BMI 31    Consultants: Nephrology Procedures performed: None  Disposition: Home Diet recommendation: Carb modified diet  DISCHARGE MEDICATION: Allergies as of 01/13/2021       Reactions   Lisinopril Cough        Medication List     STOP taking these medications    azithromycin 250 MG tablet Commonly known as: ZITHROMAX   Lagevrio 200 MG Caps capsule Generic drug: molnupiravir EUA   zolpidem 5 MG tablet Commonly known as: AMBIEN       TAKE these medications    acetaminophen 500 MG tablet Commonly known as: TYLENOL Take 1,500-2,000 mg by mouth daily as needed (pain).   albuterol 108 (90 Base) MCG/ACT inhaler Commonly known as: Proventil HFA Inhale 1-2 puffs into the lungs every 6 (six) hours as needed for wheezing or shortness of breath.   amLODipine 10 MG tablet Commonly known as: NORVASC Take 10 mg by mouth daily.   dexamethasone 6 MG tablet Commonly known as: DECADRON Take 1 tablet (6 mg total) by mouth daily.   diazepam 5 MG tablet Commonly known as: VALIUM Take 5 mg by mouth See admin instructions. Take 5mg  by mouth before epidural and 5 mg after   HYDROcodone-acetaminophen 10-325 MG tablet Commonly known as: NORCO Take 1 tablet by mouth 3 (three) times daily as needed  for moderate pain.   magnesium oxide 400 MG tablet Commonly known as: MAG-OX Take 400 mg by mouth 2 (two) times daily. Take one tablet (400 mg) by mouth twice daily - 1pm and 5pm   mycophenolate 180 MG EC tablet Commonly known as: MYFORTIC Take 540 mg by mouth 2 (two) times daily.   omeprazole 20 MG capsule Commonly known as: PRILOSEC Take 1 capsule by mouth once daily   predniSONE 5 MG tablet Commonly known as: DELTASONE Take 5 mg by mouth daily with breakfast.   sodium bicarbonate 650 MG tablet Take 650 mg by mouth in the morning and at bedtime.    sulfamethoxazole-trimethoprim 400-80 MG tablet Commonly known as: BACTRIM Take 1 tablet by mouth every Monday, Wednesday, and Friday.   tacrolimus 1 MG capsule Commonly known as: PROGRAF Take 7 mg by mouth 2 (two) times daily.        Follow-up Information     Jose Persia, MD. Schedule an appointment as soon as possible for a visit in 2 week(s).   Specialty: Internal Medicine Contact information: [address] [place] [address] [phone]         Kristen Shiley, MD. Schedule an appointment as soon as possible for a visit in 2 week(s).   Specialty: Nephrology Contact information: [place] [address] [phone]                 Discharge Exam: Kristen Moreno Weights   01/09/21 2117 01/10/21 1539  Weight: 88.9 kg 88.9 kg   General appearance: Adult female, lying in bed, no acute distress, interactive, appears tired     HEENT: No nasal discharge or epistaxis.  Oropharynx tacky dry, no oral lesions Skin:  Cardiac: RRR, no murmurs, no lower extremity edema Respiratory: Normal respiratory rate and rhythms, crackles at bilateral bases, fine.  No wheezing. Abdomen:   MSK:   Neuro: Awake and alert, extraocular movements intact, moves upper extremities with generalized weakness but symmetric strength Psych: Attention normal, affect normal, judgment insight appear normal  Condition at discharge: good  The results of significant diagnostics from this hospitalization (including imaging, microbiology, ancillary and laboratory) are listed below for reference.   Imaging Studies: CT Angio Chest PE W and/or Wo Contrast  Result Date: 01/09/2021 CLINICAL DATA:  History of COVID-19 infection 1 week ago with increasing shortness of breath EXAM: CT ANGIOGRAPHY CHEST WITH CONTRAST TECHNIQUE: Multidetector CT imaging of the chest was performed using the standard protocol during bolus administration of intravenous contrast. Multiplanar CT image  reconstructions and MIPs were obtained to evaluate the vascular anatomy. CONTRAST:  84mL OMNIPAQUE IOHEXOL 350 MG/ML SOLN COMPARISON:  Chest x-ray from earlier in the same day ultrasound from 02/22/2020. FINDINGS: Cardiovascular: Atherosclerotic calcifications of the thoracic aorta are noted. No aneurysmal dilatation or dissection is noted. The heart is at the upper limits of normal in size. Coronary calcifications are noted. Pulmonary artery shows a normal branching pattern without evidence of pulmonary emboli. Mediastinum/Nodes: Thoracic inlet is within normal limits. Esophagus as visualized is unremarkable. No sizable hilar or mediastinal adenopathy is noted. Lungs/Pleura: Lungs demonstrate bibasilar airspace opacity similar to that seen on the prior exam. Diffuse emphysematous changes are noted primarily within the upper lobes. No sizable effusion is seen. Upper Abdomen: Visualized upper abdomen demonstrates changes consistent with polycystic kidney disease. This is stable from prior ultrasound examination. Musculoskeletal: Degenerative changes of the thoracic spine are noted. No rib fractures are seen. Review of the MIP images confirms the  above findings. IMPRESSION: No evidence of pulmonary emboli. Bibasilar airspace opacities consistent with atelectasis or early infiltrate. This may be related to the known COVID infection. Changes consistent with polycystic kidney disease. Aortic Atherosclerosis (ICD10-I70.0) and Emphysema (ICD10-J43.9). Electronically Signed   By: Inez Catalina M.D.   On: 01/09/2021 23:06   DG Chest Portable 1 View  Result Date: 01/09/2021 CLINICAL DATA:  Dyspnea. EXAM: PORTABLE CHEST 1 VIEW COMPARISON:  Chest x-ray 10/16/2020 FINDINGS: There is minimal bibasilar atelectasis scratch at there are minimal patchy bibasilar opacities. The costophrenic angles are clear. There is no pneumothorax. Cardiomediastinal silhouette is within normal limits. Surgical clip overlies the left neck.  Radiopaque foreign bodies are seen in the left chest wall, unchanged. IMPRESSION: 1. Minimal bibasilar atelectasis/airspace disease. Electronically Signed   By: Ronney Asters M.D.   On: 01/09/2021 21:54    Microbiology: Results for orders placed or performed during the hospital encounter of 01/09/21  Resp Panel by RT-PCR (Flu A&B, Covid) Nasopharyngeal Swab     Status: Abnormal   Collection Time: 01/09/21  9:54 PM   Specimen: Nasopharyngeal Swab; Nasopharyngeal(NP) swabs in vial transport medium  Result Value Ref Range Status   SARS Coronavirus 2 by RT PCR POSITIVE (A) NEGATIVE Final    Comment: (NOTE) SARS-CoV-2 target nucleic acids are DETECTED.  The SARS-CoV-2 RNA is generally detectable in upper respiratory specimens during the acute phase of infection. Positive results are indicative of the presence of the identified virus, but do not rule out bacterial infection or co-infection with other pathogens not detected by the test. Clinical correlation with patient history and other diagnostic information is necessary to determine patient infection status. The expected result is Negative.  Fact Sheet for Patients: EntrepreneurPulse.com.au  Fact Sheet for Healthcare Providers: IncredibleEmployment.be  This test is not yet approved or cleared by the [place] FDA and  has been authorized for detection and/or diagnosis of SARS-CoV-2 by FDA under an Emergency Use Authorization (EUA).  This EUA will remain in effect (meaning this test can be used) for the duration of  the COVID-19 declaration under Section 564(b)(1) of the A ct, 21 U.S.C. section 360bbb-3(b)(1), unless the authorization is terminated or revoked sooner.     Influenza A by PCR NEGATIVE NEGATIVE Final   Influenza B by PCR NEGATIVE NEGATIVE Final    Comment: (NOTE) The Xpert Xpress SARS-CoV-2/FLU/RSV plus assay is intended as an aid in the diagnosis of influenza from Nasopharyngeal  swab specimens and should not be used as a sole basis for treatment. Nasal washings and aspirates are unacceptable for Xpert Xpress SARS-CoV-2/FLU/RSV testing.  Fact Sheet for Patients: EntrepreneurPulse.com.au  Fact Sheet for Healthcare Providers: IncredibleEmployment.be  This test is not yet approved or cleared by the [place] FDA and has been authorized for detection and/or diagnosis of SARS-CoV-2 by FDA under an Emergency Use Authorization (EUA). This EUA will remain in effect (meaning this test can be used) for the duration of the COVID-19 declaration under Section 564(b)(1) of the Act, 21 U.S.C. section 360bbb-3(b)(1), unless the authorization is terminated or revoked.  Performed at [facility], [place] [address], [place], [place] [address]     Labs: CBC: Recent Labs  Lab 01/09/21 2139 01/10/21 0455  WBC 4.1 4.7  NEUTROABS 3.4 4.3  HGB 14.3 13.2  HCT 44.2 40.8  MCV 91.9 91.9  PLT 233 130   Basic Metabolic Panel: Recent Labs  Lab 01/09/21 2139 01/10/21 0454 01/11/21 0401 01/12/21 0328  NA 139 137 139 141  K 3.9 4.0 4.1 4.3  CL 109 107 111 112*  CO2 21* 20* 18* 20*  GLUCOSE 105* 178* 166* 162*  BUN 18 18 23* 32*  CREATININE 1.10* 1.07* 1.03* 1.09*  CALCIUM 8.7* 8.4* 8.6* 8.6*  MG  --  1.7  --   --   PHOS  --  2.7  --   --    Liver Function Tests: Recent Labs  Lab 01/09/21 2139 01/10/21 0454 01/11/21 0401 01/13/21 0321  AST 23 20 16 24   ALT 18 17 15 19   ALKPHOS 49 48 41 40  BILITOT 0.4 0.5 0.4 0.6  PROT 7.5 7.1 6.4* 6.2*  ALBUMIN 3.5 3.3* 2.9* 2.7*   CBG: Recent Labs  Lab 01/12/21 2017 01/13/21 0000 01/13/21 0320 01/13/21 0814 01/13/21 1221  GLUCAP 208* 121* 102* 82 130*    Discharge time spent: greater than 30 minutes.  Signed:  Edwin Dada MD.  [facility] 01/13/2021

## 2021-01-14 ENCOUNTER — Other Ambulatory Visit: Payer: Self-pay | Admitting: Internal Medicine

## 2021-01-16 NOTE — Telephone Encounter (Signed)
Dr. Thedore Mins has been prescribing patient's Norco since March 2022. Recommend patient reach out to his office for further instructions regarding Norco as we have no evaluated her for this specifically.

## 2021-01-16 NOTE — Telephone Encounter (Signed)
Next Office visit 01/23/2021.  No ToxAssure  done.

## 2021-01-22 ENCOUNTER — Other Ambulatory Visit: Payer: Self-pay | Admitting: Neurological Surgery

## 2021-01-23 ENCOUNTER — Other Ambulatory Visit: Payer: Self-pay

## 2021-01-23 ENCOUNTER — Ambulatory Visit (INDEPENDENT_AMBULATORY_CARE_PROVIDER_SITE_OTHER): Payer: Medicare Other | Admitting: Internal Medicine

## 2021-01-23 ENCOUNTER — Encounter: Payer: Self-pay | Admitting: Internal Medicine

## 2021-01-23 VITALS — BP 126/76 | HR 91 | Temp 98.4°F | Resp 24 | Ht 66.0 in | Wt 190.4 lb

## 2021-01-23 DIAGNOSIS — E119 Type 2 diabetes mellitus without complications: Secondary | ICD-10-CM | POA: Diagnosis present

## 2021-01-23 DIAGNOSIS — J9601 Acute respiratory failure with hypoxia: Secondary | ICD-10-CM

## 2021-01-23 DIAGNOSIS — I10 Essential (primary) hypertension: Secondary | ICD-10-CM

## 2021-01-23 DIAGNOSIS — R4589 Other symptoms and signs involving emotional state: Secondary | ICD-10-CM

## 2021-01-23 DIAGNOSIS — F32A Depression, unspecified: Secondary | ICD-10-CM | POA: Diagnosis not present

## 2021-01-23 LAB — GLUCOSE, CAPILLARY: Glucose-Capillary: 162 mg/dL — ABNORMAL HIGH (ref 70–99)

## 2021-01-23 LAB — POCT GLYCOSYLATED HEMOGLOBIN (HGB A1C): Hemoglobin A1C: 7.6 % — AB (ref 4.0–5.6)

## 2021-01-23 MED ORDER — SERTRALINE HCL 25 MG PO TABS: 25.0000 mg | ORAL_TABLET | Freq: Every day | ORAL | 2 refills | Status: DC
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
## 2021-01-23 NOTE — Patient Instructions (Signed)
Dear Kristen Moreno,  Thank you for trusting [place] with your care today. Today we evaluated you for hospital follow up. You are making progress with your recovery from [place].  We also addressed feeling down and depressed due to significant life stressors. I have placed a referral to behavioral health to reach out to you. I have also sent in a prescription for sertraline 25mg  to take.   We would like to see you back in approximately 1 month for a follow up.

## 2021-01-24 LAB — CMP14 + ANION GAP
ALT: 24 IU/L (ref 0–32)
AST: 14 IU/L (ref 0–40)
Albumin/Globulin Ratio: 1.2 (ref 1.2–2.2)
Albumin: 3.6 g/dL — ABNORMAL LOW (ref 3.8–4.9)
Alkaline Phosphatase: 78 IU/L (ref 44–121)
Anion Gap: 16 mmol/L (ref 10.0–18.0)
BUN/Creatinine Ratio: 15 (ref 9–23)
BUN: 16 mg/dL (ref 6–24)
Bilirubin Total: <0.2 mg/dL (ref 0.0–1.2)
CO2: 16 mmol/L — ABNORMAL LOW (ref 20–29)
Calcium: 8.6 mg/dL — ABNORMAL LOW (ref 8.7–10.2)
Chloride: 107 mmol/L — ABNORMAL HIGH (ref 96–106)
Creatinine, Ser: 1.08 mg/dL — ABNORMAL HIGH (ref 0.57–1.00)
Globulin, Total: 3.1 g/dL (ref 1.5–4.5)
Glucose: 151 mg/dL — ABNORMAL HIGH (ref 70–99)
Potassium: 4.1 mmol/L (ref 3.5–5.2)
Sodium: 139 mmol/L (ref 134–144)
Total Protein: 6.7 g/dL (ref 6.0–8.5)
eGFR: 60 mL/min/{1.73_m2} (ref >59)

## 2021-01-26 DIAGNOSIS — F32A Depression, unspecified: Secondary | ICD-10-CM | POA: Insufficient documentation

## 2021-01-26 NOTE — Assessment & Plan Note (Signed)
Still mildly SOB but reports less dyspnea with exertion. Producing clear phlegm. No fever or chest pain.   Continues to make improvement after discharge. Continue home medications.

## 2021-01-26 NOTE — Assessment & Plan Note (Addendum)
Patient reports several recent significant life events and stressors including the death of her mother and brother. Stress compounded by recent hospitalization.  She endorses feeling down and depressed, overwhelmed. Trouble sleeping with decreased PO intake. Denies suicidal ideation or thoughts of wanting to hurt herself. Referral to Dr. Theodis Shove placed. Started pt on Sertraline 25mg . Plan to follow up in 4 weeks

## 2021-01-27 ENCOUNTER — Encounter: Payer: Self-pay | Admitting: Internal Medicine

## 2021-01-27 NOTE — Assessment & Plan Note (Signed)
A1c7.6, elevated from 6 months ago. Pt on chronic prednisone with recent dexamethasone for COVID.   This will need to be addressed at f/u visit in 1 week.

## 2021-01-31 ENCOUNTER — Other Ambulatory Visit: Payer: Self-pay | Admitting: Internal Medicine

## 2021-01-31 ENCOUNTER — Institutional Professional Consult (permissible substitution): Payer: BC Managed Care – PPO | Admitting: Behavioral Health

## 2021-01-31 MED ORDER — SODIUM BICARBONATE 650 MG PO TABS: 650.0000 mg | ORAL_TABLET | Freq: Three times a day (TID) | ORAL | 1 refills | Status: AC

## 2021-01-31 NOTE — Progress Notes (Signed)
Routine lab work revealed a bicarb of 16. She will be started on sodium bicarb 650mg  TID with recommendation to recheck labs in 3 weeks. Attempted to call patient twice to update her on new medication and change in management plan, but no answer. Left HIPAA compliant message. Will continue to attempt to contact her.

## 2021-02-08 ENCOUNTER — Other Ambulatory Visit: Payer: Self-pay | Admitting: Internal Medicine

## 2021-02-08 ENCOUNTER — Inpatient Hospital Stay: Admit: 2021-02-08 | Payer: BC Managed Care – PPO | Admitting: Neurological Surgery

## 2021-02-08 DIAGNOSIS — K2270 Barrett's esophagus without dysplasia: Secondary | ICD-10-CM

## 2021-02-08 SURGERY — MINIMALLY INVASIVE (MIS) TRANSFORAMINAL LUMBAR INTERBODY FUSION (TLIF) 1 LEVEL
Anesthesia: General | Laterality: Right

## 2021-02-25 ENCOUNTER — Ambulatory Visit: Payer: Medicare Other | Admitting: Behavioral Health

## 2021-02-25 DIAGNOSIS — F331 Major depressive disorder, recurrent, moderate: Secondary | ICD-10-CM

## 2021-02-25 DIAGNOSIS — F419 Anxiety disorder, unspecified: Secondary | ICD-10-CM
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
## 2021-03-13 ENCOUNTER — Other Ambulatory Visit: Payer: Self-pay | Admitting: Neurological Surgery

## 2021-03-18 ENCOUNTER — Ambulatory Visit: Payer: Medicare Other | Admitting: Behavioral Health

## 2021-03-18 DIAGNOSIS — F331 Major depressive disorder, recurrent, moderate: Secondary | ICD-10-CM

## 2021-03-18 DIAGNOSIS — F419 Anxiety disorder, unspecified: Secondary | ICD-10-CM
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
## 2021-03-20 ENCOUNTER — Other Ambulatory Visit: Payer: Self-pay | Admitting: Neurological Surgery

## 2021-04-02 ENCOUNTER — Encounter (HOSPITAL_COMMUNITY): Payer: Self-pay

## 2021-04-04 ENCOUNTER — Emergency Department (HOSPITAL_COMMUNITY)
Admission: EM | Admit: 2021-04-04 | Discharge: 2021-04-04 | Disposition: A | Discharge status: Home / Self Care | Payer: BC Managed Care – PPO | Attending: Emergency Medicine | Admitting: Emergency Medicine

## 2021-04-04 ENCOUNTER — Other Ambulatory Visit: Payer: Self-pay

## 2021-04-04 ENCOUNTER — Emergency Department (HOSPITAL_COMMUNITY): Payer: BC Managed Care – PPO

## 2021-04-04 ENCOUNTER — Telehealth: Payer: Self-pay | Admitting: Internal Medicine

## 2021-04-04 DIAGNOSIS — M25522 Pain in left elbow: Secondary | ICD-10-CM | POA: Insufficient documentation

## 2021-04-04 DIAGNOSIS — R233 Spontaneous ecchymoses: Secondary | ICD-10-CM | POA: Diagnosis not present

## 2021-04-04 DIAGNOSIS — Z79899 Other long term (current) drug therapy: Secondary | ICD-10-CM | POA: Diagnosis not present

## 2021-04-04 LAB — COMPREHENSIVE METABOLIC PANEL
ALT: 15 U/L (ref 0–44)
AST: 17 U/L (ref 15–41)
Albumin: 3.6 g/dL (ref 3.5–5.0)
Alkaline Phosphatase: 53 U/L (ref 38–126)
Anion gap: 12 (ref 5–15)
BUN: 26 mg/dL — ABNORMAL HIGH (ref 6–20)
CO2: 23 mmol/L (ref 22–32)
Calcium: 9.2 mg/dL (ref 8.9–10.3)
Chloride: 107 mmol/L (ref 98–111)
Creatinine, Ser: 1.14 mg/dL — ABNORMAL HIGH (ref 0.44–1.00)
GFR, Estimated: 56 mL/min — ABNORMAL LOW (ref >60)
Glucose, Bld: 109 mg/dL — ABNORMAL HIGH (ref 70–99)
Potassium: 4.4 mmol/L (ref 3.5–5.1)
Sodium: 142 mmol/L (ref 135–145)
Total Bilirubin: 0.3 mg/dL (ref 0.3–1.2)
Total Protein: 6.9 g/dL (ref 6.5–8.1)

## 2021-04-04 LAB — CBC
HCT: 40.1 % (ref 36.0–46.0)
Hemoglobin: 12.3 g/dL (ref 12.0–15.0)
MCH: 29.5 pg (ref 26.0–34.0)
MCHC: 30.7 g/dL (ref 30.0–36.0)
MCV: 96.2 fL (ref 80.0–100.0)
Platelets: 232 10*3/uL (ref 150–400)
RBC: 4.17 MIL/uL (ref 3.87–5.11)
RDW: 14.6 % (ref 11.5–15.5)
WBC: 7.7 10*3/uL (ref 4.0–10.5)
nRBC: 0 % (ref 0.0–0.2)

## 2021-04-04 MED ORDER — LIDOCAINE 5 % EX PTCH
1.0000 | MEDICATED_PATCH | CUTANEOUS | Status: DC
  Administered 2021-04-04: 1 via TRANSDERMAL
  Filled 2021-04-04: qty 1

## 2021-04-04 MED ORDER — ACETAMINOPHEN 325 MG PO TABS
650.0000 mg | ORAL_TABLET | Freq: Once | ORAL | Status: AC
  Administered 2021-04-04: 650 mg via ORAL
  Filled 2021-04-04: qty 2

## 2021-04-04 NOTE — ED Triage Notes (Signed)
Pt here from home with complaints of swelling to R elbow that she woke up with yesterday morning. No falls or trauma to the area. Pt states there is some bruising to the area, feels like it is throbbing. Pt does have a dialysis graft on that side but it has not been in use since kidney transplant. Pt denies fever at home.  ?

## 2021-04-04 NOTE — Discharge Instructions (Signed)
You were evaluated in the Emergency Department and after careful evaluation, we did not find any emergent condition requiring admission or further testing in the hospital.  Your exam/testing today was overall reassuring.  Please return to the Emergency Department if you experience any worsening of your condition.  Thank you for allowing us to be a part of your care.  

## 2021-04-04 NOTE — ED Notes (Signed)
Pt care taken, went to the bathroom, gave pain medication, no other complaints at this time. ?

## 2021-04-04 NOTE — Telephone Encounter (Signed)
Ms. Kristen Moreno contacted our clinic this afternoon with some concerns.  She states that for 2-3 days now, she has been experiencing generalized malaise with fevers and chills.  Yesterday morning when she woke up, she noticed that her left elbow was markedly swollen.  She could feel fluctuance concerning for underlying fluid.  Her elbow is very tender to touch.  She notes that her graft appears nonswollen, nonerythematous without tenderness. ? ?Overall, patient symptoms are concerning.  Given ongoing immunosuppression and type 2 diabetes, patient is at risk for septic joint.  On the other hand, this could be consistent with acute gout. ? ?I discussed with [name] that she requires immediate in person evaluation with possible imaging and/or joint aspiration.  I recommended she go to the nearest ER as soon as possible.  She expressed understanding and will go today. ?
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
## 2021-04-08 NOTE — Pre-Procedure Instructions (Signed)
Surgical Instructions ? ? ? Your procedure is scheduled on Thursday, March 23. ? Report to [facility] Main Entrance "A" at 5:30 A.M., then check in with the Admitting office. ? Call this number if you have problems the morning of surgery: ? [phone] ? ? If you have any questions prior to your surgery date call [phone]: Open Monday-Friday 8am-4pm ? ? ? Remember: ? Do not eat or drink after midnight the night before your surgery ?  ? Take these medicines the morning of surgery with A SIP OF WATER:  ? ?amLODipine (NORVASC)  ?sertraline (ZOLOFT) ?tacrolimus (PROGRAF) ?mycophenolate (MYFORTIC)  ?dexamethasone (DECADRON) ?predniSONE (DELTASONE)  ?omeprazole (PRILOSEC)  ? ?acetaminophen (TYLENOL)  if needed ?HYDROcodone-acetaminophen [facility]) if needed ? ?As of today, STOP taking any Aspirin (unless otherwise instructed by your surgeon) Aleve, Naproxen, Ibuprofen, Motrin, Advil, Goody's, BC's, all herbal medications, fish oil, and all vitamins. ? ?WHAT DO I DO ABOUT MY DIABETES MEDICATION? ? ? ?Do not take oral diabetes medicines (pills) the morning of surgery. ? ?THE NIGHT BEFORE SURGERY, take ___________ units of ___________insulin.     ? ? ?THE MORNING OF SURGERY, take _____________ units of __________insulin. ? ?The day of surgery, do not take other diabetes injectables, including Byetta (exenatide), Bydureon (exenatide ER), Victoza (liraglutide), or Trulicity (dulaglutide). ? ?If your CBG is greater than 220 mg/dL, you may take ? of your sliding scale (correction) dose of insulin. ? ? ?HOW TO MANAGE YOUR DIABETES ?BEFORE AND AFTER SURGERY ? ?Why is it important to control my blood sugar before and after surgery? ?Improving blood sugar levels before and after surgery helps healing and can limit problems. ?A way of improving blood sugar control is eating a healthy diet by: ? Eating less sugar and carbohydrates ? Increasing activity/exercise ? Talking with your doctor about reaching your blood sugar goals ?High  blood sugars (greater than 180 mg/dL) can raise your risk of infections and slow your recovery, so you will need to focus on controlling your diabetes during the weeks before surgery. ?Make sure that the doctor who takes care of your diabetes knows about your planned surgery including the date and location. ? ?How do I manage my blood sugar before surgery? ?Check your blood sugar at least 4 times a day, starting 2 days before surgery, to make sure that the level is not too high or low. ? ?Check your blood sugar the morning of your surgery when you wake up and every 2 hours until you get to the Short Stay unit. ? ?If your blood sugar is less than 70 mg/dL, you will need to treat for low blood sugar: ?Do not take insulin. ?Treat a low blood sugar (less than 70 mg/dL) with ? cup of clear juice (cranberry or apple), 4 glucose tablets, OR glucose gel. ?Recheck blood sugar in 15 minutes after treatment (to make sure it is greater than 70 mg/dL). If your blood sugar is not greater than 70 mg/dL on recheck, call [phone] for further instructions. ?Report your blood sugar to the short stay nurse when you get to Short Stay. ? ?If you are admitted to the hospital after surgery: ?Your blood sugar will be checked by the staff and you will probably be given insulin after surgery (instead of oral diabetes medicines) to make sure you have good blood sugar levels. ?The goal for blood sugar control after surgery is 80-180 mg/dL. ? ? ?         ?Do not wear jewelry or makeup ?Do not  wear lotions, powders, perfumes/colognes, or deodorant. ?Do not shave 48 hours prior to surgery.  Men may shave face and neck. ?Do not bring valuables to the hospital. ?Do not wear nail polish, gel polish, artificial nails, or any other type of covering on natural nails (fingers and toes) ?If you have artificial nails or gel coating that need to be removed by a nail salon, please have this removed prior to surgery. Artificial nails or gel coating may  interfere with anesthesia's ability to adequately monitor your vital signs. ? ?Section is not responsible for any belongings or valuables. .  ? ?Do NOT Smoke (Tobacco/Vaping)  24 hours prior to your procedure ? ?If you use a CPAP at night, you may bring your mask for your overnight stay. ?  ?Contacts, glasses, hearing aids, dentures or partials may not be worn into surgery, please bring cases for these belongings ?  ?For patients admitted to the hospital, discharge time will be determined by your treatment team. ?  ?Patients discharged the day of surgery will not be allowed to drive home, and someone needs to stay with them for 24 hours. ? ?NO VISITORS WILL BE ALLOWED IN PRE-OP WHERE PATIENTS ARE PREPPED FOR SURGERY.  ONLY 1 SUPPORT PERSON MAY BE PRESENT IN THE WAITING ROOM WHILE YOU ARE IN SURGERY.  IF YOU ARE TO BE ADMITTED, ONCE YOU ARE IN YOUR ROOM YOU WILL BE ALLOWED TWO (2) VISITORS. 1 (ONE) VISITOR MAY STAY OVERNIGHT BUT MUST ARRIVE TO THE ROOM BY 8pm.  Minor children may have two parents present. Special consideration for safety and communication needs will be reviewed on a case by case basis. ? ?Special instructions:   ? ?Oral Hygiene is also important to reduce your risk of infection.  Remember - BRUSH YOUR TEETH THE MORNING OF SURGERY WITH YOUR REGULAR TOOTHPASTE ? ? ?[place]- Preparing For Surgery ? ?Before surgery, you can play an important role. Because skin is not sterile, your skin needs to be as free of germs as possible. You can reduce the number of germs on your skin by washing with CHG (chlorahexidine gluconate) Soap before surgery.  CHG is an antiseptic cleaner which kills germs and bonds with the skin to continue killing germs even after washing.   ? ? ?Please do not use if you have an allergy to CHG or antibacterial soaps. If your skin becomes reddened/irritated stop using the CHG.  ?Do not shave (including legs and underarms) for at least 48 hours prior to first CHG shower. It is OK  to shave your face. ? ?Please follow these instructions carefully. ?  ? ? Shower the NIGHT BEFORE SURGERY and the MORNING OF SURGERY with CHG Soap.  ? If you chose to wash your hair, wash your hair first as usual with your normal shampoo. After you shampoo, rinse your hair and body thoroughly to remove the shampoo.  Then [employer] and genitals (private parts) with your normal soap and rinse thoroughly to remove soap. ? ?After that Use CHG Soap as you would any other liquid soap. You can apply CHG directly to the skin and wash gently with a scrungie or a clean washcloth.  ? ?Apply the CHG Soap to your body ONLY FROM THE NECK DOWN.  Do not use on open wounds or open sores. Avoid contact with your eyes, ears, mouth and genitals (private parts). Wash Face and genitals (private parts)  with your normal soap.  ? ?Wash thoroughly, paying special attention to the area  where your surgery will be performed. ? ?Thoroughly rinse your body with warm water from the neck down. ? ?DO NOT shower/wash with your normal soap after using and rinsing off the CHG Soap. ? ?Pat yourself dry with a CLEAN TOWEL. ? ?Wear CLEAN PAJAMAS to bed the night before surgery ? ?Place CLEAN SHEETS on your bed the night before your surgery ? ?DO NOT SLEEP WITH PETS. ? ? ?Day of Surgery: ? ?Take a shower with CHG soap. ?Wear Clean/Comfortable clothing the morning of surgery ?Do not apply any deodorants/lotions.   ?Remember to brush your teeth WITH YOUR REGULAR TOOTHPASTE. ? ? ? ?Notify your provider: ?if you are in close contact with someone who has COVID  ?or if you develop a fever of 100.4 or greater, sneezing, cough, sore throat, shortness of breath or body aches. ? ?  ?Please read over the following fact sheets that you were given.  ? ?

## 2021-04-09 ENCOUNTER — Encounter (HOSPITAL_COMMUNITY): Payer: Self-pay

## 2021-04-09 ENCOUNTER — Encounter (HOSPITAL_COMMUNITY)
Admission: RE | Admit: 2021-04-09 | Discharge: 2021-04-09 | Disposition: A | Discharge status: Home / Self Care | Payer: BC Managed Care – PPO | Source: Ambulatory Visit | Attending: Neurological Surgery | Admitting: Neurological Surgery

## 2021-04-09 ENCOUNTER — Other Ambulatory Visit: Payer: Self-pay

## 2021-04-09 VITALS — BP 137/81 | HR 83 | Temp 98.2°F | Resp 18 | Ht 66.0 in | Wt 196.3 lb

## 2021-04-09 DIAGNOSIS — N186 End stage renal disease: Secondary | ICD-10-CM | POA: Diagnosis not present

## 2021-04-09 DIAGNOSIS — Z01812 Encounter for preprocedural laboratory examination: Secondary | ICD-10-CM | POA: Diagnosis present

## 2021-04-09 DIAGNOSIS — Z01818 Encounter for other preprocedural examination: Secondary | ICD-10-CM

## 2021-04-09 LAB — TYPE AND SCREEN
ABO/RH(D): A POS
Antibody Screen: NEGATIVE

## 2021-04-09 LAB — GLUCOSE, CAPILLARY: Glucose-Capillary: 125 mg/dL — ABNORMAL HIGH (ref 70–99)

## 2021-04-09 LAB — SURGICAL PCR SCREEN
MRSA, PCR: NEGATIVE
Staphylococcus aureus: NEGATIVE

## 2021-04-09 NOTE — Pre-Procedure Instructions (Signed)
Surgical Instructions ? ? ? Your procedure is scheduled on Thursday, March 23. ? Report to [facility] Main Entrance "A" at 5:30 A.M., then check in with the Admitting office. ? Call this number if you have problems the morning of surgery: ? [phone] ? ? If you have any questions prior to your surgery date call [phone]: Open Monday-Friday 8am-4pm ? ? ? Remember: ? Do not eat or drink after midnight the night before your surgery ?  ? Take these medicines the morning of surgery with A SIP OF WATER:  ? ?amLODipine (NORVASC)  ?sertraline (ZOLOFT) ?tacrolimus (PROGRAF) ?mycophenolate (MYFORTIC)  ?predniSONE (DELTASONE)  ?omeprazole (PRILOSEC)  ?Albuterol inhaler-please bring inhaler with you to the hospital. ?acetaminophen (TYLENOL)  if needed ? ? ?As of today, STOP taking any Aspirin (unless otherwise instructed by your surgeon) Aleve, Naproxen, Ibuprofen, Motrin, Advil, Goody's, BC's, all herbal medications, fish oil, and all vitamins. ? ? ?HOW TO MANAGE YOUR DIABETES ?BEFORE AND AFTER SURGERY ? ?Why is it important to control my blood sugar before and after surgery? ?Improving blood sugar levels before and after surgery helps healing and can limit problems. ?A way of improving blood sugar control is eating a healthy diet by: ? Eating less sugar and carbohydrates ? Increasing activity/exercise ? Talking with your doctor about reaching your blood sugar goals ?High blood sugars (greater than 180 mg/dL) can raise your risk of infections and slow your recovery, so you will need to focus on controlling your diabetes during the weeks before surgery. ?Make sure that the doctor who takes care of your diabetes knows about your planned surgery including the date and location. ? ?How do I manage my blood sugar before surgery? ?Check your blood sugar at least 4 times a day, starting 2 days before surgery, to make sure that the level is not too high or low. ? ?Check your blood sugar the morning of your surgery when you wake  up and every 2 hours until you get to the Short Stay unit. ? ?If your blood sugar is less than 70 mg/dL, you will need to treat for low blood sugar: ?Do not take insulin. ?Treat a low blood sugar (less than 70 mg/dL) with ? cup of clear juice (cranberry or apple), 4 glucose tablets, OR glucose gel. ?Recheck blood sugar in 15 minutes after treatment (to make sure it is greater than 70 mg/dL). If your blood sugar is not greater than 70 mg/dL on recheck, call [phone] for further instructions. ?Report your blood sugar to the short stay nurse when you get to Short Stay. ? ?If you are admitted to the hospital after surgery: ?Your blood sugar will be checked by the staff and you will probably be given insulin after surgery (instead of oral diabetes medicines) to make sure you have good blood sugar levels. ?The goal for blood sugar control after surgery is 80-180 mg/dL. ? ?SURGICAL WAITING ROOM VISITATION ?Patients having surgery or a procedure in a hospital may have two support people. ?Children under the age of 11 must have an adult with them who is not the patient. ?They may stay in the waiting area during the procedure and may switch out with other visitors. If the patient needs to stay at the hospital during part of their recovery, the visitor guidelines for inpatient rooms apply. ? ?Please refer to the [facility] website for the visitor guidelines for Inpatients (after your surgery is over and you are in a regular room).  ? ?         ?  Do not wear jewelry or makeup ?Do not wear lotions, powders, perfumes, or deodorant. ?Do not shave 48 hours prior to surgery.  ?Do not bring valuables to the hospital. ?Do not wear nail polish, gel polish, artificial nails, or any other type of covering on natural nails (fingers and toes) ?If you have artificial nails or gel coating that need to be removed by a nail salon, please have this removed prior to surgery. Artificial nails or gel coating may interfere with anesthesia's  ability to adequately monitor your vital signs. ? ?[facility] is not responsible for any belongings or valuables. .  ? ?Do NOT Smoke (Tobacco/Vaping)  24 hours prior to your procedure ? ?If you use a CPAP at night, you may bring your mask for your overnight stay. ?  ?Contacts, glasses, hearing aids, dentures or partials may not be worn into surgery, please bring cases for these belongings ?  ?For patients admitted to the hospital, discharge time will be determined by your treatment team. ?  ?Patients discharged the day of surgery will not be allowed to drive home, and someone needs to stay with them for 24 hours. ? ? ? ?Special instructions:   ? ?Oral Hygiene is also important to reduce your risk of infection.  Remember - BRUSH YOUR TEETH THE MORNING OF SURGERY WITH YOUR REGULAR TOOTHPASTE ? ? ?- Preparing For Surgery ? ?Before surgery, you can play an important role. Because skin is not sterile, your skin needs to be as free of germs as possible. You can reduce the number of germs on your skin by washing with CHG (chlorahexidine gluconate) Soap before surgery.  CHG is an antiseptic cleaner which kills germs and bonds with the skin to continue killing germs even after washing.   ? ? ?Please do not use if you have an allergy to CHG or antibacterial soaps. If your skin becomes reddened/irritated stop using the CHG.  ?Do not shave (including legs and underarms) for at least 48 hours prior to first CHG shower. It is OK to shave your face. ? ?Please follow these instructions carefully. ?  ? ? Shower the NIGHT BEFORE SURGERY and the MORNING OF SURGERY with CHG Soap.  ? If you chose to wash your hair, wash your hair first as usual with your normal shampoo. After you shampoo, rinse your hair and body thoroughly to remove the shampoo.  Then [employer] and genitals (private parts) with your normal soap and rinse thoroughly to remove soap. ? ?After that Use CHG Soap as you would any other liquid soap. You can apply  CHG directly to the skin and wash gently with a scrungie or a clean washcloth.  ? ?Apply the CHG Soap to your body ONLY FROM THE NECK DOWN.  Do not use on open wounds or open sores. Avoid contact with your eyes, ears, mouth and genitals (private parts). Wash Face and genitals (private parts)  with your normal soap.  ? ?Wash thoroughly, paying special attention to the area where your surgery will be performed. ? ?Thoroughly rinse your body with warm water from the neck down. ? ?DO NOT shower/wash with your normal soap after using and rinsing off the CHG Soap. ? ?Pat yourself dry with a CLEAN TOWEL. ? ?Wear CLEAN PAJAMAS to bed the night before surgery ? ?Place CLEAN SHEETS on your bed the night before your surgery ? ?DO NOT SLEEP WITH PETS. ? ? ?Day of Surgery: ? ?Take a shower with CHG soap. ?Wear Clean/Comfortable clothing  the morning of surgery ?Do not apply any deodorants/lotions.   ?Remember to brush your teeth WITH YOUR REGULAR TOOTHPASTE. ? ? ?Please read over the following fact sheets that you were given.  ? ?If you received a COVID test during your pre-op visit  it is requested that you wear a mask when out in public, stay away from anyone that may not be feeling well and notify your surgeon if you develop symptoms. If you have been in contact with anyone that has tested positive in the last 10 days please notify you surgeon. ? ? ?

## 2021-04-09 NOTE — Progress Notes (Signed)
PCP - Jose Persia, MD ?Cardiologist - denies ? ?PPM/ICD - n/a ? ?Chest x-ray - 01/09/21 ?EKG - 01/15/21 ?Stress Test - 01/11/20 ?ECHO - 12/26/19 ?Cardiac Cath - denies ? ?Sleep Study - OSA+ ?CPAP - denies use. Has CPAP but has not used it in years.  ? ?Fasting Blood Sugar - 90-125 ?Checks Blood Sugar 1-2 times a week ?Last A1C on 03/20/21 was 7.1.  ? ?Blood Thinner Instructions: n/a ?Aspirin Instructions: n/a ? ?NPO at MD ? ?COVID TEST- N/A ? ?Anesthesia review: Yes, hospitalization within the last 6 months for respiratory failure. Pt was diagnosed with covid-19.   ? ?Patient denies shortness of breath, fever, cough and chest pain at PAT appointment ? ? ?All instructions explained to the patient, with a verbal understanding of the material. Patient agrees to go over the instructions while at home for a better understanding. Patient also instructed to self quarantine after being tested for COVID-19. The opportunity to ask questions was provided. ? ? ?

## 2021-04-10 NOTE — Progress Notes (Signed)
Anesthesia Chart Review: ? 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  Followed by transplant medicine at [place] seen 03/20/2021 and noted to have excellent allograft function, creatinine 1.21.  Discussed upcoming lumbar fusion.  No changes made to management.  Advised to follow-up in 1 year. ? 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  She was seen by ID and completed 9 months of isoniazid and B6. ? ?Patient was admitted at [facility] in December 2022 for acute respiratory failure with hypoxia secondary to COVID infection.  She was treated with steroids and remdesivir.  Initially required O2 but this was weaned during admission and was not required at discharge.  She was seen in outpatient follow-up by her PCP 01/23/2021 and still having some mild shortness of breath, but noted to be continuing to improve post discharge. ? ?Patient denied shortness of breath, fever, cough, chest pain at prevention testing appointment. ? ?History of OSA, intolerant to CPAP. ? ?Non-insulin-dependent DM2, last A1c 7.6 on 01/23/2021. ? ?Preop labs reviewed, unremarkable. ? ?EKG 01/10/2021 (in the setting of admission for ARF secondary to [place]): Sinus tachycardia.  Rate 103.  Probable LAE.  Anterior infarct, old. ? ?EKG 12/30/2019: Sinus rhythm with short PR.  Rate 79.  Possible LAE.  Low voltage QRS.  Cannot rule out anterior infarct, age undetermined. ? ?Event monitor 01/23/2020: ?7 days of data recorded on Zio monitor. Patient had a min HR of 56 bpm, max HR of 143 bpm, and avg HR of 91 bpm. Predominant underlying rhythm was Sinus Rhythm. No VT, SVT, atrial fibrillation, high degree block, or pauses noted. Isolated atrial and ventricular ectopy was rare (<1%). There were 0 triggered events. No significant arrhythmias detected. ?  ?Nuclear stress test 01/11/2020: ?Nuclear stress EF: 72%. ?The left ventricular ejection fraction is hyperdynamic  (>65%). ?There was no ST segment deviation noted during stress. ?The study is normal. ?This is a low risk study. ?  ?Normal resting and stress perfusion. No ischemia or infarction EF ?72% ?  ?TTE 12/26/2019: ? 1. Left ventricular ejection fraction, by estimation, is 60 to 65%. The  ?left ventricle has normal function. The left ventricle has no regional  ?wall motion abnormalities. There is mild concentric left ventricular  ?hypertrophy. Left ventricular diastolic  ?parameters are consistent with Grade II diastolic dysfunction  ?(pseudonormalization). Elevated left ventricular end-diastolic pressure.  ? 2. Right ventricular systolic function is normal. The right ventricular  ?size is normal. There is normal pulmonary artery systolic pressure.  ? 3. Left atrial size was mildly dilated.  ? 4. A small pericardial effusion is present. The pericardial effusion is  ?circumferential.  ? 5. The mitral valve is normal in structure. Trivial mitral valve  ?regurgitation. No evidence of mitral stenosis.  ? 6. The aortic valve is grossly normal. Aortic valve regurgitation is not  ?visualized. No aortic stenosis is present.  ? 7. The inferior vena cava is normal in size with greater than 50%  ?respiratory variability, suggesting right atrial pressure of 3 mmHg.  ? 8. Cannot exclude intra-atrial shunt.  ? ?Exercise stress echo 01/07/2019 (Care Everywhere): ?SUMMARY  ?The patient had no chest pain.  ?The patient achieved 94 % of maximum predicted heart rate.  ?The METS achieved was 7.  ?Exercise capacity was fair.  ?Normal left ventricular function at rest. The estimated LV ejection  ?fraction is 60-65% .  ?Normal left ventricular function and global wall  motion with stress.  ?The estimated LV ejection fraction is >70% with stress.  ?Negative exercise echocardiography for inducible ischemia at target  ?heart rate.  ?Negative stress ECG for inducible ischemia at target heart rate.  ? ? ? ?Karoline Caldwell, PA-C ?[facility] Short Stay  Center/Anesthesiology ?Phone [phone] ?04/10/2021 9:03 AM ? ?

## 2021-04-10 NOTE — Anesthesia Preprocedure Evaluation (Addendum)
Anesthesia Evaluation  ?Patient identified by MRN, date of birth, ID band ?Patient awake ? ? ? ?Reviewed: ?Allergy & Precautions, NPO status , Patient's Chart, lab work & pertinent test results ? ?Airway ?Mallampati: II ? ?TM Distance: >3 FB ?Neck ROM: Full ? ? ? Dental ? ?(+) Dental Advisory Given ?  ?Pulmonary ?asthma , sleep apnea , COPD, former smoker,  ?  ?breath sounds clear to auscultation ? ? ? ? ? ? Cardiovascular ?hypertension, Pt. on medications and Pt. on home beta blockers ? ?Rhythm:Regular Rate:Normal ? ? ?  ?Neuro/Psych ?negative neurological ROS ?   ? GI/Hepatic ?Neg liver ROS, hiatal hernia, GERD  ,  ?Endo/Other  ?diabetes, Type 2 ? Renal/GU ?ESRFRenal disease (s/p kidney transplant)  ? ?  ?Musculoskeletal ? ?(+) Arthritis ,  ? Abdominal ?  ?Peds ? Hematology ?negative hematology ROS ?(+)   ?Anesthesia Other Findings ? ? Reproductive/Obstetrics ? ?  ? ? ? ? ? ? ? ? ? ? ? ? ? ?  ?  ? ? ? ? ? ? ? ?Lab Results  ?Component Value Date  ? WBC 7.7 04/04/2021  ? HGB 12.3 04/04/2021  ? HCT 40.1 04/04/2021  ? MCV 96.2 04/04/2021  ? PLT 232 04/04/2021  ? ?Lab Results  ?Component Value Date  ? CREATININE 1.14 (H) 04/04/2021  ? BUN 26 (H) 04/04/2021  ? NA 142 04/04/2021  ? K 4.4 04/04/2021  ? CL 107 04/04/2021  ? CO2 23 04/04/2021  ? ? ?Anesthesia Physical ?Anesthesia Plan ? ?ASA: 3 ? ?Anesthesia Plan: General  ? ?Post-op Pain Management: Gabapentin PO (pre-op)* and Tylenol PO (pre-op)*  ? ?Induction: Intravenous ? ?PONV Risk Score and Plan: 3 and Midazolam, Dexamethasone, Ondansetron and Treatment may vary due to age or medical condition ? ?Airway Management Planned: Oral ETT ? ?Additional Equipment:  ? ?Intra-op Plan:  ? ?Post-operative Plan: Extubation in OR ? ?Informed Consent: I have reviewed the patients History and Physical, chart, labs and discussed the procedure including the risks, benefits and alternatives for the proposed anesthesia with the patient or authorized  representative who has indicated his/her understanding and acceptance.  ? ? ? ?Dental advisory given ? ?Plan Discussed with: CRNA ? ?Anesthesia Plan Comments: (PAT note by Karoline Caldwell, PA-C: ? 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  Followed by transplant medicine at [place] seen 03/20/2021 and noted to have excellent allograft function, creatinine 1.21.  Discussed upcoming lumbar fusion.  No changes made to management.  Advised to follow-up in 1 year. ? 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  She was seen by ID and completed 9 months of isoniazid and B6. ? ?Patient was admitted at [facility] in December 2022 for acute respiratory failure with hypoxia secondary to COVID infection.  She was treated with steroids and remdesivir.  Initially required O2 but this was weaned during admission and was not required at discharge.  She was seen in outpatient follow-up by her PCP 01/23/2021 and still having some mild shortness of breath, but noted to be continuing to improve post discharge. ? ?Patient denied shortness of breath, fever, cough, chest pain at prevention testing appointment. ? ?History of OSA, intolerant to CPAP. ? ?Non-insulin-dependent DM2, last A1c 7.6 on 01/23/2021. ? ?Preop labs reviewed, unremarkable. ? ?EKG 01/10/2021 (in the setting of admission for ARF secondary to [place]): Sinus tachycardia.  Rate 103.  Probable LAE.  Anterior infarct, old. ? ?  EKG 12/30/2019: Sinus rhythm with short PR.  Rate 79.  Possible LAE.  Low voltage QRS.  Cannot rule out anterior infarct, age undetermined. ? ?Event monitor 01/23/2020: ?7 days of data recorded on Zio monitor. Patient had a min HR of 56 bpm, max HR of 143 bpm, and avg HR of 91 bpm. Predominant underlying rhythm was Sinus Rhythm. No VT, SVT, atrial fibrillation, high degree block, or pauses noted. Isolated atrial and ventricular ectopy was rare (<1%). There were 0 triggered  events. No significant arrhythmias detected. ?? ?Nuclear stress test 01/11/2020: ?? Nuclear stress EF: 72%. ?? The left ventricular ejection fraction is hyperdynamic (>65%). ?? There was no ST segment deviation noted during stress. ?? The study is normal. ?? This is a low risk study. ?? ?Normal resting and stress perfusion. No ischemia or infarction EF ?72% ?? ?TTE 12/26/2019: ??1. Left ventricular ejection fraction, by estimation, is 60 to 65%. The  ?left ventricle has normal function. The left ventricle has no regional  ?wall motion abnormalities. There is mild concentric left ventricular  ?hypertrophy. Left ventricular diastolic  ?parameters are consistent with Grade II diastolic dysfunction  ?(pseudonormalization). Elevated left ventricular end-diastolic pressure.  ??2. Right ventricular systolic function is normal. The right ventricular  ?size is normal. There is normal pulmonary artery systolic pressure.  ??3. Left atrial size was mildly dilated.  ??4. A small pericardial effusion is present. The pericardial effusion is  ?circumferential.  ??5. The mitral valve is normal in structure. Trivial mitral valve  ?regurgitation. No evidence of mitral stenosis.  ??6. The aortic valve is grossly normal. Aortic valve regurgitation is not  ?visualized. No aortic stenosis is present.  ??7. The inferior vena cava is normal in size with greater than 50%  ?respiratory variability, suggesting right atrial pressure of 3 mmHg.  ??8. Cannot exclude intra-atrial shunt.  ? ?Exercise stress echo 01/07/2019 (Care Everywhere): ?SUMMARY  ?The patient had no chest pain.  ?The patient achieved 94 % of maximum predicted heart rate.  ?The METS?achieved was 7.  ?Exercise capacity was fair.  ?Normal left ventricular function at rest. The estimated LV ejection  ?fraction is 60-65% .  ?Normal left ventricular function and global wall motion with stress.  ?The estimated LV ejection fraction is >70% with stress.  ?Negative exercise  echocardiography for inducible ischemia at target  ?heart rate.  ?Negative stress ECG for inducible ischemia at target heart rate.  ? ?)  ? ? ? ? ? ?Anesthesia Quick Evaluation ? ?

## 2021-04-11 ENCOUNTER — Ambulatory Visit (HOSPITAL_COMMUNITY): Payer: BC Managed Care – PPO

## 2021-04-11 ENCOUNTER — Ambulatory Visit (HOSPITAL_COMMUNITY): Payer: BC Managed Care – PPO | Admitting: Physician Assistant

## 2021-04-11 ENCOUNTER — Encounter (HOSPITAL_COMMUNITY)
Admission: RE | Disposition: A | Discharge status: Home / Self Care | Payer: Self-pay | Source: Home / Self Care | Attending: Neurological Surgery

## 2021-04-11 ENCOUNTER — Observation Stay (HOSPITAL_COMMUNITY)
Admission: RE | Admit: 2021-04-11 | Discharge: 2021-04-12 | Disposition: A | Discharge status: Home / Self Care | Payer: BC Managed Care – PPO | Attending: Neurological Surgery | Admitting: Neurological Surgery

## 2021-04-11 ENCOUNTER — Other Ambulatory Visit: Payer: Self-pay

## 2021-04-11 ENCOUNTER — Encounter (HOSPITAL_COMMUNITY): Payer: Self-pay | Admitting: Neurological Surgery

## 2021-04-11 ENCOUNTER — Ambulatory Visit: Payer: BC Managed Care – PPO | Admitting: Behavioral Health

## 2021-04-11 DIAGNOSIS — Z940 Kidney transplant status: Secondary | ICD-10-CM | POA: Insufficient documentation

## 2021-04-11 DIAGNOSIS — I10 Essential (primary) hypertension: Secondary | ICD-10-CM | POA: Insufficient documentation

## 2021-04-11 DIAGNOSIS — Z79899 Other long term (current) drug therapy: Secondary | ICD-10-CM | POA: Diagnosis not present

## 2021-04-11 DIAGNOSIS — M4316 Spondylolisthesis, lumbar region: Secondary | ICD-10-CM | POA: Diagnosis present

## 2021-04-11 DIAGNOSIS — Z87891 Personal history of nicotine dependence: Secondary | ICD-10-CM | POA: Diagnosis not present

## 2021-04-11 DIAGNOSIS — M5416 Radiculopathy, lumbar region: Secondary | ICD-10-CM | POA: Diagnosis not present

## 2021-04-11 DIAGNOSIS — E119 Type 2 diabetes mellitus without complications: Secondary | ICD-10-CM | POA: Diagnosis not present

## 2021-04-11 HISTORY — PX: TRANSFORAMINAL LUMBAR INTERBODY FUSION W/ MIS 1 LEVEL: SHX6145

## 2021-04-11 LAB — ABO/RH: ABO/RH(D): A POS

## 2021-04-11 LAB — GLUCOSE, CAPILLARY
Glucose-Capillary: 133 mg/dL — ABNORMAL HIGH (ref 70–99)
Glucose-Capillary: 154 mg/dL — ABNORMAL HIGH (ref 70–99)
Glucose-Capillary: 150 mg/dL — ABNORMAL HIGH (ref 70–99)

## 2021-04-11 SURGERY — MINIMALLY INVASIVE (MIS) TRANSFORAMINAL LUMBAR INTERBODY FUSION (TLIF) 1 LEVEL
Anesthesia: General | Site: Spine Lumbar | Laterality: Right

## 2021-04-11 MED ORDER — ONDANSETRON HCL 4 MG/2ML IJ SOLN
INTRAMUSCULAR | Status: DC | PRN
  Administered 2021-04-11: 4 mg via INTRAVENOUS

## 2021-04-11 MED ORDER — PHENOL 1.4 % MT LIQD: 1.0000 | OROMUCOSAL | Status: DC | PRN

## 2021-04-11 MED ORDER — SODIUM CHLORIDE 0.9% FLUSH: 3.0000 mL | INTRAVENOUS | Status: DC | PRN

## 2021-04-11 MED ORDER — INSULIN ASPART 100 UNIT/ML IJ SOLN: 0.0000 [IU] | INTRAMUSCULAR | Status: DC | PRN

## 2021-04-11 MED ORDER — PROPOFOL 10 MG/ML IV BOLUS
INTRAVENOUS | Status: AC
  Filled 2021-04-11: qty 20

## 2021-04-11 MED ORDER — THROMBIN 5000 UNITS EX SOLR
CUTANEOUS | Status: AC
  Filled 2021-04-11: qty 5000

## 2021-04-11 MED ORDER — PHENYLEPHRINE 40 MCG/ML (10ML) SYRINGE FOR IV PUSH (FOR BLOOD PRESSURE SUPPORT)
PREFILLED_SYRINGE | INTRAVENOUS | Status: DC | PRN
  Administered 2021-04-11: 160 ug via INTRAVENOUS
  Administered 2021-04-11 (×3): 80 ug via INTRAVENOUS

## 2021-04-11 MED ORDER — DEXAMETHASONE SODIUM PHOSPHATE 10 MG/ML IJ SOLN
INTRAMUSCULAR | Status: AC
  Filled 2021-04-11: qty 1

## 2021-04-11 MED ORDER — GABAPENTIN 300 MG PO CAPS
300.0000 mg | ORAL_CAPSULE | Freq: Once | ORAL | Status: AC
  Administered 2021-04-11: 300 mg via ORAL
  Filled 2021-04-11: qty 1

## 2021-04-11 MED ORDER — DEXAMETHASONE SODIUM PHOSPHATE 10 MG/ML IJ SOLN
INTRAMUSCULAR | Status: DC | PRN
  Administered 2021-04-11: 5 mg via INTRAVENOUS

## 2021-04-11 MED ORDER — ROCURONIUM BROMIDE 10 MG/ML (PF) SYRINGE
PREFILLED_SYRINGE | INTRAVENOUS | Status: AC
  Filled 2021-04-11: qty 10

## 2021-04-11 MED ORDER — ACETAMINOPHEN 650 MG RE SUPP: 650.0000 mg | RECTAL | Status: DC | PRN

## 2021-04-11 MED ORDER — ONDANSETRON HCL 4 MG/2ML IJ SOLN
4.0000 mg | Freq: Four times a day (QID) | INTRAMUSCULAR | Status: DC | PRN
  Administered 2021-04-11: 4 mg via INTRAVENOUS
  Filled 2021-04-11: qty 2

## 2021-04-11 MED ORDER — CHLORHEXIDINE GLUCONATE CLOTH 2 % EX PADS: 6.0000 | MEDICATED_PAD | Freq: Once | CUTANEOUS | Status: DC

## 2021-04-11 MED ORDER — SULFAMETHOXAZOLE-TRIMETHOPRIM 400-80 MG PO TABS
1.0000 | ORAL_TABLET | ORAL | Status: DC
  Administered 2021-04-12: 1 via ORAL
  Filled 2021-04-11: qty 1

## 2021-04-11 MED ORDER — AMLODIPINE BESYLATE 5 MG PO TABS
10.0000 mg | ORAL_TABLET | Freq: Every day | ORAL | Status: DC
  Filled 2021-04-11: qty 2

## 2021-04-11 MED ORDER — SODIUM CHLORIDE 0.9 % IV SOLN: INTRAVENOUS | Status: DC

## 2021-04-11 MED ORDER — ONDANSETRON HCL 4 MG/2ML IJ SOLN
INTRAMUSCULAR | Status: AC
  Filled 2021-04-11: qty 2

## 2021-04-11 MED ORDER — HYDROCODONE-ACETAMINOPHEN 5-325 MG PO TABS
2.0000 | ORAL_TABLET | ORAL | Status: DC | PRN
  Administered 2021-04-11 – 2021-04-12 (×5): 2 via ORAL
  Filled 2021-04-11 (×5): qty 2

## 2021-04-11 MED ORDER — MENTHOL 3 MG MT LOZG: 1.0000 | LOZENGE | OROMUCOSAL | Status: DC | PRN

## 2021-04-11 MED ORDER — LIDOCAINE-EPINEPHRINE 1 %-1:100000 IJ SOLN
INTRAMUSCULAR | Status: AC
  Filled 2021-04-11: qty 1

## 2021-04-11 MED ORDER — FENTANYL CITRATE (PF) 100 MCG/2ML IJ SOLN
INTRAMUSCULAR | Status: AC
  Filled 2021-04-11: qty 2

## 2021-04-11 MED ORDER — MIDAZOLAM HCL 2 MG/2ML IJ SOLN
INTRAMUSCULAR | Status: AC
  Filled 2021-04-11: qty 2

## 2021-04-11 MED ORDER — METHOCARBAMOL 500 MG PO TABS
ORAL_TABLET | ORAL | Status: AC
  Filled 2021-04-11: qty 1

## 2021-04-11 MED ORDER — METHOCARBAMOL 500 MG PO TABS
500.0000 mg | ORAL_TABLET | Freq: Four times a day (QID) | ORAL | Status: DC | PRN
  Administered 2021-04-11 – 2021-04-12 (×3): 500 mg via ORAL
  Filled 2021-04-11 (×2): qty 1

## 2021-04-11 MED ORDER — HYDROXYZINE HCL 50 MG/ML IM SOLN
50.0000 mg | Freq: Four times a day (QID) | INTRAMUSCULAR | Status: DC | PRN
Start: 2021-04-11 — End: 2021-04-12
  Administered 2021-04-11: 50 mg via INTRAMUSCULAR
  Filled 2021-04-11: qty 1

## 2021-04-11 MED ORDER — PREDNISONE 5 MG PO TABS
5.0000 mg | ORAL_TABLET | Freq: Every day | ORAL | Status: DC
  Administered 2021-04-12: 5 mg via ORAL
  Filled 2021-04-11: qty 1

## 2021-04-11 MED ORDER — ACETAMINOPHEN 325 MG PO TABS: 650.0000 mg | ORAL_TABLET | ORAL | Status: DC | PRN

## 2021-04-11 MED ORDER — ONDANSETRON HCL 4 MG PO TABS: 4.0000 mg | ORAL_TABLET | Freq: Four times a day (QID) | ORAL | Status: DC | PRN

## 2021-04-11 MED ORDER — SODIUM CHLORIDE 0.9% FLUSH: 3.0000 mL | Freq: Two times a day (BID) | INTRAVENOUS | Status: DC

## 2021-04-11 MED ORDER — SODIUM CHLORIDE 0.9 % IV SOLN: 250.0000 mL | INTRAVENOUS | Status: DC

## 2021-04-11 MED ORDER — FENTANYL CITRATE (PF) 250 MCG/5ML IJ SOLN
INTRAMUSCULAR | Status: DC | PRN
  Administered 2021-04-11: 100 ug via INTRAVENOUS
  Administered 2021-04-11 (×3): 50 ug via INTRAVENOUS

## 2021-04-11 MED ORDER — SERTRALINE HCL 50 MG PO TABS: 25.0000 mg | ORAL_TABLET | Freq: Every day | ORAL | Status: DC

## 2021-04-11 MED ORDER — PROPOFOL 10 MG/ML IV BOLUS
INTRAVENOUS | Status: DC | PRN
  Administered 2021-04-11: 120 mg via INTRAVENOUS

## 2021-04-11 MED ORDER — BUPIVACAINE-EPINEPHRINE 0.5% -1:200000 IJ SOLN
INTRAMUSCULAR | Status: AC
  Filled 2021-04-11: qty 1

## 2021-04-11 MED ORDER — PHENYLEPHRINE HCL-NACL 20-0.9 MG/250ML-% IV SOLN
INTRAVENOUS | Status: DC | PRN
  Administered 2021-04-11: 20 ug/min via INTRAVENOUS

## 2021-04-11 MED ORDER — DOCUSATE SODIUM 100 MG PO CAPS
100.0000 mg | ORAL_CAPSULE | Freq: Two times a day (BID) | ORAL | Status: DC
  Administered 2021-04-11 – 2021-04-12 (×2): 100 mg via ORAL
  Filled 2021-04-11 (×2): qty 1

## 2021-04-11 MED ORDER — LIDOCAINE 2% (20 MG/ML) 5 ML SYRINGE
INTRAMUSCULAR | Status: DC | PRN
  Administered 2021-04-11: 60 mg via INTRAVENOUS

## 2021-04-11 MED ORDER — HYDROMORPHONE HCL 1 MG/ML IJ SOLN
1.0000 mg | INTRAMUSCULAR | Status: DC | PRN
  Administered 2021-04-11: 1 mg via INTRAVENOUS
  Filled 2021-04-11: qty 1

## 2021-04-11 MED ORDER — BUPIVACAINE LIPOSOME 1.3 % IJ SUSP
INTRAMUSCULAR | Status: AC
  Filled 2021-04-11: qty 20

## 2021-04-11 MED ORDER — MAGNESIUM 250 MG PO TABS: 250.0000 mg | ORAL_TABLET | Freq: Two times a day (BID) | ORAL | Status: DC

## 2021-04-11 MED ORDER — MIDAZOLAM HCL 2 MG/2ML IJ SOLN
INTRAMUSCULAR | Status: DC | PRN
  Administered 2021-04-11: 2 mg via INTRAVENOUS

## 2021-04-11 MED ORDER — TACROLIMUS 1 MG PO CAPS
8.0000 mg | ORAL_CAPSULE | Freq: Two times a day (BID) | ORAL | Status: DC
  Administered 2021-04-11 – 2021-04-12 (×2): 8 mg via ORAL
  Filled 2021-04-11 (×3): qty 8

## 2021-04-11 MED ORDER — ROCURONIUM BROMIDE 10 MG/ML (PF) SYRINGE
PREFILLED_SYRINGE | INTRAVENOUS | Status: DC | PRN
  Administered 2021-04-11: 20 mg via INTRAVENOUS
  Administered 2021-04-11: 50 mg via INTRAVENOUS
  Administered 2021-04-11: 10 mg via INTRAVENOUS
  Administered 2021-04-11: 20 mg via INTRAVENOUS

## 2021-04-11 MED ORDER — PHENYLEPHRINE HCL-NACL 20-0.9 MG/250ML-% IV SOLN
INTRAVENOUS | Status: AC
  Filled 2021-04-11: qty 500

## 2021-04-11 MED ORDER — CEFAZOLIN SODIUM-DEXTROSE 2-4 GM/100ML-% IV SOLN
2.0000 g | Freq: Three times a day (TID) | INTRAVENOUS | Status: AC
  Administered 2021-04-11 (×2): 2 g via INTRAVENOUS
  Filled 2021-04-11 (×2): qty 100

## 2021-04-11 MED ORDER — AMISULPRIDE (ANTIEMETIC) 5 MG/2ML IV SOLN
10.0000 mg | Freq: Once | INTRAVENOUS | Status: AC | PRN
  Administered 2021-04-11: 10 mg via INTRAVENOUS

## 2021-04-11 MED ORDER — CEFAZOLIN SODIUM-DEXTROSE 2-4 GM/100ML-% IV SOLN
2.0000 g | INTRAVENOUS | Status: AC
  Administered 2021-04-11: 2 g via INTRAVENOUS
  Filled 2021-04-11: qty 100

## 2021-04-11 MED ORDER — HYDROCODONE-ACETAMINOPHEN 5-325 MG PO TABS: 1.0000 | ORAL_TABLET | ORAL | Status: DC | PRN

## 2021-04-11 MED ORDER — ALBUTEROL SULFATE HFA 108 (90 BASE) MCG/ACT IN AERS: 1.0000 | INHALATION_SPRAY | Freq: Four times a day (QID) | RESPIRATORY_TRACT | Status: DC | PRN

## 2021-04-11 MED ORDER — ORAL CARE MOUTH RINSE: 15.0000 mL | Freq: Once | OROMUCOSAL | Status: AC

## 2021-04-11 MED ORDER — SODIUM BICARBONATE 650 MG PO TABS
650.0000 mg | ORAL_TABLET | Freq: Three times a day (TID) | ORAL | Status: DC
  Administered 2021-04-12: 650 mg via ORAL
  Filled 2021-04-11 (×5): qty 1

## 2021-04-11 MED ORDER — PANTOPRAZOLE SODIUM 40 MG PO TBEC
40.0000 mg | DELAYED_RELEASE_TABLET | Freq: Every day | ORAL | Status: DC
Start: 2021-04-12 — End: 2021-04-12
  Administered 2021-04-12: 40 mg via ORAL
  Filled 2021-04-11: qty 1

## 2021-04-11 MED ORDER — 0.9 % SODIUM CHLORIDE (POUR BTL) OPTIME
TOPICAL | Status: DC | PRN
  Administered 2021-04-11: 1000 mL

## 2021-04-11 MED ORDER — PROCHLORPERAZINE EDISYLATE 10 MG/2ML IJ SOLN
10.0000 mg | INTRAMUSCULAR | Status: DC | PRN
  Filled 2021-04-11: qty 2

## 2021-04-11 MED ORDER — HEMOSTATIC AGENTS (NO CHARGE) OPTIME
TOPICAL | Status: DC | PRN
Start: 2021-04-11 — End: 2021-04-11
  Administered 2021-04-11: 1 via TOPICAL

## 2021-04-11 MED ORDER — GELATIN ABSORBABLE MT POWD: OROMUCOSAL | Status: DC | PRN

## 2021-04-11 MED ORDER — FENTANYL CITRATE (PF) 250 MCG/5ML IJ SOLN
INTRAMUSCULAR | Status: AC
  Filled 2021-04-11: qty 5

## 2021-04-11 MED ORDER — FENTANYL CITRATE (PF) 100 MCG/2ML IJ SOLN
25.0000 ug | INTRAMUSCULAR | Status: DC | PRN
  Administered 2021-04-11 (×3): 50 ug via INTRAVENOUS

## 2021-04-11 MED ORDER — PHENYLEPHRINE 40 MCG/ML (10ML) SYRINGE FOR IV PUSH (FOR BLOOD PRESSURE SUPPORT)
PREFILLED_SYRINGE | INTRAVENOUS | Status: AC
  Filled 2021-04-11: qty 10

## 2021-04-11 MED ORDER — BUPIVACAINE LIPOSOME 1.3 % IJ SUSP
INTRAMUSCULAR | Status: DC | PRN
  Administered 2021-04-11: 20 mL

## 2021-04-11 MED ORDER — SUGAMMADEX SODIUM 200 MG/2ML IV SOLN
INTRAVENOUS | Status: DC | PRN
Start: 2021-04-11 — End: 2021-04-11
  Administered 2021-04-11: 200 mg via INTRAVENOUS

## 2021-04-11 MED ORDER — LACTATED RINGERS IV SOLN: INTRAVENOUS | Status: DC | PRN

## 2021-04-11 MED ORDER — AMISULPRIDE (ANTIEMETIC) 5 MG/2ML IV SOLN
INTRAVENOUS | Status: AC
  Filled 2021-04-11: qty 4

## 2021-04-11 MED ORDER — MAGNESIUM OXIDE -MG SUPPLEMENT 400 (240 MG) MG PO TABS
400.0000 mg | ORAL_TABLET | Freq: Every day | ORAL | Status: DC
  Administered 2021-04-12: 400 mg via ORAL
  Filled 2021-04-11 (×2): qty 1

## 2021-04-11 MED ORDER — METHOCARBAMOL 1000 MG/10ML IJ SOLN
500.0000 mg | Freq: Four times a day (QID) | INTRAVENOUS | Status: DC | PRN
  Filled 2021-04-11: qty 5

## 2021-04-11 MED ORDER — CHLORHEXIDINE GLUCONATE 0.12 % MT SOLN
15.0000 mL | Freq: Once | OROMUCOSAL | Status: AC
  Administered 2021-04-11: 15 mL via OROMUCOSAL
  Filled 2021-04-11: qty 15

## 2021-04-11 MED ORDER — LACTATED RINGERS IV SOLN: INTRAVENOUS | Status: DC

## 2021-04-11 MED ORDER — ALBUTEROL SULFATE (2.5 MG/3ML) 0.083% IN NEBU: 2.5000 mg | INHALATION_SOLUTION | Freq: Four times a day (QID) | RESPIRATORY_TRACT | Status: DC | PRN

## 2021-04-11 MED ORDER — BUPIVACAINE-EPINEPHRINE (PF) 0.5% -1:200000 IJ SOLN
INTRAMUSCULAR | Status: DC | PRN
  Administered 2021-04-11: 10 mL

## 2021-04-11 MED ORDER — LIDOCAINE 2% (20 MG/ML) 5 ML SYRINGE
INTRAMUSCULAR | Status: AC
  Filled 2021-04-11: qty 5

## 2021-04-11 MED ORDER — ACETAMINOPHEN 500 MG PO TABS
1000.0000 mg | ORAL_TABLET | Freq: Once | ORAL | Status: AC
  Administered 2021-04-11: 1000 mg via ORAL
  Filled 2021-04-11: qty 2

## 2021-04-11 SURGICAL SUPPLY — 77 items
ADH SKN CLS APL DERMABOND .7 (GAUZE/BANDAGES/DRESSINGS) ×1
BAG COUNTER SPONGE SURGICOUNT (BAG) ×3 IMPLANT
BAG SPNG CNTER NS LX DISP (BAG) ×1
BAND INSRT 18 STRL LF DISP RB (MISCELLANEOUS) ×2
BAND RUBBER #18 3X1/16 STRL (MISCELLANEOUS) ×6 IMPLANT
BASKET BONE COLLECTION (BASKET) ×1 IMPLANT
BUR MATCHSTICK NEURO 3.0 LAGG (BURR) ×3 IMPLANT
CAGE CONVEX CASCADIA 8.5X22X11 (Cage) ×1 IMPLANT
CNTNR URN SCR LID CUP LEK RST (MISCELLANEOUS) ×2 IMPLANT
CONT SPEC 4OZ STRL OR WHT (MISCELLANEOUS) ×2
COVER BACK TABLE 60X90IN (DRAPES) ×3 IMPLANT
DECANTER SPIKE VIAL GLASS SM (MISCELLANEOUS) ×2 IMPLANT
DERMABOND ADVANCED (GAUZE/BANDAGES/DRESSINGS) ×1
DERMABOND ADVANCED .7 DNX12 (GAUZE/BANDAGES/DRESSINGS) IMPLANT
DRAIN JACKSON RD 7FR 3/32 (WOUND CARE) IMPLANT
DRAPE C-ARM 42X72 X-RAY (DRAPES) ×6 IMPLANT
DRAPE C-ARMOR (DRAPES) IMPLANT
DRAPE LAPAROTOMY 100X72X124 (DRAPES) ×3 IMPLANT
DRAPE MICROSCOPE LEICA (MISCELLANEOUS) ×3 IMPLANT
DRAPE STERI IOBAN 125X83 (DRAPES) IMPLANT
DRAPE SURG 17X23 STRL (DRAPES) ×3 IMPLANT
DRSG OPSITE POSTOP 3X4 (GAUZE/BANDAGES/DRESSINGS) ×2 IMPLANT
DURAPREP 26ML APPLICATOR (WOUND CARE) ×3 IMPLANT
ELECT BLADE INSULATED 6.5IN (ELECTROSURGICAL) ×2
ELECT REM PT RETURN 9FT ADLT (ELECTROSURGICAL) ×2
ELECTRODE BLDE INSULATED 6.5IN (ELECTROSURGICAL) ×2 IMPLANT
ELECTRODE REM PT RTRN 9FT ADLT (ELECTROSURGICAL) ×2 IMPLANT
GAUZE 4X4 16PLY ~~LOC~~+RFID DBL (SPONGE) IMPLANT
GAUZE SPONGE 4X4 12PLY STRL (GAUZE/BANDAGES/DRESSINGS) ×3 IMPLANT
GLOVE SRG 8 PF TXTR STRL LF DI (GLOVE) ×4 IMPLANT
GLOVE SURG ENC MOIS LTX SZ8 (GLOVE) ×6 IMPLANT
GLOVE SURG LTX SZ8 (GLOVE) ×8 IMPLANT
GLOVE SURG PR MICRO ENCORE 7 (GLOVE) ×6 IMPLANT
GLOVE SURG UNDER LTX SZ6.5 (GLOVE) ×7 IMPLANT
GLOVE SURG UNDER POLY LF SZ8 (GLOVE) ×4
GLOVE SURG UNDER POLY LF SZ8.5 (GLOVE) ×6 IMPLANT
GOWN STRL REUS W/ TWL LRG LVL3 (GOWN DISPOSABLE) IMPLANT
GOWN STRL REUS W/ TWL XL LVL3 (GOWN DISPOSABLE) ×4 IMPLANT
GOWN STRL REUS W/TWL 2XL LVL3 (GOWN DISPOSABLE) IMPLANT
GOWN STRL REUS W/TWL LRG LVL3 (GOWN DISPOSABLE) ×2
GOWN STRL REUS W/TWL XL LVL3 (GOWN DISPOSABLE) ×4
GUIDEWIRE EVEREST 1.4X620 2-PK (WIRE) ×2 IMPLANT
KIT BASIN OR (CUSTOM PROCEDURE TRAY) ×3 IMPLANT
KIT INFUSE XX SMALL 0.7CC (Orthopedic Implant) ×1 IMPLANT
KIT POSITION SURG JACKSON T1 (MISCELLANEOUS) ×3 IMPLANT
KIT TURNOVER KIT B (KITS) ×3 IMPLANT
MARKER SKIN DUAL TIP RULER LAB (MISCELLANEOUS) ×3 IMPLANT
NDL BIOPSY DD SERENGETI 8G (NEEDLE) IMPLANT
NDL HYPO 21X1.5 SAFETY (NEEDLE) IMPLANT
NDL HYPO 25X1 1.5 SAFETY (NEEDLE) ×2 IMPLANT
NEEDLE BIOPSY DD SERENGETI 8G (NEEDLE) ×4 IMPLANT
NEEDLE HYPO 21X1.5 SAFETY (NEEDLE) ×2 IMPLANT
NEEDLE HYPO 25X1 1.5 SAFETY (NEEDLE) ×2 IMPLANT
NS IRRIG 1000ML POUR BTL (IV SOLUTION) ×3 IMPLANT
PACK LAMINECTOMY NEURO (CUSTOM PROCEDURE TRAY) ×3 IMPLANT
PAD ARMBOARD 7.5X6 YLW CONV (MISCELLANEOUS) ×12 IMPLANT
PATTIES SURGICAL .5 X.5 (GAUZE/BANDAGES/DRESSINGS) IMPLANT
PATTIES SURGICAL .5 X1 (DISPOSABLE) IMPLANT
PATTIES SURGICAL 1X1 (DISPOSABLE) IMPLANT
PUTTY BONE 100 VESUVIUS 2.5CC (Putty) ×1 IMPLANT
ROD SPINAL DENAIL 5.5X40 (Rod) ×2 IMPLANT
SCREW CANN EVEREST 7.5X50 (Screw) ×2 IMPLANT
SCREW PA FENS EVEREST 6.5X50 (Screw) ×2 IMPLANT
SET SCREW (Screw) ×8 IMPLANT
SET SCREW VRST (Screw) IMPLANT
SPONGE SURGIFOAM ABS GEL SZ50 (HEMOSTASIS) ×1 IMPLANT
SPONGE T-LAP 4X18 ~~LOC~~+RFID (SPONGE) IMPLANT
STAPLER VISISTAT 35W (STAPLE) IMPLANT
SUT VIC AB 0 CT1 18XCR BRD8 (SUTURE) ×2 IMPLANT
SUT VIC AB 0 CT1 8-18 (SUTURE) ×2
SUT VIC AB 2-0 CP2 18 (SUTURE) ×3 IMPLANT
SUT VIC AB 3-0 SH 8-18 (SUTURE) ×4 IMPLANT
SYR 20ML LL LF (SYRINGE) ×1 IMPLANT
TOWEL GREEN STERILE (TOWEL DISPOSABLE) IMPLANT
TOWEL GREEN STERILE FF (TOWEL DISPOSABLE) IMPLANT
TRAY FOLEY MTR SLVR 16FR STAT (SET/KITS/TRAYS/PACK) ×3 IMPLANT
WATER STERILE IRR 1000ML POUR (IV SOLUTION) ×3 IMPLANT

## 2021-04-11 NOTE — Anesthesia Procedure Notes (Signed)
Procedure Name: Intubation ?Date/Time: 04/11/2021 7:41 AM ?Performed by: Barrington Ellison, CRNA ?Pre-anesthesia Checklist: Patient identified, Emergency Drugs available, Suction available and Patient being monitored ?Patient Re-evaluated:Patient Re-evaluated prior to induction ?Oxygen Delivery Method: Circle System Utilized ?Preoxygenation: Pre-oxygenation with 100% oxygen ?Induction Type: IV induction ?Ventilation: Mask ventilation without difficulty ?Laryngoscope Size: Mac and 3 ?Grade View: Grade I ?Tube type: Oral ?Number of attempts: 1 ?Airway Equipment and Method: Stylet and Oral airway ?Placement Confirmation: ETT inserted through vocal cords under direct vision, positive ETCO2 and breath sounds checked- equal and bilateral ?Secured at: 21 cm ?Tube secured with: Tape ?Dental Injury: Teeth and Oropharynx as per pre-operative assessment  ?Comments: Intubated by Everlene Other SRNA ? ? ? ? ?

## 2021-04-11 NOTE — Transfer of Care (Signed)
Immediate Anesthesia Transfer of Care Note ? ?Patient: Kristen Moreno ? ?Procedure(s) Performed: MINIMALLY INVASIVE  DECOMPRESSION, TRANSFORAMINAL LUMBAR INTERBODY FUSION LUMABR FOUR-FIVE (Right: Spine Lumbar) ? ?Patient Location: PACU ? ?Anesthesia Type:General ? ?Level of Consciousness: awake and alert  ? ?Airway & Oxygen Therapy: Patient Spontanous Breathing ? ?Post-op Assessment: Report given to RN and Post -op Vital signs reviewed and stable ? ?Post vital signs: Reviewed and stable ? ?Last Vitals:  ?Vitals Value Taken Time  ?BP 151/76   ?Temp    ?Pulse 87 04/11/21 1026  ?Resp    ?SpO2 100 % 04/11/21 1026  ?Vitals shown include unvalidated device data. ? ?Last Pain:  ?Vitals:  ? 04/11/21 0610  ?TempSrc:   ?PainSc: 5   ?   ? ?Patients Stated Pain Goal: 2 (04/11/21 0610) ? ?Complications: No notable events documented. ?

## 2021-04-11 NOTE — Op Note (Signed)
? ?Providing Compassionate, Quality Care - Together ? ?Date of service: 04/11/2021 ? 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  ?POSTOP DIAGNOSIS: Same ?  ?PROCEDURE: ?Minimally invasive L 4-5 decompression and transforaminal lumbar interbody fusion and arthrodesis, right sided approach ?Bilateral L4, L5 nonsegmental pedicle screw instrumentation, K2 M [place] L4: 6.5 x 50 mm bilaterally, L5: 7.5 x 50 mm bilaterally ?Placement of anterior biomechanical device, K2 M [place] titanium interbody device 11 x 8.5 x 22 mm ?Intraoperative use of autograft, same incision ?Intraoperative use of allograft  ?Intraoperative use of BMP, xxs ?Intraoperative use of fluoroscopy, greater than 1 hour ?Intraoperative use of microscope, for microdissection ?  ?SURGEON: Dr. Pieter Partridge C. Accalia Rigdon, DO ?  ?ASSISTANT: None ?  ?ANESTHESIA: General Endotracheal ?  ?EBL: 50 cc ?  ?SPECIMENS: None ?  ?DRAINS: None ?  ?COMPLICATIONS: None ?  ?CONDITION: Hemodynamically stable ?  ?HISTORY: ?Kristen Moreno is a 61 y.o. y.o. female who initially presented to the outpatient clinic with signs and symptoms consistent with right greater than left lower extremity L4 and L5 radiculopathy as well as worsening back pain. MRI demonstrated severe stenosis at L4-5 due to degenerative spondylolisthesis, this was also found to be mobile on flexion and extension x-rays. Treatment options were discussed including epidural steroid injections, which she failed, physical therapy and pain control.  Given her failure of conservative measures, I did offer her surgical intervention in the form of a decompression, instrumentation and fusion at L4-5. All risks, benefits and expected outcomes were discussed agreed upon.  We did discuss the possibility of wound infection specifically as she does have a history of a renal transplant, and is on chronic immunosuppressive therapy.  She did  receive clearance from her primary and nephrologist.  We discussed and agreed upon risks, informed consent was obtained. ?  ?PROCEDURE IN DETAIL: ?The patient was brought to the operating room. After induction of general anesthesia, the patient was positioned on the operative table in the prone position on the Nelson table. All pressure points were meticulously padded. Skin incision was then marked out and prepped and draped in the usual sterile fashion. Physician driven timeout was performed. ?  ?Using biplanar fluoroscopy, the C arm for sterilely draped and brought into the field and the paramedian incisions were planned over the L4-5 interspace.  Local anesthetic was injected bilaterally.  Using a 10 blade, paramedian incision was created bilaterally.  Using Jamshidi, the bilateral L4 pedicles were accessed under biplanar fluoroscopy.  K wires were placed and the Jamshidi's were removed.  This was repeated bilaterally at L5.  Attention was then turned to docking the Metrix dilator.  Using with a series of dilators, on the patient's right side the facet lamina junction was docked with a 26 x 6 mm tube.  This was confirmed to be in the appropriate trajectory under lateral fluoroscopy.  At this point the microscope was sterilely draped and brought into the field. ?  ?Using Bovie electrocautery, soft tissue was cleared from the lamina and facet at L4-5.  Using a high-speed drill and collecting the autograft, a laminectomy and complete facetectomy was performed down to the ligamentum flavum.  This autograft was saved for later.  The ligamentum flavum was identified to its attachment along the superior lamina and lateral pars.  Using a series of micro curettes, the ligamentum flavum was gently elevated and the epidural space was identified.  The ligamentum flavum was completely resected  using Kerrison rongeurs.  The traversing and exiting nerve roots were identified.  Using Kerrison rongeurs, the common dural tube and  traversing and exiting nerve roots were completely decompressed from the ligamentum flavum.  They appeared pulsatile and noncompressed.  Camden's triangle was identified, the traversing nerve root was gently retracted medially, and the epidural space was coagulated and cleared of epidural veins.  The disc space was identified.  The disc annulus was then coagulated and incised with an 11 blade.  Using Kerrison rongeurs the annulotomy was widened.  Using a series of disc prep shavers and curettes a radical discectomy was performed to subchondral bleeding bone at both endplates.  The disc base was then trialed and found to be in 11 mm interbody size.  A mixture of autograft and allograft was then placed anteriorly and medially with BMP and packed with a bone tamp.  Using a nerve root retractor, the traversing nerve root was gently protected during placement of the interbody. ?  ?Using lateral fluoroscopy, the interbody was then placed under live fluoroscopy.  Appropriate placement was identified.  The remainder of the autograft and allograft was then placed laterally and the intervertebral space to the interbody device.  Hemostasis was achieved with passive hemostatics and bipolar cautery.  The traversing nerve root,, neural tube and exiting nerve root were followed with a ball-tipped probe and noted to be noncompressed, pulsatile and in their normal position.  A few pieces of Gelfoam with thrombin were placed over the thecal sac.  The Metrix dilator tube was gently removed and hemostasis was achieved with bipolar cautery and the soft tissues. ?  ?The above listed pedicle screws were then placed under lateral fluoroscopy over the K wires bilaterally at L4, and L5.  Appropriate sized rods were measured, contoured and placed percutaneously.  These were confirmed to be within the tulips, setscrews were placed and final tightened to the manufacturer's recommendations.  Percutaneous towers were removed from the screws.   Final AP and lateral fluoroscopy images confirmed appropriate placement of all hardware.  Hemostasis was achieved with bipolar cautery.  The wound was closed in layers, 2-0 and 3-0 Vicryl sutures for the dermis.  Skin was closed with skin glue.  Sterile dressing was applied. ?  ?At the end of the case all sponge, needle, and instrument counts were correct. The patient was then transferred to the stretcher, extubated, and taken to the post-anesthesia care unit in stable hemodynamic condition. ? ?

## 2021-04-11 NOTE — Anesthesia Postprocedure Evaluation (Signed)
Anesthesia Post Note ? ?Patient: Kristen Moreno ? ?Procedure(s) Performed: MINIMALLY INVASIVE  DECOMPRESSION, TRANSFORAMINAL LUMBAR INTERBODY FUSION LUMABR FOUR-FIVE (Right: Spine Lumbar) ? ?  ? ?Patient location during evaluation: PACU ?Anesthesia Type: General ?Level of consciousness: awake and alert ?Pain management: pain level controlled ?Vital Signs Assessment: post-procedure vital signs reviewed and stable ?Respiratory status: spontaneous breathing, nonlabored ventilation, respiratory function stable and patient connected to nasal cannula oxygen ?Cardiovascular status: blood pressure returned to baseline and stable ?Postop Assessment: no apparent nausea or vomiting ?Anesthetic complications: no ? ? ?No notable events documented. ? ?Last Vitals:  ?Vitals:  ? 04/11/21 1155 04/11/21 1237  ?BP: 135/72 (!) 151/93  ?Pulse: 71 94  ?Resp: (!) 22 18  ?Temp:  36.5 ?C  ?SpO2: 96% 99%  ?  ?Last Pain:  ?Vitals:  ? 04/11/21 1330  ?TempSrc:   ?PainSc: Asleep  ? ? ?  ?  ?  ?  ?  ?  ? ?Suzette Battiest E ? ? ? ? ?
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
## 2021-04-12 DIAGNOSIS — M4316 Spondylolisthesis, lumbar region: Secondary | ICD-10-CM | POA: Diagnosis not present

## 2021-04-12 LAB — GLUCOSE, CAPILLARY
Glucose-Capillary: 137 mg/dL — ABNORMAL HIGH (ref 70–99)
Glucose-Capillary: 132 mg/dL — ABNORMAL HIGH (ref 70–99)

## 2021-04-12 LAB — BASIC METABOLIC PANEL
Anion gap: 8 (ref 5–15)
BUN: 17 mg/dL (ref 6–20)
CO2: 24 mmol/L (ref 22–32)
Calcium: 8.7 mg/dL — ABNORMAL LOW (ref 8.9–10.3)
Chloride: 106 mmol/L (ref 98–111)
Creatinine, Ser: 1.4 mg/dL — ABNORMAL HIGH (ref 0.44–1.00)
GFR, Estimated: 44 mL/min — ABNORMAL LOW (ref >60)
Glucose, Bld: 134 mg/dL — ABNORMAL HIGH (ref 70–99)
Potassium: 4.2 mmol/L (ref 3.5–5.1)
Sodium: 138 mmol/L (ref 135–145)

## 2021-04-12 MED ORDER — MYCOPHENOLATE SODIUM 180 MG PO TBEC: 540.0000 mg | DELAYED_RELEASE_TABLET | Freq: Two times a day (BID) | ORAL | Status: AC

## 2021-04-12 MED ORDER — METHOCARBAMOL 500 MG PO TABS: 500.0000 mg | ORAL_TABLET | Freq: Four times a day (QID) | ORAL | 1 refills | Status: DC | PRN

## 2021-04-12 MED ORDER — HYDROCODONE-ACETAMINOPHEN 5-325 MG PO TABS: 1.0000 | ORAL_TABLET | ORAL | 0 refills | Status: DC | PRN

## 2021-04-12 NOTE — Progress Notes (Signed)
Patient awaiting for transport via wheelchair by volunteer for discharge to home; with complaints of moderate pain on her lower back and was recently medicated for it; incision on her back with honeycomb dressing and is clean, dry and intact; room was checked for all her belongings; discharge instructions concerning her medications, incision care, follow up appointment and when to call the doctor as needed were all discussed with patient by RN and she verbalized understanding on the instructions given. ?

## 2021-04-12 NOTE — Evaluation (Signed)
Occupational Therapy Evaluation ?Patient Details ?Name: Kristen Moreno ?MRN: [id number] ?DOB: Jan 12, 1961 ?Today's Date: 04/12/2021 ? ? ?History of Present Illness 61 y/o female admitted on 04/11/21 following minimally invasive L4-5 decompression and fusion. PMH: OSA, kidney transplant 2021, T2DM, HTN  ? ?Clinical Impression ?  ?PTA pt lives alone independently. LB ADL tasks were difficult at baseline due to R shoulder limitations, body habitus and dyspnea.  Educated pt on back precautions and use of compensatory strategies and AE to maximize safety and independence. Pt with increased complaints of dizziness and "feelings of passing out". Pt reclined: supine 123/63; sitting 108/60. Began complaining again of dizziness, pt reclined and BP 138/62 supine. Nsg made aware. Pt states she has not eaten anything this am. Reviewed all information, no further OT needed.  ?   ? ?Recommendations for follow up therapy are one component of a multi-disciplinary discharge planning process, led by the attending physician.  Recommendations may be updated based on patient status, additional functional criteria and insurance authorization.  ? ?Follow Up Recommendations ? No OT follow up  ?  ?Assistance Recommended at Discharge Intermittent Supervision/Assistance  ?Patient can return home with the following A little help with bathing/dressing/bathroom;Assistance with cooking/housework;Assist for transportation;Help with stairs or ramp for entrance ? ?  ?Functional Status Assessment ?    ?Equipment Recommendations ? BSC/3in1  ?  ?Recommendations for Other Services   ? ? ?  ?Precautions / Restrictions Precautions ?Precautions: Back ?Precaution Booklet Issued: Yes (comment) ?Required Braces or Orthoses:  (no brace needed) ?Restrictions ?Weight Bearing Restrictions: No (Simultaneous filing. User may not have seen previous data.)  ? ?  ? ?Mobility Bed Mobility ?Overal bed mobility: Modified Independent ?  ?  ?  ?  ?  ?  ?General bed mobility  comments: with use of bed rails but flat bed ?  ? ?Transfers ?Overall transfer level: Modified independent ?Equipment used: Rolling walker (2 wheels) ?  ?  ?  ?  ?  ?  ?  ?General transfer comment: poor hand placement after being instructed to place hands on bed ?  ? ?  ?Balance Overall balance assessment: Needs assistance ?Sitting-balance support: No upper extremity supported, Feet supported ?Sitting balance-Leahy Scale: Good ?  ?  ?Standing balance support: Bilateral upper extremity supported, Reliant on assistive device for balance ?Standing balance-Leahy Scale: Poor ?Standing balance comment: reliant on UE support ?  ?  ?  ?  ?  ?  ?  ?  ?  ?  ?  ?   ? ?ADL either performed or assessed with clinical judgement  ? ?ADL Overall ADL's : Needs assistance/impaired ?  ?  ?  ?  ?  ?  ?  ?  ?  ?  ?  ?  ?  ?  ?  ?  ?  ?  ?Functional mobility during ADLs: Rolling walker (2 wheels);Modified independent ?General ADL Comments: LB ADL were difficult at baseline due to body habitus adn breathing difficulties; educated on compensatory strategies and use of AE to increase independence with LB ADL  ? ? ? ?Vision   ?   ?   ?Perception   ?  ?Praxis   ?  ? ?Pertinent Vitals/Pain Pain Assessment ?Pain Assessment: 0-10 ?Pain Score: 10-Worst pain ever ?Pain Location: back ?Pain Descriptors / Indicators: Grimacing, Moaning, Discomfort ?Pain Intervention(s): Patient requesting pain meds-RN notified  ? ? ? ?Hand Dominance Right ?  ?Extremity/Trunk Assessment Upper Extremity Assessment ?Upper Extremity Assessment: RUE deficits/detail ?RUE Deficits / Details:  R shoulder deficits at baseline ?  ?Lower Extremity Assessment ?Lower Extremity Assessment: Defer to PT evaluation ?  ?Cervical / Trunk Assessment ?Cervical / Trunk Assessment: Back Surgery ?  ?Communication Communication ?Communication: No difficulties ?  ?Cognition Arousal/Alertness: Awake/alert ?Behavior During Therapy: [facility] for tasks assessed/performed ?Overall Cognitive Status:  Within Functional Limits for tasks assessed ?  ?  ?  ?  ?  ?  ?  ?  ?  ?  ?  ?  ?  ?  ?  ?  ?  ?  ?  ?General Comments  complaining of dizziness; nsg aware ? ?  ?Exercises   ?  ?Shoulder Instructions    ? ? ?Home Living Family/patient expects to be discharged to:: Private residence ?Living Arrangements: Children;Other relatives (grandkids) ?Available Help at Discharge: Family ?Type of Home: House ?Home Access: Level entry ?  ?  ?Home Layout: One level ?  ?  ?Bathroom Shower/Tub: Tub/shower unit ?  ?Bathroom Toilet: Standard ?Bathroom Accessibility: Yes ?How Accessible: Accessible via walker ?Home Equipment: Shower seat ?  ?  ?  ? ?  ?Prior Functioning/Environment Prior Level of Function : Independent/Modified Independent;Working/employed;Driving ?  ?  ?  ?  ?  ?  ?  ?  ?  ? ?  ?  ?OT Problem List: Decreased strength;Decreased activity tolerance;Impaired balance (sitting and/or standing);Decreased knowledge of use of DME or AE;Decreased knowledge of precautions;Pain;Impaired UE functional use ?  ?   ?OT Treatment/Interventions:    ?  ?OT Goals(Current goals can be found in the care plan section) Acute Rehab OT Goals ?Patient Stated Goal: to go home ?OT Goal Formulation: All assessment and education complete, DC therapy  ?OT Frequency:   ?  ? ?Co-evaluation   ?  ?  ?  ?  ? ?  ?AM-PAC OT "6 Clicks" Daily Activity     ?Outcome Measure Help from another person eating meals?: None ?Help from another person taking care of personal grooming?: A Little ?Help from another person toileting, which includes using toliet, bedpan, or urinal?: A Little ?Help from another person bathing (including washing, rinsing, drying)?: A Little ?Help from another person to put on and taking off regular upper body clothing?: A Little ?Help from another person to put on and taking off regular lower body clothing?: A Little ?6 Click Score: 19 ?  ?End of Session Nurse Communication: Mobility status;Other (comment) (dizziness; BP drop) ? ?Activity  Tolerance: Other (comment) (limited by dizziness) ?Patient left: in bed;with call bell/phone within reach ? ?OT Visit Diagnosis: Muscle weakness (generalized) (M62.81);Pain ?Pain - part of body:  (back)  ?              ?Time: 4332-9518 ?OT Time Calculation (min): 25 min ?Charges:  OT General Charges ?$OT Visit: 1 Visit ?OT Evaluation ?$OT Eval Low Complexity: 1 Low ? ?[facility], OT/L  ? ?Acute OT Clinical Specialist ?Acute Rehabilitation Services ?Pager [phone] ?Office [phone]  ? ?Jamaria Amborn,HILLARY ?04/12/2021, 10:03 AM ?

## 2021-04-12 NOTE — Evaluation (Signed)
Physical Therapy Evaluation & Discharge ?Patient Details ?Name: Kristen Moreno ?MRN: [id number] ?DOB: 11-May-1960 ?Today's Date: 04/12/2021 ? ?History of Present Illness ? 61 y/o female admitted on 04/11/21 following minimally invasive L4-5 decompression and fusion. PMH: OSA, kidney transplant 2021, T2DM, HTN  ?Clinical Impression ? Patient admitted following above procedure. Patient functioning at modI level with RW for mobility. Educated patient on back precautions and progressive walking program, patient verbalized understanding. No further skilled PT needs required acutely. No PT follow up recommended at this time.    ?   ? ?Recommendations for follow up therapy are one component of a multi-disciplinary discharge planning process, led by the attending physician.  Recommendations may be updated based on patient status, additional functional criteria and insurance authorization. ? ?Follow Up Recommendations No PT follow up ? ?  ?Assistance Recommended at Discharge Intermittent Supervision/Assistance  ?Patient can return home with the following ?   ? ?  ?Equipment Recommendations Rolling Shakita Keir (2 wheels)  ?Recommendations for Other Services ?    ?  ?Functional Status Assessment Patient has had a recent decline in their functional status and demonstrates the ability to make significant improvements in function in a reasonable and predictable amount of time.  ? ?  ?Precautions / Restrictions Precautions ?Precautions: Back ?Precaution Booklet Issued: Yes (comment) ?Required Braces or Orthoses:  (no brace needed) ?Restrictions ?Weight Bearing Restrictions: No  ? ?  ? ?Mobility ? Bed Mobility ?Overal bed mobility: Modified Independent ?  ?  ?  ?  ?  ?  ?General bed mobility comments: with use of bed rails but flat bed ?  ? ?Transfers ?Overall transfer level: Modified independent ?Equipment used: Rolling Kaelon Weekes (2 wheels) ?  ?  ?  ?  ?  ?  ?  ?General transfer comment: poor hand placement after being instructed to place  hands on bed ?  ? ?Ambulation/Gait ?Ambulation/Gait assistance: Modified independent (Device/Increase time) ?Gait Distance (Feet): 350 Feet ?Assistive device: Rolling Angalena Cousineau (2 wheels) ?Gait Pattern/deviations: Step-through pattern, Decreased stride length ?Gait velocity: decreased ?  ?  ?General Gait Details: prefers use of RW for support. Ambulated without AD for 3' but increased pain in low back ? ?Stairs ?  ?  ?  ?  ?  ? ?Wheelchair Mobility ?  ? ?Modified Rankin (Stroke Patients Only) ?  ? ?  ? ?Balance Overall balance assessment: Needs assistance ?Sitting-balance support: No upper extremity supported, Feet supported ?Sitting balance-Leahy Scale: Good ?  ?  ?Standing balance support: Bilateral upper extremity supported, Reliant on assistive device for balance ?Standing balance-Leahy Scale: Poor ?Standing balance comment: reliant on UE support ?  ?  ?  ?  ?  ?  ?  ?  ?  ?  ?  ?   ? ? ? ?Pertinent Vitals/Pain Pain Assessment ?Pain Assessment: 0-10 ?Pain Score: 3  ?Pain Location: back ?Pain Descriptors / Indicators: Grimacing ?Pain Intervention(s): Monitored during session  ? ? ?Home Living Family/patient expects to be discharged to:: Private residence ?Living Arrangements: Children;Other relatives (grandkids) ?Available Help at Discharge: Family ?Type of Home: House ?Home Access: Level entry ?  ?  ?  ?Home Layout: One level ?Home Equipment: None ?   ?  ?Prior Function Prior Level of Function : Independent/Modified Independent;Working/employed;Driving ?  ?  ?  ?  ?  ?  ?  ?  ?  ? ? ?Hand Dominance  ?   ? ?  ?Extremity/Trunk Assessment  ? Upper Extremity Assessment ?Upper Extremity Assessment: Defer to  OT evaluation ?  ? ?Lower Extremity Assessment ?Lower Extremity Assessment: Generalized weakness ?  ? ?Cervical / Trunk Assessment ?Cervical / Trunk Assessment: Back Surgery  ?Communication  ? Communication: No difficulties  ?Cognition Arousal/Alertness: Awake/alert ?Behavior During Therapy: [facility] for tasks  assessed/performed ?Overall Cognitive Status: Within Functional Limits for tasks assessed ?  ?  ?  ?  ?  ?  ?  ?  ?  ?  ?  ?  ?  ?  ?  ?  ?  ?  ?  ? ?  ?General Comments   ? ?  ?Exercises    ? ?Assessment/Plan  ?  ?PT Assessment Patient does not need any further PT services  ?PT Problem List   ? ?   ?  ?PT Treatment Interventions     ? ?PT Goals (Current goals can be found in the Care Plan section)  ?Acute Rehab PT Goals ?Patient Stated Goal: to go home ?PT Goal Formulation: All assessment and education complete, DC therapy ? ?  ?Frequency   ?  ? ? ?Co-evaluation   ?  ?  ?  ?  ? ? ?  ?AM-PAC PT "6 Clicks" Mobility  ?Outcome Measure Help needed turning from your back to your side while in a flat bed without using bedrails?: None ?Help needed moving from lying on your back to sitting on the side of a flat bed without using bedrails?: None ?Help needed moving to and from a bed to a chair (including a wheelchair)?: None ?Help needed standing up from a chair using your arms (e.g., wheelchair or bedside chair)?: None ?Help needed to walk in hospital room?: None ?Help needed climbing 3-5 steps with a railing? : None ?6 Click Score: 24 ? ?  ?End of Session   ?Activity Tolerance: Patient tolerated treatment well ?Patient left: Other (comment) (up in room with OT) ?Nurse Communication: Mobility status ?PT Visit Diagnosis: Muscle weakness (generalized) (M62.81) ?  ? ?Time: 0998-3382 ?PT Time Calculation (min) (ACUTE ONLY): 15 min ? ? ?Charges:   PT Evaluation ?$PT Eval Low Complexity: 1 Low ?  ?  ?   ? ? ?Ruchi Stoney A. Gilford Rile, PT, DPT ?Acute Rehabilitation Services ?Pager [phone] ?Office [phone] ? ? ?Treveon Bourcier A Ayleah Hofmeister ?04/12/2021, 8:51 AM ? ?

## 2021-04-12 NOTE — Progress Notes (Signed)
I attempted multiple times to get a hold of the patient's transplant team at [facility].  I did not receive any call back despite leaving multiple messages. ? ?I discussed with the pharmacist here, given the risk of wound issues, recommend holding Myfortic, which I will hold for a total of 3 days.  She will continue her Prograf.  I discussed this with the patient, she states she had previously held the Myfortic for COVID and possibly her shoulder surgery. ? ? ?

## 2021-04-12 NOTE — Plan of Care (Signed)

## 2021-04-12 NOTE — Discharge Summary (Signed)
?Physician Discharge Summary  ?Patient ID: ?Kristen Moreno ?MRN: [id number] ?DOB/AGE: 1960-12-11 61 y.o. ? ?Admit date: 04/11/2021 ?Discharge date: 04/12/2021 ? ?Admission Diagnoses:  ?L4-5 degenerative spondylolisthesis with severe stenosis ? ?Discharge Diagnoses:  ?Same ?Principal Problem: ?  Spondylolisthesis, lumbar region ? ? ?Discharged Condition: Stable ? ?Hospital Course:  ?Kristen Moreno is a 61 y.o. female underwent elective L4-5 decompression, instrumentation and fusion on 04/11/2021.  She tolerated the surgery well.  Postoperatively her pain was controlled on oral medication, she was having normal bowel and bladder function and was ambulating independently.  Her preoperative radicular symptoms were improved.  She was tolerating a normal diet.  Her wound was clean dry and intact ? ?Treatments: Surgery -L4-5 minimally invasive decompression and TLIF ? ?Discharge Exam: ?Blood pressure (!) 110/58, pulse 79, temperature 99.7 ?F (37.6 ?C), temperature source Oral, resp. rate 16, height '5\' 6"'$  (1.676 m), weight 88.9 kg, SpO2 93 %. ?Awake, alert, oriented x3 ?PERRL ?Speech fluent, appropriate ?CN grossly intact ?5/5 BUE/BLE ?Wound c/d/I ?SILT ? ?Disposition: Discharge disposition: 01-Home or Self Care ? ? ? ? ? ? ?Discharge Instructions   ? ? Incentive spirometry RT   Complete by: As directed ?  ? ?  ? ?Allergies as of 04/12/2021   ? ?   Reactions  ? Lisinopril Cough  ? ?  ? ?  ?Medication List  ?  ? ?TAKE these medications   ? ?acetaminophen 500 MG tablet ?Commonly known as: TYLENOL ?Take 1,500-2,000 mg by mouth daily as needed (pain). ?  ?albuterol 108 (90 Base) MCG/ACT inhaler ?Commonly known as: Proventil HFA ?Inhale 1-2 puffs into the lungs every 6 (six) hours as needed for wheezing or shortness of breath. ?  ?amLODipine 10 MG tablet ?Commonly known as: NORVASC ?Take 10 mg by mouth daily. ?  ?HYDROcodone-acetaminophen 5-325 MG tablet ?Commonly known as: NORCO/VICODIN ?Take 1 tablet by mouth every 4 (four)  hours as needed for moderate pain ((score 4 to 6)). ?  ?Magnesium 250 MG Tabs ?Take 250 mg by mouth 2 (two) times daily. ?  ?methocarbamol 500 MG tablet ?Commonly known as: ROBAXIN ?Take 1 tablet (500 mg total) by mouth every 6 (six) hours as needed for muscle spasms. ?  ?mycophenolate 180 MG EC tablet ?Commonly known as: MYFORTIC ?Take 3 tablets (540 mg total) by mouth 2 (two) times daily. ?Start taking on: April 15, 2021 ?What changed: These instructions start on April 15, 2021. If you are unsure what to do until then, ask your doctor or other care provider. ?  ?omeprazole 20 MG capsule ?Commonly known as: PRILOSEC ?Take 1 capsule by mouth once daily ?  ?predniSONE 5 MG tablet ?Commonly known as: DELTASONE ?Take 5 mg by mouth daily with breakfast. ?  ?Ricola Herb 1.1 MG Lozg ?Generic drug: Menthol ?Use as directed 1 lozenge in the mouth or throat daily as needed (cough). ?  ?sertraline 25 MG tablet ?Commonly known as: Zoloft ?Take 1 tablet (25 mg total) by mouth daily. ?  ?sodium bicarbonate 650 MG tablet ?Take 1 tablet (650 mg total) by mouth 3 (three) times daily. ?  ?sulfamethoxazole-trimethoprim 400-80 MG tablet ?Commonly known as: BACTRIM ?Take 1 tablet by mouth every Monday, Wednesday, and Friday. ?  ?tacrolimus 1 MG capsule ?Commonly known as: PROGRAF ?Take 8 mg by mouth 2 (two) times daily. ?  ? ?  ? ? Follow-up Information   ? ? Wilson Dusenbery C, DO Follow up in 3 week(s).   ?Contact information: ?[address] ?[address] ?[place] [address] ?[phone] ? ? ?  ?  ? ?  ?  ? ?  ? ? ? ? ?Signed: ?Theodoro Doing Jade Burkard ?04/12/2021, 7:11 AM ? ? ? ?

## 2021-04-12 NOTE — Discharge Instructions (Signed)
Okay to restart Myfortic 3 days postop ?Continue Prograf ?Shower in 3 days, no submerging the wound in bathtub or pool ?Let soap and water run over the wound, do not scrub, pat dry and leave open to air ?

## 2021-04-15 ENCOUNTER — Encounter (HOSPITAL_COMMUNITY): Payer: Self-pay | Admitting: Neurological Surgery

## 2021-04-25 ENCOUNTER — Ambulatory Visit (INDEPENDENT_AMBULATORY_CARE_PROVIDER_SITE_OTHER): Payer: BC Managed Care – PPO | Admitting: Student

## 2021-04-25 DIAGNOSIS — M4316 Spondylolisthesis, lumbar region: Secondary | ICD-10-CM | POA: Diagnosis not present

## 2021-04-25 DIAGNOSIS — Z940 Kidney transplant status: Secondary | ICD-10-CM | POA: Diagnosis not present

## 2021-04-25 NOTE — Assessment & Plan Note (Signed)
Was advised to increase her prograf level to '8mg'$  BID by Dr. Reatha Armour (Neurosurgery) following her surgery on 04/11/2021. Spoke with Dr. Posey Pronto who advised to decrease her level back down to '7mg'$  BID. He will schedule a follow up appointment at [facility] and order a prograf level for patient as well. ? ?Plan: ?-decrease prograf to '7mg'$  BID ?-continue myfortic '540mg'$  BID and prednisone '5mg'$  daily ?-obtain prograf level and f/u with Dr. Posey Pronto  ?

## 2021-04-25 NOTE — Assessment & Plan Note (Signed)
She underwent elective minimally invasive L4-L5 decompression, instrumentation, and fusion at that time by Dr. Reatha Armour for L4-L5 degenerative spondylolisthesis with severe stenosis. She states that she has been doing well since the surgery. ?
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
## 2021-04-30 NOTE — Progress Notes (Signed)
Internal Medicine Clinic Attending  Case discussed with Dr. Jinwala  At the time of the visit.  We reviewed the resident's history and exam and pertinent patient test results.  I agree with the assessment, diagnosis, and plan of care documented in the resident's note.  

## 2021-05-13 ENCOUNTER — Other Ambulatory Visit: Payer: Self-pay | Admitting: Internal Medicine

## 2021-05-13 DIAGNOSIS — K2270 Barrett's esophagus without dysplasia: Secondary | ICD-10-CM

## 2021-07-12 DIAGNOSIS — K219 Gastro-esophageal reflux disease without esophagitis: Secondary | ICD-10-CM | POA: Insufficient documentation

## 2021-07-31 ENCOUNTER — Encounter: Payer: Self-pay | Admitting: Student

## 2021-07-31 ENCOUNTER — Ambulatory Visit (INDEPENDENT_AMBULATORY_CARE_PROVIDER_SITE_OTHER): Payer: BC Managed Care – PPO | Admitting: Student

## 2021-07-31 VITALS — BP 132/66 | HR 76 | Wt 195.8 lb

## 2021-07-31 DIAGNOSIS — M7022 Olecranon bursitis, left elbow: Secondary | ICD-10-CM | POA: Diagnosis not present

## 2021-07-31 DIAGNOSIS — E119 Type 2 diabetes mellitus without complications: Secondary | ICD-10-CM | POA: Diagnosis not present

## 2021-07-31 DIAGNOSIS — K2270 Barrett's esophagus without dysplasia: Secondary | ICD-10-CM | POA: Diagnosis not present

## 2021-07-31 DIAGNOSIS — M722 Plantar fascial fibromatosis: Secondary | ICD-10-CM | POA: Diagnosis not present

## 2021-07-31 LAB — POCT GLYCOSYLATED HEMOGLOBIN (HGB A1C): Hemoglobin A1C: 6.8 % — AB (ref 4.0–5.6)

## 2021-07-31 LAB — GLUCOSE, CAPILLARY: Glucose-Capillary: 116 mg/dL — ABNORMAL HIGH (ref 70–99)

## 2021-07-31 MED ORDER — OMEPRAZOLE 20 MG PO CPDR: 20.0000 mg | DELAYED_RELEASE_CAPSULE | Freq: Every day | ORAL | 0 refills | Status: DC
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
## 2021-07-31 NOTE — Assessment & Plan Note (Signed)
Patient reports right plantar foot pain for at least 4-5 months. Intermittent pain is sharp and shooting that radiates to ankle. Denies any numbness or weakness.  She is able to walk normally. Pain medication helps. On exam, some tenderness along the plantar side, non-swelling with normal ROM. Noted for flat feet bilaterally.   Plan -OTC splint for overnight use to help stretch the foot -provided exercise printouts  -take tylenol for the pain (pt s/p kidney transplant was already aware of avoiding long-term NSAID use)

## 2021-07-31 NOTE — Assessment & Plan Note (Addendum)
Lab Results  Component Value Date   HGBA1C 6.8 (A) 07/31/2021   A1c today was better than previous, 7.6 in January.  She is not on any medications. Discussed routine diabetic eye exam. Foot exam today was unremarkable.  Plan -Referral to ophthalmology for diabetic eye exam -completed foot exam today

## 2021-07-31 NOTE — Patient Instructions (Addendum)
Thank you, Ms.Love for allowing [place] to provide your care today. Today we discussed:  -Diabetes: A1c 6.8 today. Discussed dietary and physical activity changes. Referral for diabetic eye exam.  -R Foot Pain: Plantar Fasciitis- over-the-counter foot splint at night should help with the pain. Attached are some exercises to help.  -L Elbow Pain: You may use a wrap to help with the swelling.      I have ordered the following labs for you:  Lab Orders         Glucose, capillary         POC Hbg A1C      Referrals ordered today:   Referral Orders         Ambulatory referral to Ophthalmology       I have ordered the following medication/changed the following medications:   Stop the following medications: There are no discontinued medications.   Start the following medications: No orders of the defined types were placed in this encounter.    Follow up:  3 months     Should you have any questions or concerns please call the internal medicine clinic at [phone].    Angelique Blonder, D.O. [place]
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
## 2021-08-01 NOTE — Progress Notes (Signed)
Internal Medicine Clinic Attending  I saw and evaluated the patient.  I personally confirmed the key portions of the history and exam documented by Dr. Zheng and I reviewed pertinent patient test results.  The assessment, diagnosis, and plan were formulated together and I agree with the documentation in the resident's note.  

## 2021-09-29 IMAGING — CT CT ABD-PELV W/O CM
2 of 5 series · 17 of 46 positions shown, 19 images · non-contrast
Comparison: 11/11/2018

CLINICAL DATA: Abdominal pain

EXAM:
CT ABDOMEN AND PELVIS WITHOUT CONTRAST
TECHNIQUE: Multidetector CT imaging of the abdomen and pelvis was performed
following the standard protocol without IV contrast.

[Series 4: thins · axial · 0.85mm/px · z∈[+841,+1295]mm · 14 of 702 slices shown, 16 images]
[im 27/702  soft-tissue]
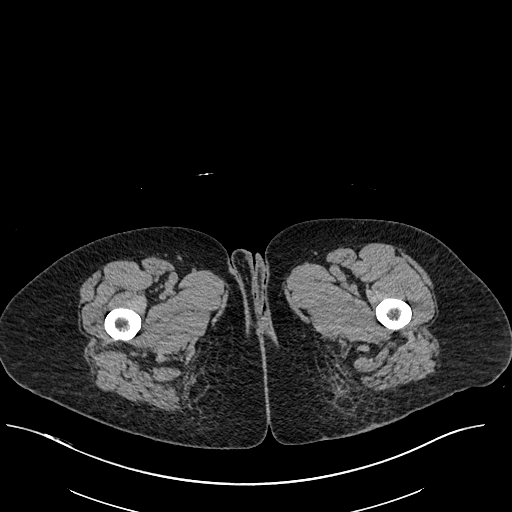
[im 27/702  bone]
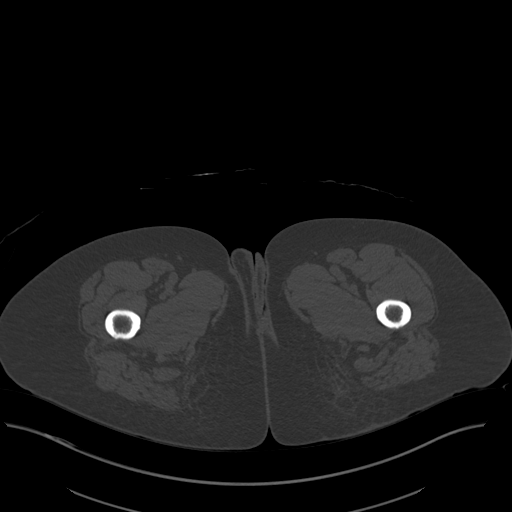
[im 81/702  soft-tissue]
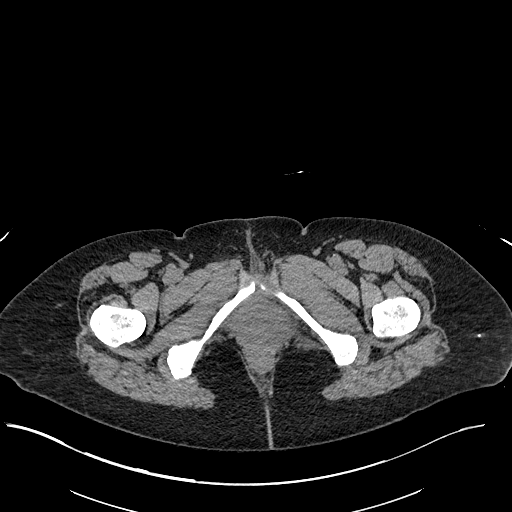
[im 135/702  soft-tissue]
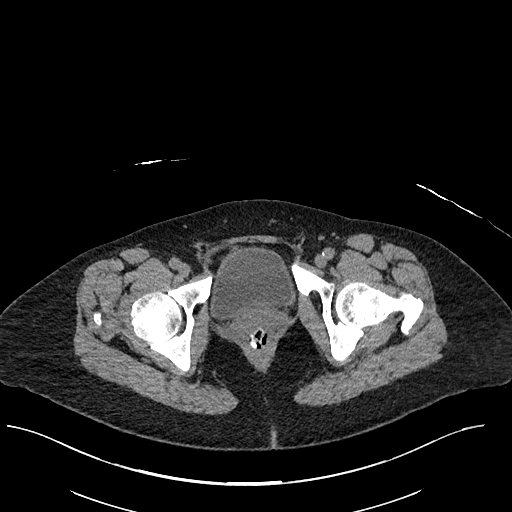
[im 189/702  soft-tissue]
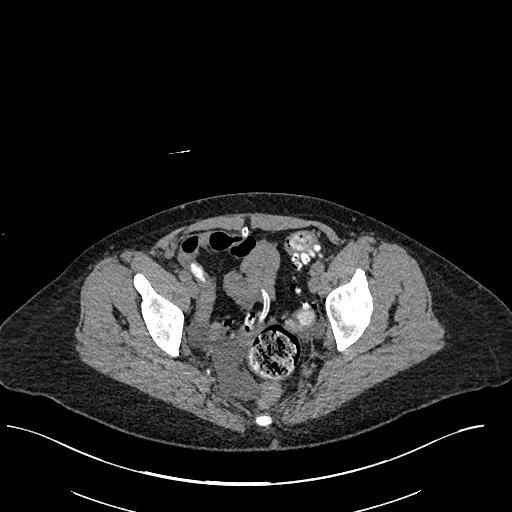
[im 243/702  soft-tissue]
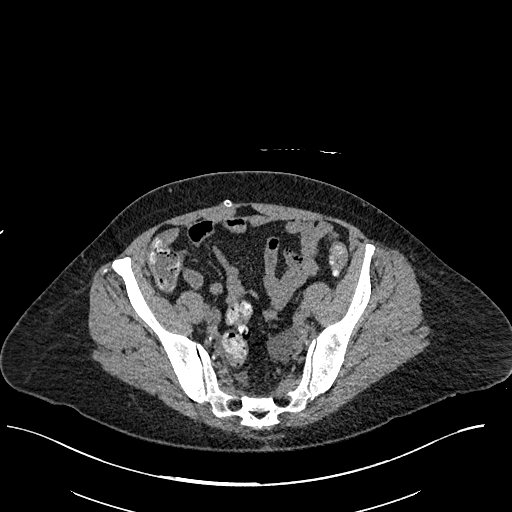
[im 270/702  soft-tissue]
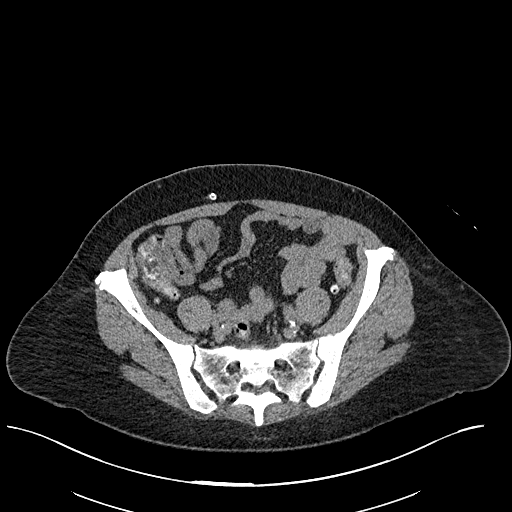
[im 324/702  soft-tissue]
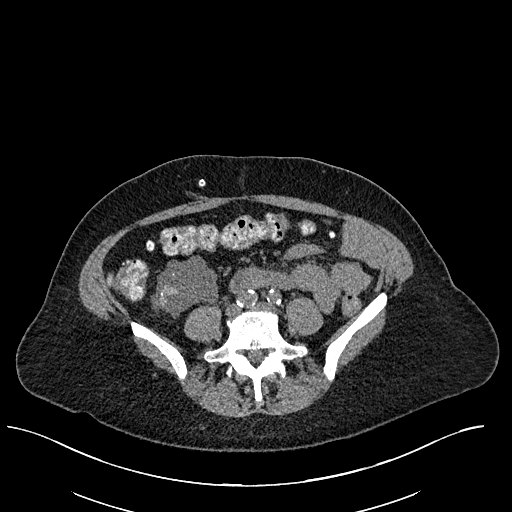
[im 378/702  soft-tissue]
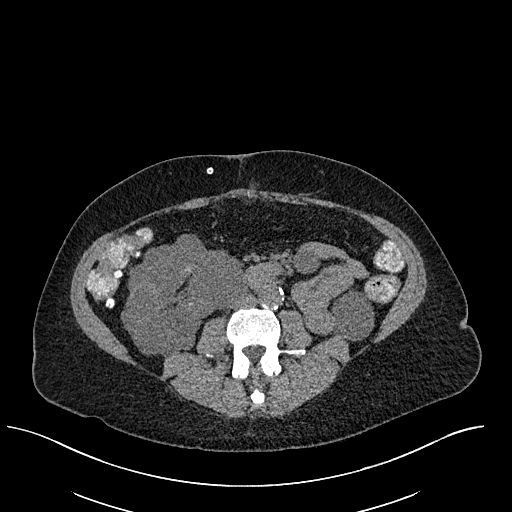
[im 432/702  soft-tissue]
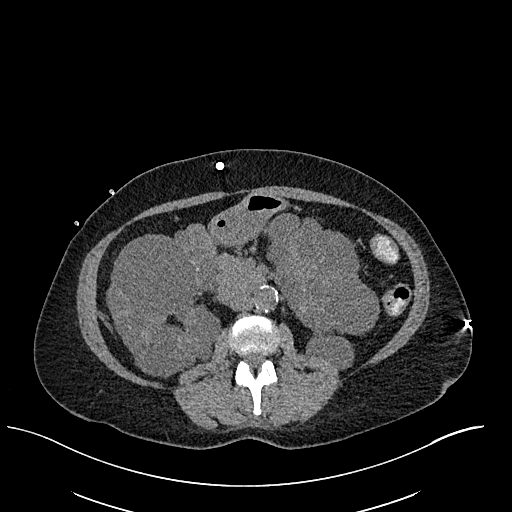
[im 432/702  bone]
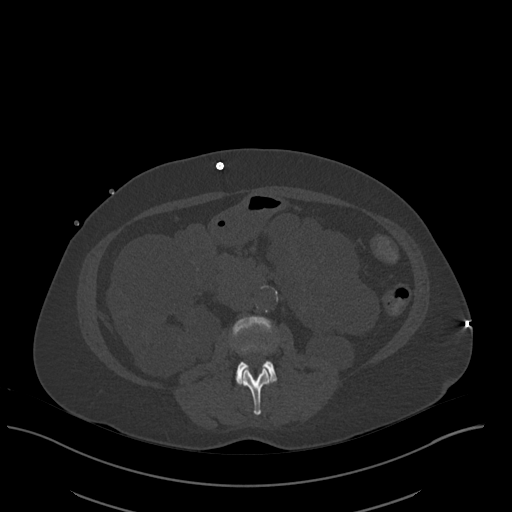
[im 459/702  soft-tissue]
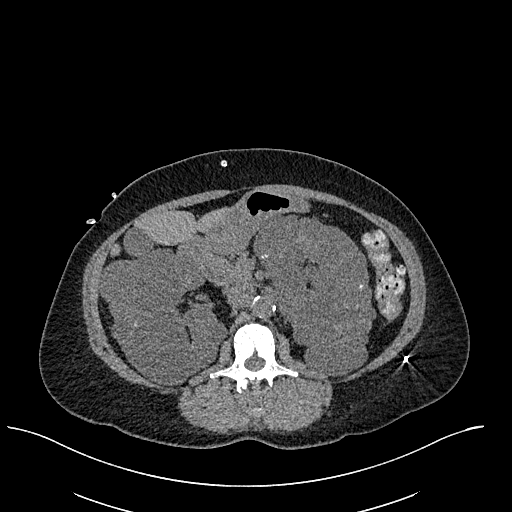
[im 513/702  soft-tissue]
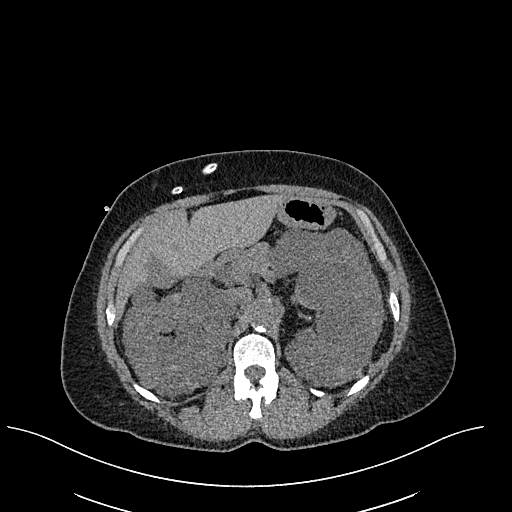
[im 567/702  soft-tissue]
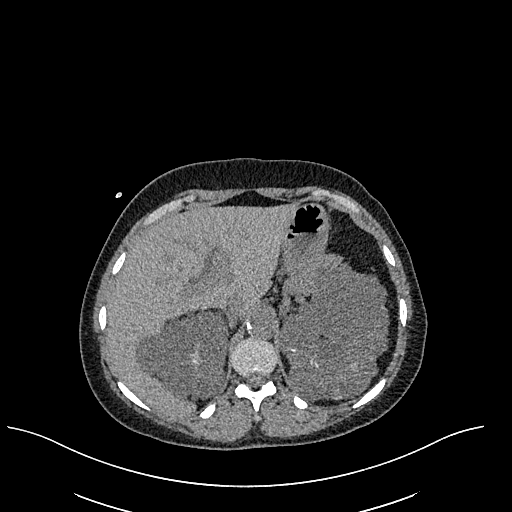
[im 621/702  soft-tissue]
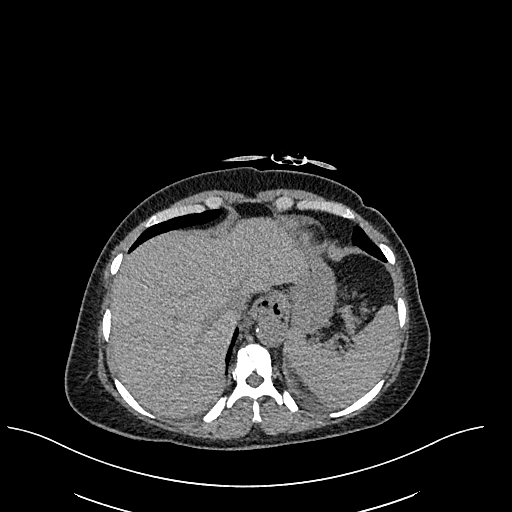
[im 675/702  soft-tissue]
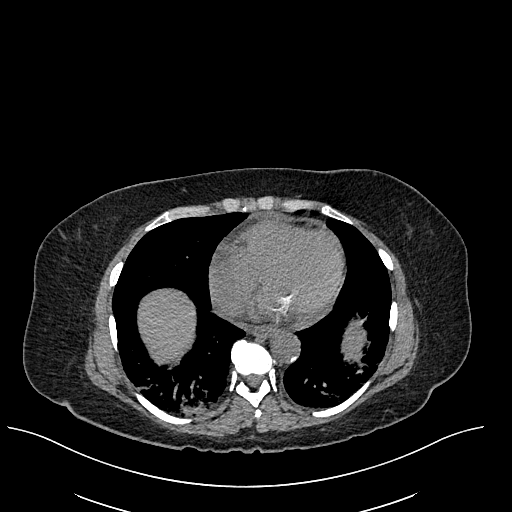

[Series 6: cor · coronal · 0.93mm/px · 3 of 104 slices shown]
[im 35/104  soft-tissue]
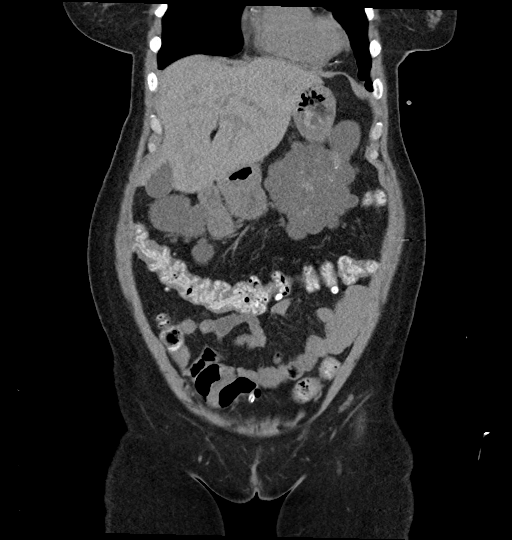
[im 46/104  soft-tissue]
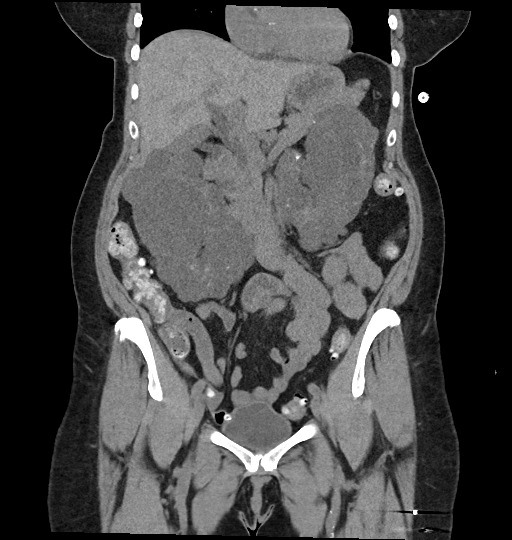
[im 58/104  soft-tissue]
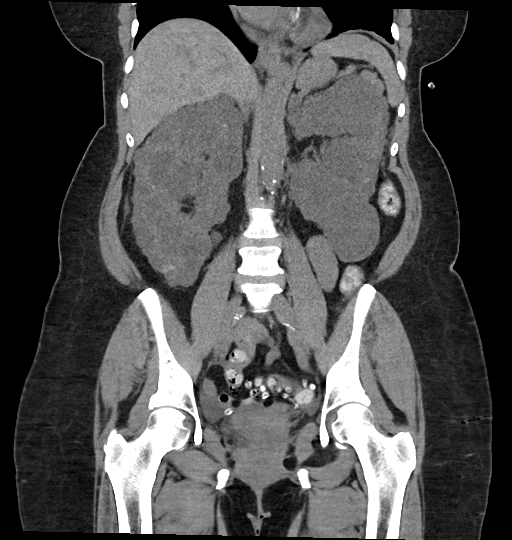

[17 of 46 positions shown; findings below may reference images not displayed]

FINDINGS: LOWER CHEST: Bibasilar atelectasis. Trace pericardial effusion,
unchanged

HEPATOBILIARY: Normal hepatic contours. No intra- or extrahepatic
biliary dilatation. Normal gallbladder.

PANCREAS: Normal pancreas. No ductal dilatation or peripancreatic
fluid collection.

SPLEEN: Normal.

ADRENALS/URINARY TRACT: The adrenal glands are normal. Innumerable
bilateral renal cysts with little to no normal renal parenchyma.
Multiple small hyperdense cysts are also unchanged. The urinary
bladder is normal for degree of distention peritoneal dialysis
catheter is coiled in the low pelvis.

STOMACH/BOWEL: There is no hiatal hernia. Normal duodenal course and
caliber. No small bowel dilatation or inflammation. Colonic
diverticulosis without acute inflammation. Normal appendix.

VASCULAR/LYMPHATIC: There is calcific atherosclerosis of the
abdominal aorta. No abdominal or pelvic lymphadenopathy.

REPRODUCTIVE: Status post hysterectomy. No adnexal mass.

MUSCULOSKELETAL. No bony spinal canal stenosis or focal osseous
abnormality.

OTHER: None.
IMPRESSION: 1. No acute abnormality of the abdomen or pelvis.
2. Unchanged appearance of polycystic kidney disease.
3. Aortic Atherosclerosis ([id number].M).

## 2021-10-01 ENCOUNTER — Other Ambulatory Visit: Payer: Self-pay | Admitting: Student

## 2021-10-01 DIAGNOSIS — K2270 Barrett's esophagus without dysplasia: Secondary | ICD-10-CM

## 2021-11-10 ENCOUNTER — Emergency Department (HOSPITAL_COMMUNITY): Payer: BC Managed Care – PPO

## 2021-11-10 ENCOUNTER — Emergency Department (HOSPITAL_COMMUNITY)
Admission: EM | Admit: 2021-11-10 | Discharge: 2021-11-10 | Disposition: A | Discharge status: Home / Self Care | Payer: BC Managed Care – PPO | Attending: Emergency Medicine | Admitting: Emergency Medicine

## 2021-11-10 ENCOUNTER — Encounter (HOSPITAL_COMMUNITY): Payer: Self-pay | Admitting: Emergency Medicine

## 2021-11-10 DIAGNOSIS — N950 Postmenopausal bleeding: Secondary | ICD-10-CM

## 2021-11-10 DIAGNOSIS — N939 Abnormal uterine and vaginal bleeding, unspecified: Secondary | ICD-10-CM

## 2021-11-10 DIAGNOSIS — E119 Type 2 diabetes mellitus without complications: Secondary | ICD-10-CM | POA: Insufficient documentation

## 2021-11-10 DIAGNOSIS — R102 Pelvic and perineal pain: Secondary | ICD-10-CM | POA: Insufficient documentation

## 2021-11-10 DIAGNOSIS — Z79899 Other long term (current) drug therapy: Secondary | ICD-10-CM | POA: Diagnosis not present

## 2021-11-10 DIAGNOSIS — I10 Essential (primary) hypertension: Secondary | ICD-10-CM | POA: Insufficient documentation

## 2021-11-10 LAB — CBC WITH DIFFERENTIAL/PLATELET
Abs Immature Granulocytes: 0.02 10*3/uL (ref 0.00–0.07)
Basophils Absolute: 0 10*3/uL (ref 0.0–0.1)
Basophils Relative: 1 %
Eosinophils Absolute: 0.2 10*3/uL (ref 0.0–0.5)
Eosinophils Relative: 3 %
HCT: 41.5 % (ref 36.0–46.0)
Hemoglobin: 13 g/dL (ref 12.0–15.0)
Immature Granulocytes: 0 %
Lymphocytes Relative: 16 %
Lymphs Abs: 1 10*3/uL (ref 0.7–4.0)
MCH: 30 pg (ref 26.0–34.0)
MCHC: 31.3 g/dL (ref 30.0–36.0)
MCV: 95.8 fL (ref 80.0–100.0)
Monocytes Absolute: 0.6 10*3/uL (ref 0.1–1.0)
Monocytes Relative: 9 %
Neutro Abs: 4.4 10*3/uL (ref 1.7–7.7)
Neutrophils Relative %: 71 %
Platelets: 264 10*3/uL (ref 150–400)
RBC: 4.33 MIL/uL (ref 3.87–5.11)
RDW: 13.8 % (ref 11.5–15.5)
WBC: 6.2 10*3/uL (ref 4.0–10.5)
nRBC: 0 % (ref 0.0–0.2)

## 2021-11-10 LAB — COMPREHENSIVE METABOLIC PANEL
ALT: 15 U/L (ref 0–44)
AST: 24 U/L (ref 15–41)
Albumin: 3.8 g/dL (ref 3.5–5.0)
Alkaline Phosphatase: 55 U/L (ref 38–126)
Anion gap: 10 (ref 5–15)
BUN: 17 mg/dL (ref 6–20)
CO2: 23 mmol/L (ref 22–32)
Calcium: 9.1 mg/dL (ref 8.9–10.3)
Chloride: 109 mmol/L (ref 98–111)
Creatinine, Ser: 1.25 mg/dL — ABNORMAL HIGH (ref 0.44–1.00)
GFR, Estimated: 50 mL/min — ABNORMAL LOW (ref >60)
Glucose, Bld: 124 mg/dL — ABNORMAL HIGH (ref 70–99)
Potassium: 4.9 mmol/L (ref 3.5–5.1)
Sodium: 142 mmol/L (ref 135–145)
Total Bilirubin: 0.8 mg/dL (ref 0.3–1.2)
Total Protein: 7.4 g/dL (ref 6.5–8.1)

## 2021-11-10 LAB — URINALYSIS, ROUTINE W REFLEX MICROSCOPIC
Glucose, UA: NEGATIVE mg/dL
Ketones, ur: NEGATIVE mg/dL
Nitrite: NEGATIVE
Protein, ur: 30 mg/dL — AB
Specific Gravity, Urine: 1.02 (ref 1.005–1.030)
pH: 5.5 (ref 5.0–8.0)

## 2021-11-10 LAB — URINALYSIS, MICROSCOPIC (REFLEX): RBC / HPF: >50 RBC/hpf (ref 0–5)

## 2021-11-10 LAB — LIPASE, BLOOD: Lipase: 26 U/L (ref 11–51)

## 2021-11-10 MED ORDER — MORPHINE SULFATE (PF) 4 MG/ML IV SOLN
4.0000 mg | Freq: Once | INTRAVENOUS | Status: AC
  Administered 2021-11-10: 4 mg via INTRAVENOUS
  Filled 2021-11-10: qty 1

## 2021-11-10 MED ORDER — HYDROMORPHONE HCL 1 MG/ML IJ SOLN
0.5000 mg | Freq: Once | INTRAMUSCULAR | Status: AC
  Administered 2021-11-10: 0.5 mg via INTRAVENOUS
  Filled 2021-11-10: qty 1

## 2021-11-10 MED ORDER — IOHEXOL 300 MG/ML  SOLN
100.0000 mL | Freq: Once | INTRAMUSCULAR | Status: AC | PRN
  Administered 2021-11-10: 100 mL via INTRAVENOUS

## 2021-11-10 MED ORDER — ONDANSETRON HCL 4 MG/2ML IJ SOLN
4.0000 mg | Freq: Once | INTRAMUSCULAR | Status: AC
  Administered 2021-11-10: 4 mg via INTRAVENOUS
  Filled 2021-11-10: qty 2

## 2021-11-10 MED ORDER — OXYCODONE-ACETAMINOPHEN 5-325 MG PO TABS: 1.0000 | ORAL_TABLET | Freq: Four times a day (QID) | ORAL | 0 refills | Status: DC | PRN

## 2021-11-10 MED ORDER — MONSELS FERRIC SUBSULFATE EX SOLN
Freq: Once | CUTANEOUS | Status: AC
  Filled 2021-11-10: qty 8

## 2021-11-10 NOTE — ED Triage Notes (Signed)
Pt reports vaginal bleeding and lower abdominal pain that started tonight after having sex. States that she heard a "pop." States that pain is worsening and she is starting to feel nauseous.

## 2021-11-10 NOTE — ED Provider Notes (Signed)
[facility] DEPT Provider Note   CSN: [id number] Arrival date & time: 11/10/21  0047     History  Chief Complaint  Patient presents with   Vaginal Bleeding    Kristen Moreno is a 61 y.o. female.  The history is provided by the patient.  Vaginal Bleeding She has history of hypertension, diabetes, hyperlipidemia, renal transplant, total vaginal hysterectomy and comes in with pelvic pain and vaginal bleeding following sexual intercourse last night.  She felt something pop, and started having bleeding including clots.  She has never had anything like this before.  She is not on any anticoagulants or antiplatelet agents.   Home Medications Prior to Admission medications   Medication Sig Start Date End Date Taking? Authorizing Provider  acetaminophen (TYLENOL) 500 MG tablet Take 1,500-2,000 mg by mouth daily as needed (pain).    [provider]  albuterol (PROVENTIL HFA) 108 (90 Base) MCG/ACT inhaler Inhale 1-2 puffs into the lungs every 6 (six) hours as needed for wheezing or shortness of breath. 01/24/20   Bloomfield, Carley D, DO  amLODipine (NORVASC) 10 MG tablet Take 10 mg by mouth daily. 11/29/19   [provider]  HYDROcodone-acetaminophen (NORCO/VICODIN) 5-325 MG tablet Take 1 tablet by mouth every 4 (four) hours as needed for moderate pain ((score 4 to 6)). 04/12/21   Dawley, Troy C, DO  Magnesium 250 MG TABS Take 250 mg by mouth 2 (two) times daily.    [provider]  Menthol (RICOLA HERB) 1.1 MG LOZG Use as directed 1 lozenge in the mouth or throat daily as needed (cough).    [provider]  methocarbamol (ROBAXIN) 500 MG tablet Take 1 tablet (500 mg total) by mouth every 6 (six) hours as needed for muscle spasms. 04/12/21   Dawley, Troy C, DO  mycophenolate (MYFORTIC) 180 MG EC tablet Take 3 tablets (540 mg total) by mouth 2 (two) times daily. 04/15/21   Dawley, Theodoro Doing, DO  omeprazole (PRILOSEC) 20 MG capsule  Take 1 capsule by mouth once daily 10/03/21   Linus Galas, MD  predniSONE (DELTASONE) 5 MG tablet Take 5 mg by mouth daily with breakfast.    [provider]  sertraline (ZOLOFT) 25 MG tablet Take 1 tablet (25 mg total) by mouth daily. 01/23/21 01/23/22  Delene Ruffini, MD  sodium bicarbonate 650 MG tablet Take 1 tablet (650 mg total) by mouth 3 (three) times daily. 01/31/21 01/31/22  Delene Ruffini, MD  sulfamethoxazole-trimethoprim (BACTRIM) 400-80 MG tablet Take 1 tablet by mouth every Monday, Wednesday, and Friday. 07/25/20   [provider]  tacrolimus (PROGRAF) 1 MG capsule Take 8 mg by mouth 2 (two) times daily.    [provider]      Allergies    Lisinopril    Review of Systems   Review of Systems  Genitourinary:  Positive for vaginal bleeding.  All other systems reviewed and are negative.   Physical Exam Updated Vital Signs BP (!) 153/84   Pulse 81   Temp 98.2 F (36.8 C) (Oral)   Resp 18   SpO2 95%  Physical Exam Vitals and nursing note reviewed.   61 year old female, obviously uncomfortable, but in no acute distress. Vital signs are significant for elevated blood pressure. Oxygen saturation is 95%, which is normal. Head is normocephalic and atraumatic. PERRLA, EOMI. Oropharynx is clear. Neck is nontender and supple without adenopathy or JVD. Back is nontender and there is no CVA tenderness. Lungs are clear without  rales, wheezes, or rhonchi. Chest is nontender. Heart has regular rate and rhythm without murmur. Abdomen is soft, flat, with moderate tenderness across the lower abdomen.  There is no rebound or guarding. Pelvic: Deferred because patient is in too much pain to lay down and have pelvic exam done. Extremities have no cyanosis or edema, full range of motion is present. Skin is warm and dry without rash. Neurologic: Mental status is normal, cranial nerves are intact, moves all extremities equally.  ED Results / Procedures  / Treatments   Labs (all labs ordered are listed, but only abnormal results are displayed) Labs Reviewed  COMPREHENSIVE METABOLIC PANEL  LIPASE, BLOOD  CBC WITH DIFFERENTIAL/PLATELET  URINALYSIS, ROUTINE W REFLEX MICROSCOPIC   Radiology No results found.  Procedures Procedures    Medications Ordered in ED Medications  morphine (PF) 4 MG/ML injection 4 mg (has no administration in time range)  ondansetron (ZOFRAN) injection 4 mg (has no administration in time range)    ED Course/ Medical Decision Making/ A&P                           Medical Decision Making Amount and/or Complexity of Data Reviewed Labs: ordered. Radiology: ordered.  Risk Prescription drug management.   Pelvic pain and vaginal bleeding post sexual intercourse in patient who is postmenopausal and status post hysterectomy.  I wonder if there may have been some injury to her surgical suture line.  I have ordered screening labs of CBC, comprehensive metabolic panel, lipase, urinalysis.  I have ordered morphine for pain.  At the end of my shift, patient had not received her morphine, I have not been able to complete her pelvic exam.  I have ordered a CT of abdomen and pelvis to look for evidence of possible perforation.  Case is signed out to Dr. Roderic Palau to evaluate labs, CT scan, and perform pelvic exam if necessary.  Final Clinical Impression(s) / ED Diagnoses Final diagnoses:  Post-menopausal bleeding  Pelvic pain  Elevated blood pressure reading with diagnosis of hypertension    Rx / DC Orders ED Discharge Orders     None         Delora Fuel, MD 19/50/93 [phone]

## 2021-11-10 NOTE — ED Provider Notes (Signed)
Patient with vaginal bleeding.  Pelvic exam was done and it appears that she has a laceration inside her vagina.  Bleeding is controlled but significant clots are seen.  I spoke with OB/GYN and we are going to put a monsol soaked tampon in for 4 to 6 hours.  CT scan was unremarkable.  She will follow-up with OB/GYN   Milton Ferguson, MD 11/10/21 1234

## 2021-11-10 NOTE — Discharge Instructions (Addendum)
Follow-up with an OB/GYN doctor this week.   Take Tylenol or Motrin for pain.  If that does not work you can use the Percocet.  Also make sure you take that tampon out after 4 to 6 hours

## 2021-11-29 ENCOUNTER — Ambulatory Visit
Admission: EM | Admit: 2021-11-29 | Discharge: 2021-11-29 | Disposition: A | Discharge status: Home / Self Care | Payer: BC Managed Care – PPO | Attending: Physician Assistant | Admitting: Physician Assistant

## 2021-11-29 DIAGNOSIS — J441 Chronic obstructive pulmonary disease with (acute) exacerbation: Secondary | ICD-10-CM

## 2021-11-29 MED ORDER — AZITHROMYCIN 250 MG PO TABS: 250.0000 mg | ORAL_TABLET | Freq: Every day | ORAL | 0 refills | Status: DC

## 2021-11-29 MED ORDER — PREDNISONE 20 MG PO TABS: 40.0000 mg | ORAL_TABLET | Freq: Every day | ORAL | 0 refills | Status: AC

## 2021-11-29 NOTE — ED Triage Notes (Signed)
Pt presents with productive cough with colored mucus that is causing generalized chest discomfort and shortness of breath X 1 week.
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
## 2021-12-02 ENCOUNTER — Emergency Department (HOSPITAL_COMMUNITY): Payer: BC Managed Care – PPO

## 2021-12-02 ENCOUNTER — Other Ambulatory Visit: Payer: Self-pay

## 2021-12-02 ENCOUNTER — Encounter (HOSPITAL_COMMUNITY): Payer: Self-pay

## 2021-12-02 ENCOUNTER — Emergency Department (HOSPITAL_COMMUNITY)
Admission: EM | Admit: 2021-12-02 | Discharge: 2021-12-03 | Disposition: A | Discharge status: Home / Self Care | Payer: BC Managed Care – PPO | Attending: Emergency Medicine | Admitting: Emergency Medicine

## 2021-12-02 DIAGNOSIS — Z992 Dependence on renal dialysis: Secondary | ICD-10-CM | POA: Diagnosis not present

## 2021-12-02 DIAGNOSIS — J45909 Unspecified asthma, uncomplicated: Secondary | ICD-10-CM | POA: Insufficient documentation

## 2021-12-02 DIAGNOSIS — I120 Hypertensive chronic kidney disease with stage 5 chronic kidney disease or end stage renal disease: Secondary | ICD-10-CM | POA: Diagnosis not present

## 2021-12-02 DIAGNOSIS — J40 Bronchitis, not specified as acute or chronic: Secondary | ICD-10-CM

## 2021-12-02 DIAGNOSIS — N186 End stage renal disease: Secondary | ICD-10-CM | POA: Insufficient documentation

## 2021-12-02 DIAGNOSIS — Z7951 Long term (current) use of inhaled steroids: Secondary | ICD-10-CM | POA: Diagnosis not present

## 2021-12-02 DIAGNOSIS — Z7952 Long term (current) use of systemic steroids: Secondary | ICD-10-CM | POA: Diagnosis not present

## 2021-12-02 DIAGNOSIS — Z8616 Personal history of COVID-19: Secondary | ICD-10-CM | POA: Diagnosis not present

## 2021-12-02 DIAGNOSIS — Z79899 Other long term (current) drug therapy: Secondary | ICD-10-CM | POA: Diagnosis not present

## 2021-12-02 DIAGNOSIS — E1122 Type 2 diabetes mellitus with diabetic chronic kidney disease: Secondary | ICD-10-CM | POA: Insufficient documentation

## 2021-12-02 DIAGNOSIS — J449 Chronic obstructive pulmonary disease, unspecified: Secondary | ICD-10-CM | POA: Diagnosis not present

## 2021-12-02 DIAGNOSIS — R059 Cough, unspecified: Secondary | ICD-10-CM | POA: Diagnosis present

## 2021-12-02 DIAGNOSIS — R0789 Other chest pain: Secondary | ICD-10-CM

## 2021-12-02 LAB — BASIC METABOLIC PANEL
Anion gap: 9 (ref 5–15)
BUN: 21 mg/dL — ABNORMAL HIGH (ref 6–20)
CO2: 24 mmol/L (ref 22–32)
Calcium: 9.2 mg/dL (ref 8.9–10.3)
Chloride: 108 mmol/L (ref 98–111)
Creatinine, Ser: 1.7 mg/dL — ABNORMAL HIGH (ref 0.44–1.00)
GFR, Estimated: 34 mL/min — ABNORMAL LOW (ref >60)
Glucose, Bld: 126 mg/dL — ABNORMAL HIGH (ref 70–99)
Potassium: 3.9 mmol/L (ref 3.5–5.1)
Sodium: 141 mmol/L (ref 135–145)

## 2021-12-02 LAB — CBC
HCT: 43.4 % (ref 36.0–46.0)
Hemoglobin: 13.6 g/dL (ref 12.0–15.0)
MCH: 29.8 pg (ref 26.0–34.0)
MCHC: 31.3 g/dL (ref 30.0–36.0)
MCV: 95.2 fL (ref 80.0–100.0)
Platelets: 255 10*3/uL (ref 150–400)
RBC: 4.56 MIL/uL (ref 3.87–5.11)
RDW: 14 % (ref 11.5–15.5)
WBC: 7.8 10*3/uL (ref 4.0–10.5)
nRBC: 0 % (ref 0.0–0.2)

## 2021-12-02 LAB — TROPONIN I (HIGH SENSITIVITY): Troponin I (High Sensitivity): 4 ng/L (ref <18)

## 2021-12-02 NOTE — ED Triage Notes (Signed)
Pt c/o sharp pain to her chest starting yesterday. Pt states she was seen at UC 3 days ago for cough, chest pain, and SOB that has continued to persist. Pt denies SOB at this moment, c/o pain to her chest that worsens with movement.

## 2021-12-02 NOTE — ED Provider Triage Note (Signed)
Emergency Medicine Provider Triage Evaluation Note  Kristen Moreno , a 60 y.o. female  was evaluated in triage.  Pt complains of chest pain and shortness of breath that has gotten worse since Friday.  The pain is located on central chest and radiates to right sided chest and right flank.  Pain is worse with positional changes.  Reports cough that has been gone for few weeks.  No fever.  History of COPD.  Review of Systems  Positive:  Negative:   Physical Exam  BP (!) 149/71   Pulse 81   Temp 98.3 F (36.8 C) (Oral)   Resp 18   SpO2 100%  Gen:   Awake, no distress   Resp:  Normal effort, wheezing MSK:   Moves extremities without difficulty  Other:    Medical Decision Making  Medically screening exam initiated at 4:52 PM.  Appropriate orders placed.  Kristen Moreno was informed that the remainder of the evaluation will be completed by another provider, this initial triage assessment does not replace that evaluation, and the importance of remaining in the ED until their evaluation is complete.     Rex Kras, [place] 12/03/21 [phone]

## 2021-12-03 MED ORDER — CYCLOBENZAPRINE HCL 5 MG PO TABS: 5.0000 mg | ORAL_TABLET | Freq: Every day | ORAL | 0 refills | Status: AC

## 2021-12-03 MED ORDER — GUAIFENESIN 100 MG/5ML PO LIQD: 100.0000 mg | ORAL | 0 refills | Status: DC | PRN
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
## 2021-12-03 NOTE — Discharge Instructions (Signed)
For your chest wall pain you may use over-the-counter Acetaminophen (Tylenol), topical muscle creams such as SalonPas, [employer], Bengay, etc. Please stretch, apply ice or heat (whichever helps), and have massage therapy for additional assistance.

## 2021-12-09 LAB — HM DIABETES EYE EXAM

## 2022-01-01 ENCOUNTER — Telehealth: Payer: Self-pay | Admitting: *Deleted

## 2022-01-01 ENCOUNTER — Ambulatory Visit: Payer: BC Managed Care – PPO | Admitting: Obstetrics and Gynecology

## 2022-01-01 NOTE — Telephone Encounter (Signed)
Call from pt requesting an appt ; informed no available appts this week nor next week. Stated she's having shob when coughing. She went the ER on 11/14 -she was told she has bronchitis , use her inhaler and see her PCP. Stated she has been using her inhaler. Suggested to pt t go to UC. Stated she went to UC before she went to the ER. She went to UC on 11/10. Please advise.

## 2022-01-02 ENCOUNTER — Encounter (HOSPITAL_COMMUNITY): Payer: Self-pay

## 2022-01-02 NOTE — Telephone Encounter (Signed)
Called pt - stated sob occurs when she gets up and move around. She's unable to check her O2 sats. Informed to go to UC or ER - stated she probably will.

## 2022-01-02 NOTE — Telephone Encounter (Signed)
Pt did not go back to UC. Appt became available on tomorrow - pt is able to come. Appt schedule w/Dr Dema Severin 12/15 @ 0845AM.

## 2022-01-03 ENCOUNTER — Ambulatory Visit (INDEPENDENT_AMBULATORY_CARE_PROVIDER_SITE_OTHER): Payer: BLUE CROSS/BLUE SHIELD

## 2022-01-03 ENCOUNTER — Encounter (HOSPITAL_COMMUNITY): Payer: Self-pay

## 2022-01-03 VITALS — BP 146/70 | HR 79 | Temp 98.0°F | Ht 66.0 in | Wt 198.8 lb

## 2022-01-03 DIAGNOSIS — Z87891 Personal history of nicotine dependence: Secondary | ICD-10-CM

## 2022-01-03 DIAGNOSIS — R0602 Shortness of breath: Secondary | ICD-10-CM | POA: Diagnosis not present

## 2022-01-03 DIAGNOSIS — J439 Emphysema, unspecified: Secondary | ICD-10-CM

## 2022-01-03 MED ORDER — TIOTROPIUM BROMIDE MONOHYDRATE 2.5 MCG/ACT IN AERS: 2.0000 | INHALATION_SPRAY | Freq: Every day | RESPIRATORY_TRACT | 2 refills | Status: AC

## 2022-01-03 MED ORDER — ALBUTEROL SULFATE HFA 108 (90 BASE) MCG/ACT IN AERS: 1.0000 | INHALATION_SPRAY | Freq: Four times a day (QID) | RESPIRATORY_TRACT | 2 refills | Status: AC | PRN

## 2022-01-03 NOTE — Assessment & Plan Note (Addendum)
Hx obstructive PFTs. Patient presents with about a month of worsening shortness of breath on exertion with accompanying chest pains with cough/deep inspiration but also simply with exertion. Takes albuterol inhaler with partial relief of symptoms. No infectious symptoms currently such as cough, fever, chills, mucus production. Heart and lung exam normal. No lower extremity edema. Normal stress test in 2021 but given re-emergence of exertional shortness of breath, warrants further cardiac evaluation.  Plan:  -referral to cardiology for possible stress test -add Spiriva multiple exacerbations this year and severity of symptoms -f/u in 2 months
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
## 2022-01-03 NOTE — Patient Instructions (Addendum)
Ms.Shynice E Mickel, it was a pleasure seeing you today!  Today we discussed: SOB- Start taking tiotroprium inhaler 2 puffs daily. I will also place a referral to cardiology. BP- take your blood pressure each morning and come back in 2 months for [place] to look over them   I have ordered the following labs today:  Lab Orders  No laboratory test(s) ordered today     Tests ordered today:  none  Referrals ordered today:   Referral Orders  No referral(s) requested today     I have ordered the following medication/changed the following medications:   Stop the following medications: Medications Discontinued During This Encounter  Medication Reason   albuterol (PROVENTIL HFA) 108 (90 Base) MCG/ACT inhaler Reorder     Start the following medications: Meds ordered this encounter  Medications   Tiotropium Bromide Monohydrate 2.5 MCG/ACT AERS    Sig: Inhale 2 puffs into the lungs daily.    Dispense:  4 each    Refill:  2   albuterol (PROVENTIL HFA) 108 (90 Base) MCG/ACT inhaler    Sig: Inhale 1-2 puffs into the lungs every 6 (six) hours as needed for wheezing or shortness of breath.    Dispense:  18 g    Refill:  2     Follow-up: 2 months   Please make sure to arrive 15 minutes prior to your next appointment. If you arrive late, you may be asked to reschedule.   We look forward to seeing you next time. Please call our clinic at [phone] if you have any questions or concerns. The best time to call is Monday-Friday from 9am-4pm, but there is someone available 24/7. If after hours or the weekend, call the main hospital number and ask for the Internal Medicine Resident On-Call. If you need medication refills, please notify your pharmacy one week in advance and they will send [place] a request.  Thank you for letting us take part in your care. Wishing you the best!  Thank you, Linward Natal, MD

## 2022-01-06 NOTE — Progress Notes (Signed)
Internal Medicine Clinic Attending   I saw and evaluated the patient.  I personally confirmed the key portions of the history and exam documented by Dr. White and I reviewed pertinent patient test results.  The assessment, diagnosis, and plan were formulated together and I agree with the documentation in the resident's note.  

## 2022-01-16 ENCOUNTER — Encounter: Payer: Self-pay | Admitting: Dietician

## 2022-01-17 ENCOUNTER — Ambulatory Visit: Payer: BLUE CROSS/BLUE SHIELD | Admitting: Cardiovascular Disease

## 2022-01-26 ENCOUNTER — Other Ambulatory Visit: Payer: Self-pay

## 2022-01-26 DIAGNOSIS — K2270 Barrett's esophagus without dysplasia: Secondary | ICD-10-CM

## 2022-01-30 ENCOUNTER — Other Ambulatory Visit: Payer: Self-pay

## 2022-01-30 DIAGNOSIS — K2270 Barrett's esophagus without dysplasia: Secondary | ICD-10-CM

## 2022-01-30 NOTE — Telephone Encounter (Signed)
Received a call form the pharmacist at [facility], pharmacists I asking for you to resend a rx for omeprazole capsules instead of the tablets.

## 2022-01-31 MED ORDER — OMEPRAZOLE 20 MG PO CPDR: 20.0000 mg | DELAYED_RELEASE_CAPSULE | Freq: Every day | ORAL | 2 refills | Status: DC

## 2022-02-04 ENCOUNTER — Encounter: Payer: Self-pay | Admitting: Cardiovascular Disease

## 2022-02-04 ENCOUNTER — Ambulatory Visit: Payer: BLUE CROSS/BLUE SHIELD | Attending: Cardiovascular Disease | Admitting: Cardiovascular Disease

## 2022-02-04 VITALS — BP 134/60 | HR 81 | Ht 66.0 in | Wt 196.0 lb

## 2022-02-04 DIAGNOSIS — I10 Essential (primary) hypertension: Secondary | ICD-10-CM

## 2022-02-04 DIAGNOSIS — E669 Obesity, unspecified: Secondary | ICD-10-CM

## 2022-02-04 DIAGNOSIS — J439 Emphysema, unspecified: Secondary | ICD-10-CM

## 2022-02-04 DIAGNOSIS — E782 Mixed hyperlipidemia: Secondary | ICD-10-CM

## 2022-02-04 NOTE — Patient Instructions (Addendum)
Medication Instructions:  No changes *If you need a refill on your cardiac medications before your next appointment, please call your pharmacy*  Procedures: Coronary CT- Calcium Score  Follow-Up: At [facility], you and your health needs are our priority.  As part of our continuing mission to provide you with exceptional heart care, we have created designated Provider Care Teams.  These Care Teams include your primary Cardiologist (physician) and Advanced Practice Providers (APPs -  Physician Assistants and Nurse Practitioners) who all work together to provide you with the care you need, when you need it.  We recommend signing up for the patient portal called "MyChart".  Sign up information is provided on this After Visit Summary.  MyChart is used to connect with patients for Virtual Visits (Telemedicine).  Patients are able to view lab/test results, encounter notes, upcoming appointments, etc.  Non-urgent messages can be sent to your provider as well.   To learn more about what you can do with MyChart, go to [email or web address].    Your next appointment:   Follow up as needed according to results  Provider:   Dr Gwenlyn Found Other Instructions Referral to Weight management

## 2022-02-04 NOTE — Assessment & Plan Note (Signed)
Patient was referred for shortness of breath.  She does have COPD and 35-pack-year history tobacco abuse having quit 7 years ago.  She was prescribed in a inhaled bronchodilators but unfortunately could not afford this.  In the past she has had a 2D echo revealing normal LV systolic function with diastolic dysfunction and nonischemic Myoview stress test.  I think she would benefit from weight loss and referral to a pulmonologist.

## 2022-02-04 NOTE — Assessment & Plan Note (Signed)
History of essential hypertension a blood pressure measured at 134/60.  She is on amlodipine.

## 2022-02-04 NOTE — Assessment & Plan Note (Signed)
History of hyperlipidemia lipid profile performed 03/20/2021 revealing total cholesterol 225 and LDL of 150.  She is not on a statin drug.  I am going to get a coronary calcium score to further evaluate how aggressive to be with resector modification.

## 2022-02-04 NOTE — Progress Notes (Signed)
02/04/2022 Kristen Moreno   Oct 07, 1960  [id number]  Primary Physician Linus Galas, MD Primary Cardiologist: [name] [name], [name]  HPI:  Kristen Moreno is a 62 y.o. 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  She was referred by her PCP, Dr.Guilloud, for evaluation of dyspnea on exertion.  She has seen Dr. Gardiner Rhyme in the past who had done a 2D echo, Myoview stress test and event monitor.  The 2D revealed diastolic dysfunction and the Myoview was nonischemic.  Event monitor showed no arrhythmias.  Her cardiac risk factor profile is notable for a 35-pack-year history tobacco abuse having quit 7 years ago with COPD, treated hypertension, untreated hyperlipidemia.  She never had a heart attack or stroke.  She denies chest pain but does get some dyspnea exertion.  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   Current Meds  Medication Sig   acetaminophen (TYLENOL) 500 MG tablet Take 1,500-2,000 mg by mouth daily as needed (pain).   albuterol (PROVENTIL HFA) 108 (90 Base) MCG/ACT inhaler Inhale 1-2 puffs into the lungs every 6 (six) hours as needed for wheezing or shortness of breath.   amLODipine (NORVASC) 10 MG tablet Take 10 mg by mouth daily.   guaiFENesin (ROBITUSSIN) 100 MG/5ML liquid Take 5-10 mLs (100-200 mg total) by mouth every 4 (four) hours as needed for cough or to loosen phlegm.   Magnesium 250 MG TABS Take 250 mg by mouth 2 (two) times daily.   methocarbamol (ROBAXIN) 500 MG tablet Take 1 tablet (500 mg total) by mouth every 6 (six) hours as needed for muscle spasms.   mycophenolate (MYFORTIC) 180 MG EC tablet Take 3 tablets (540 mg total) by mouth 2 (two) times daily.   omeprazole (PRILOSEC) 20 MG capsule Take 1 capsule (20 mg total) by mouth  daily.   tacrolimus (PROGRAF) 1 MG capsule Take 7 mg by mouth 2 (two) times daily.   Tiotropium Bromide Monohydrate 2.5 MCG/ACT AERS Inhale 2 puffs into the lungs daily.   [DISCONTINUED] azithromycin (ZITHROMAX) 250 MG tablet Take 1 tablet (250 mg total) by mouth daily. Take first 2 tablets together, then 1 every day until finished.   [DISCONTINUED] HYDROcodone-acetaminophen (NORCO/VICODIN) 5-325 MG tablet Take 1 tablet by mouth every 4 (four) hours as needed for moderate pain ((score 4 to 6)).   [DISCONTINUED] oxyCODONE-acetaminophen (PERCOCET) 5-325 MG tablet Take 1 tablet by mouth every 6 (six) hours as needed. (Patient taking differently: Take 1 tablet by mouth every 6 (six) hours as needed for moderate pain or severe pain.)     Allergies  Allergen Reactions   Ace Inhibitors Cough    Reaction to lisinopril   Lisinopril Cough    Social History   Socioeconomic History   Marital status: Legally Separated    Spouse name: Not on file   Number of children: 2   Years of education: Not on file   Highest education level: Not on file  Occupational History   Occupation: supervisor  Tobacco Use   Smoking status: Former    Packs/day: 0.50    Years: 37.00    Total pack years: 18.50    Types: Cigarettes    Quit date: 02/28/2016    Years since quitting: 5.9   Smokeless tobacco: Never  Vaping Use  Vaping Use: Never used  Substance and Sexual Activity   Alcohol use: Yes    Comment: occasional   Drug use: Never   Sexual activity: Not on file  Other Topics Concern   Not on file  Social History Narrative   Patient lives at home with her room mate.   Patient works full time.   Education some college.   Right handed.   Caffeine one soda per week.   Social Determinants of Health   Financial Resource Strain: Not on file  Food Insecurity: Not on file  Transportation Needs: Not on file  Physical Activity: Not on file  Stress: Not on file  Social Connections: Not on file  Intimate  Partner Violence: Not on file     Review of Systems: General: negative for chills, fever, night sweats or weight changes.  Cardiovascular: negative for chest pain, dyspnea on exertion, edema, orthopnea, palpitations, paroxysmal nocturnal dyspnea or shortness of breath Dermatological: negative for rash Respiratory: negative for cough or wheezing Urologic: negative for hematuria Abdominal: negative for nausea, vomiting, diarrhea, bright red blood per rectum, melena, or hematemesis Neurologic: negative for visual changes, syncope, or dizziness All other systems reviewed and are otherwise negative except as noted above.    Blood pressure 134/60, pulse 81, height '5\' 6"'$  (1.676 m), weight 196 lb (88.9 kg).  General appearance: alert and no distress Neck: no adenopathy, no carotid bruit, no JVD, supple, symmetrical, trachea midline, and thyroid not enlarged, symmetric, no tenderness/mass/nodules Lungs: clear to auscultation bilaterally Heart: regular rate and rhythm, S1, S2 normal, no murmur, click, rub or gallop Extremities: extremities normal, atraumatic, no cyanosis or edema Pulses: 2+ and symmetric Skin: Skin color, texture, turgor normal. No rashes or lesions Neurologic: Grossly normal  EKG not performed today  ASSESSMENT AND PLAN:   Hypertension History of essential hypertension a blood pressure measured at 134/60.  She is on amlodipine.  Hyperlipidemia History of hyperlipidemia lipid profile performed 03/20/2021 revealing total cholesterol 225 and LDL of 150.  She is not on a statin drug.  I am going to get a coronary calcium score to further evaluate how aggressive to be with resector modification.  Chronic obstructive pulmonary disease ([place]) Patient was referred for shortness of breath.  She does have COPD and 35-pack-year history tobacco abuse having quit 7 years ago.  She was prescribed in a inhaled bronchodilators but unfortunately could not afford this.  In the past she has had  a 2D echo revealing normal LV systolic function with diastolic dysfunction and nonischemic Myoview stress test.  I think she would benefit from weight loss and referral to a pulmonologist.     [name] [facility], [facility] 02/04/2022 2:07 PM

## 2022-02-12 ENCOUNTER — Encounter (HOSPITAL_COMMUNITY): Payer: Self-pay

## 2022-02-17 ENCOUNTER — Encounter (HOSPITAL_COMMUNITY): Payer: Self-pay

## 2022-02-19 ENCOUNTER — Encounter (INDEPENDENT_AMBULATORY_CARE_PROVIDER_SITE_OTHER): Payer: Medicare Other | Admitting: Family Medicine

## 2022-02-19 DIAGNOSIS — Z0289 Encounter for other administrative examinations: Secondary | ICD-10-CM

## 2022-02-24 ENCOUNTER — Ambulatory Visit (HOSPITAL_BASED_OUTPATIENT_CLINIC_OR_DEPARTMENT_OTHER)
Admission: RE | Admit: 2022-02-24 | Discharge: 2022-02-24 | Disposition: A | Discharge status: Home / Self Care | Payer: BLUE CROSS/BLUE SHIELD | Source: Ambulatory Visit | Attending: Cardiovascular Disease | Admitting: Cardiovascular Disease

## 2022-02-24 DIAGNOSIS — E669 Obesity, unspecified: Secondary | ICD-10-CM

## 2022-02-24 DIAGNOSIS — J439 Emphysema, unspecified: Secondary | ICD-10-CM

## 2022-02-24 DIAGNOSIS — E782 Mixed hyperlipidemia: Secondary | ICD-10-CM

## 2022-02-24 DIAGNOSIS — I10 Essential (primary) hypertension: Secondary | ICD-10-CM

## 2022-02-26 ENCOUNTER — Telehealth: Payer: Self-pay | Admitting: Cardiovascular Disease

## 2022-02-26 DIAGNOSIS — E782 Mixed hyperlipidemia: Secondary | ICD-10-CM

## 2022-02-26 MED ORDER — ATORVASTATIN CALCIUM 40 MG PO TABS: 40.0000 mg | ORAL_TABLET | Freq: Every day | ORAL | 3 refills | Status: DC

## 2022-02-26 NOTE — Telephone Encounter (Signed)
Patient returned RN's call. 

## 2022-02-26 NOTE — Telephone Encounter (Signed)
  Kristen Harp, MD 02/25/2022  6:17 AM EST     CCS 827, LDL 150 not on statin RX. Start Atorva 40 mg and re check FLP 3 months. Please run by Pharm D before prescribing since she has had a renal transplant to make sure that there are no interactions with her current meds    Rockne Menghini, RPH-CPP 02/25/2022  4:50 PM EST     She should be fine with atorvastatin, no concerns for her transplant medications   Spoke with pt regarding her coronary calcium score and Dr. Kennon Holter recommendations. Pt is agreeable to plan to start statin medication. Pt will start atorvastatin '40mg'$  once daily. Prescription sent to pt's pharmacy of choice. Lab orders placed and mailed to pt's home address. Pt will plan to get fasting labs done in 3 months. Pt verbalizes understanding.

## 2022-03-24 ENCOUNTER — Telehealth: Payer: Self-pay | Admitting: Cardiovascular Disease

## 2022-03-24 NOTE — Telephone Encounter (Signed)
   Pt would she need to take her kidney medication before getting her labs done

## 2022-03-24 NOTE — Telephone Encounter (Signed)
Spoke with pt she states that she has to get fasting lab done. Informed pt that she can take all her medications (with water) before getting her lab done. Verbalized understanding. She will be in end of April/May. She will come in fasting with nothing to eat after midnight.

## 2022-03-26 ENCOUNTER — Telehealth: Payer: Self-pay

## 2022-03-26 DIAGNOSIS — H52203 Unspecified astigmatism, bilateral: Secondary | ICD-10-CM | POA: Diagnosis not present

## 2022-03-26 DIAGNOSIS — H2513 Age-related nuclear cataract, bilateral: Secondary | ICD-10-CM | POA: Diagnosis not present

## 2022-03-26 DIAGNOSIS — E119 Type 2 diabetes mellitus without complications: Secondary | ICD-10-CM | POA: Diagnosis not present

## 2022-03-26 DIAGNOSIS — H25811 Combined forms of age-related cataract, right eye: Secondary | ICD-10-CM | POA: Diagnosis not present

## 2022-03-26 DIAGNOSIS — H524 Presbyopia: Secondary | ICD-10-CM | POA: Diagnosis not present

## 2022-03-26 DIAGNOSIS — H35013 Changes in retinal vascular appearance, bilateral: Secondary | ICD-10-CM | POA: Diagnosis not present

## 2022-03-26 DIAGNOSIS — H5203 Hypermetropia, bilateral: Secondary | ICD-10-CM | POA: Diagnosis not present

## 2022-03-26 DIAGNOSIS — H5371 Glare sensitivity: Secondary | ICD-10-CM | POA: Diagnosis not present

## 2022-03-26 NOTE — Telephone Encounter (Signed)
Decision:Denied Kristen Moreno (Key: [id number]) PA Case ID #: [id number] Rx #: [id number] Need Help? Call us at [phone] Outcome Denied today Denied. This health benefit plan does not cover the following services, supplies, drugs or charges: Any drug that is therapeutically equivalent to an over-the-counter drug where the over-the-counter products contain the same active ingredients as the prescription product at the same, or similar, strengths. -OR- Drugs that are Purchased over-the-counter, unless specifically listed as a covered drug in the formulary and a written prescription is provided Drug Omeprazole Magnesium '20MG'$  dr tablets ePA cloud logo Form [employer] [place] [address] OTC`S NOT COVEREDPRODUCT OR SERVICE NOT COVERED. PLEASECONTACT PRESCRIBER

## 2022-03-26 NOTE — Telephone Encounter (Signed)
Prior Authorization for patient (omeprazole) came through on cover my meds was submitted with last office notes awaiting approval or denial

## 2022-03-27 ENCOUNTER — Ambulatory Visit (INDEPENDENT_AMBULATORY_CARE_PROVIDER_SITE_OTHER): Payer: Medicare Other | Admitting: Family Medicine

## 2022-03-27 ENCOUNTER — Encounter (INDEPENDENT_AMBULATORY_CARE_PROVIDER_SITE_OTHER): Payer: Self-pay | Admitting: Family Medicine

## 2022-03-27 VITALS — BP 133/68 | HR 90 | Temp 98.2°F | Ht 65.0 in | Wt 191.0 lb

## 2022-03-27 DIAGNOSIS — E785 Hyperlipidemia, unspecified: Secondary | ICD-10-CM

## 2022-03-27 DIAGNOSIS — Z7985 Long-term (current) use of injectable non-insulin antidiabetic drugs: Secondary | ICD-10-CM | POA: Diagnosis not present

## 2022-03-27 DIAGNOSIS — E559 Vitamin D deficiency, unspecified: Secondary | ICD-10-CM

## 2022-03-27 DIAGNOSIS — G4733 Obstructive sleep apnea (adult) (pediatric): Secondary | ICD-10-CM | POA: Diagnosis not present

## 2022-03-27 DIAGNOSIS — I10 Essential (primary) hypertension: Secondary | ICD-10-CM | POA: Diagnosis not present

## 2022-03-27 DIAGNOSIS — Z940 Kidney transplant status: Secondary | ICD-10-CM | POA: Diagnosis not present

## 2022-03-27 DIAGNOSIS — Q613 Polycystic kidney, unspecified: Secondary | ICD-10-CM

## 2022-03-27 DIAGNOSIS — Z1331 Encounter for screening for depression: Secondary | ICD-10-CM

## 2022-03-27 DIAGNOSIS — R0602 Shortness of breath: Secondary | ICD-10-CM | POA: Diagnosis not present

## 2022-03-27 DIAGNOSIS — E1165 Type 2 diabetes mellitus with hyperglycemia: Secondary | ICD-10-CM

## 2022-03-27 DIAGNOSIS — E669 Obesity, unspecified: Secondary | ICD-10-CM

## 2022-03-27 DIAGNOSIS — N079 Hereditary nephropathy, not elsewhere classified with unspecified morphologic lesions: Secondary | ICD-10-CM

## 2022-03-27 DIAGNOSIS — Z6831 Body mass index (BMI) 31.0-31.9, adult: Secondary | ICD-10-CM | POA: Diagnosis not present

## 2022-03-27 DIAGNOSIS — R5383 Other fatigue: Secondary | ICD-10-CM | POA: Diagnosis not present

## 2022-03-27 DIAGNOSIS — E1169 Type 2 diabetes mellitus with other specified complication: Secondary | ICD-10-CM

## 2022-03-27 NOTE — Progress Notes (Deleted)
Came to info session.  Lactose intolerant- can't eat ice cream and does lactose free milk.  Living at home with daughter Estill Bamberg) and 3 granddaughters (Nyasia 55, Xena 5, Zoey 5). Desired weight is 165lbs.  Last time she was that weight was 1997. Started gaining weight after kidney transplant because she was on steroids. Skipping dinner and breakfast most of the time due to time constraints. Some of the preferences of her grandkids do influence her food choices.  [employer] in the am for coffee (9 creams and 9 sugars), hash brown and sausage egg and cheese biscuit or mcmuffin and 2 apple pies (eat entire hash brown, apple pies and leave a bite or 2 of sandwich) (feel full).  Eats around 12/1pm- gets [employer] (#1 with cheese, fries, fruit drink- feel full) or Captain D's 3 piece fish with coleslaw and french fries and sweet tea (will eat everything except 1 piece of fish- feels full).  Will likely not eat another whole meal- may just snack on a bag of chips or candy bars, or little tangerine oranges. She is drinking 2 24oz cans of beer daily.

## 2022-03-28 LAB — COMPREHENSIVE METABOLIC PANEL
ALT: 12 IU/L (ref 0–32)
AST: 14 IU/L (ref 0–40)
Albumin/Globulin Ratio: 2 (ref 1.2–2.2)
Albumin: 4.3 g/dL (ref 3.8–4.9)
Alkaline Phosphatase: 75 IU/L (ref 44–121)
BUN/Creatinine Ratio: 14 (ref 9–23)
BUN: 15 mg/dL (ref 6–24)
Bilirubin Total: 0.2 mg/dL (ref 0.0–1.2)
CO2: 19 mmol/L — ABNORMAL LOW (ref 20–29)
Calcium: 9.2 mg/dL (ref 8.7–10.2)
Chloride: 105 mmol/L (ref 96–106)
Creatinine, Ser: 1.08 mg/dL — ABNORMAL HIGH (ref 0.57–1.00)
Globulin, Total: 2.2 g/dL (ref 1.5–4.5)
Glucose: 122 mg/dL — ABNORMAL HIGH (ref 70–99)
Potassium: 3.6 mmol/L (ref 3.5–5.2)
Sodium: 142 mmol/L (ref 134–144)
Total Protein: 6.5 g/dL (ref 6.0–8.5)
eGFR: 59 mL/min/{1.73_m2} — ABNORMAL LOW (ref >59)

## 2022-03-28 LAB — VITAMIN D 25 HYDROXY (VIT D DEFICIENCY, FRACTURES): Vit D, 25-Hydroxy: 21.9 ng/mL — ABNORMAL LOW (ref 30.0–100.0)

## 2022-03-28 LAB — CBC WITH DIFFERENTIAL/PLATELET
Basophils Absolute: 0 10*3/uL (ref 0.0–0.2)
Basos: 1 %
EOS (ABSOLUTE): 0.1 10*3/uL (ref 0.0–0.4)
Eos: 2 %
Hematocrit: 40.5 % (ref 34.0–46.6)
Hemoglobin: 13.2 g/dL (ref 11.1–15.9)
Immature Grans (Abs): 0 10*3/uL (ref 0.0–0.1)
Immature Granulocytes: 0 %
Lymphocytes Absolute: 0.8 10*3/uL (ref 0.7–3.1)
Lymphs: 12 %
MCH: 30.1 pg (ref 26.6–33.0)
MCHC: 32.6 g/dL (ref 31.5–35.7)
MCV: 92 fL (ref 79–97)
Monocytes Absolute: 0.3 10*3/uL (ref 0.1–0.9)
Monocytes: 5 %
Neutrophils Absolute: 5.4 10*3/uL (ref 1.4–7.0)
Neutrophils: 80 %
Platelets: 213 10*3/uL (ref 150–450)
RBC: 4.39 x10E6/uL (ref 3.77–5.28)
RDW: 13.6 % (ref 11.7–15.4)
WBC: 6.8 10*3/uL (ref 3.4–10.8)

## 2022-03-28 LAB — LIPID PANEL WITH LDL/HDL RATIO
Cholesterol, Total: 163 mg/dL (ref 100–199)
HDL: 55 mg/dL (ref >39)
LDL Chol Calc (NIH): 87 mg/dL (ref 0–99)
LDL/HDL Ratio: 1.6 ratio (ref 0.0–3.2)
Triglycerides: 121 mg/dL (ref 0–149)
VLDL Cholesterol Cal: 21 mg/dL (ref 5–40)

## 2022-03-28 LAB — T3: T3, Total: 98 ng/dL (ref 71–180)

## 2022-03-28 LAB — TSH: TSH: 0.83 u[IU]/mL (ref 0.450–4.500)

## 2022-03-28 LAB — T4, FREE: Free T4: 1.3 ng/dL (ref 0.82–1.77)

## 2022-03-28 LAB — INSULIN, RANDOM: INSULIN: 10.7 u[IU]/mL (ref 2.6–24.9)

## 2022-03-28 LAB — HEMOGLOBIN A1C
Est. average glucose Bld gHb Est-mCnc: 166 mg/dL
Hgb A1c MFr Bld: 7.4 % — ABNORMAL HIGH (ref 4.8–5.6)

## 2022-03-28 LAB — VITAMIN B12: Vitamin B-12: 1127 pg/mL (ref 232–1245)

## 2022-04-03 ENCOUNTER — Ambulatory Visit (INDEPENDENT_AMBULATORY_CARE_PROVIDER_SITE_OTHER): Payer: Medicaid Other | Admitting: Student

## 2022-04-03 ENCOUNTER — Other Ambulatory Visit: Payer: Self-pay

## 2022-04-03 ENCOUNTER — Encounter (HOSPITAL_COMMUNITY): Payer: Self-pay

## 2022-04-03 VITALS — BP 131/67 | HR 79 | Temp 98.2°F | Ht 66.0 in | Wt 195.3 lb

## 2022-04-03 DIAGNOSIS — R350 Frequency of micturition: Secondary | ICD-10-CM

## 2022-04-03 DIAGNOSIS — N390 Urinary tract infection, site not specified: Secondary | ICD-10-CM

## 2022-04-03 LAB — POCT URINALYSIS DIPSTICK
Bilirubin, UA: NEGATIVE
Blood, UA: NEGATIVE
Glucose, UA: NEGATIVE
Ketones, UA: NEGATIVE
Nitrite, UA: POSITIVE
Protein, UA: NEGATIVE
Spec Grav, UA: 1.015 (ref 1.010–1.025)
Urobilinogen, UA: 0.2 E.U./dL
pH, UA: 6 (ref 5.0–8.0)

## 2022-04-03 MED ORDER — CEFDINIR 300 MG PO CAPS: 300.0000 mg | ORAL_CAPSULE | Freq: Two times a day (BID) | ORAL | 0 refills | Status: AC

## 2022-04-03 NOTE — Assessment & Plan Note (Addendum)
Assessment: Kristen Moreno presents with 10 days abdominal pressure/cramping worse in the suprapubic region, increased urinary frequency and urgency. She has had a urinary tract infection in the past but is unsure if her symptoms today are similar. She has had chills but denies fevers. On exam she has significant RLQ and suprapubic tenderness.   Urine dipstick with nitrites and trace leukocytes. With her symptoms and history of right sided transplant kidney, suspect complicated UTI in a person with a transplanted kidney. Will treat with cefdinir 300 mg BID for 7 days. CBC, bmp, UA and urine culture pending. Do not believe imaging is warranted at this time. Vital signs unremarkable, do not suspect sepsis or need for hospitalization at this time.   Hopeful no changes in renal function. She was given strict return precautions and discussed if symptoms occur in the future to present sooner for medical care to prevent any damage to her transplant kidney.   Plan: - cefdinir 300 mg BID for 7 days. Adjust if needed based on culture - follow up cbc, bmp, UA and urine culture.   UA with leukocytes, negative for nitrites despite urine dipstick. Reflux with many white blood cells and many bacteria. Will continue treatment and follow urine cultures. Cr stable. CBC unremarkable. Does have an [place] with bicarb of 19 but again does not appear septic and with stable vital sings. Do not believe need for for immediate hospitalization and will continue to monitor clinical course
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
## 2022-04-03 NOTE — Patient Instructions (Addendum)
Thank you, Ms.[place] for allowing [place] to provide your care today. Today we discussed   Urinary tract infection We will be treating you with antibiotics, cefdinir. Please take one pill twice daily for 7 days. If you do not get better or new symptoms come up, please call us immediately.   We will also get blood work today to check you blood counts and kidney function     I have ordered the following labs for you:   Lab Orders         POCT Urinalysis Dipstick [id number])       Referrals ordered today:   Referral Orders  No referral(s) requested today     I have ordered the following medication/changed the following medications:   Stop the following medications: There are no discontinued medications.   Start the following medications: No orders of the defined types were placed in this encounter.    Follow up: As needed or no improvement in symptoms   Should you have any questions or concerns please call the internal medicine clinic at [phone].    Sanjuana Letters, D.O. [place]

## 2022-04-04 ENCOUNTER — Encounter: Payer: Self-pay | Admitting: Student

## 2022-04-04 LAB — BMP8+ANION GAP
Anion Gap: 18 mmol/L (ref 10.0–18.0)
BUN/Creatinine Ratio: 15 (ref 9–23)
BUN: 16 mg/dL (ref 6–24)
CO2: 19 mmol/L — ABNORMAL LOW (ref 20–29)
Calcium: 9.1 mg/dL (ref 8.7–10.2)
Chloride: 106 mmol/L (ref 96–106)
Creatinine, Ser: 1.1 mg/dL — ABNORMAL HIGH (ref 0.57–1.00)
Glucose: 147 mg/dL — ABNORMAL HIGH (ref 70–99)
Potassium: 4 mmol/L (ref 3.5–5.2)
Sodium: 143 mmol/L (ref 134–144)
eGFR: 58 mL/min/{1.73_m2} — ABNORMAL LOW (ref >59)

## 2022-04-04 LAB — URINALYSIS, COMPLETE
Bilirubin, UA: NEGATIVE
Glucose, UA: NEGATIVE
Ketones, UA: NEGATIVE
Nitrite, UA: NEGATIVE
Protein,UA: NEGATIVE
RBC, UA: NEGATIVE
Specific Gravity, UA: 1.012 (ref 1.005–1.030)
Urobilinogen, Ur: 0.2 mg/dL (ref 0.2–1.0)
pH, UA: 6.5 (ref 5.0–7.5)

## 2022-04-04 LAB — CBC WITH DIFFERENTIAL/PLATELET
Basophils Absolute: 0 10*3/uL (ref 0.0–0.2)
Basos: 1 %
EOS (ABSOLUTE): 0.1 10*3/uL (ref 0.0–0.4)
Eos: 1 %
Hematocrit: 39.1 % (ref 34.0–46.6)
Hemoglobin: 13.1 g/dL (ref 11.1–15.9)
Immature Grans (Abs): 0 10*3/uL (ref 0.0–0.1)
Immature Granulocytes: 0 %
Lymphocytes Absolute: 1.1 10*3/uL (ref 0.7–3.1)
Lymphs: 15 %
MCH: 30.1 pg (ref 26.6–33.0)
MCHC: 33.5 g/dL (ref 31.5–35.7)
MCV: 90 fL (ref 79–97)
Monocytes Absolute: 0.4 10*3/uL (ref 0.1–0.9)
Monocytes: 6 %
Neutrophils Absolute: 5.4 10*3/uL (ref 1.4–7.0)
Neutrophils: 77 %
Platelets: 227 10*3/uL (ref 150–450)
RBC: 4.35 x10E6/uL (ref 3.77–5.28)
RDW: 13.1 % (ref 11.7–15.4)
WBC: 7 10*3/uL (ref 3.4–10.8)

## 2022-04-04 LAB — MICROSCOPIC EXAMINATION
Casts: NONE SEEN /lpf
Epithelial Cells (non renal): >10 /hpf — AB (ref 0–10)
RBC, Urine: NONE SEEN /hpf (ref 0–2)

## 2022-04-04 NOTE — Progress Notes (Signed)
Internal Medicine Clinic Attending  Case discussed with Dr. Katsadouros  At the time of the visit.  We reviewed the resident's history and exam and pertinent patient test results.  I agree with the assessment, diagnosis, and plan of care documented in the resident's note.  

## 2022-04-07 NOTE — Progress Notes (Signed)
Chief Complaint:   Kristen Moreno (MR# [id number]) is a 62 y.o. female who presents for evaluation and treatment of obesity and related comorbidities. Current BMI is Body mass index is 31.78 kg/m. Kristen Moreno has been struggling with her weight for many years and has been unsuccessful in either losing weight, maintaining weight loss, or reaching her healthy weight goal.  Came to info session.  Lactose intolerant- can't eat ice cream and does lactose free milk.  Living at home with daughter Kristen Moreno) and 3 granddaughters (Kristen Moreno 49, Kristen Moreno 5, Kristen Moreno 5). Desired weight is 165lbs.  Last time she was that weight was 1997. Started gaining weight after kidney transplant because she was on steroids. Skipping dinner and breakfast most of the time due to time constraints. Some of the preferences of her grandkids do influence her food choices.  [employer] in the am for coffee (9 creams and 9 sugars), hash brown and sausage egg and cheese biscuit or mcmuffin and 2 apple pies (eat entire hash brown, apple pies and leave a bite or 2 of sandwich) (feel full).  Eats around 12/1pm- gets [employer] (#1 with cheese, fries, fruit drink- feel full) or [employer] 3 piece fish with coleslaw and french fries and sweet tea (will eat everything except 1 piece of fish- feels full).  Will likely not eat another whole meal- may just snack on a bag of chips or candy bars, or little tangerine oranges. She is drinking 2 24oz cans of beer daily.    Kristen Moreno is currently in the action stage of change and ready to dedicate time achieving and maintaining a healthier weight. Kristen Moreno is interested in becoming our patient and working on intensive lifestyle modifications including (but not limited to) diet and exercise for weight loss.  Kristen Moreno's habits were reviewed today and are as follows: Her family eats meals together, she thinks her family will eat healthier with her, her desired weight loss is 26 lbs, she started gaining  weight after her kidney transplant surgery, her heaviest weight ever was 201 pounds, she has significant food cravings issues, she snacks frequently in the evenings, she wakes up frequently in the middle of the night to eat, she skips meals frequently, she is frequently drinking liquids with calories, she frequently makes poor food choices, she has problems with excessive hunger, she frequently eats larger portions than normal, and she struggles with emotional eating.  Depression Screen Kristen Moreno (modified PHQ-9) score was 14.  Subjective:   1. Other fatigue Kristen Moreno admits to daytime somnolence and admits to waking up still tired. Patient has a history of symptoms of daytime fatigue and morning fatigue. Kristen Moreno generally gets 6 or 7 hours of sleep per night, and states that she has nightime awakenings. Snoring is present. Apneic episodes are present. Epworth Sleepiness Score is 17.  EKG done on 12/02/2021, NSR.  2. SOBOE (shortness of breath on exertion) Kristen Moreno notes increasing shortness of breath with exercising and seems to be worsening over time with weight gain. She notes getting out of breath sooner with activity than she used to. This has not gotten worse recently. Kristen Moreno denies shortness of breath at rest or orthopnea.  3. Polycystic kidney disease Patient sees Kristen Moreno as CKA.  4. Type 2 diabetes mellitus with hyperglycemia, without long-term current use of insulin (HCC) Patient's A1c was recently elevated at 6.8.  5. Hyperlipidemia associated with type 2 diabetes mellitus (Kristen Moreno) Patient is on atorvastatin.  Patient was diagnosed about 1  month ago.  6. Hypertension, associated with PCKD Patient was diagnosed years ago.  Patient is on amlodipine only since kidney transplant.  7. S/P kidney transplant Patient is on mycophenolate and prednisone.  Patient voices kidney transplant stable.  8. OSA (obstructive sleep apnea) Patient does not use CPAP, uses it  for a while [name].  9. Vitamin D deficiency Patient was positive for fatigue.  Diagnosis likely, given obesity.  Assessment/Plan:   1. Other fatigue Kristen Moreno does feel that her weight is causing her energy to be lower than it should be. Fatigue may be related to obesity, depression or many other causes. Labs will be ordered, and in the meanwhile, Kristen Moreno will focus on self care including making healthy food choices, increasing physical activity and focusing on stress reduction.  Check IC and labs.  - TSH - T4, free - T3  2. SOBOE (shortness of breath on exertion) Kristen Moreno does feel that she gets out of breath more easily that she used to when she exercises. Kristen Moreno's shortness of breath appears to be obesity related and exercise induced. She has agreed to work on weight loss and gradually increase exercise to treat her exercise induced shortness of breath. Will continue to monitor closely.  3. Polycystic kidney disease Check labs today.  - CBC with Differential/Platelet - Comprehensive metabolic panel  4. Type 2 diabetes mellitus with hyperglycemia, without long-term current use of insulin (HCC) Check labs today.  - Hemoglobin A1c - Insulin, random  5. Hyperlipidemia associated with type 2 diabetes mellitus (Kristen Moreno) Check labs today.  - Lipid Panel With LDL/HDL Ratio  6. Hypertension, associated with PCKD Check labs today.  -CMP  7. S/P kidney transplant Check labs today.  - Vitamin B12  8. OSA (obstructive sleep apnea) Follow-up on further workup at next appointment given that RMR is low.  9. Vitamin D deficiency Check labs today.  - VITAMIN D 25 Hydroxy (Vit-D Deficiency, Fractures)  10. Depression screening Kristen Moreno had a positive depression screening. Depression is commonly associated with obesity and often results in emotional eating behaviors. We will monitor this closely and work on CBT to help improve the non-hunger eating patterns. Referral to  Psychology may be required if no improvement is seen as she continues in our clinic.  11. Class 1 obesity with serious comorbidity and body mass index (BMI) of 31.0 to 31.9 in adult, unspecified obesity type Kristen Moreno is currently in the action stage of change and her goal is to continue with weight loss efforts. I recommend Kristen Moreno begin the structured treatment plan as follows:  She has agreed to the Category 2 Plan.  Exercise goals: No exercise has been prescribed at this time.   Behavioral modification strategies: increasing lean protein intake, meal planning and cooking strategies, keeping healthy foods in the home, and planning for success.  She was informed of the importance of frequent follow-up visits to maximize her success with intensive lifestyle modifications for her multiple health conditions. She was informed we would discuss her lab results at her next visit unless there is a critical issue that needs to be addressed sooner. Kristen Moreno agreed to keep her next visit at the agreed upon time to discuss these results.  Objective:   Blood pressure 133/68, pulse 90, temperature 98.2 F (36.8 C), height 5\' 5"  (1.651 m), weight 191 lb (86.6 kg), SpO2 96 %. Body mass index is 31.78 kg/m.  EKG: Normal sinus rhythm, rate 81 bpm.  Indirect Calorimeter completed today shows a VO2 of 211 and a  REE of 1454.  Her calculated basal metabolic rate is 0000000 thus her basal metabolic rate is worse than expected.  General: Cooperative, alert, well developed, in no acute distress. HEENT: Conjunctivae and lids unremarkable. Cardiovascular: Regular rhythm.  Lungs: Normal work of breathing. Neurologic: No focal deficits.   Lab Results  Component Value Date   CREATININE 1.10 (H) 04/03/2022   BUN 16 04/03/2022   NA 143 04/03/2022   K 4.0 04/03/2022   CL 106 04/03/2022   CO2 19 (L) 04/03/2022   Lab Results  Component Value Date   ALT 12 03/27/2022   AST 14 03/27/2022   ALKPHOS 75  03/27/2022   BILITOT 0.2 03/27/2022   Lab Results  Component Value Date   HGBA1C 7.4 (H) 03/27/2022   HGBA1C 6.8 (A) 07/31/2021   HGBA1C 7.6 (A) 01/23/2021   HGBA1C 6.8 (A) 07/30/2020   Lab Results  Component Value Date   INSULIN 10.7 03/27/2022   Lab Results  Component Value Date   TSH 0.830 03/27/2022   Lab Results  Component Value Date   CHOL 163 03/27/2022   HDL 55 03/27/2022   LDLCALC 87 03/27/2022   TRIG 121 03/27/2022   CHOLHDL 5.1 (H) 07/03/2015   Lab Results  Component Value Date   WBC 7.0 04/03/2022   HGB 13.1 04/03/2022   HCT 39.1 04/03/2022   MCV 90 04/03/2022   PLT 227 04/03/2022   Lab Results  Component Value Date   IRON 45 12/02/2017   TIBC 262 12/02/2017   FERRITIN 1,202 (H) 01/10/2021   Attestation Statements:   Reviewed by clinician on day of visit: allergies, medications, problem list, medical history, surgical history, family history, social history, and previous encounter notes.  Time spent on visit including pre-visit chart review and post-visit charting and care was 50 minutes.   I, Davy Pique, RMA, am acting as transcriptionist for Coralie Common, MD.  This is the patient's first visit at [facility]. The patient's NEW PATIENT PACKET was reviewed at length. Included in the packet: current and past health history, medications, allergies, ROS, gynecologic history (women only), surgical history, family history, social history, weight history, weight loss surgery history (for those that have had weight loss surgery), nutritional evaluation, Moreno and food questionnaire, PHQ9, Epworth questionnaire, sleep habits questionnaire, patient life and health improvement goals questionnaire. These will all be scanned into the patient's chart under media.   During the visit, I independently reviewed the patient's EKG, bioimpedance scale results, and indirect calorimeter results. I used this information to tailor a meal plan for the  patient that will help her to lose weight and will improve her obesity-related conditions going forward. I performed a medically necessary appropriate examination and/or evaluation. I discussed the assessment and treatment plan with the patient. The patient was provided an opportunity to ask questions and all were answered. The patient agreed with the plan and demonstrated an understanding of the instructions. Labs were ordered at this visit and will be reviewed at the next visit unless more critical results need to be addressed immediately. Clinical information was updated and documented in the EMR.    I have reviewed the above documentation for accuracy and completeness, and I agree with the above. - Coralie Common, MD

## 2022-04-07 NOTE — Progress Notes (Addendum)
Urine culture with gram negative rods greater than 100,000 colonies. Will await susceptibilities. Called and spoke with Ms. Fuson who notes she is getting better every day. If symptoms return she will call the clinic back

## 2022-04-08 ENCOUNTER — Encounter: Payer: Self-pay | Admitting: Student

## 2022-04-08 LAB — URINE CULTURE

## 2022-04-10 ENCOUNTER — Encounter (INDEPENDENT_AMBULATORY_CARE_PROVIDER_SITE_OTHER): Payer: Self-pay | Admitting: Family Medicine

## 2022-04-10 ENCOUNTER — Ambulatory Visit (INDEPENDENT_AMBULATORY_CARE_PROVIDER_SITE_OTHER): Payer: Medicare Other | Admitting: Family Medicine

## 2022-04-10 VITALS — BP 131/72 | HR 78 | Temp 98.0°F | Ht 66.0 in | Wt 191.0 lb

## 2022-04-10 DIAGNOSIS — E1169 Type 2 diabetes mellitus with other specified complication: Secondary | ICD-10-CM | POA: Diagnosis not present

## 2022-04-10 DIAGNOSIS — E669 Obesity, unspecified: Secondary | ICD-10-CM | POA: Diagnosis not present

## 2022-04-10 DIAGNOSIS — E559 Vitamin D deficiency, unspecified: Secondary | ICD-10-CM

## 2022-04-10 DIAGNOSIS — E119 Type 2 diabetes mellitus without complications: Secondary | ICD-10-CM

## 2022-04-10 DIAGNOSIS — E785 Hyperlipidemia, unspecified: Secondary | ICD-10-CM | POA: Diagnosis not present

## 2022-04-10 DIAGNOSIS — E1159 Type 2 diabetes mellitus with other circulatory complications: Secondary | ICD-10-CM | POA: Diagnosis not present

## 2022-04-10 DIAGNOSIS — Z940 Kidney transplant status: Secondary | ICD-10-CM

## 2022-04-10 DIAGNOSIS — I152 Hypertension secondary to endocrine disorders: Secondary | ICD-10-CM | POA: Diagnosis not present

## 2022-04-10 DIAGNOSIS — Z6830 Body mass index (BMI) 30.0-30.9, adult: Secondary | ICD-10-CM | POA: Diagnosis not present

## 2022-04-10 MED ORDER — VITAMIN D (ERGOCALCIFEROL) 1.25 MG (50000 UNIT) PO CAPS: 50000.0000 [IU] | ORAL_CAPSULE | ORAL | 0 refills | Status: DC

## 2022-04-10 MED ORDER — BD PEN NEEDLE NANO 2ND GEN 32G X 4 MM MISC: 1.0000 | Freq: Two times a day (BID) | 0 refills | Status: AC

## 2022-04-10 MED ORDER — OZEMPIC (0.25 OR 0.5 MG/DOSE) 2 MG/3ML ~~LOC~~ SOPN: 0.2500 mg | PEN_INJECTOR | SUBCUTANEOUS | 0 refills | Status: DC

## 2022-04-10 NOTE — Progress Notes (Deleted)
Patient had urinary symptoms since last appointment.  She felt poorly the day of the last appointment and has been on antibiotics since (omnicef).  Her urine culture grew E. Coli and she has had improvement on this medication.  She took her last dose of medication today.  Hasn't really followed much of the meal plan due to not doing well. She wasn't really able to follow the plan and realizes that in the next few weeks her big obstacle will be decreasing the amount of butter and oil she uses.

## 2022-04-14 ENCOUNTER — Telehealth (INDEPENDENT_AMBULATORY_CARE_PROVIDER_SITE_OTHER): Payer: Self-pay

## 2022-04-14 NOTE — Telephone Encounter (Signed)
PA for Zepbound started 

## 2022-04-15 DIAGNOSIS — E669 Obesity, unspecified: Secondary | ICD-10-CM | POA: Insufficient documentation

## 2022-04-15 NOTE — Progress Notes (Signed)
Chief Complaint:   OBESITY Kristen Moreno is here to discuss her progress with her obesity treatment plan along with follow-up of her obesity related diagnoses. Kristen Moreno is on the Category 2 plan and states she is following her eating plan approximately 0% of the time. Kristen Moreno states she has not been exercising.  Today's visit was #: 2 Starting weight: 191 lbs Starting date: 03/27/2022 Today's weight: 191 lbs Today's date: 04/10/2022 Total lbs lost to date: 0 Total lbs lost since last in-office visit: 0  Interim History: Patient had urinary symptoms since last appointment.  She felt poorly the day of the last appointment and has been on antibiotics since (omnicef).  Her urine culture grew E. Coli and she has had improvement on this medication.  She took her last dose of medication today.  Hasn't really followed much of the meal plan due to not doing well. She wasn't really able to follow the plan and realizes that in the next few weeks her big obstacle will be decreasing the amount of butter and oil she uses.   Subjective:   1. Vitamin D deficiency ([place]) Not on vitamin D. Positive for fatigue.  Vitamin D level of 21.9 Discussed recent lab results.  2. Hyperlipidemia associated with type 2 diabetes mellitus (HCC) On Lipitor 40 mg daily.  LDL 87.  Triglycerides 121.  HDL 55. No side effects noted.  3. Type 2 diabetes mellitus without complication, without long-term current use of insulin (HCC) A1c 7.4.  Insulin 10.7. Previously on Ozempic but med was stopped.  4. Hypertension associated with diabetes ([place]) Blood pressure controlled today.  No chest pain, chest pressure, headache. On Norvasc.  5. S/P kidney transplant BUNs/creatinine within normal limits for patient. Sees Dr. Posey Pronto.   Assessment/Plan:   1. Vitamin D deficiency Start- - Vitamin D, Ergocalciferol, (DRISDOL) 1.25 MG (50000 UNIT) CAPS capsule; Take 1 capsule (50,000 Units total) by mouth every 7 (seven) days.   Dispense: 4 capsule; Refill: 0  2. Hyperlipidemia associated with type 2 diabetes mellitus (HCC) Continue Lipitor.  No change in dose.  3. Type 2 diabetes mellitus without complication, without long-term current use of insulin (HCC) Start Ozempic. - Semaglutide,0.25 or 0.5MG /DOS, (OZEMPIC, 0.25 OR 0.5 MG/DOSE,) 2 MG/3ML SOPN; Inject 0.25 mg into the skin once a week.  Dispense: 3 mL; Refill: 0 - Insulin Pen Needle (BD PEN NEEDLE NANO 2ND GEN) 32G X 4 MM MISC; 1 Package by Does not apply route 2 (two) times daily.  Dispense: 100 each; Refill: 0  4. Hypertension associated with diabetes ([place]) Continue current meds.  No change in medication.  5. S/P kidney transplant Follow-up with nephrology at next scheduled appointment (April 6).  6.  Generalized obesity: Starting BMI 31.78      BMI 30 Kristen Moreno is currently in the action stage of change. As such, her goal is to continue with weight loss efforts. She has agreed to the Category 2 plan.  Exercise goals: No exercise has been prescribed at this time.  Behavioral modification strategies: increasing lean protein intake, meal planning and cooking strategies, keeping healthy foods in the home, and planning for success.  Kristen Moreno has agreed to follow-up with our clinic in 2-3 weeks. She was informed of the importance of frequent follow-up visits to maximize her success with intensive lifestyle modifications for her multiple health conditions.   Objective:   Blood pressure 131/72, pulse 78, temperature 98 F (36.7 C), height 5\' 6"  (1.676 m), weight 191 lb (86.6 kg), SpO2  99 %. Body mass index is 30.83 kg/m.  General: Cooperative, alert, well developed, in no acute distress. HEENT: Conjunctivae and lids unremarkable. Cardiovascular: Regular rhythm.  Lungs: Normal work of breathing. Neurologic: No focal deficits.   Lab Results  Component Value Date   CREATININE 1.10 (H) 04/03/2022   BUN 16 04/03/2022   NA 143 04/03/2022   K 4.0  04/03/2022   CL 106 04/03/2022   CO2 19 (L) 04/03/2022   Lab Results  Component Value Date   ALT 12 03/27/2022   AST 14 03/27/2022   ALKPHOS 75 03/27/2022   BILITOT 0.2 03/27/2022   Lab Results  Component Value Date   HGBA1C 7.4 (H) 03/27/2022   HGBA1C 6.8 (A) 07/31/2021   HGBA1C 7.6 (A) 01/23/2021   HGBA1C 6.8 (A) 07/30/2020   Lab Results  Component Value Date   INSULIN 10.7 03/27/2022   Lab Results  Component Value Date   TSH 0.830 03/27/2022   Lab Results  Component Value Date   CHOL 163 03/27/2022   HDL 55 03/27/2022   LDLCALC 87 03/27/2022   TRIG 121 03/27/2022   CHOLHDL 5.1 (H) 07/03/2015   Lab Results  Component Value Date   VD25OH 21.9 (L) 03/27/2022   VD25OH 13.1 (L) 04/01/2017   VD25OH 33 06/25/2009   Lab Results  Component Value Date   WBC 7.0 04/03/2022   HGB 13.1 04/03/2022   HCT 39.1 04/03/2022   MCV 90 04/03/2022   PLT 227 04/03/2022   Lab Results  Component Value Date   IRON 45 12/02/2017   TIBC 262 12/02/2017   FERRITIN 1,202 (H) 01/10/2021    Attestation Statements:   Reviewed by clinician on day of visit: allergies, medications, problem list, medical history, surgical history, family history, social history, and previous encounter notes.  Time spent on visit including pre-visit chart review and post-visit care and charting was 45 minutes.   I, Dawn Whitmire, FNP-C, am acting as Location manager for Coralie Common, MD.  I have reviewed the above documentation for accuracy and completeness, and I agree with the above. - Coralie Common, MD

## 2022-04-16 ENCOUNTER — Telehealth (INDEPENDENT_AMBULATORY_CARE_PROVIDER_SITE_OTHER): Payer: Self-pay | Admitting: Family Medicine

## 2022-04-16 ENCOUNTER — Encounter (INDEPENDENT_AMBULATORY_CARE_PROVIDER_SITE_OTHER): Payer: Self-pay

## 2022-04-16 NOTE — Telephone Encounter (Signed)
3/27 Patient stated that she went to the pharmancy to pick up Ozempic but instead received pins instead. Patient stated she would like someone to call her. Kristen Moreno

## 2022-04-16 NOTE — Telephone Encounter (Signed)
Pt will call and start appeal and check which medicines insurance will cover-CS

## 2022-04-16 NOTE — Telephone Encounter (Signed)
PA denied.

## 2022-04-21 DIAGNOSIS — R799 Abnormal finding of blood chemistry, unspecified: Secondary | ICD-10-CM | POA: Diagnosis not present

## 2022-04-21 DIAGNOSIS — Z940 Kidney transplant status: Secondary | ICD-10-CM | POA: Diagnosis not present

## 2022-04-21 DIAGNOSIS — E785 Hyperlipidemia, unspecified: Secondary | ICD-10-CM | POA: Diagnosis not present

## 2022-04-21 DIAGNOSIS — Z79899 Other long term (current) drug therapy: Secondary | ICD-10-CM | POA: Diagnosis not present

## 2022-04-21 DIAGNOSIS — M25551 Pain in right hip: Secondary | ICD-10-CM | POA: Diagnosis not present

## 2022-04-21 DIAGNOSIS — G8929 Other chronic pain: Secondary | ICD-10-CM | POA: Diagnosis not present

## 2022-04-21 DIAGNOSIS — E669 Obesity, unspecified: Secondary | ICD-10-CM | POA: Diagnosis not present

## 2022-04-21 DIAGNOSIS — M549 Dorsalgia, unspecified: Secondary | ICD-10-CM | POA: Diagnosis not present

## 2022-04-21 DIAGNOSIS — M25511 Pain in right shoulder: Secondary | ICD-10-CM | POA: Diagnosis not present

## 2022-04-21 DIAGNOSIS — M25512 Pain in left shoulder: Secondary | ICD-10-CM | POA: Diagnosis not present

## 2022-04-21 DIAGNOSIS — M129 Arthropathy, unspecified: Secondary | ICD-10-CM | POA: Diagnosis not present

## 2022-04-21 DIAGNOSIS — N390 Urinary tract infection, site not specified: Secondary | ICD-10-CM | POA: Diagnosis not present

## 2022-04-21 DIAGNOSIS — E119 Type 2 diabetes mellitus without complications: Secondary | ICD-10-CM | POA: Diagnosis not present

## 2022-04-23 ENCOUNTER — Encounter (INDEPENDENT_AMBULATORY_CARE_PROVIDER_SITE_OTHER): Payer: Self-pay

## 2022-04-24 DIAGNOSIS — Z79899 Other long term (current) drug therapy: Secondary | ICD-10-CM | POA: Diagnosis not present

## 2022-04-28 DIAGNOSIS — E559 Vitamin D deficiency, unspecified: Secondary | ICD-10-CM | POA: Diagnosis not present

## 2022-04-28 DIAGNOSIS — I129 Hypertensive chronic kidney disease with stage 1 through stage 4 chronic kidney disease, or unspecified chronic kidney disease: Secondary | ICD-10-CM | POA: Diagnosis not present

## 2022-04-28 DIAGNOSIS — E669 Obesity, unspecified: Secondary | ICD-10-CM | POA: Diagnosis not present

## 2022-04-28 DIAGNOSIS — N2581 Secondary hyperparathyroidism of renal origin: Secondary | ICD-10-CM | POA: Diagnosis not present

## 2022-04-28 DIAGNOSIS — M25551 Pain in right hip: Secondary | ICD-10-CM | POA: Diagnosis not present

## 2022-04-28 DIAGNOSIS — Z79899 Other long term (current) drug therapy: Secondary | ICD-10-CM | POA: Diagnosis not present

## 2022-04-28 DIAGNOSIS — M25512 Pain in left shoulder: Secondary | ICD-10-CM | POA: Diagnosis not present

## 2022-04-28 DIAGNOSIS — M25511 Pain in right shoulder: Secondary | ICD-10-CM | POA: Diagnosis not present

## 2022-04-28 DIAGNOSIS — E119 Type 2 diabetes mellitus without complications: Secondary | ICD-10-CM | POA: Diagnosis not present

## 2022-04-28 DIAGNOSIS — D631 Anemia in chronic kidney disease: Secondary | ICD-10-CM | POA: Diagnosis not present

## 2022-04-28 DIAGNOSIS — Z940 Kidney transplant status: Secondary | ICD-10-CM | POA: Diagnosis not present

## 2022-04-28 DIAGNOSIS — M549 Dorsalgia, unspecified: Secondary | ICD-10-CM | POA: Diagnosis not present

## 2022-05-01 DIAGNOSIS — Z79899 Other long term (current) drug therapy: Secondary | ICD-10-CM | POA: Diagnosis not present

## 2022-05-04 ENCOUNTER — Other Ambulatory Visit: Payer: Self-pay

## 2022-05-05 ENCOUNTER — Telehealth (INDEPENDENT_AMBULATORY_CARE_PROVIDER_SITE_OTHER): Payer: Self-pay | Admitting: Family Medicine
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
## 2022-05-06 ENCOUNTER — Ambulatory Visit (INDEPENDENT_AMBULATORY_CARE_PROVIDER_SITE_OTHER): Payer: Medicare Other | Admitting: Family Medicine

## 2022-05-06 ENCOUNTER — Encounter (INDEPENDENT_AMBULATORY_CARE_PROVIDER_SITE_OTHER): Payer: Self-pay | Admitting: Family Medicine

## 2022-05-06 VITALS — BP 120/69 | HR 89 | Temp 98.6°F | Ht 66.0 in | Wt 184.0 lb

## 2022-05-06 DIAGNOSIS — Z638 Other specified problems related to primary support group: Secondary | ICD-10-CM

## 2022-05-06 DIAGNOSIS — Z6829 Body mass index (BMI) 29.0-29.9, adult: Secondary | ICD-10-CM

## 2022-05-06 DIAGNOSIS — E559 Vitamin D deficiency, unspecified: Secondary | ICD-10-CM | POA: Diagnosis not present

## 2022-05-06 DIAGNOSIS — E669 Obesity, unspecified: Secondary | ICD-10-CM

## 2022-05-06 DIAGNOSIS — Z7985 Long-term (current) use of injectable non-insulin antidiabetic drugs: Secondary | ICD-10-CM

## 2022-05-06 DIAGNOSIS — E66811 Obesity, class 1: Secondary | ICD-10-CM

## 2022-05-06 DIAGNOSIS — E119 Type 2 diabetes mellitus without complications: Secondary | ICD-10-CM | POA: Diagnosis not present

## 2022-05-06 MED ORDER — OZEMPIC (0.25 OR 0.5 MG/DOSE) 2 MG/3ML ~~LOC~~ SOPN: 0.2500 mg | PEN_INJECTOR | SUBCUTANEOUS | 0 refills | Status: DC

## 2022-05-06 MED ORDER — SERTRALINE HCL 25 MG PO TABS: 25.0000 mg | ORAL_TABLET | Freq: Every day | ORAL | 0 refills | Status: DC

## 2022-05-06 MED ORDER — VITAMIN D (ERGOCALCIFEROL) 1.25 MG (50000 UNIT) PO CAPS: 50000.0000 [IU] | ORAL_CAPSULE | ORAL | 0 refills | Status: DC
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
## 2022-05-13 ENCOUNTER — Other Ambulatory Visit (INDEPENDENT_AMBULATORY_CARE_PROVIDER_SITE_OTHER): Payer: Self-pay | Admitting: Family Medicine

## 2022-05-13 DIAGNOSIS — E559 Vitamin D deficiency, unspecified: Secondary | ICD-10-CM

## 2022-05-13 DIAGNOSIS — E119 Type 2 diabetes mellitus without complications: Secondary | ICD-10-CM

## 2022-05-14 ENCOUNTER — Ambulatory Visit
Admission: EM | Admit: 2022-05-14 | Discharge: 2022-05-14 | Disposition: A | Discharge status: Home / Self Care | Payer: Medicaid Other | Attending: Physician Assistant | Admitting: Physician Assistant

## 2022-05-14 DIAGNOSIS — R197 Diarrhea, unspecified: Secondary | ICD-10-CM

## 2022-05-14 DIAGNOSIS — R112 Nausea with vomiting, unspecified: Secondary | ICD-10-CM | POA: Diagnosis not present

## 2022-05-14 DIAGNOSIS — R5383 Other fatigue: Secondary | ICD-10-CM

## 2022-05-14 LAB — POCT URINALYSIS DIP (MANUAL ENTRY)
Blood, UA: NEGATIVE
Glucose, UA: NEGATIVE mg/dL
Ketones, POC UA: NEGATIVE mg/dL
Leukocytes, UA: NEGATIVE
Nitrite, UA: NEGATIVE
Protein Ur, POC: 30 mg/dL — AB
Spec Grav, UA: >=1.03 — AB (ref 1.010–1.025)
Urobilinogen, UA: 0.2 E.U./dL
pH, UA: 6 (ref 5.0–8.0)

## 2022-05-14 LAB — POCT FASTING CBG KUC MANUAL ENTRY: POCT Glucose (KUC): 99 mg/dL (ref 70–99)

## 2022-05-14 MED ORDER — ONDANSETRON HCL 4 MG PO TABS: 4.0000 mg | ORAL_TABLET | Freq: Three times a day (TID) | ORAL | 0 refills | Status: DC | PRN
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
## 2022-05-14 NOTE — Discharge Instructions (Addendum)
Advised to take the Zofran 4 mg every 8 hours to help control nausea and vomiting. Advised to increase fluid intake with clear liquids and then rolling to a bland diet with soups and to avoid fried, spicy, greasy foods over the next 48 to 72 hours.  Patient advised to follow-up with PCP or to the emergency room if symptoms fail to improve over the next 2 to 3 days.
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
## 2022-05-14 NOTE — ED Triage Notes (Signed)
Pt c/o body aches, chills, nausea, fatigue, malaise,   Onset ~ Thursday

## 2022-05-15 LAB — COMPREHENSIVE METABOLIC PANEL
ALT: 10 IU/L (ref 0–32)
AST: 14 IU/L (ref 0–40)
Albumin/Globulin Ratio: 1.5 (ref 1.2–2.2)
Albumin: 4 g/dL (ref 3.8–4.9)
Alkaline Phosphatase: 71 IU/L (ref 44–121)
BUN/Creatinine Ratio: 10 (ref 9–23)
BUN: 12 mg/dL (ref 6–24)
Bilirubin Total: 0.3 mg/dL (ref 0.0–1.2)
CO2: 22 mmol/L (ref 20–29)
Calcium: 9.4 mg/dL (ref 8.7–10.2)
Chloride: 105 mmol/L (ref 96–106)
Creatinine, Ser: 1.2 mg/dL — ABNORMAL HIGH (ref 0.57–1.00)
Globulin, Total: 2.7 g/dL (ref 1.5–4.5)
Glucose: 97 mg/dL (ref 70–99)
Potassium: 3.5 mmol/L (ref 3.5–5.2)
Sodium: 143 mmol/L (ref 134–144)
Total Protein: 6.7 g/dL (ref 6.0–8.5)
eGFR: 52 mL/min/{1.73_m2} — ABNORMAL LOW (ref >59)

## 2022-05-15 LAB — CBC
Hematocrit: 38.8 % (ref 34.0–46.6)
Hemoglobin: 12.4 g/dL (ref 11.1–15.9)
MCH: 29 pg (ref 26.6–33.0)
MCHC: 32 g/dL (ref 31.5–35.7)
MCV: 91 fL (ref 79–97)
Platelets: 292 10*3/uL (ref 150–450)
RBC: 4.28 x10E6/uL (ref 3.77–5.28)
RDW: 13.3 % (ref 11.7–15.4)
WBC: 5.5 10*3/uL (ref 3.4–10.8)

## 2022-05-26 DIAGNOSIS — Z940 Kidney transplant status: Secondary | ICD-10-CM | POA: Diagnosis not present

## 2022-05-26 DIAGNOSIS — D849 Immunodeficiency, unspecified: Secondary | ICD-10-CM | POA: Diagnosis not present

## 2022-05-26 DIAGNOSIS — I10 Essential (primary) hypertension: Secondary | ICD-10-CM | POA: Diagnosis not present

## 2022-05-26 DIAGNOSIS — Z79899 Other long term (current) drug therapy: Secondary | ICD-10-CM | POA: Diagnosis not present

## 2022-05-26 DIAGNOSIS — E139 Other specified diabetes mellitus without complications: Secondary | ICD-10-CM | POA: Diagnosis not present

## 2022-05-28 ENCOUNTER — Encounter (HOSPITAL_COMMUNITY): Payer: Self-pay

## 2022-05-28 DIAGNOSIS — M25551 Pain in right hip: Secondary | ICD-10-CM | POA: Diagnosis not present

## 2022-05-28 DIAGNOSIS — Z780 Asymptomatic menopausal state: Secondary | ICD-10-CM | POA: Diagnosis not present

## 2022-05-28 DIAGNOSIS — M549 Dorsalgia, unspecified: Secondary | ICD-10-CM | POA: Diagnosis not present

## 2022-05-28 DIAGNOSIS — Z79899 Other long term (current) drug therapy: Secondary | ICD-10-CM | POA: Diagnosis not present

## 2022-05-28 DIAGNOSIS — E669 Obesity, unspecified: Secondary | ICD-10-CM | POA: Diagnosis not present

## 2022-05-28 DIAGNOSIS — E119 Type 2 diabetes mellitus without complications: Secondary | ICD-10-CM | POA: Diagnosis not present

## 2022-05-28 DIAGNOSIS — M25511 Pain in right shoulder: Secondary | ICD-10-CM | POA: Diagnosis not present

## 2022-05-28 DIAGNOSIS — E559 Vitamin D deficiency, unspecified: Secondary | ICD-10-CM | POA: Diagnosis not present

## 2022-05-28 DIAGNOSIS — M25512 Pain in left shoulder: Secondary | ICD-10-CM | POA: Diagnosis not present

## 2022-05-28 DIAGNOSIS — Z940 Kidney transplant status: Secondary | ICD-10-CM | POA: Diagnosis not present

## 2022-05-30 DIAGNOSIS — Z79899 Other long term (current) drug therapy: Secondary | ICD-10-CM | POA: Diagnosis not present

## 2022-06-03 ENCOUNTER — Other Ambulatory Visit: Payer: Self-pay

## 2022-06-03 ENCOUNTER — Ambulatory Visit (INDEPENDENT_AMBULATORY_CARE_PROVIDER_SITE_OTHER): Payer: Medicaid Other | Admitting: Family Medicine

## 2022-06-03 ENCOUNTER — Encounter (INDEPENDENT_AMBULATORY_CARE_PROVIDER_SITE_OTHER): Payer: Self-pay | Admitting: Family Medicine

## 2022-06-03 VITALS — BP 116/53 | HR 77 | Temp 98.1°F | Ht 66.0 in | Wt 178.0 lb

## 2022-06-03 DIAGNOSIS — E119 Type 2 diabetes mellitus without complications: Secondary | ICD-10-CM | POA: Diagnosis not present

## 2022-06-03 DIAGNOSIS — E559 Vitamin D deficiency, unspecified: Secondary | ICD-10-CM | POA: Diagnosis not present

## 2022-06-03 DIAGNOSIS — Z7985 Long-term (current) use of injectable non-insulin antidiabetic drugs: Secondary | ICD-10-CM | POA: Diagnosis not present

## 2022-06-03 DIAGNOSIS — Z638 Other specified problems related to primary support group: Secondary | ICD-10-CM | POA: Diagnosis not present

## 2022-06-03 DIAGNOSIS — E669 Obesity, unspecified: Secondary | ICD-10-CM | POA: Diagnosis not present

## 2022-06-03 DIAGNOSIS — R109 Unspecified abdominal pain: Secondary | ICD-10-CM | POA: Diagnosis not present

## 2022-06-03 DIAGNOSIS — Z6828 Body mass index (BMI) 28.0-28.9, adult: Secondary | ICD-10-CM | POA: Diagnosis not present

## 2022-06-03 MED ORDER — OZEMPIC (0.25 OR 0.5 MG/DOSE) 2 MG/3ML ~~LOC~~ SOPN: 0.2500 mg | PEN_INJECTOR | SUBCUTANEOUS | 0 refills | Status: DC

## 2022-06-03 MED ORDER — SERTRALINE HCL 25 MG PO TABS
25.0000 mg | ORAL_TABLET | Freq: Every day | ORAL | 0 refills | Status: DC
Start: 2022-06-03 — End: 2022-06-19

## 2022-06-03 MED ORDER — VITAMIN D (ERGOCALCIFEROL) 1.25 MG (50000 UNIT) PO CAPS: 50000.0000 [IU] | ORAL_CAPSULE | ORAL | 0 refills | Status: DC

## 2022-06-03 NOTE — Progress Notes (Unsigned)
Chief Complaint:   OBESITY Kristen Moreno is here to discuss her progress with her obesity treatment plan along with follow-up of her obesity related diagnoses. Kristen Moreno is on practicing portion control and making smarter food choices, such as increasing vegetables and decreasing simple carbohydrates and states she is following her eating plan approximately 75% of the time. Kristen Moreno states she is doing 0 minutes 0 times per week.  Today's visit was #: 4 Starting weight: 191 lbs Starting date: 03/27/2022 Today's weight: 178 lbs Today's date: 06/03/2022 Total lbs lost to date: 13 Total lbs lost since last in-office visit: 6  Interim History: Life has calmed down a bit more since last appointment.  She has started moving and is almost completely moved from her last place she lived.  She went to Urgent Care and got on antibiotic and she then she went to nephrology and was found to not have cleared the UTI and was placed on more antibiotics.  She still isn't peeing much and has to get follow up tests done today after this appointment.  She isn't taking much in food wise.  Sometimes when she eats she isn't getting enough in.  She sometimes feels like she is going to throw up even with starting eating.    Subjective:   1. Type 2 diabetes mellitus without complication, without long-term current use of insulin (HCC) Kristen Moreno's last A1c was 7.4 and insulin 10.7.  She is on Ozempic.  She has poor oral intake (could be due to UTI)  2. Stress due to family tension Kristen Moreno is in a better situation with her daughter.  She denies suicidal or homicidal ideations, and her mood is better today.  3. Vitamin D deficiency Kristen Moreno denies nausea, vomiting, or muscle weakness, except with recent UTI.  Her last vitamin D level was 21.9.  Assessment/Plan:   1. Type 2 diabetes mellitus without complication, without long-term current use of insulin (HCC) We will refill Ozempic at 0.25 mg once weekly for 1  month.  - Semaglutide,0.25 or 0.5MG /DOS, (OZEMPIC, 0.25 OR 0.5 MG/DOSE,) 2 MG/3ML SOPN; Inject 0.25 mg into the skin once a week.  Dispense: 3 mL; Refill: 0  2. Stress due to family tension We will refill sertraline 25 mg once daily for 90 days.  - sertraline (ZOLOFT) 25 MG tablet; Take 1 tablet (25 mg total) by mouth daily.  Dispense: 90 tablet; Refill: 0  3. Vitamin D deficiency We will refill prescription vitamin D for 1 month.  - Vitamin D, Ergocalciferol, (DRISDOL) 1.25 MG (50000 UNIT) CAPS capsule; Take 1 capsule (50,000 Units total) by mouth every 7 (seven) days.  Dispense: 4 capsule; Refill: 0  4. BMI 28.0-28.9,adult  5. Obesity with starting BMI of 31.9 Kristen Moreno is currently in the action stage of change. As such, her goal is to continue with weight loss efforts. She has agreed to practicing portion control and making smarter food choices, such as increasing vegetables and decreasing simple carbohydrates.   Exercise goals: No exercise has been prescribed at this time.  Behavioral modification strategies: increasing lean protein intake, meal planning and cooking strategies, keeping healthy foods in the home, and planning for success.  Kristen Moreno has agreed to follow-up with our clinic in 2 weeks. She was informed of the importance of frequent follow-up visits to maximize her success with intensive lifestyle modifications for her multiple health conditions.   Objective:   Blood pressure (!) 116/53, pulse 77, temperature 98.1 F (36.7 C), height 5\' 6"  (1.676 m),  weight 178 lb (80.7 kg), SpO2 97 %. Body mass index is 28.73 kg/m.  General: Cooperative, alert, well developed, in no acute distress. HEENT: Conjunctivae and lids unremarkable. Cardiovascular: Regular rhythm.  Lungs: Normal work of breathing. Neurologic: No focal deficits.   Lab Results  Component Value Date   CREATININE 1.20 (H) 05/14/2022   BUN 12 05/14/2022   NA 143 05/14/2022   K 3.5 05/14/2022   CL 105  05/14/2022   CO2 22 05/14/2022   Lab Results  Component Value Date   ALT 10 05/14/2022   AST 14 05/14/2022   ALKPHOS 71 05/14/2022   BILITOT 0.3 05/14/2022   Lab Results  Component Value Date   HGBA1C 7.4 (H) 03/27/2022   HGBA1C 6.8 (A) 07/31/2021   HGBA1C 7.6 (A) 01/23/2021   HGBA1C 6.8 (A) 07/30/2020   Lab Results  Component Value Date   INSULIN 10.7 03/27/2022   Lab Results  Component Value Date   TSH 0.830 03/27/2022   Lab Results  Component Value Date   CHOL 163 03/27/2022   HDL 55 03/27/2022   LDLCALC 87 03/27/2022   TRIG 121 03/27/2022   CHOLHDL 5.1 (H) 07/03/2015   Lab Results  Component Value Date   VD25OH 21.9 (L) 03/27/2022   VD25OH 13.1 (L) 04/01/2017   VD25OH 33 06/25/2009   Lab Results  Component Value Date   WBC 5.5 05/14/2022   HGB 12.4 05/14/2022   HCT 38.8 05/14/2022   MCV 91 05/14/2022   PLT 292 05/14/2022   Lab Results  Component Value Date   IRON 45 12/02/2017   TIBC 262 12/02/2017   FERRITIN 1,202 (H) 01/10/2021   Attestation Statements:   Reviewed by clinician on day of visit: allergies, medications, problem list, medical history, surgical history, family history, social history, and previous encounter notes.   I, Burt Knack, am acting as transcriptionist for Reuben Likes, MD.  I have reviewed the above documentation for accuracy and completeness, and I agree with the above. - Reuben Likes, MD

## 2022-06-08 ENCOUNTER — Other Ambulatory Visit (INDEPENDENT_AMBULATORY_CARE_PROVIDER_SITE_OTHER): Payer: Self-pay | Admitting: Family Medicine

## 2022-06-08 DIAGNOSIS — E119 Type 2 diabetes mellitus without complications: Secondary | ICD-10-CM

## 2022-06-13 ENCOUNTER — Ambulatory Visit
Admission: EM | Admit: 2022-06-13 | Discharge: 2022-06-13 | Disposition: A | Discharge status: Home / Self Care | Payer: Medicaid Other | Attending: Family Medicine | Admitting: Family Medicine

## 2022-06-13 DIAGNOSIS — R102 Pelvic and perineal pain: Secondary | ICD-10-CM

## 2022-06-13 DIAGNOSIS — N3000 Acute cystitis without hematuria: Secondary | ICD-10-CM | POA: Diagnosis not present

## 2022-06-13 DIAGNOSIS — R300 Dysuria: Secondary | ICD-10-CM | POA: Diagnosis not present

## 2022-06-13 LAB — POCT URINALYSIS DIP (MANUAL ENTRY)
Bilirubin, UA: NEGATIVE
Blood, UA: NEGATIVE
Glucose, UA: NEGATIVE mg/dL
Ketones, POC UA: NEGATIVE mg/dL
Nitrite, UA: NEGATIVE
Protein Ur, POC: NEGATIVE mg/dL
Spec Grav, UA: 1.02 (ref 1.010–1.025)
Urobilinogen, UA: 0.2 E.U./dL
pH, UA: 7 (ref 5.0–8.0)

## 2022-06-13 MED ORDER — NITROFURANTOIN MONOHYD MACRO 100 MG PO CAPS
100.0000 mg | ORAL_CAPSULE | Freq: Two times a day (BID) | ORAL | 0 refills | Status: AC
Start: 2022-06-13 — End: 2022-06-20

## 2022-06-13 MED ORDER — FLUCONAZOLE 150 MG PO TABS: 150.0000 mg | ORAL_TABLET | Freq: Once | ORAL | 0 refills | Status: AC
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
## 2022-06-13 NOTE — ED Triage Notes (Signed)
Pt recently has anew sex partner and has been having left pelvic pain with urination pain. No blood in her urine, but there is a slight odor in her urine.

## 2022-06-13 NOTE — Discharge Instructions (Signed)
Urine culture is pending however I am placing you on a 7-day course of Macrobid because I do suspect her urinary tract infection has returned.  Also treating you with Diflucan for suspected yeast. The vaginal cytology collected here in office today will result with in 3 to 4 days.  We will notify you by phone or via MyChart only if your results are abnormal and require any treatment.  If any of your symptoms worsen or become severe go to the nearest emergency department.

## 2022-06-14 LAB — URINE CULTURE

## 2022-06-15 LAB — URINE CULTURE
Culture: >=100000 — AB
Special Requests: NORMAL

## 2022-06-16 ENCOUNTER — Other Ambulatory Visit: Payer: Self-pay | Admitting: Family Medicine

## 2022-06-16 MED ORDER — NITROFURANTOIN MONOHYD MACRO 100 MG PO CAPS: 100.0000 mg | ORAL_CAPSULE | Freq: Two times a day (BID) | ORAL | 0 refills | Status: AC

## 2022-06-17 ENCOUNTER — Telehealth: Payer: Self-pay | Admitting: Emergency Medicine

## 2022-06-17 LAB — CERVICOVAGINAL ANCILLARY ONLY
Bacterial Vaginitis (gardnerella): POSITIVE — AB
Candida Glabrata: NEGATIVE
Candida Vaginitis: NEGATIVE
Chlamydia: NEGATIVE
Comment: NEGATIVE
Comment: NEGATIVE
Comment: NEGATIVE
Comment: NEGATIVE
Comment: NEGATIVE
Comment: NORMAL
Neisseria Gonorrhea: NEGATIVE
Trichomonas: NEGATIVE

## 2022-06-17 MED ORDER — METRONIDAZOLE 500 MG PO TABS: 500.0000 mg | ORAL_TABLET | Freq: Two times a day (BID) | ORAL | 0 refills | Status: DC

## 2022-06-19 ENCOUNTER — Encounter (INDEPENDENT_AMBULATORY_CARE_PROVIDER_SITE_OTHER): Payer: Self-pay | Admitting: Family Medicine

## 2022-06-19 ENCOUNTER — Ambulatory Visit (INDEPENDENT_AMBULATORY_CARE_PROVIDER_SITE_OTHER): Payer: Medicaid Other | Admitting: Family Medicine

## 2022-06-19 VITALS — BP 103/58 | HR 60 | Temp 97.7°F | Ht 66.0 in | Wt 173.0 lb

## 2022-06-19 DIAGNOSIS — Z6827 Body mass index (BMI) 27.0-27.9, adult: Secondary | ICD-10-CM | POA: Diagnosis not present

## 2022-06-19 DIAGNOSIS — Z6828 Body mass index (BMI) 28.0-28.9, adult: Secondary | ICD-10-CM

## 2022-06-19 DIAGNOSIS — B9689 Other specified bacterial agents as the cause of diseases classified elsewhere: Secondary | ICD-10-CM | POA: Diagnosis not present

## 2022-06-19 DIAGNOSIS — Z7985 Long-term (current) use of injectable non-insulin antidiabetic drugs: Secondary | ICD-10-CM

## 2022-06-19 DIAGNOSIS — E669 Obesity, unspecified: Secondary | ICD-10-CM | POA: Diagnosis not present

## 2022-06-19 DIAGNOSIS — N760 Acute vaginitis: Secondary | ICD-10-CM | POA: Diagnosis not present

## 2022-06-19 DIAGNOSIS — Z638 Other specified problems related to primary support group: Secondary | ICD-10-CM | POA: Diagnosis not present

## 2022-06-19 DIAGNOSIS — E119 Type 2 diabetes mellitus without complications: Secondary | ICD-10-CM | POA: Diagnosis not present

## 2022-06-19 MED ORDER — OZEMPIC (0.25 OR 0.5 MG/DOSE) 2 MG/3ML ~~LOC~~ SOPN: 0.2500 mg | PEN_INJECTOR | SUBCUTANEOUS | 0 refills | Status: DC

## 2022-06-19 MED ORDER — SERTRALINE HCL 25 MG PO TABS
25.0000 mg | ORAL_TABLET | Freq: Every day | ORAL | 0 refills | Status: DC
Start: 2022-06-19 — End: 2022-11-12

## 2022-06-19 NOTE — Progress Notes (Signed)
Chief Complaint:   OBESITY Kristen Moreno is here to discuss her progress with her obesity treatment plan along with follow-up of her obesity related diagnoses. Kristen Moreno is on practicing portion control and making smarter food choices, such as increasing vegetables and decreasing simple carbohydrates and states she is following her eating plan approximately 50% of the time. Kristen Moreno states she is not exercising.  Today's visit was #: 5 Starting weight: 191 LBS Starting date: 03/27/2022 Today's weight: 173 LBS Today's date: 06/19/2022 Total lbs lost to date: 18 lbs Total lbs lost since last in-office visit: 5 lbs  Interim History: Patient has been only been able to tolerate minimal amounts of food due to nausea and vomiting.  She thinks perhaps this is related to her antibiotic for her UTI.  She is currently on Macrobid and Flagyl.  She unfortunately couldn't find the Category plan after moving.  Patient has numerous doctors appointments coming up and cataract surgery.  She is scared a bit of the upcoming surgery.    Subjective:   1. Stress due to family tension Patient managing with her daughter and somewhat volatile relationship she is experiencing.  Daughter will be going to mental health center over the summer.  2. Type 2 diabetes mellitus without complication, without long-term current use of insulin (HCC) Patient is on Ozempic weekly.  No GI side effects noted on medication.  3. Bacterial vaginosis Patient is currently taking Flagyl 2 times daily.  She is experiencing some GI side effects.  Assessment/Plan:   1. Stress due to family tension Refill- sertraline (ZOLOFT) 25 MG tablet; Take 1 tablet (25 mg total) by mouth daily.  Dispense: 90 tablet; Refill: 0  2. Type 2 diabetes mellitus without complication, without long-term current use of insulin (HCC) Refill- Semaglutide,0.25 or 0.5MG /DOS, (OZEMPIC, 0.25 OR 0.5 MG/DOSE,) 2 MG/3ML SOPN; Inject 0.25 mg into the skin once a week.   Dispense: 3 mL; Refill: 0  3. Bacterial vaginosis Patient to reach out to prescriber for transition to suppository.  4. BMI 28.0-28.9,adult  5. Obesity with starting BMI of 31.9 Kristen Moreno is currently in the action stage of change. As such, her goal is to continue with weight loss efforts. She has agreed to the Category 2 Plan.   Exercise goals: No exercise has been prescribed at this time.  Behavioral modification strategies: increasing lean protein intake, meal planning and cooking strategies, keeping healthy foods in the home, and planning for success.  Kristen Moreno has agreed to follow-up with our clinic in 3 weeks. She was informed of the importance of frequent follow-up visits to maximize her success with intensive lifestyle modifications for her multiple health conditions.   Objective:   Blood pressure (!) 103/58, pulse 60, temperature 97.7 F (36.5 C), height 5\' 6"  (1.676 m), weight 173 lb (78.5 kg), SpO2 100 %. Body mass index is 27.92 kg/m.  General: Cooperative, alert, well developed, in no acute distress. HEENT: Conjunctivae and lids unremarkable. Cardiovascular: Regular rhythm.  Lungs: Normal work of breathing. Neurologic: No focal deficits.   Lab Results  Component Value Date   CREATININE 1.20 (H) 05/14/2022   BUN 12 05/14/2022   NA 143 05/14/2022   K 3.5 05/14/2022   CL 105 05/14/2022   CO2 22 05/14/2022   Lab Results  Component Value Date   ALT 10 05/14/2022   AST 14 05/14/2022   ALKPHOS 71 05/14/2022   BILITOT 0.3 05/14/2022   Lab Results  Component Value Date   HGBA1C 7.4 (H) 03/27/2022  HGBA1C 6.8 (A) 07/31/2021   HGBA1C 7.6 (A) 01/23/2021   HGBA1C 6.8 (A) 07/30/2020   Lab Results  Component Value Date   INSULIN 10.7 03/27/2022   Lab Results  Component Value Date   TSH 0.830 03/27/2022   Lab Results  Component Value Date   CHOL 163 03/27/2022   HDL 55 03/27/2022   LDLCALC 87 03/27/2022   TRIG 121 03/27/2022   CHOLHDL 5.1 (H)  07/03/2015   Lab Results  Component Value Date   VD25OH 21.9 (L) 03/27/2022   VD25OH 13.1 (L) 04/01/2017   VD25OH 33 06/25/2009   Lab Results  Component Value Date   WBC 5.5 05/14/2022   HGB 12.4 05/14/2022   HCT 38.8 05/14/2022   MCV 91 05/14/2022   PLT 292 05/14/2022   Lab Results  Component Value Date   IRON 45 12/02/2017   TIBC 262 12/02/2017   FERRITIN 1,202 (H) 01/10/2021   Attestation Statements:   Reviewed by clinician on day of visit: allergies, medications, problem list, medical history, surgical history, family history, social history, and previous encounter notes.  I, Malcolm Metro, RMA, am acting as transcriptionist for Reuben Likes, MD.  I have reviewed the above documentation for accuracy and completeness, and I agree with the above. - Reuben Likes, MD

## 2022-06-20 ENCOUNTER — Telehealth (HOSPITAL_COMMUNITY): Payer: Self-pay | Admitting: Emergency Medicine

## 2022-06-20 NOTE — Telephone Encounter (Signed)
Patient called and states she is confused about her prescriptions as she has had many sent in the past few days.  All of our medicines were sent to [facility], per the computer, and patient was updated on this.  Also went through all three (Macrobid, Diflucan and Metronidazole) and explained how to take each one.  Patient is still confused, so I encouraged her to take her bottles up to the pharmacy when she goes to pick up the rest of her medicines and let them review with her.  She verbalized understanding

## 2022-06-24 ENCOUNTER — Encounter: Payer: Self-pay | Admitting: *Deleted

## 2022-06-28 DIAGNOSIS — Z79899 Other long term (current) drug therapy: Secondary | ICD-10-CM | POA: Diagnosis not present

## 2022-06-28 DIAGNOSIS — M25511 Pain in right shoulder: Secondary | ICD-10-CM | POA: Diagnosis not present

## 2022-06-28 DIAGNOSIS — M25551 Pain in right hip: Secondary | ICD-10-CM | POA: Diagnosis not present

## 2022-06-28 DIAGNOSIS — E663 Overweight: Secondary | ICD-10-CM | POA: Diagnosis not present

## 2022-06-28 DIAGNOSIS — M549 Dorsalgia, unspecified: Secondary | ICD-10-CM | POA: Diagnosis not present

## 2022-06-28 DIAGNOSIS — M25512 Pain in left shoulder: Secondary | ICD-10-CM | POA: Diagnosis not present

## 2022-06-28 DIAGNOSIS — E559 Vitamin D deficiency, unspecified: Secondary | ICD-10-CM | POA: Diagnosis not present

## 2022-06-28 DIAGNOSIS — E119 Type 2 diabetes mellitus without complications: Secondary | ICD-10-CM | POA: Diagnosis not present

## 2022-06-28 DIAGNOSIS — Z940 Kidney transplant status: Secondary | ICD-10-CM | POA: Diagnosis not present

## 2022-07-02 DIAGNOSIS — Z79899 Other long term (current) drug therapy: Secondary | ICD-10-CM | POA: Diagnosis not present

## 2022-07-03 DIAGNOSIS — H2511 Age-related nuclear cataract, right eye: Secondary | ICD-10-CM | POA: Diagnosis not present

## 2022-07-03 DIAGNOSIS — H25811 Combined forms of age-related cataract, right eye: Secondary | ICD-10-CM | POA: Diagnosis not present

## 2022-07-05 ENCOUNTER — Other Ambulatory Visit: Payer: Self-pay

## 2022-07-08 DIAGNOSIS — H25812 Combined forms of age-related cataract, left eye: Secondary | ICD-10-CM | POA: Diagnosis not present

## 2022-07-09 ENCOUNTER — Encounter (INDEPENDENT_AMBULATORY_CARE_PROVIDER_SITE_OTHER): Payer: Self-pay

## 2022-07-09 ENCOUNTER — Ambulatory Visit (INDEPENDENT_AMBULATORY_CARE_PROVIDER_SITE_OTHER): Payer: Medicare Other | Admitting: Family Medicine

## 2022-07-10 ENCOUNTER — Ambulatory Visit: Payer: Medicaid Other | Admitting: Student

## 2022-07-10 ENCOUNTER — Other Ambulatory Visit: Payer: Self-pay

## 2022-07-10 ENCOUNTER — Encounter: Payer: Self-pay | Admitting: Student

## 2022-07-10 VITALS — BP 121/63 | HR 91 | Temp 97.7°F | Ht 66.0 in | Wt 177.5 lb

## 2022-07-10 DIAGNOSIS — N390 Urinary tract infection, site not specified: Secondary | ICD-10-CM | POA: Diagnosis not present

## 2022-07-10 DIAGNOSIS — R110 Nausea: Secondary | ICD-10-CM

## 2022-07-10 DIAGNOSIS — Z0000 Encounter for general adult medical examination without abnormal findings: Secondary | ICD-10-CM

## 2022-07-10 DIAGNOSIS — I10 Essential (primary) hypertension: Secondary | ICD-10-CM | POA: Diagnosis not present

## 2022-07-10 DIAGNOSIS — E089 Diabetes mellitus due to underlying condition without complications: Secondary | ICD-10-CM

## 2022-07-10 DIAGNOSIS — B952 Enterococcus as the cause of diseases classified elsewhere: Secondary | ICD-10-CM

## 2022-07-10 DIAGNOSIS — E119 Type 2 diabetes mellitus without complications: Secondary | ICD-10-CM

## 2022-07-10 DIAGNOSIS — Z1231 Encounter for screening mammogram for malignant neoplasm of breast: Secondary | ICD-10-CM

## 2022-07-10 LAB — POCT GLYCOSYLATED HEMOGLOBIN (HGB A1C): Hemoglobin A1C: 6.1 % — AB (ref 4.0–5.6)

## 2022-07-10 LAB — GLUCOSE, CAPILLARY: Glucose-Capillary: 138 mg/dL — ABNORMAL HIGH (ref 70–99)

## 2022-07-10 MED ORDER — ONDANSETRON HCL 4 MG PO TABS: 4.0000 mg | ORAL_TABLET | Freq: Three times a day (TID) | ORAL | 0 refills | Status: DC | PRN

## 2022-07-10 NOTE — Assessment & Plan Note (Addendum)
Patient experienced dysuria and decreased urination in 03/2022, found to have E coli UTI and was treated with 7-day course of cefdinir. Last month, she had similar symptoms and was diagnosed with E faecalis UTI. She was treated with 10-day course of antibiotics. She states that her symptoms improved with this course.   A few days ago, she began noticing episodic burning when urinating. She denies any increased urinary frequency or other urinary symptoms. Denies any fevers or chills. Given symptoms, will check UA with reflex to culture to assess for any recurrence of infection.   Plan: -f/u UA with reflex to culture -treat if necessary  ADDENDUM: UA with trace leukocytes, negative nitrites. Microscopic exam showing 11-30 WBCs/hpf and many bacteria, but does have >10 epithelial cells so not a clean catch. Urine culture showing E faecalis UTI (>100K CFUs/mL). It is sensitive to macrobid and ciprofloxacin but resistant to penicillins. Given she is symptomatic, will treat as UTI with nitrofurantoin 100mg  BID for 5-day course. Discussed with patient and daughter, they are in agreement with plan.

## 2022-07-10 NOTE — Assessment & Plan Note (Signed)
History of T2DM. She is currently on ozempic therapy. She was not able to tolerate 0.25mg  weekly dose as this was causing some GI side effects. [facility] who decreased her to 8 clicks of ozempic instead of full 0.25mg  dose. She is tolerating this better. A1c improved from 7.2% to 6.1%. She denies any symptoms of hypoglycemia at this time.   Plan: -continue ozempic -f/u in 3 months for repeat A1c
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
## 2022-07-10 NOTE — Assessment & Plan Note (Signed)
Due for screening mammogram to screen for breast cancer. Ordered today.

## 2022-07-10 NOTE — Assessment & Plan Note (Signed)
BP 121/63 in clinic today. On norvasc 10mg  daily. Denies any lightheadedness or dizziness.  -continue norvasc

## 2022-07-10 NOTE — Patient Instructions (Addendum)
Kristen Moreno,  It was a pleasure seeing you in the clinic today.   Your A1c looks really good so keep up the good work! I have refilled your zofran to help with nausea. Please do not use this regularly as we discussed. Only use it when needed. I have ordered your mammogram. They will call you to schedule this. We are checking your urine for infection given your symptoms. I will call you when this comes back. Please come back in 3 months for your next visit (or sooner if needed).  Please call our clinic at [phone] if you have any questions or concerns. The best time to call is Monday-Friday from 9am-4pm, but there is someone available 24/7 at the same number. If you need medication refills, please notify your pharmacy one week in advance and they will send [place] a request.   Thank you for letting us take part in your care. We look forward to seeing you next time!

## 2022-07-11 LAB — URINE CULTURE, COMPREHENSIVE

## 2022-07-11 LAB — UA/M W/RFLX CULTURE, COMP: Bilirubin, UA: NEGATIVE

## 2022-07-13 LAB — UA/M W/RFLX CULTURE, COMP
Glucose, UA: NEGATIVE
RBC, UA: NEGATIVE
Specific Gravity, UA: 1.026 (ref 1.005–1.030)
pH, UA: 5.5 (ref 5.0–7.5)

## 2022-07-13 LAB — MICROSCOPIC EXAMINATION
Casts: NONE SEEN /LPF
Epithelial Cells (non renal): >10 /hpf — AB (ref 0–10)
RBC, Urine: NONE SEEN /HPF (ref 0–2)

## 2022-07-13 LAB — URINE CULTURE, COMPREHENSIVE

## 2022-07-13 NOTE — Addendum Note (Signed)
Addended by: Dickie La on: 07/13/2022 11:59 AM   Modules accepted: Level of Service

## 2022-07-13 NOTE — Progress Notes (Signed)
Internal Medicine Clinic Attending  Case discussed with Dr. Jinwala  At the time of the visit.  We reviewed the resident's history and exam and pertinent patient test results.  I agree with the assessment, diagnosis, and plan of care documented in the resident's note.  

## 2022-07-14 ENCOUNTER — Encounter (INDEPENDENT_AMBULATORY_CARE_PROVIDER_SITE_OTHER): Payer: Self-pay | Admitting: Family Medicine

## 2022-07-14 ENCOUNTER — Ambulatory Visit (INDEPENDENT_AMBULATORY_CARE_PROVIDER_SITE_OTHER): Payer: Medicaid Other | Admitting: Family Medicine

## 2022-07-14 VITALS — BP 114/64 | HR 85 | Temp 97.9°F | Ht 66.0 in | Wt 171.0 lb

## 2022-07-14 DIAGNOSIS — E119 Type 2 diabetes mellitus without complications: Secondary | ICD-10-CM

## 2022-07-14 DIAGNOSIS — E559 Vitamin D deficiency, unspecified: Secondary | ICD-10-CM

## 2022-07-14 DIAGNOSIS — E669 Obesity, unspecified: Secondary | ICD-10-CM | POA: Diagnosis not present

## 2022-07-14 DIAGNOSIS — Z7985 Long-term (current) use of injectable non-insulin antidiabetic drugs: Secondary | ICD-10-CM | POA: Diagnosis not present

## 2022-07-14 DIAGNOSIS — Z6827 Body mass index (BMI) 27.0-27.9, adult: Secondary | ICD-10-CM | POA: Diagnosis not present

## 2022-07-14 MED ORDER — OZEMPIC (0.25 OR 0.5 MG/DOSE) 2 MG/3ML ~~LOC~~ SOPN: 0.2500 mg | PEN_INJECTOR | SUBCUTANEOUS | 0 refills | Status: DC

## 2022-07-14 MED ORDER — VITAMIN D (ERGOCALCIFEROL) 1.25 MG (50000 UNIT) PO CAPS: 50000.0000 [IU] | ORAL_CAPSULE | ORAL | 0 refills | Status: DC

## 2022-07-14 NOTE — Progress Notes (Signed)
Chief Complaint:   OBESITY Kristen Moreno is here to discuss her progress with her obesity treatment plan along with follow-up of her obesity related diagnoses. Kristen Moreno is on the Category 2 Plan and states she is following her eating plan approximately 50% of the time. Kristen Moreno states she is walking for 30 minutes 3 times per week.  Today's visit was #: 6 Starting weight: 191 lbs Starting date: 03/27/2022 Today's weight: 171 lbs Today's date: 07/14/2022 Total lbs lost to date: 20 Total lbs lost since last in-office visit: 2  Interim History: Patient has been mostly working since last appointment.  Got her eye surgery and planning to get the next one done next week. Since last appointment she is eating mostly the microwavable meals and drinking lots of water with occasional fruit. When she is working she is buying the strawberry and chocolate protein shakes to help with her protein amount. No plans for 4th of July except recovering from her eye surgery.   Subjective:   1. Vitamin D deficiency Patient's last vitamin D level was of 21.9.  She denies nausea, vomiting, or muscle weakness but notes fatigue.  2. Type 2 diabetes mellitus without complication, without long-term current use of insulin (HCC) Patient's last A1c was 6.1 from last week (improved from 7.4).  No GI side effects or any side effects were noted.  Assessment/Plan:   1. Vitamin D deficiency Patient will continue prescription vitamin D once weekly, and we will refill for 1 month.  - Vitamin D, Ergocalciferol, (DRISDOL) 1.25 MG (50000 UNIT) CAPS capsule; Take 1 capsule (50,000 Units total) by mouth every 7 (seven) days.  Dispense: 4 capsule; Refill: 0  2. Type 2 diabetes mellitus without complication, without long-term current use of insulin (HCC) Patient will continue Ozempic 0.25 mg, and we will refill for 1 month.  - Semaglutide,0.25 or 0.5MG /DOS, (OZEMPIC, 0.25 OR 0.5 MG/DOSE,) 2 MG/3ML SOPN; Inject 0.25 mg into the  skin once a week.  Dispense: 3 mL; Refill: 0  3. BMI 27.0-27.9,adult  4. Obesity with starting BMI of 31.9 Kristen Moreno is currently in the action stage of change. As such, her goal is to continue with weight loss efforts. She has agreed to the Category 2 Plan.   Exercise goals: As is.   Behavioral modification strategies: increasing lean protein intake, meal planning and cooking strategies, keeping healthy foods in the home, and planning for success.  Kristen Moreno has agreed to follow-up with our clinic in 3 weeks. She was informed of the importance of frequent follow-up visits to maximize her success with intensive lifestyle modifications for her multiple health conditions.   Objective:   Blood pressure 114/64, pulse 85, temperature 97.9 F (36.6 C), height 5\' 6"  (1.676 m), weight 171 lb (77.6 kg), SpO2 99 %. Body mass index is 27.6 kg/m.  General: Cooperative, alert, well developed, in no acute distress. HEENT: Conjunctivae and lids unremarkable. Cardiovascular: Regular rhythm.  Lungs: Normal work of breathing. Neurologic: No focal deficits.   Lab Results  Component Value Date   CREATININE 1.20 (H) 05/14/2022   BUN 12 05/14/2022   NA 143 05/14/2022   K 3.5 05/14/2022   CL 105 05/14/2022   CO2 22 05/14/2022   Lab Results  Component Value Date   ALT 10 05/14/2022   AST 14 05/14/2022   ALKPHOS 71 05/14/2022   BILITOT 0.3 05/14/2022   Lab Results  Component Value Date   HGBA1C 6.1 (A) 07/10/2022   HGBA1C 7.4 (H) 03/27/2022  HGBA1C 6.8 (A) 07/31/2021   HGBA1C 7.6 (A) 01/23/2021   HGBA1C 6.8 (A) 07/30/2020   Lab Results  Component Value Date   INSULIN 10.7 03/27/2022   Lab Results  Component Value Date   TSH 0.830 03/27/2022   Lab Results  Component Value Date   CHOL 163 03/27/2022   HDL 55 03/27/2022   LDLCALC 87 03/27/2022   TRIG 121 03/27/2022   CHOLHDL 5.1 (H) 07/03/2015   Lab Results  Component Value Date   VD25OH 21.9 (L) 03/27/2022   VD25OH 13.1 (L)  04/01/2017   VD25OH 33 06/25/2009   Lab Results  Component Value Date   WBC 5.5 05/14/2022   HGB 12.4 05/14/2022   HCT 38.8 05/14/2022   MCV 91 05/14/2022   PLT 292 05/14/2022   Lab Results  Component Value Date   IRON 45 12/02/2017   TIBC 262 12/02/2017   FERRITIN 1,202 (H) 01/10/2021   Attestation Statements:   Reviewed by clinician on day of visit: allergies, medications, problem list, medical history, surgical history, family history, social history, and previous encounter notes.   I, Burt Knack, am acting as transcriptionist for Reuben Likes, MD.  I have reviewed the above documentation for accuracy and completeness, and I agree with the above. - Reuben Likes, MD

## 2022-07-16 LAB — UA/M W/RFLX CULTURE, COMP
Nitrite, UA: NEGATIVE
Urobilinogen, Ur: 0.2 mg/dL (ref 0.2–1.0)

## 2022-07-16 LAB — URINE CULTURE, COMPREHENSIVE

## 2022-07-16 LAB — MICROSCOPIC EXAMINATION

## 2022-07-16 MED ORDER — NITROFURANTOIN MONOHYD MACRO 100 MG PO CAPS
100.0000 mg | ORAL_CAPSULE | Freq: Two times a day (BID) | ORAL | 0 refills | Status: DC
Start: 2022-07-16 — End: 2022-08-12

## 2022-07-16 NOTE — Addendum Note (Signed)
Addended by: Merrilyn Puma on: 07/16/2022 04:30 PM   Modules accepted: Orders

## 2022-07-16 NOTE — Progress Notes (Addendum)
Patient called.  Left message for patient to call back. UA with trace leukocytes, negative nitrites. Microscopic exam showing 11-30 WBCs/hpf and many bacteria, but does have >10 epithelial cells so not a clean catch. Urine culture showing E faecalis UTI (>100K CFUs/mL). It is sensitive to macrobid and ciprofloxacin but resistant to penicillins. Given she is symptomatic, will treat as UTI with nitrofurantoin 100mg  BID for 5-day course.  ADDENDUM: Discussed findings with patient and daughter via phone call. They are in agreement with plan. Macrobid sent over to her pharmacy.

## 2022-07-17 ENCOUNTER — Telehealth: Payer: Self-pay

## 2022-07-17 NOTE — Telephone Encounter (Signed)
Requesting test results, please call pt back.  

## 2022-07-22 DIAGNOSIS — H2512 Age-related nuclear cataract, left eye: Secondary | ICD-10-CM | POA: Diagnosis not present

## 2022-07-22 DIAGNOSIS — H25812 Combined forms of age-related cataract, left eye: Secondary | ICD-10-CM | POA: Diagnosis not present

## 2022-07-23 ENCOUNTER — Other Ambulatory Visit (INDEPENDENT_AMBULATORY_CARE_PROVIDER_SITE_OTHER): Payer: Self-pay | Admitting: Family Medicine

## 2022-07-23 ENCOUNTER — Encounter (HOSPITAL_COMMUNITY): Payer: Self-pay

## 2022-07-23 DIAGNOSIS — E119 Type 2 diabetes mellitus without complications: Secondary | ICD-10-CM

## 2022-07-25 ENCOUNTER — Ambulatory Visit
Admission: RE | Admit: 2022-07-25 | Discharge: 2022-07-25 | Disposition: A | Discharge status: Home / Self Care | Payer: Medicaid Other | Source: Ambulatory Visit | Attending: Internal Medicine | Admitting: Internal Medicine

## 2022-07-25 DIAGNOSIS — Z1231 Encounter for screening mammogram for malignant neoplasm of breast: Secondary | ICD-10-CM

## 2022-07-26 DIAGNOSIS — M25551 Pain in right hip: Secondary | ICD-10-CM | POA: Diagnosis not present

## 2022-07-26 DIAGNOSIS — E663 Overweight: Secondary | ICD-10-CM | POA: Diagnosis not present

## 2022-07-26 DIAGNOSIS — E119 Type 2 diabetes mellitus without complications: Secondary | ICD-10-CM | POA: Diagnosis not present

## 2022-07-26 DIAGNOSIS — M549 Dorsalgia, unspecified: Secondary | ICD-10-CM | POA: Diagnosis not present

## 2022-07-26 DIAGNOSIS — M25511 Pain in right shoulder: Secondary | ICD-10-CM | POA: Diagnosis not present

## 2022-07-26 DIAGNOSIS — Z79899 Other long term (current) drug therapy: Secondary | ICD-10-CM | POA: Diagnosis not present

## 2022-07-26 DIAGNOSIS — E559 Vitamin D deficiency, unspecified: Secondary | ICD-10-CM | POA: Diagnosis not present

## 2022-07-26 DIAGNOSIS — Z780 Asymptomatic menopausal state: Secondary | ICD-10-CM | POA: Diagnosis not present

## 2022-07-26 DIAGNOSIS — Z940 Kidney transplant status: Secondary | ICD-10-CM | POA: Diagnosis not present

## 2022-07-26 DIAGNOSIS — M25512 Pain in left shoulder: Secondary | ICD-10-CM | POA: Diagnosis not present

## 2022-07-29 DIAGNOSIS — Z79899 Other long term (current) drug therapy: Secondary | ICD-10-CM | POA: Diagnosis not present

## 2022-08-01 DIAGNOSIS — Z780 Asymptomatic menopausal state: Secondary | ICD-10-CM | POA: Diagnosis not present

## 2022-08-05 ENCOUNTER — Other Ambulatory Visit (INDEPENDENT_AMBULATORY_CARE_PROVIDER_SITE_OTHER): Payer: Self-pay | Admitting: Family Medicine

## 2022-08-05 DIAGNOSIS — E559 Vitamin D deficiency, unspecified: Secondary | ICD-10-CM

## 2022-08-12 ENCOUNTER — Encounter (INDEPENDENT_AMBULATORY_CARE_PROVIDER_SITE_OTHER): Payer: Self-pay | Admitting: Family Medicine

## 2022-08-12 ENCOUNTER — Ambulatory Visit (INDEPENDENT_AMBULATORY_CARE_PROVIDER_SITE_OTHER): Payer: Medicaid Other | Admitting: Family Medicine

## 2022-08-12 VITALS — BP 123/69 | HR 78 | Temp 98.2°F | Ht 66.0 in | Wt 169.0 lb

## 2022-08-12 DIAGNOSIS — E559 Vitamin D deficiency, unspecified: Secondary | ICD-10-CM

## 2022-08-12 DIAGNOSIS — E1165 Type 2 diabetes mellitus with hyperglycemia: Secondary | ICD-10-CM

## 2022-08-12 DIAGNOSIS — E669 Obesity, unspecified: Secondary | ICD-10-CM

## 2022-08-12 DIAGNOSIS — Z6827 Body mass index (BMI) 27.0-27.9, adult: Secondary | ICD-10-CM | POA: Diagnosis not present

## 2022-08-12 DIAGNOSIS — Z7985 Long-term (current) use of injectable non-insulin antidiabetic drugs: Secondary | ICD-10-CM | POA: Diagnosis not present

## 2022-08-12 DIAGNOSIS — Z6828 Body mass index (BMI) 28.0-28.9, adult: Secondary | ICD-10-CM

## 2022-08-12 MED ORDER — VITAMIN D (ERGOCALCIFEROL) 1.25 MG (50000 UNIT) PO CAPS
50000.0000 [IU] | ORAL_CAPSULE | ORAL | 0 refills | Status: DC
Start: 2022-08-12 — End: 2022-09-09

## 2022-08-12 NOTE — Progress Notes (Unsigned)
Chief Complaint:   OBESITY Kristen Moreno is here to discuss her progress with her obesity treatment plan along with follow-up of her obesity related diagnoses. Kristen Moreno is on the Category 2 Plan and states she is following her eating plan approximately 25% of the time. Kristen Moreno states she is walking 1/2 mile 4 times per week.    Today's visit was #: 7 Starting weight: 191 lbs Starting date: 03/27/2022 Today's weight: 169 lbs Today's date: 08/12/2022 Total lbs lost to date: 22 Total lbs lost since last in-office visit: 2  Interim History: Patient had a small cookout for July 4th. She has been following meal plan 25% of the time due to work.  She has been stressing out as well.  She cooks every Sunday and eats breakfast only occasionally.  She isn't getting sick.  She has been making her fruit shakes daily and is walking 3-4 times a week.   Subjective:   1. Vitamin D deficiency Patient is on prescription vitamin D.  She denies nausea, vomiting, or muscle weakness but notes fatigue.  2. Type 2 diabetes mellitus with hyperglycemia, without long-term current use of insulin (HCC) Patient is on Ozempic once weekly, with no GI side effects noted.  Assessment/Plan:   1. Vitamin D deficiency Patient will continue prescription vitamin D once weekly, and we will refill for 1 month.  - Vitamin D, Ergocalciferol, (DRISDOL) 1.25 MG (50000 UNIT) CAPS capsule; Take 1 capsule (50,000 Units total) by mouth every 7 (seven) days.  Dispense: 4 capsule; Refill: 0  2. Type 2 diabetes mellitus with hyperglycemia, without long-term current use of insulin (HCC) Patient will continue Ozempic at 0.25 mg subcu weekly, no refill needed.  3. BMI 28.0-28.9,adult  4. Obesity with starting BMI of 31.9 Kristen Moreno is currently in the action stage of change. As such, her goal is to continue with weight loss efforts. She has agreed to keeping a food journal and adhering to recommended goals of 1100-1200 calories and  80+ grams of protein daily.   Exercise goals: All adults should avoid inactivity. Some physical activity is better than none, and adults who participate in any amount of physical activity gain some health benefits.  Behavioral modification strategies: increasing lean protein intake, meal planning and cooking strategies, keeping healthy foods in the home, planning for success, and keeping a strict food journal.  Kristen Moreno has agreed to follow-up with our clinic in 3 to 4 weeks. She was informed of the importance of frequent follow-up visits to maximize her success with intensive lifestyle modifications for her multiple health conditions.   Objective:   Blood pressure 123/69, pulse 78, temperature 98.2 F (36.8 C), height 5\' 6"  (1.676 m), weight 169 lb (76.7 kg), SpO2 93%. Body mass index is 27.28 kg/m.  General: Cooperative, alert, well developed, in no acute distress. HEENT: Conjunctivae and lids unremarkable. Cardiovascular: Regular rhythm.  Lungs: Normal work of breathing. Neurologic: No focal deficits.   Lab Results  Component Value Date   CREATININE 1.20 (H) 05/14/2022   BUN 12 05/14/2022   NA 143 05/14/2022   K 3.5 05/14/2022   CL 105 05/14/2022   CO2 22 05/14/2022   Lab Results  Component Value Date   ALT 10 05/14/2022   AST 14 05/14/2022   ALKPHOS 71 05/14/2022   BILITOT 0.3 05/14/2022   Lab Results  Component Value Date   HGBA1C 6.1 (A) 07/10/2022   HGBA1C 7.4 (H) 03/27/2022   HGBA1C 6.8 (A) 07/31/2021   HGBA1C 7.6 (A)  01/23/2021   HGBA1C 6.8 (A) 07/30/2020   Lab Results  Component Value Date   INSULIN 10.7 03/27/2022   Lab Results  Component Value Date   TSH 0.830 03/27/2022   Lab Results  Component Value Date   CHOL 163 03/27/2022   HDL 55 03/27/2022   LDLCALC 87 03/27/2022   TRIG 121 03/27/2022   CHOLHDL 5.1 (H) 07/03/2015   Lab Results  Component Value Date   VD25OH 21.9 (L) 03/27/2022   VD25OH 13.1 (L) 04/01/2017   VD25OH 33 06/25/2009    Lab Results  Component Value Date   WBC 5.5 05/14/2022   HGB 12.4 05/14/2022   HCT 38.8 05/14/2022   MCV 91 05/14/2022   PLT 292 05/14/2022   Lab Results  Component Value Date   IRON 45 12/02/2017   TIBC 262 12/02/2017   FERRITIN 1,202 (H) 01/10/2021   Attestation Statements:   Reviewed by clinician on day of visit: allergies, medications, problem list, medical history, surgical history, family history, social history, and previous encounter notes.   I, Burt Knack, am acting as transcriptionist for Reuben Likes, MD.  I have reviewed the above documentation for accuracy and completeness, and I agree with the above. - Reuben Likes, MD

## 2022-08-18 ENCOUNTER — Other Ambulatory Visit (INDEPENDENT_AMBULATORY_CARE_PROVIDER_SITE_OTHER): Payer: Self-pay | Admitting: Family Medicine

## 2022-08-18 DIAGNOSIS — E559 Vitamin D deficiency, unspecified: Secondary | ICD-10-CM

## 2022-08-21 DIAGNOSIS — N2581 Secondary hyperparathyroidism of renal origin: Secondary | ICD-10-CM | POA: Diagnosis not present

## 2022-08-21 DIAGNOSIS — Z940 Kidney transplant status: Secondary | ICD-10-CM | POA: Diagnosis not present

## 2022-08-23 DIAGNOSIS — E119 Type 2 diabetes mellitus without complications: Secondary | ICD-10-CM | POA: Diagnosis not present

## 2022-08-23 DIAGNOSIS — M25511 Pain in right shoulder: Secondary | ICD-10-CM | POA: Diagnosis not present

## 2022-08-23 DIAGNOSIS — M25512 Pain in left shoulder: Secondary | ICD-10-CM | POA: Diagnosis not present

## 2022-08-23 DIAGNOSIS — G8929 Other chronic pain: Secondary | ICD-10-CM | POA: Diagnosis not present

## 2022-08-23 DIAGNOSIS — E559 Vitamin D deficiency, unspecified: Secondary | ICD-10-CM | POA: Diagnosis not present

## 2022-08-23 DIAGNOSIS — M25551 Pain in right hip: Secondary | ICD-10-CM | POA: Diagnosis not present

## 2022-08-23 DIAGNOSIS — Z940 Kidney transplant status: Secondary | ICD-10-CM | POA: Diagnosis not present

## 2022-08-23 DIAGNOSIS — Z79899 Other long term (current) drug therapy: Secondary | ICD-10-CM | POA: Diagnosis not present

## 2022-08-23 DIAGNOSIS — M549 Dorsalgia, unspecified: Secondary | ICD-10-CM | POA: Diagnosis not present

## 2022-08-23 DIAGNOSIS — E663 Overweight: Secondary | ICD-10-CM | POA: Diagnosis not present

## 2022-08-26 DIAGNOSIS — Z79899 Other long term (current) drug therapy: Secondary | ICD-10-CM | POA: Diagnosis not present

## 2022-08-29 DIAGNOSIS — Z940 Kidney transplant status: Secondary | ICD-10-CM | POA: Diagnosis not present

## 2022-09-04 ENCOUNTER — Other Ambulatory Visit (INDEPENDENT_AMBULATORY_CARE_PROVIDER_SITE_OTHER): Payer: Self-pay | Admitting: Family Medicine

## 2022-09-04 DIAGNOSIS — E119 Type 2 diabetes mellitus without complications: Secondary | ICD-10-CM

## 2022-09-09 ENCOUNTER — Encounter (INDEPENDENT_AMBULATORY_CARE_PROVIDER_SITE_OTHER): Payer: Self-pay | Admitting: Family Medicine

## 2022-09-09 ENCOUNTER — Ambulatory Visit (INDEPENDENT_AMBULATORY_CARE_PROVIDER_SITE_OTHER): Payer: Medicaid Other | Admitting: Family Medicine

## 2022-09-09 VITALS — BP 139/72 | HR 82 | Temp 98.6°F | Ht 66.0 in | Wt 175.0 lb

## 2022-09-09 DIAGNOSIS — E559 Vitamin D deficiency, unspecified: Secondary | ICD-10-CM | POA: Diagnosis not present

## 2022-09-09 DIAGNOSIS — Z638 Other specified problems related to primary support group: Secondary | ICD-10-CM

## 2022-09-09 DIAGNOSIS — Z6828 Body mass index (BMI) 28.0-28.9, adult: Secondary | ICD-10-CM

## 2022-09-09 DIAGNOSIS — Z7985 Long-term (current) use of injectable non-insulin antidiabetic drugs: Secondary | ICD-10-CM

## 2022-09-09 DIAGNOSIS — E669 Obesity, unspecified: Secondary | ICD-10-CM | POA: Diagnosis not present

## 2022-09-09 DIAGNOSIS — E119 Type 2 diabetes mellitus without complications: Secondary | ICD-10-CM

## 2022-09-09 MED ORDER — OZEMPIC (0.25 OR 0.5 MG/DOSE) 2 MG/3ML ~~LOC~~ SOPN: 0.2500 mg | PEN_INJECTOR | SUBCUTANEOUS | 0 refills | Status: DC

## 2022-09-09 MED ORDER — VITAMIN D (ERGOCALCIFEROL) 1.25 MG (50000 UNIT) PO CAPS: 50000.0000 [IU] | ORAL_CAPSULE | ORAL | 0 refills | Status: DC

## 2022-09-09 NOTE — Progress Notes (Signed)
Chief Complaint:   OBESITY Kristen Moreno is here to discuss her progress with her obesity treatment plan along with follow-up of her obesity related diagnoses. Kristen Moreno is on keeping a food journal and adhering to recommended goals of 1100-1200 calories and 80+ grams of protein and states she is following her eating plan approximately 25% of the time. Kristen Moreno states she is walking for 15 minutes 4 times per week.  Today's visit was #: 8 Starting weight: 191 lbs Starting date: 03/27/2022 Today's weight: 175 lbs Today's date: 09/09/2022 Total lbs lost to date: 16 Total lbs lost since last in-office visit: 0  Interim History: Since last appointment patient has struggled to get on plan.  She mentions that she has been consistently eating off plan and mentions that she has been drinking shakes and doing smart ones dinners very inconsistently. She mentions her stress less has been consistently elevated.  Wants to get back on track over the next few weeks and recommit to plan.   Subjective:   1. Type 2 diabetes mellitus without complication, without long-term current use of insulin (HCC) Patient is on Ozempic weekly, and her most recent A1c was of 6.1.  2. Vitamin D deficiency Patient is on prescription vitamin D.  She denies nausea, vomiting, or muscle weakness but notes fatigue.  Assessment/Plan:   1. Type 2 diabetes mellitus without complication, without long-term current use of insulin (HCC) Patient will continue Ozempic with no change in dose or medication.  We will refill Ozempic 0.25 mg subcu weekly for 1 month.  - Semaglutide,0.25 or 0.5MG /DOS, (OZEMPIC, 0.25 OR 0.5 MG/DOSE,) 2 MG/3ML SOPN; Inject 0.25 mg into the skin once a week.  Dispense: 3 mL; Refill: 0  2. Vitamin D deficiency We will refill prescription vitamin D 50,000 IU once weekly for 1 month.  - Vitamin D, Ergocalciferol, (DRISDOL) 1.25 MG (50000 UNIT) CAPS capsule; Take 1 capsule (50,000 Units total) by mouth every  7 (seven) days.  Dispense: 4 capsule; Refill: 0  3. BMI 28.0-28.9,adult  4. Obesity with starting BMI of 31.9 Kristen Moreno is currently in the action stage of change. As such, her goal is to continue with weight loss efforts. She has agreed to keeping a food journal and adhering to recommended goals of 1100-1200 calories and 80+ grams of protein daily.   Exercise goals: All adults should avoid inactivity. Some physical activity is better than none, and adults who participate in any amount of physical activity gain some health benefits.  Behavioral modification strategies: increasing lean protein intake, meal planning and cooking strategies, planning for success, and keeping a strict food journal.  Kristen Moreno has agreed to follow-up with our clinic in 4 to 5 weeks. She was informed of the importance of frequent follow-up visits to maximize her success with intensive lifestyle modifications for her multiple health conditions.   Objective:   Blood pressure 139/72, pulse 82, temperature 98.6 F (37 C), height 5\' 6"  (1.676 m), weight 175 lb (79.4 kg), SpO2 96%. Body mass index is 28.25 kg/m.  General: Cooperative, alert, well developed, in no acute distress. HEENT: Conjunctivae and lids unremarkable. Cardiovascular: Regular rhythm.  Lungs: Normal work of breathing. Neurologic: No focal deficits.   Lab Results  Component Value Date   CREATININE 1.20 (H) 05/14/2022   BUN 12 05/14/2022   NA 143 05/14/2022   K 3.5 05/14/2022   CL 105 05/14/2022   CO2 22 05/14/2022   Lab Results  Component Value Date   ALT 10 05/14/2022  AST 14 05/14/2022   ALKPHOS 71 05/14/2022   BILITOT 0.3 05/14/2022   Lab Results  Component Value Date   HGBA1C 6.1 (A) 07/10/2022   HGBA1C 7.4 (H) 03/27/2022   HGBA1C 6.8 (A) 07/31/2021   HGBA1C 7.6 (A) 01/23/2021   HGBA1C 6.8 (A) 07/30/2020   Lab Results  Component Value Date   INSULIN 10.7 03/27/2022   Lab Results  Component Value Date   TSH 0.830  03/27/2022   Lab Results  Component Value Date   CHOL 163 03/27/2022   HDL 55 03/27/2022   LDLCALC 87 03/27/2022   TRIG 121 03/27/2022   CHOLHDL 5.1 (H) 07/03/2015   Lab Results  Component Value Date   VD25OH 21.9 (L) 03/27/2022   VD25OH 13.1 (L) 04/01/2017   VD25OH 33 06/25/2009   Lab Results  Component Value Date   WBC 5.5 05/14/2022   HGB 12.4 05/14/2022   HCT 38.8 05/14/2022   MCV 91 05/14/2022   PLT 292 05/14/2022   Lab Results  Component Value Date   IRON 45 12/02/2017   TIBC 262 12/02/2017   FERRITIN 1,202 (H) 01/10/2021   Attestation Statements:   Reviewed by clinician on day of visit: allergies, medications, problem list, medical history, surgical history, family history, social history, and previous encounter notes.   I, Burt Knack, am acting as transcriptionist for Reuben Likes, MD.  I have reviewed the above documentation for accuracy and completeness, and I agree with the above. - Reuben Likes, MD

## 2022-09-29 ENCOUNTER — Telehealth: Payer: Self-pay | Admitting: Student

## 2022-09-29 NOTE — Telephone Encounter (Signed)
Open in error

## 2022-10-01 ENCOUNTER — Encounter (HOSPITAL_COMMUNITY): Payer: Self-pay

## 2022-10-01 ENCOUNTER — Emergency Department (HOSPITAL_COMMUNITY): Payer: Medicare Other

## 2022-10-01 ENCOUNTER — Other Ambulatory Visit: Payer: Self-pay

## 2022-10-01 ENCOUNTER — Inpatient Hospital Stay (HOSPITAL_COMMUNITY)
Admission: EM | Admit: 2022-10-01 | Discharge: 2022-10-03 | DRG: 178 | Disposition: A | Discharge status: Home / Self Care | Payer: Medicare Other | Attending: Internal Medicine | Admitting: Internal Medicine

## 2022-10-01 ENCOUNTER — Encounter (HOSPITAL_COMMUNITY): Payer: Self-pay | Admitting: Internal Medicine

## 2022-10-01 DIAGNOSIS — Z8616 Personal history of COVID-19: Secondary | ICD-10-CM

## 2022-10-01 DIAGNOSIS — G4733 Obstructive sleep apnea (adult) (pediatric): Secondary | ICD-10-CM | POA: Diagnosis present

## 2022-10-01 DIAGNOSIS — Z888 Allergy status to other drugs, medicaments and biological substances status: Secondary | ICD-10-CM

## 2022-10-01 DIAGNOSIS — Z825 Family history of asthma and other chronic lower respiratory diseases: Secondary | ICD-10-CM

## 2022-10-01 DIAGNOSIS — R0602 Shortness of breath: Secondary | ICD-10-CM

## 2022-10-01 DIAGNOSIS — Z7985 Long-term (current) use of injectable non-insulin antidiabetic drugs: Secondary | ICD-10-CM

## 2022-10-01 DIAGNOSIS — Z8249 Family history of ischemic heart disease and other diseases of the circulatory system: Secondary | ICD-10-CM | POA: Diagnosis not present

## 2022-10-01 DIAGNOSIS — E785 Hyperlipidemia, unspecified: Secondary | ICD-10-CM | POA: Diagnosis present

## 2022-10-01 DIAGNOSIS — E872 Acidosis, unspecified: Secondary | ICD-10-CM | POA: Diagnosis present

## 2022-10-01 DIAGNOSIS — T8619 Other complication of kidney transplant: Secondary | ICD-10-CM | POA: Diagnosis present

## 2022-10-01 DIAGNOSIS — R5381 Other malaise: Secondary | ICD-10-CM | POA: Diagnosis present

## 2022-10-01 DIAGNOSIS — Z981 Arthrodesis status: Secondary | ICD-10-CM

## 2022-10-01 DIAGNOSIS — E860 Dehydration: Secondary | ICD-10-CM | POA: Diagnosis present

## 2022-10-01 DIAGNOSIS — R112 Nausea with vomiting, unspecified: Secondary | ICD-10-CM

## 2022-10-01 DIAGNOSIS — Z9079 Acquired absence of other genital organ(s): Secondary | ICD-10-CM

## 2022-10-01 DIAGNOSIS — Q613 Polycystic kidney, unspecified: Secondary | ICD-10-CM | POA: Diagnosis not present

## 2022-10-01 DIAGNOSIS — Z87891 Personal history of nicotine dependence: Secondary | ICD-10-CM

## 2022-10-01 DIAGNOSIS — Z940 Kidney transplant status: Secondary | ICD-10-CM | POA: Diagnosis not present

## 2022-10-01 DIAGNOSIS — Z8615 Personal history of latent tuberculosis infection: Secondary | ICD-10-CM

## 2022-10-01 DIAGNOSIS — E739 Lactose intolerance, unspecified: Secondary | ICD-10-CM | POA: Diagnosis present

## 2022-10-01 DIAGNOSIS — N1831 Chronic kidney disease, stage 3a: Secondary | ICD-10-CM | POA: Diagnosis not present

## 2022-10-01 DIAGNOSIS — N179 Acute kidney failure, unspecified: Secondary | ICD-10-CM | POA: Diagnosis not present

## 2022-10-01 DIAGNOSIS — E871 Hypo-osmolality and hyponatremia: Secondary | ICD-10-CM | POA: Diagnosis present

## 2022-10-01 DIAGNOSIS — Z90710 Acquired absence of both cervix and uterus: Secondary | ICD-10-CM

## 2022-10-01 DIAGNOSIS — N2581 Secondary hyperparathyroidism of renal origin: Secondary | ICD-10-CM | POA: Diagnosis present

## 2022-10-01 DIAGNOSIS — R0902 Hypoxemia: Secondary | ICD-10-CM | POA: Diagnosis present

## 2022-10-01 DIAGNOSIS — Z79621 Long term (current) use of calcineurin inhibitor: Secondary | ICD-10-CM

## 2022-10-01 DIAGNOSIS — E7800 Pure hypercholesterolemia, unspecified: Secondary | ICD-10-CM | POA: Diagnosis present

## 2022-10-01 DIAGNOSIS — K219 Gastro-esophageal reflux disease without esophagitis: Secondary | ICD-10-CM | POA: Diagnosis present

## 2022-10-01 DIAGNOSIS — U071 COVID-19: Principal | ICD-10-CM | POA: Diagnosis present

## 2022-10-01 DIAGNOSIS — I10 Essential (primary) hypertension: Secondary | ICD-10-CM | POA: Diagnosis present

## 2022-10-01 DIAGNOSIS — Y830 Surgical operation with transplant of whole organ as the cause of abnormal reaction of the patient, or of later complication, without mention of misadventure at the time of the procedure: Secondary | ICD-10-CM | POA: Diagnosis present

## 2022-10-01 DIAGNOSIS — E1122 Type 2 diabetes mellitus with diabetic chronic kidney disease: Secondary | ICD-10-CM | POA: Diagnosis present

## 2022-10-01 DIAGNOSIS — D631 Anemia in chronic kidney disease: Secondary | ICD-10-CM | POA: Diagnosis present

## 2022-10-01 DIAGNOSIS — J9811 Atelectasis: Secondary | ICD-10-CM | POA: Diagnosis present

## 2022-10-01 DIAGNOSIS — F419 Anxiety disorder, unspecified: Secondary | ICD-10-CM | POA: Diagnosis present

## 2022-10-01 DIAGNOSIS — Z87440 Personal history of urinary (tract) infections: Secondary | ICD-10-CM

## 2022-10-01 DIAGNOSIS — J449 Chronic obstructive pulmonary disease, unspecified: Secondary | ICD-10-CM | POA: Diagnosis present

## 2022-10-01 DIAGNOSIS — D849 Immunodeficiency, unspecified: Secondary | ICD-10-CM | POA: Diagnosis present

## 2022-10-01 DIAGNOSIS — I129 Hypertensive chronic kidney disease with stage 1 through stage 4 chronic kidney disease, or unspecified chronic kidney disease: Secondary | ICD-10-CM | POA: Diagnosis present

## 2022-10-01 DIAGNOSIS — Z8271 Family history of polycystic kidney: Secondary | ICD-10-CM

## 2022-10-01 DIAGNOSIS — E282 Polycystic ovarian syndrome: Secondary | ICD-10-CM | POA: Diagnosis present

## 2022-10-01 DIAGNOSIS — Z803 Family history of malignant neoplasm of breast: Secondary | ICD-10-CM

## 2022-10-01 DIAGNOSIS — Z90722 Acquired absence of ovaries, bilateral: Secondary | ICD-10-CM

## 2022-10-01 DIAGNOSIS — Z79899 Other long term (current) drug therapy: Secondary | ICD-10-CM

## 2022-10-01 LAB — CBC WITH DIFFERENTIAL/PLATELET
Abs Immature Granulocytes: 0.02 10*3/uL (ref 0.00–0.07)
Basophils Absolute: 0 10*3/uL (ref 0.0–0.1)
Basophils Relative: 0 %
Eosinophils Absolute: 0.1 10*3/uL (ref 0.0–0.5)
Eosinophils Relative: 3 %
HCT: 39.1 % (ref 36.0–46.0)
Hemoglobin: 12.3 g/dL (ref 12.0–15.0)
Immature Granulocytes: 0 %
Lymphocytes Relative: 8 %
Lymphs Abs: 0.4 10*3/uL — ABNORMAL LOW (ref 0.7–4.0)
MCH: 29 pg (ref 26.0–34.0)
MCHC: 31.5 g/dL (ref 30.0–36.0)
MCV: 92.2 fL (ref 80.0–100.0)
Monocytes Absolute: 0.2 10*3/uL (ref 0.1–1.0)
Monocytes Relative: 4 %
Neutro Abs: 4.2 10*3/uL (ref 1.7–7.7)
Neutrophils Relative %: 85 %
Platelets: 188 10*3/uL (ref 150–400)
RBC: 4.24 MIL/uL (ref 3.87–5.11)
RDW: 14.6 % (ref 11.5–15.5)
WBC: 5 10*3/uL (ref 4.0–10.5)
nRBC: 0 % (ref 0.0–0.2)

## 2022-10-01 LAB — COMPREHENSIVE METABOLIC PANEL
ALT: 17 U/L (ref 0–44)
AST: 17 U/L (ref 15–41)
Albumin: 3.3 g/dL — ABNORMAL LOW (ref 3.5–5.0)
Alkaline Phosphatase: 57 U/L (ref 38–126)
Anion gap: 13 (ref 5–15)
BUN: 21 mg/dL — ABNORMAL HIGH (ref 6–20)
CO2: 18 mmol/L — ABNORMAL LOW (ref 22–32)
Calcium: 8.6 mg/dL — ABNORMAL LOW (ref 8.9–10.3)
Chloride: 101 mmol/L (ref 98–111)
Creatinine, Ser: 1.49 mg/dL — ABNORMAL HIGH (ref 0.44–1.00)
GFR, Estimated: 40 mL/min — ABNORMAL LOW (ref >60)
Glucose, Bld: 102 mg/dL — ABNORMAL HIGH (ref 70–99)
Potassium: 3.6 mmol/L (ref 3.5–5.1)
Sodium: 132 mmol/L — ABNORMAL LOW (ref 135–145)
Total Bilirubin: 0.5 mg/dL (ref 0.3–1.2)
Total Protein: 7.6 g/dL (ref 6.5–8.1)

## 2022-10-01 LAB — URINALYSIS, W/ REFLEX TO CULTURE (INFECTION SUSPECTED)
Bilirubin Urine: NEGATIVE
Glucose, UA: NEGATIVE mg/dL
Hgb urine dipstick: NEGATIVE
Ketones, ur: 20 mg/dL — AB
Leukocytes,Ua: NEGATIVE
Nitrite: NEGATIVE
Protein, ur: 30 mg/dL — AB
Specific Gravity, Urine: 1.02 (ref 1.005–1.030)
pH: 5 (ref 5.0–8.0)

## 2022-10-01 LAB — RESP PANEL BY RT-PCR (RSV, FLU A&B, COVID)  RVPGX2
Influenza A by PCR: NEGATIVE
Influenza B by PCR: NEGATIVE
Resp Syncytial Virus by PCR: NEGATIVE
SARS Coronavirus 2 by RT PCR: POSITIVE — AB

## 2022-10-01 LAB — PROCALCITONIN: Procalcitonin: 0.72 ng/mL

## 2022-10-01 LAB — PROTIME-INR
INR: 1.1 (ref 0.8–1.2)
Prothrombin Time: 14.3 s (ref 11.4–15.2)

## 2022-10-01 LAB — I-STAT CG4 LACTIC ACID, ED: Lactic Acid, Venous: 1.3 mmol/L (ref 0.5–1.9)

## 2022-10-01 MED ORDER — SODIUM CHLORIDE 0.9 % IV SOLN: INTRAVENOUS | Status: DC

## 2022-10-01 MED ORDER — SODIUM CHLORIDE 0.9 % IV SOLN
1.0000 g | INTRAVENOUS | Status: DC
  Administered 2022-10-01 – 2022-10-02 (×2): 1 g via INTRAVENOUS
  Filled 2022-10-01 (×2): qty 10

## 2022-10-01 MED ORDER — ACETAMINOPHEN 650 MG RE SUPP: 650.0000 mg | Freq: Four times a day (QID) | RECTAL | Status: DC | PRN

## 2022-10-01 MED ORDER — ACETAMINOPHEN 500 MG PO TABS
1000.0000 mg | ORAL_TABLET | Freq: Once | ORAL | Status: AC
  Administered 2022-10-01: 1000 mg via ORAL
  Filled 2022-10-01: qty 2

## 2022-10-01 MED ORDER — PANTOPRAZOLE SODIUM 40 MG IV SOLR
40.0000 mg | INTRAVENOUS | Status: DC
  Administered 2022-10-01 – 2022-10-02 (×2): 40 mg via INTRAVENOUS
  Filled 2022-10-01 (×2): qty 10

## 2022-10-01 MED ORDER — HYDROMORPHONE HCL 1 MG/ML IJ SOLN
0.5000 mg | Freq: Once | INTRAMUSCULAR | Status: AC
  Administered 2022-10-01: 0.5 mg via INTRAVENOUS
  Filled 2022-10-01: qty 1

## 2022-10-01 MED ORDER — DEXAMETHASONE SODIUM PHOSPHATE 10 MG/ML IJ SOLN: 6.0000 mg | INTRAMUSCULAR | Status: DC

## 2022-10-01 MED ORDER — ONDANSETRON HCL 4 MG/2ML IJ SOLN
4.0000 mg | Freq: Four times a day (QID) | INTRAMUSCULAR | Status: DC | PRN
  Administered 2022-10-01: 4 mg via INTRAVENOUS
  Filled 2022-10-01: qty 2

## 2022-10-01 MED ORDER — ALBUTEROL SULFATE HFA 108 (90 BASE) MCG/ACT IN AERS: 2.0000 | INHALATION_SPRAY | RESPIRATORY_TRACT | Status: DC | PRN

## 2022-10-01 MED ORDER — SODIUM CHLORIDE 0.9 % IV SOLN
500.0000 mg | INTRAVENOUS | Status: DC
  Administered 2022-10-01 – 2022-10-02 (×2): 500 mg via INTRAVENOUS
  Filled 2022-10-01 (×2): qty 5

## 2022-10-01 MED ORDER — ONDANSETRON HCL 4 MG/2ML IJ SOLN
4.0000 mg | Freq: Once | INTRAMUSCULAR | Status: AC
  Administered 2022-10-01: 4 mg via INTRAVENOUS
  Filled 2022-10-01: qty 2

## 2022-10-01 MED ORDER — GUAIFENESIN ER 600 MG PO TB12
600.0000 mg | ORAL_TABLET | Freq: Two times a day (BID) | ORAL | Status: DC
  Administered 2022-10-01 – 2022-10-03 (×4): 600 mg via ORAL
  Filled 2022-10-01 (×4): qty 1

## 2022-10-01 MED ORDER — METHYLPREDNISOLONE SODIUM SUCC 40 MG IJ SOLR
40.0000 mg | INTRAMUSCULAR | Status: DC
  Administered 2022-10-01: 40 mg via INTRAVENOUS
  Filled 2022-10-01: qty 1

## 2022-10-01 MED ORDER — ALBUTEROL SULFATE (2.5 MG/3ML) 0.083% IN NEBU: 2.5000 mg | INHALATION_SOLUTION | RESPIRATORY_TRACT | Status: DC | PRN

## 2022-10-01 MED ORDER — ENOXAPARIN SODIUM 40 MG/0.4ML IJ SOSY
40.0000 mg | PREFILLED_SYRINGE | INTRAMUSCULAR | Status: DC
  Administered 2022-10-01 – 2022-10-02 (×2): 40 mg via SUBCUTANEOUS
  Filled 2022-10-01 (×2): qty 0.4

## 2022-10-01 MED ORDER — ACETAMINOPHEN 325 MG PO TABS: 650.0000 mg | ORAL_TABLET | Freq: Four times a day (QID) | ORAL | Status: DC | PRN

## 2022-10-01 MED ORDER — LACTATED RINGERS IV BOLUS
1000.0000 mL | Freq: Once | INTRAVENOUS | Status: AC
  Administered 2022-10-01: 1000 mL via INTRAVENOUS

## 2022-10-01 MED ORDER — ONDANSETRON HCL 4 MG PO TABS: 4.0000 mg | ORAL_TABLET | Freq: Four times a day (QID) | ORAL | Status: DC | PRN
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
## 2022-10-01 NOTE — ED Notes (Signed)
Pt made aware that urine specimen is needed. 

## 2022-10-01 NOTE — Hospital Course (Addendum)
Ms. Belfield is a 63 year old female with history of cadaveric transplant in 2021 on prednisone/Prograf/Myfortic, anemia of CKD, GERD, OSA, hyperparathyroidism of renal origin, DMII, HLD, HTN, COPD who tested positive for COVID last Wednesday at home. She presented with ongoing symptoms and generalized malaise with fever up to 103, productive cough, congestion, headache, nausea/vomiting/diarrhea, and right lower quadrant abdominal pain. She was also having some dyspnea with exertion. Family members also had COVID but had already recovered.  Therefore, she presented for further evaluation. COVID-19 testing was positive on admission.  CXR showed left lower lung opacities concerning for possible infection. She was also found to be hypoxic and started on oxygen. No leukocytosis on admission.  Procalcitonin initially elevated.  She was started on antibiotics and admitted for further monitoring.  She had good clinical improvement and was able to be weaned off of oxygen to room air soon after admission.  She was transitioned back to home prednisone at discharge and continued on course of antibiotics to complete total of 5 days.
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
## 2022-10-01 NOTE — ED Triage Notes (Signed)
Pt has history of kidney transplant. Pt took home covid test last Wednesday and it was positive. Over the weekend pt began feeling worse. Fever 103, NVD, cough, generalized body aches. Also endorses SOB and chest pain. Chest pain not better or worse with anything

## 2022-10-02 ENCOUNTER — Other Ambulatory Visit (INDEPENDENT_AMBULATORY_CARE_PROVIDER_SITE_OTHER): Payer: Self-pay | Admitting: Family Medicine

## 2022-10-02 DIAGNOSIS — N179 Acute kidney failure, unspecified: Secondary | ICD-10-CM | POA: Diagnosis not present

## 2022-10-02 DIAGNOSIS — E559 Vitamin D deficiency, unspecified: Secondary | ICD-10-CM

## 2022-10-02 DIAGNOSIS — U071 COVID-19: Secondary | ICD-10-CM | POA: Diagnosis not present

## 2022-10-02 DIAGNOSIS — Z940 Kidney transplant status: Secondary | ICD-10-CM

## 2022-10-02 DIAGNOSIS — E871 Hypo-osmolality and hyponatremia: Secondary | ICD-10-CM

## 2022-10-02 DIAGNOSIS — N1831 Chronic kidney disease, stage 3a: Secondary | ICD-10-CM | POA: Diagnosis not present

## 2022-10-02 LAB — BASIC METABOLIC PANEL
Anion gap: 10 (ref 5–15)
BUN: 21 mg/dL — ABNORMAL HIGH (ref 6–20)
CO2: 19 mmol/L — ABNORMAL LOW (ref 22–32)
Calcium: 8.1 mg/dL — ABNORMAL LOW (ref 8.9–10.3)
Chloride: 106 mmol/L (ref 98–111)
Creatinine, Ser: 1.19 mg/dL — ABNORMAL HIGH (ref 0.44–1.00)
GFR, Estimated: 52 mL/min — ABNORMAL LOW (ref >60)
Glucose, Bld: 149 mg/dL — ABNORMAL HIGH (ref 70–99)
Potassium: 4.1 mmol/L (ref 3.5–5.1)
Sodium: 135 mmol/L (ref 135–145)

## 2022-10-02 LAB — CBC
HCT: 37.2 % (ref 36.0–46.0)
Hemoglobin: 11.7 g/dL — ABNORMAL LOW (ref 12.0–15.0)
MCH: 29 pg (ref 26.0–34.0)
MCHC: 31.5 g/dL (ref 30.0–36.0)
MCV: 92.1 fL (ref 80.0–100.0)
Platelets: 191 10*3/uL (ref 150–400)
RBC: 4.04 MIL/uL (ref 3.87–5.11)
RDW: 14.5 % (ref 11.5–15.5)
WBC: 4 10*3/uL (ref 4.0–10.5)
nRBC: 0 % (ref 0.0–0.2)

## 2022-10-02 LAB — PROCALCITONIN: Procalcitonin: 0.44 ng/mL

## 2022-10-02 LAB — C-REACTIVE PROTEIN: CRP: 24.1 mg/dL — ABNORMAL HIGH (ref <1.0)

## 2022-10-02 LAB — HIV ANTIBODY (ROUTINE TESTING W REFLEX): HIV Screen 4th Generation wRfx: NONREACTIVE

## 2022-10-02 MED ORDER — CYCLOBENZAPRINE HCL 5 MG PO TABS
5.0000 mg | ORAL_TABLET | Freq: Every day | ORAL | Status: DC
  Administered 2022-10-02: 5 mg via ORAL
  Filled 2022-10-02: qty 1

## 2022-10-02 MED ORDER — MYCOPHENOLATE SODIUM 180 MG PO TBEC
540.0000 mg | DELAYED_RELEASE_TABLET | Freq: Two times a day (BID) | ORAL | Status: DC
  Administered 2022-10-02 – 2022-10-03 (×4): 540 mg via ORAL
  Filled 2022-10-02 (×4): qty 3

## 2022-10-02 MED ORDER — PREDNISONE 5 MG PO TABS: 5.0000 mg | ORAL_TABLET | Freq: Every day | ORAL | Status: DC

## 2022-10-02 MED ORDER — TACROLIMUS 1 MG PO CAPS
7.0000 mg | ORAL_CAPSULE | Freq: Two times a day (BID) | ORAL | Status: DC
  Administered 2022-10-02 – 2022-10-03 (×4): 7 mg via ORAL
  Filled 2022-10-02 (×4): qty 7

## 2022-10-02 MED ORDER — CYCLOBENZAPRINE HCL 10 MG PO TABS
10.0000 mg | ORAL_TABLET | Freq: Every day | ORAL | Status: DC
  Administered 2022-10-02: 10 mg via ORAL
  Filled 2022-10-02: qty 1

## 2022-10-02 MED ORDER — AMLODIPINE BESYLATE 10 MG PO TABS
10.0000 mg | ORAL_TABLET | Freq: Every day | ORAL | Status: DC
  Administered 2022-10-02: 10 mg via ORAL
  Filled 2022-10-02: qty 1

## 2022-10-02 MED ORDER — ATORVASTATIN CALCIUM 40 MG PO TABS
40.0000 mg | ORAL_TABLET | Freq: Every day | ORAL | Status: DC
  Administered 2022-10-02 – 2022-10-03 (×2): 40 mg via ORAL
  Filled 2022-10-02 (×2): qty 1

## 2022-10-02 MED ORDER — OXYCODONE HCL 5 MG PO TABS
5.0000 mg | ORAL_TABLET | Freq: Three times a day (TID) | ORAL | Status: DC | PRN
  Administered 2022-10-02 (×3): 5 mg via ORAL
  Filled 2022-10-02 (×3): qty 1

## 2022-10-02 MED ORDER — METHYLPREDNISOLONE SODIUM SUCC 40 MG IJ SOLR
40.0000 mg | INTRAMUSCULAR | Status: DC
  Administered 2022-10-02: 40 mg via INTRAVENOUS
  Filled 2022-10-02: qty 1

## 2022-10-02 MED ORDER — SERTRALINE HCL 25 MG PO TABS
25.0000 mg | ORAL_TABLET | Freq: Every day | ORAL | Status: DC
  Administered 2022-10-02 – 2022-10-03 (×2): 25 mg via ORAL
  Filled 2022-10-02 (×2): qty 1

## 2022-10-02 MED ORDER — OXYCODONE-ACETAMINOPHEN 5-325 MG PO TABS
1.0000 | ORAL_TABLET | Freq: Three times a day (TID) | ORAL | Status: DC | PRN
  Administered 2022-10-02 (×3): 1 via ORAL
  Filled 2022-10-02 (×3): qty 1

## 2022-10-02 NOTE — Assessment & Plan Note (Signed)
- 

## 2022-10-02 NOTE — Assessment & Plan Note (Signed)
-  Continue Lipitor °

## 2022-10-02 NOTE — Assessment & Plan Note (Addendum)
-   outside window for treatment; symptoms at home for approx 8 days prior to admission - steroids started for hypoxia -Rapid improvement after admission.  Transitioned back to home prednisone at discharge

## 2022-10-02 NOTE — Plan of Care (Signed)
Pt is progressing 

## 2022-10-02 NOTE — Progress Notes (Signed)
Progress Note    Kristen Moreno   ZOX:[id number]  DOB: [birth year]  DOA: 10/01/2022     1 PCP: Katheran James, DO  Initial CC: N/V/D, malaise, fever, cough  Hospital Course: Kristen Moreno is a 62 year old female with history of cadaveric transplant in 2021 on prednisone/Prograf/Myfortic, anemia of CKD, GERD, OSA, hyperparathyroidism of renal origin, DMII, HLD, HTN, COPD who tested positive for COVID last Wednesday at home. She presented with ongoing symptoms and generalized malaise with fever up to 103, productive cough, congestion, headache, nausea/vomiting/diarrhea, and right lower quadrant abdominal pain. She was also having some dyspnea with exertion. Family members also had COVID but had already recovered.  Therefore, she presented for further evaluation. COVID-19 testing was positive on admission.  CXR showed left lower lung opacities concerning for possible infection. She was also found to be hypoxic and started on oxygen. No leukocytosis on admission.  Procalcitonin initially elevated.  She was started on antibiotics and admitted for further monitoring.  Interval History:  Feeling a bit better when seen this morning.  Nausea symptoms had improved and she was amenable with advancing diet today.  Assessment and Plan: COVID-19 virus infection - outside window for treatment; symptoms at home for approx 8 days prior to admission - steroids started for hypoxia; home prednisone on hold  S/p cadaver renal transplant - Continue mycophenolate and tacrolimus - Home prednisone on hold while on Solu-Medrol -Renal transplant Doppler normal  Acute renal failure superimposed on stage 3a chronic kidney disease (HCC) - patient has history of CKD3a. Baseline creat ~ 1.1 - 1.2, eGFR~ 56 - patient presents with increase in creat >0.3 mg/dL above baseline or creat increase >1.5x baseline presumed to have occurred within past 7 days PTA - creat 1.49 on admission - improved with fluids;  advancing diet and will decrease fluid rate   Hyponatremia - Mild and asymptomatic - Has improved with fluids  Hyperlipidemia - Continue Lipitor  GERD (gastroesophageal reflux disease) - Continue Protonix  Hypertension - resume amlodipine    Old records reviewed in assessment of this patient  Antimicrobials: Rocephin 10/01/2022 >> current Azithromycin 10/01/2022 >> current  DVT prophylaxis:  enoxaparin (LOVENOX) injection 40 mg Start: 10/01/22 2200   Code Status:   Code Status: Full Code  Mobility Assessment (Last 72 Hours)     Mobility Assessment     Row Name 10/02/22 0800 10/01/22 2323 10/01/22 2322       Does patient have an order for bedrest or is patient medically unstable No - Continue assessment No - Continue assessment No - Continue assessment     What is the highest level of mobility based on the progressive mobility assessment? Level 6 (Walks independently in room and hall) - Balance while walking in room without assist - Complete Level 6 (Walks independently in room and hall) - Balance while walking in room without assist - Complete Level 6 (Walks independently in room and hall) - Balance while walking in room without assist - Complete              Barriers to discharge: None Disposition Plan: Home Status is: Inpatient  Objective: Blood pressure 131/68, pulse 79, temperature 97.9 F (36.6 C), resp. rate 18, height 5\' 6"  (1.676 m), weight 72.6 kg, SpO2 100%.  Examination:  Physical Exam Constitutional:      Appearance: Normal appearance.  HENT:     Head: Normocephalic and atraumatic.     Mouth/Throat:     Mouth: Mucous membranes are moist.  Eyes:  Extraocular Movements: Extraocular movements intact.  Cardiovascular:     Rate and Rhythm: Normal rate and regular rhythm.  Pulmonary:     Effort: Pulmonary effort is normal. No respiratory distress.     Breath sounds: Normal breath sounds. No wheezing.  Abdominal:     General: Bowel sounds are  normal. There is no distension.     Palpations: Abdomen is soft.     Tenderness: There is no abdominal tenderness.  Musculoskeletal:        General: Normal range of motion.     Cervical back: Normal range of motion and neck supple.  Skin:    General: Skin is warm and dry.  Neurological:     General: No focal deficit present.     Mental Status: She is alert.  Psychiatric:        Mood and Affect: Mood normal.      Consultants:    Procedures:    Data Reviewed: Results for orders placed or performed during the hospital encounter of 10/01/22 (from the past 24 hour(s))  Urinalysis, w/ Reflex to Culture (Infection Suspected) -Urine, Clean Catch     Status: Abnormal   Collection Time: 10/01/22  3:35 PM  Result Value Ref Range   Specimen Source URINE, CATHETERIZED    Color, Urine YELLOW YELLOW   APPearance HAZY (A) CLEAR   Specific Gravity, Urine 1.020 1.005 - 1.030   pH 5.0 5.0 - 8.0   Glucose, UA NEGATIVE NEGATIVE mg/dL   Hgb urine dipstick NEGATIVE NEGATIVE   Bilirubin Urine NEGATIVE NEGATIVE   Ketones, ur 20 (A) NEGATIVE mg/dL   Protein, ur 30 (A) NEGATIVE mg/dL   Nitrite NEGATIVE NEGATIVE   Leukocytes,Ua NEGATIVE NEGATIVE   RBC / HPF 0-5 0 - 5 RBC/hpf   WBC, UA 0-5 0 - 5 WBC/hpf   Bacteria, UA RARE (A) NONE SEEN   Squamous Epithelial / HPF 6-10 0 - 5 /HPF   Mucus PRESENT   Culture, blood (Routine x 2)     Status: None (Preliminary result)   Collection Time: 10/01/22  3:40 PM   Specimen: BLOOD RIGHT HAND  Result Value Ref Range   Specimen Description      BLOOD RIGHT HAND Performed at [facility], [address], [place], [place] [address]    Special Requests      BOTTLES DRAWN AEROBIC AND ANAEROBIC Blood Culture adequate volume Performed at [facility], [address], [place], [place] [address]    Culture      NO GROWTH < 12 HOURS Performed at [facility], [address], [place], [place] [address]    Report Status PENDING    Comprehensive metabolic panel     Status: Abnormal   Collection Time: 10/01/22  3:45 PM  Result Value Ref Range   Sodium 132 (L) 135 - 145 mmol/L   Potassium 3.6 3.5 - 5.1 mmol/L   Chloride 101 98 - 111 mmol/L   CO2 18 (L) 22 - 32 mmol/L   Glucose, Bld 102 (H) 70 - 99 mg/dL   BUN 21 (H) 6 - 20 mg/dL   Creatinine, Ser 9.14 (H) 0.44 - 1.00 mg/dL   Calcium 8.6 (L) 8.9 - 10.3 mg/dL   Total Protein 7.6 6.5 - 8.1 g/dL   Albumin 3.3 (L) 3.5 - 5.0 g/dL   AST 17 15 - 41 U/L   ALT 17 0 - 44 U/L   Alkaline Phosphatase 57 38 - 126 U/L  Total Bilirubin 0.5 0.3 - 1.2 mg/dL   GFR, Estimated 40 (L) >60 mL/min   Anion gap 13 5 - 15  CBC with Differential     Status: Abnormal   Collection Time: 10/01/22  3:45 PM  Result Value Ref Range   WBC 5.0 4.0 - 10.5 K/uL   RBC 4.24 3.87 - 5.11 MIL/uL   Hemoglobin 12.3 12.0 - 15.0 g/dL   HCT 13.2 44.0 - 10.2 %   MCV 92.2 80.0 - 100.0 fL   MCH 29.0 26.0 - 34.0 pg   MCHC 31.5 30.0 - 36.0 g/dL   RDW 72.5 36.6 - 44.0 %   Platelets 188 150 - 400 K/uL   nRBC 0.0 0.0 - 0.2 %   Neutrophils Relative % 85 %   Neutro Abs 4.2 1.7 - 7.7 K/uL   Lymphocytes Relative 8 %   Lymphs Abs 0.4 (L) 0.7 - 4.0 K/uL   Monocytes Relative 4 %   Monocytes Absolute 0.2 0.1 - 1.0 K/uL   Eosinophils Relative 3 %   Eosinophils Absolute 0.1 0.0 - 0.5 K/uL   Basophils Relative 0 %   Basophils Absolute 0.0 0.0 - 0.1 K/uL   Immature Granulocytes 0 %   Abs Immature Granulocytes 0.02 0.00 - 0.07 K/uL  Protime-INR     Status: None   Collection Time: 10/01/22  3:45 PM  Result Value Ref Range   Prothrombin Time 14.3 11.4 - 15.2 seconds   INR 1.1 0.8 - 1.2  Culture, blood (Routine x 2)     Status: None (Preliminary result)   Collection Time: 10/01/22  3:45 PM   Specimen: BLOOD  Result Value Ref Range   Specimen Description      BLOOD RIGHT ANTECUBITAL Performed at [facility], [address], [place], [place] [address]    Special Requests      BOTTLES DRAWN  AEROBIC AND ANAEROBIC Blood Culture results may not be optimal due to an inadequate volume of blood received in culture bottles Performed at [facility], [address] [place], [place] [address]    Culture      NO GROWTH < 12 HOURS Performed at [facility], [address], [place], [place] [address]    Report Status PENDING   I-Stat Lactic Acid, ED     Status: None   Collection Time: 10/01/22  3:56 PM  Result Value Ref Range   Lactic Acid, Venous 1.3 0.5 - 1.9 mmol/L  Resp panel by RT-PCR (RSV, Flu A&B, Covid) Anterior Nasal Swab     Status: Abnormal   Collection Time: 10/01/22  4:03 PM   Specimen: Anterior Nasal Swab  Result Value Ref Range   SARS Coronavirus 2 by RT PCR POSITIVE (A) NEGATIVE   Influenza A by PCR NEGATIVE NEGATIVE   Influenza B by PCR NEGATIVE NEGATIVE   Resp Syncytial Virus by PCR NEGATIVE NEGATIVE  Procalcitonin     Status: None   Collection Time: 10/01/22  9:35 PM  Result Value Ref Range   Procalcitonin 0.72 ng/mL  HIV Antibody (routine testing w rflx)     Status: None   Collection Time: 10/01/22  9:35 PM  Result Value Ref Range   HIV Screen 4th Generation wRfx Non Reactive Non Reactive  C-reactive protein     Status: Abnormal   Collection Time: 10/01/22 10:17 PM  Result Value Ref Range   CRP 24.1 (H) <1.0 mg/dL  Procalcitonin     Status: None  Collection Time: 10/02/22  5:28 AM  Result Value Ref Range   Procalcitonin 0.44 ng/mL  Basic metabolic panel     Status: Abnormal   Collection Time: 10/02/22  5:28 AM  Result Value Ref Range   Sodium 135 135 - 145 mmol/L   Potassium 4.1 3.5 - 5.1 mmol/L   Chloride 106 98 - 111 mmol/L   CO2 19 (L) 22 - 32 mmol/L   Glucose, Bld 149 (H) 70 - 99 mg/dL   BUN 21 (H) 6 - 20 mg/dL   Creatinine, Ser 1.61 (H) 0.44 - 1.00 mg/dL   Calcium 8.1 (L) 8.9 - 10.3 mg/dL   GFR, Estimated 52 (L) >60 mL/min   Anion gap 10 5 - 15  CBC     Status: Abnormal   Collection Time: 10/02/22  5:28 AM   Result Value Ref Range   WBC 4.0 4.0 - 10.5 K/uL   RBC 4.04 3.87 - 5.11 MIL/uL   Hemoglobin 11.7 (L) 12.0 - 15.0 g/dL   HCT 09.6 04.5 - 40.9 %   MCV 92.1 80.0 - 100.0 fL   MCH 29.0 26.0 - 34.0 pg   MCHC 31.5 30.0 - 36.0 g/dL   RDW 81.1 91.4 - 78.2 %   Platelets 191 150 - 400 K/uL   nRBC 0.0 0.0 - 0.2 %    I have reviewed pertinent nursing notes, vitals, labs, and images as necessary. I have ordered labwork to follow up on as indicated.  I have reviewed the last notes from staff over past 24 hours. I have discussed patient's care plan and test results with nursing staff, CM/SW, and other staff as appropriate.  Time spent: Greater than 50% of the 55 minute visit was spent in counseling/coordination of care for the patient as laid out in the A&P.   LOS: 1 day   Lewie Chamber, MD [facility] 10/02/2022, 3:15 PM

## 2022-10-02 NOTE — Plan of Care (Signed)
  Problem: Education: Goal: Knowledge of risk factors and measures for prevention of condition will improve Outcome: Progressing   Problem: Education: Goal: Knowledge of General Education information will improve Description: Including pain rating scale, medication(s)/side effects and non-pharmacologic comfort measures Outcome: Progressing   Problem: Clinical Measurements: Goal: Ability to maintain clinical measurements within normal limits will improve Outcome: Progressing   Problem: Pain Managment: Goal: General experience of comfort will improve Outcome: Progressing   Problem: Safety: Goal: Ability to remain free from injury will improve Outcome: Progressing   Problem: Skin Integrity: Goal: Risk for impaired skin integrity will decrease Outcome: Progressing

## 2022-10-02 NOTE — Assessment & Plan Note (Addendum)
-   Continue mycophenolate and tacrolimus - Home prednisone on hold while on Solu-Medrol; prednisone resumed at discharge -Renal transplant Doppler normal

## 2022-10-02 NOTE — Assessment & Plan Note (Addendum)
-   Mild and asymptomatic - Has improved with fluids -Sodium 137 at discharge

## 2022-10-02 NOTE — Assessment & Plan Note (Addendum)
-   patient has history of CKD3a. Baseline creat ~ 1.1 - 1.2, eGFR~ 56 - patient presents with increase in creat >0.3 mg/dL above baseline or creat increase >1.5x baseline presumed to have occurred within past 7 days PTA - creat 1.49 on admission -Normalized after fluids and diet advancement - Creatinine 0.97 at discharge

## 2022-10-03 ENCOUNTER — Encounter: Payer: Self-pay | Admitting: Internal Medicine

## 2022-10-03 DIAGNOSIS — U071 COVID-19: Secondary | ICD-10-CM | POA: Diagnosis not present

## 2022-10-03 DIAGNOSIS — N179 Acute kidney failure, unspecified: Secondary | ICD-10-CM | POA: Diagnosis not present

## 2022-10-03 DIAGNOSIS — N1831 Chronic kidney disease, stage 3a: Secondary | ICD-10-CM | POA: Diagnosis not present

## 2022-10-03 DIAGNOSIS — Z940 Kidney transplant status: Secondary | ICD-10-CM | POA: Diagnosis not present

## 2022-10-03 LAB — CBC WITH DIFFERENTIAL/PLATELET
Abs Immature Granulocytes: 0.06 10*3/uL (ref 0.00–0.07)
Basophils Absolute: 0 10*3/uL (ref 0.0–0.1)
Basophils Relative: 0 %
Eosinophils Absolute: 0 10*3/uL (ref 0.0–0.5)
Eosinophils Relative: 0 %
HCT: 36.1 % (ref 36.0–46.0)
Hemoglobin: 11.3 g/dL — ABNORMAL LOW (ref 12.0–15.0)
Immature Granulocytes: 1 %
Lymphocytes Relative: 6 %
Lymphs Abs: 0.4 10*3/uL — ABNORMAL LOW (ref 0.7–4.0)
MCH: 29.8 pg (ref 26.0–34.0)
MCHC: 31.3 g/dL (ref 30.0–36.0)
MCV: 95.3 fL (ref 80.0–100.0)
Monocytes Absolute: 0.2 10*3/uL (ref 0.1–1.0)
Monocytes Relative: 3 %
Neutro Abs: 6.5 10*3/uL (ref 1.7–7.7)
Neutrophils Relative %: 90 %
Platelets: 246 10*3/uL (ref 150–400)
RBC: 3.79 MIL/uL — ABNORMAL LOW (ref 3.87–5.11)
RDW: 14.8 % (ref 11.5–15.5)
WBC: 7.2 10*3/uL (ref 4.0–10.5)
nRBC: 0 % (ref 0.0–0.2)

## 2022-10-03 LAB — BASIC METABOLIC PANEL
Anion gap: 8 (ref 5–15)
BUN: 20 mg/dL (ref 6–20)
CO2: 16 mmol/L — ABNORMAL LOW (ref 22–32)
Calcium: 8.4 mg/dL — ABNORMAL LOW (ref 8.9–10.3)
Chloride: 113 mmol/L — ABNORMAL HIGH (ref 98–111)
Creatinine, Ser: 0.97 mg/dL (ref 0.44–1.00)
GFR, Estimated: >60 mL/min (ref >60)
Glucose, Bld: 214 mg/dL — ABNORMAL HIGH (ref 70–99)
Potassium: 4.4 mmol/L (ref 3.5–5.1)
Sodium: 137 mmol/L (ref 135–145)

## 2022-10-03 LAB — C-REACTIVE PROTEIN: CRP: 15 mg/dL — ABNORMAL HIGH (ref <1.0)

## 2022-10-03 LAB — MAGNESIUM: Magnesium: 1.9 mg/dL (ref 1.7–2.4)

## 2022-10-03 MED ORDER — DOXYCYCLINE HYCLATE 100 MG PO TABS
100.0000 mg | ORAL_TABLET | Freq: Two times a day (BID) | ORAL | Status: DC
  Administered 2022-10-03: 100 mg via ORAL
  Filled 2022-10-03: qty 1

## 2022-10-03 MED ORDER — DOXYCYCLINE HYCLATE 100 MG PO TABS: 100.0000 mg | ORAL_TABLET | Freq: Two times a day (BID) | ORAL | 0 refills | Status: AC

## 2022-10-03 MED ORDER — AMOXICILLIN 500 MG PO CAPS
500.0000 mg | ORAL_CAPSULE | Freq: Three times a day (TID) | ORAL | Status: DC
  Administered 2022-10-03: 500 mg via ORAL
  Filled 2022-10-03 (×2): qty 1

## 2022-10-03 MED ORDER — SODIUM BICARBONATE 650 MG PO TABS
650.0000 mg | ORAL_TABLET | Freq: Three times a day (TID) | ORAL | Status: DC
  Administered 2022-10-03: 650 mg via ORAL
  Filled 2022-10-03: qty 1

## 2022-10-03 MED ORDER — AMOXICILLIN 875 MG PO TABS: 875.0000 mg | ORAL_TABLET | Freq: Two times a day (BID) | ORAL | 0 refills | Status: AC

## 2022-10-03 MED ORDER — AZITHROMYCIN 250 MG PO TABS: 500.0000 mg | ORAL_TABLET | Freq: Every day | ORAL | Status: DC

## 2022-10-03 MED ORDER — PANTOPRAZOLE SODIUM 40 MG PO TBEC
40.0000 mg | DELAYED_RELEASE_TABLET | Freq: Every day | ORAL | Status: DC
  Administered 2022-10-03: 40 mg via ORAL
  Filled 2022-10-03: qty 1

## 2022-10-03 NOTE — Progress Notes (Signed)
SATURATION QUALIFICATIONS: (This note is used to comply with regulatory documentation for home oxygen)  Patient Saturations on Room Air at Rest = 99%  Patient Saturations on Room Air while Ambulating = 96%

## 2022-10-03 NOTE — Discharge Summary (Signed)
Physician Discharge Summary   Kristen Moreno XBJ:[id number] DOB: Dec 14, 1960 DOA: 10/01/2022  PCP: Katheran James, DO  Admit date: 10/01/2022 Discharge date: 10/03/2022   Admitted From: Home Disposition:  Home Discharging physician: Lewie Chamber, MD Barriers to discharge: none  Recommendations at discharge: Continue chronic medical management  Discharge Condition: stable CODE STATUS: Full Diet recommendation:  Diet Orders (From admission, onward)     Start     Ordered   10/03/22 0000  Diet general        10/03/22 1016   10/02/22 1431  Diet regular Fluid consistency: Thin  Diet effective now       Question:  Fluid consistency:  Answer:  Thin   10/02/22 1430            Hospital Course: Ms. [name] is a 62 year old female with history of cadaveric transplant in 2021 on prednisone/Prograf/Myfortic, anemia of CKD, GERD, OSA, hyperparathyroidism of renal origin, DMII, HLD, HTN, COPD who tested positive for COVID last Wednesday at home. She presented with ongoing symptoms and generalized malaise with fever up to 103, productive cough, congestion, headache, nausea/vomiting/diarrhea, and right lower quadrant abdominal pain. She was also having some dyspnea with exertion. Family members also had COVID but had already recovered.  Therefore, she presented for further evaluation. COVID-19 testing was positive on admission.  CXR showed left lower lung opacities concerning for possible infection. She was also found to be hypoxic and started on oxygen. No leukocytosis on admission.  Procalcitonin initially elevated.  She was started on antibiotics and admitted for further monitoring.  She had good clinical improvement and was able to be weaned off of oxygen to room air soon after admission.  She was transitioned back to home prednisone at discharge and continued on course of antibiotics to complete total of 5 days.  Assessment and Plan: * COVID-19 virus infection - outside window  for treatment; symptoms at home for approx 8 days prior to admission - steroids started for hypoxia -Rapid improvement after admission.  Transitioned back to home prednisone at discharge  S/p cadaver renal transplant - Continue mycophenolate and tacrolimus - Home prednisone on hold while on Solu-Medrol; prednisone resumed at discharge -Renal transplant Doppler normal  Acute renal failure superimposed on stage 3a chronic kidney disease (HCC) - patient has history of CKD3a. Baseline creat ~ 1.1 - 1.2, eGFR~ 56 - patient presents with increase in creat >0.3 mg/dL above baseline or creat increase >1.5x baseline presumed to have occurred within past 7 days PTA - creat 1.49 on admission -Normalized after fluids and diet advancement - Creatinine 0.97 at discharge   Hyperlipidemia - Continue Lipitor  GERD (gastroesophageal reflux disease) - Continue Protonix  Hypertension - resume amlodipine   Hyponatremia-resolved as of 10/03/2022 - Mild and asymptomatic - Has improved with fluids -Sodium 137 at discharge   The patient's acute and chronic medical conditions were treated accordingly. On day of discharge, patient was felt deemed stable for discharge. Patient/family member advised to call PCP or come back to ER if needed.   Principal Diagnosis: COVID-19 virus infection  Discharge Diagnoses: Active Hospital Problems   Diagnosis Date Noted   COVID-19 virus infection 10/01/2022    Priority: 1.   S/p cadaver renal transplant 01/10/2021    Priority: 2.   Acute renal failure superimposed on stage 3a chronic kidney disease (HCC) 10/02/2022    Priority: 3.   Hyperlipidemia 03/08/2016   GERD (gastroesophageal reflux disease) 05/02/2014   Hypertension 12/16/2011    Resolved Hospital Problems  Diagnosis Date Noted Date Resolved   Hyponatremia 10/02/2022 10/03/2022     Discharge Instructions     Diet general   Complete by: As directed    Increase activity slowly   Complete by: As  directed       Allergies as of 10/03/2022       Reactions   Ace Inhibitors Cough   Reaction to lisinopril   Lisinopril Cough        Medication List     TAKE these medications    acetaminophen 500 MG tablet Commonly known as: TYLENOL Take 1,500-2,000 mg by mouth daily as needed (pain).   albuterol 108 (90 Base) MCG/ACT inhaler Commonly known as: Proventil HFA Inhale 1-2 puffs into the lungs every 6 (six) hours as needed for wheezing or shortness of breath.   amLODipine 10 MG tablet Commonly known as: NORVASC Take 10 mg by mouth at bedtime.   amoxicillin 875 MG tablet Commonly known as: AMOXIL Take 1 tablet (875 mg total) by mouth 2 (two) times daily for 3 days.   atorvastatin 40 MG tablet Commonly known as: LIPITOR Take 1 tablet (40 mg total) by mouth daily.   BD Pen Needle Nano 2nd Gen 32G X 4 MM Misc Generic drug: Insulin Pen Needle 1 Package by Does not apply route 2 (two) times daily.   cyclobenzaprine 10 MG tablet Commonly known as: FLEXERIL Take 10 mg by mouth at bedtime.   doxycycline 100 MG tablet Commonly known as: VIBRA-TABS Take 1 tablet (100 mg total) by mouth 2 (two) times daily for 3 days.   HAIR SKIN & NAILS GUMMIES PO Take 1 tablet by mouth daily.   Magnesium 250 MG Tabs Take 250 mg by mouth 2 (two) times daily.   mycophenolate 180 MG EC tablet Commonly known as: MYFORTIC Take 3 tablets (540 mg total) by mouth 2 (two) times daily.   naloxone 4 MG/0.1ML Liqd nasal spray kit Commonly known as: NARCAN Place 1 spray into the nose once.   omeprazole 20 MG capsule Commonly known as: PRILOSEC Take 1 capsule by mouth once daily   ondansetron 4 MG tablet Commonly known as: Zofran Take 1 tablet (4 mg total) by mouth every 8 (eight) hours as needed for nausea or vomiting.   oxyCODONE-acetaminophen 10-325 MG tablet Commonly known as: PERCOCET Take 1 tablet by mouth every 8 (eight) hours as needed.   Ozempic (0.25 or 0.5 MG/DOSE) 2 MG/3ML  Sopn Generic drug: Semaglutide(0.25 or 0.5MG /DOS) Inject 0.25 mg into the skin once a week.   predniSONE 5 MG tablet Commonly known as: DELTASONE Take 5 mg by mouth daily.   promethazine 25 MG tablet Commonly known as: PHENERGAN Take 25 mg by mouth every 6 (six) hours as needed for nausea or vomiting.   sertraline 25 MG tablet Commonly known as: Zoloft Take 1 tablet (25 mg total) by mouth daily.   sodium bicarbonate 650 MG tablet Take 650 mg by mouth 3 (three) times daily.   tacrolimus 1 MG capsule Commonly known as: PROGRAF Take 7 mg by mouth 2 (two) times daily.   Tiotropium Bromide Monohydrate 2.5 MCG/ACT Aers Inhale 2 puffs into the lungs daily.   Vitamin D (Ergocalciferol) 1.25 MG (50000 UNIT) Caps capsule Commonly known as: DRISDOL Take 1 capsule (50,000 Units total) by mouth every 7 (seven) days.        Allergies  Allergen Reactions   Ace Inhibitors Cough    Reaction to lisinopril   Lisinopril Cough  Consultations:   Procedures:   Discharge Exam: BP (!) 147/75 (BP Location: Right Arm)   Pulse 77   Temp 98 F (36.7 C) (Oral)   Resp 18   Ht 5\' 6"  (1.676 m)   Wt 72.6 kg   SpO2 100%   BMI 25.82 kg/m  Physical Exam Constitutional:      Appearance: Normal appearance.  HENT:     Head: Normocephalic and atraumatic.     Mouth/Throat:     Mouth: Mucous membranes are moist.  Eyes:     Extraocular Movements: Extraocular movements intact.  Cardiovascular:     Rate and Rhythm: Normal rate and regular rhythm.  Pulmonary:     Effort: Pulmonary effort is normal. No respiratory distress.     Breath sounds: Normal breath sounds. No wheezing.  Abdominal:     General: Bowel sounds are normal. There is no distension.     Palpations: Abdomen is soft.     Tenderness: There is no abdominal tenderness.  Musculoskeletal:        General: Normal range of motion.     Cervical back: Normal range of motion and neck supple.  Skin:    General: Skin is warm and  dry.  Neurological:     General: No focal deficit present.     Mental Status: She is alert.  Psychiatric:        Mood and Affect: Mood normal.      The results of significant diagnostics from this hospitalization (including imaging, microbiology, ancillary and laboratory) are listed below for reference.   Microbiology: Recent Results (from the past 240 hour(s))  Culture, blood (Routine x 2)     Status: None (Preliminary result)   Collection Time: 10/01/22  3:40 PM   Specimen: BLOOD RIGHT HAND  Result Value Ref Range Status   Specimen Description   Final    BLOOD RIGHT HAND Performed at [facility], [address], [place], [place] [address]    Special Requests   Final    BOTTLES DRAWN AEROBIC AND ANAEROBIC Blood Culture adequate volume Performed at [facility], [address] [place], [place] [address]    Culture   Final    NO GROWTH 2 DAYS Performed at [facility], [address] [place], [place] [address]    Report Status PENDING  Incomplete  Culture, blood (Routine x 2)     Status: None (Preliminary result)   Collection Time: 10/01/22  3:45 PM   Specimen: BLOOD  Result Value Ref Range Status   Specimen Description   Final    BLOOD RIGHT ANTECUBITAL Performed at [facility], [address], [place], [place] [address]    Special Requests   Final    BOTTLES DRAWN AEROBIC AND ANAEROBIC Blood Culture results may not be optimal due to an inadequate volume of blood received in culture bottles Performed at [facility], [address], [place], [place] [address]    Culture   Final    NO GROWTH 2 DAYS Performed at [facility], [address] [place], [place] [address]    Report Status PENDING  Incomplete  Resp panel by RT-PCR (RSV, Flu A&B, Covid) Anterior Nasal Swab     Status: Abnormal   Collection Time: 10/01/22  4:03 PM   Specimen: Anterior Nasal Swab  Result Value Ref Range Status    SARS Coronavirus 2 by RT PCR POSITIVE (A) NEGATIVE Final  Comment: (NOTE) SARS-CoV-2 target nucleic acids are DETECTED.  The SARS-CoV-2 RNA is generally detectable in upper respiratory specimens during the acute phase of infection. Positive results are indicative of the presence of the identified virus, but do not rule out bacterial infection or co-infection with other pathogens not detected by the test. Clinical correlation with patient history and other diagnostic information is necessary to determine patient infection status. The expected result is Negative.  Fact Sheet for Patients: BloggerCourse.com  Fact Sheet for Healthcare Providers: SeriousBroker.it  This test is not yet approved or cleared by the [place] FDA and  has been authorized for detection and/or diagnosis of SARS-CoV-2 by FDA under an Emergency Use Authorization (EUA).  This EUA will remain in effect (meaning this test can be used) for the duration of  the COVID-19 declaration under Section 564(b)(1) of the A ct, 21 U.S.C. section 360bbb-3(b)(1), unless the authorization is terminated or revoked sooner.     Influenza A by PCR NEGATIVE NEGATIVE Final   Influenza B by PCR NEGATIVE NEGATIVE Final    Comment: (NOTE) The Xpert Xpress SARS-CoV-2/FLU/RSV plus assay is intended as an aid in the diagnosis of influenza from Nasopharyngeal swab specimens and should not be used as a sole basis for treatment. Nasal washings and aspirates are unacceptable for Xpert Xpress SARS-CoV-2/FLU/RSV testing.  Fact Sheet for Patients: BloggerCourse.com  Fact Sheet for Healthcare Providers: SeriousBroker.it  This test is not yet approved or cleared by the [place] FDA and has been authorized for detection and/or diagnosis of SARS-CoV-2 by FDA under an Emergency Use Authorization (EUA). This EUA will remain in effect  (meaning this test can be used) for the duration of the COVID-19 declaration under Section 564(b)(1) of the Act, 21 U.S.C. section 360bbb-3(b)(1), unless the authorization is terminated or revoked.     Resp Syncytial Virus by PCR NEGATIVE NEGATIVE Final    Comment: (NOTE) Fact Sheet for Patients: BloggerCourse.com  Fact Sheet for Healthcare Providers: SeriousBroker.it  This test is not yet approved or cleared by the [place] FDA and has been authorized for detection and/or diagnosis of SARS-CoV-2 by FDA under an Emergency Use Authorization (EUA). This EUA will remain in effect (meaning this test can be used) for the duration of the COVID-19 declaration under Section 564(b)(1) of the Act, 21 U.S.C. section 360bbb-3(b)(1), unless the authorization is terminated or revoked.  Performed at [facility], [address] [place], [place] [address]      Labs: BNP (last 3 results) No results for input(s): "BNP" in the last 8760 hours. Basic Metabolic Panel: Recent Labs  Lab 10/01/22 1545 10/02/22 0528 10/03/22 0538  NA 132* 135 137  K 3.6 4.1 4.4  CL 101 106 113*  CO2 18* 19* 16*  GLUCOSE 102* 149* 214*  BUN 21* 21* 20  CREATININE 1.49* 1.19* 0.97  CALCIUM 8.6* 8.1* 8.4*  MG  --   --  1.9   Liver Function Tests: Recent Labs  Lab 10/01/22 1545  AST 17  ALT 17  ALKPHOS 57  BILITOT 0.5  PROT 7.6  ALBUMIN 3.3*   No results for input(s): "LIPASE", "AMYLASE" in the last 168 hours. No results for input(s): "AMMONIA" in the last 168 hours. CBC: Recent Labs  Lab 10/01/22 1545 10/02/22 0528 10/03/22 0538  WBC 5.0 4.0 7.2  NEUTROABS 4.2  --  6.5  HGB 12.3 11.7* 11.3*  HCT 39.1 37.2 36.1  MCV 92.2 92.1 95.3  PLT 188 191 246   Cardiac  Enzymes: No results for input(s): "CKTOTAL", "CKMB", "CKMBINDEX", "TROPONINI" in the last 168 hours. BNP: Invalid input(s): "POCBNP" CBG: No results for  input(s): "GLUCAP" in the last 168 hours. D-Dimer No results for input(s): "DDIMER" in the last 72 hours. Hgb A1c No results for input(s): "HGBA1C" in the last 72 hours. Lipid Profile No results for input(s): "CHOL", "HDL", "LDLCALC", "TRIG", "CHOLHDL", "LDLDIRECT" in the last 72 hours. Thyroid function studies No results for input(s): "TSH", "T4TOTAL", "T3FREE", "THYROIDAB" in the last 72 hours.  Invalid input(s): "FREET3" Anemia work up No results for input(s): "VITAMINB12", "FOLATE", "FERRITIN", "TIBC", "IRON", "RETICCTPCT" in the last 72 hours. Urinalysis    Component Value Date/Time   COLORURINE YELLOW 10/01/2022 1535   APPEARANCEUR HAZY (A) 10/01/2022 1535   APPEARANCEUR Turbid (A) 07/10/2022 1412   LABSPEC 1.020 10/01/2022 1535   PHURINE 5.0 10/01/2022 1535   GLUCOSEU NEGATIVE 10/01/2022 1535   HGBUR NEGATIVE 10/01/2022 1535   BILIRUBINUR NEGATIVE 10/01/2022 1535   BILIRUBINUR Negative 07/10/2022 1412   KETONESUR 20 (A) 10/01/2022 1535   PROTEINUR 30 (A) 10/01/2022 1535   UROBILINOGEN 0.2 06/13/2022 1739   UROBILINOGEN 0.2 11/07/2013 1337   NITRITE NEGATIVE 10/01/2022 1535   LEUKOCYTESUR NEGATIVE 10/01/2022 1535   Sepsis Labs Recent Labs  Lab 10/01/22 1545 10/02/22 0528 10/03/22 0538  WBC 5.0 4.0 7.2   Microbiology Recent Results (from the past 240 hour(s))  Culture, blood (Routine x 2)     Status: None (Preliminary result)   Collection Time: 10/01/22  3:40 PM   Specimen: BLOOD RIGHT HAND  Result Value Ref Range Status   Specimen Description   Final    BLOOD RIGHT HAND Performed at [facility], [address] [place], [place] [address]    Special Requests   Final    BOTTLES DRAWN AEROBIC AND ANAEROBIC Blood Culture adequate volume Performed at [facility], [address] [place], [place] [address]    Culture   Final    NO GROWTH 2 DAYS Performed at [facility], [address] [place], [place] [address]    Report  Status PENDING  Incomplete  Culture, blood (Routine x 2)     Status: None (Preliminary result)   Collection Time: 10/01/22  3:45 PM   Specimen: BLOOD  Result Value Ref Range Status   Specimen Description   Final    BLOOD RIGHT ANTECUBITAL Performed at [facility], [address], [place], [place] [address]    Special Requests   Final    BOTTLES DRAWN AEROBIC AND ANAEROBIC Blood Culture results may not be optimal due to an inadequate volume of blood received in culture bottles Performed at [facility], [address], [place], [place] [address]    Culture   Final    NO GROWTH 2 DAYS Performed at [facility], [address] [place], [place] [address]    Report Status PENDING  Incomplete  Resp panel by RT-PCR (RSV, Flu A&B, Covid) Anterior Nasal Swab     Status: Abnormal   Collection Time: 10/01/22  4:03 PM   Specimen: Anterior Nasal Swab  Result Value Ref Range Status   SARS Coronavirus 2 by RT PCR POSITIVE (A) NEGATIVE Final    Comment: (NOTE) SARS-CoV-2 target nucleic acids are DETECTED.  The SARS-CoV-2 RNA is generally detectable in upper respiratory specimens during the acute phase of infection. Positive results are indicative of the presence of the identified virus, but do not rule  out bacterial infection or co-infection with other pathogens not detected by the test. Clinical correlation with patient history and other diagnostic information is necessary to determine patient infection status. The expected result is Negative.  Fact Sheet for Patients: BloggerCourse.com  Fact Sheet for Healthcare Providers: SeriousBroker.it  This test is not yet approved or cleared by the [place] FDA and  has been authorized for detection and/or diagnosis of SARS-CoV-2 by FDA under an Emergency Use Authorization (EUA).  This EUA will remain in effect (meaning this test can be used) for the  duration of  the COVID-19 declaration under Section 564(b)(1) of the A ct, 21 U.S.C. section 360bbb-3(b)(1), unless the authorization is terminated or revoked sooner.     Influenza A by PCR NEGATIVE NEGATIVE Final   Influenza B by PCR NEGATIVE NEGATIVE Final    Comment: (NOTE) The Xpert Xpress SARS-CoV-2/FLU/RSV plus assay is intended as an aid in the diagnosis of influenza from Nasopharyngeal swab specimens and should not be used as a sole basis for treatment. Nasal washings and aspirates are unacceptable for Xpert Xpress SARS-CoV-2/FLU/RSV testing.  Fact Sheet for Patients: BloggerCourse.com  Fact Sheet for Healthcare Providers: SeriousBroker.it  This test is not yet approved or cleared by the [place] FDA and has been authorized for detection and/or diagnosis of SARS-CoV-2 by FDA under an Emergency Use Authorization (EUA). This EUA will remain in effect (meaning this test can be used) for the duration of the COVID-19 declaration under Section 564(b)(1) of the Act, 21 U.S.C. section 360bbb-3(b)(1), unless the authorization is terminated or revoked.     Resp Syncytial Virus by PCR NEGATIVE NEGATIVE Final    Comment: (NOTE) Fact Sheet for Patients: BloggerCourse.com  Fact Sheet for Healthcare Providers: SeriousBroker.it  This test is not yet approved or cleared by the [place] FDA and has been authorized for detection and/or diagnosis of SARS-CoV-2 by FDA under an Emergency Use Authorization (EUA). This EUA will remain in effect (meaning this test can be used) for the duration of the COVID-19 declaration under Section 564(b)(1) of the Act, 21 U.S.C. section 360bbb-3(b)(1), unless the authorization is terminated or revoked.  Performed at [facility], [address], [place], [place] [address]     Procedures/Studies: US Renal Transplant  w/Doppler  Result Date: 10/01/2022 CLINICAL DATA:  Acute renal insufficiency, status post renal transplantation EXAM: ULTRASOUND OF RENAL TRANSPLANT WITH RENAL DOPPLER ULTRASOUND TECHNIQUE: Ultrasound examination of the renal transplant was performed with gray-scale, color and duplex doppler evaluation. COMPARISON:  CT 11/10/2021 FINDINGS: Transplant kidney location: Right lower quadrant Transplant Kidney: Renal measurements: 11.5 x 5.6 x 7.0 cm = volume: . Normal in size and parenchymal echogenicity. No evidence of mass or hydronephrosis. No peri-transplant fluid collection seen. Color flow in the main renal artery:  Yes Color flow in the main renal vein:  Yes Duplex Doppler Evaluation: Main Renal Artery Velocity: 102 cm/sec Main Renal Artery Resistive Index: 0.83 Venous waveform in main renal vein:  Present Intrarenal resistive index in upper pole:  0.67 (normal 0.6-0.8; equivocal 0.8-0.9; abnormal >= 0.9) Intrarenal resistive index in lower pole: 0.65 (normal 0.6-0.8; equivocal 0.8-0.9; abnormal >= 0.9) Bladder: Normal for degree of bladder distention. Other findings:  None. IMPRESSION: 1. Normal sonographic appearance of the right lower quadrant renal transplant. No hydronephrosis. 2. Normal duplex Doppler evaluation of the transplant kidney. Electronically Signed   By: Helyn Numbers M.D.   On: 10/01/2022 19:59   DG Chest Port 1 View  Result Date: 10/01/2022 CLINICAL  DATA:  Shortness of breath EXAM: PORTABLE CHEST 1 VIEW COMPARISON:  12/02/2021 FINDINGS: Normal cardiac and mediastinal contours. Increased left lower lung opacities. No pleural effusion or pneumothorax. No acute osseous abnormality. Aortic atherosclerosis. Numerous metallic BBs project over the left chest IMPRESSION: Increased left lower lung opacities, which may reflect atelectasis or infection. Electronically Signed   By: Wiliam Ke M.D.   On: 10/01/2022 16:39     Time coordinating discharge: Over 30 minutes    Lewie Chamber, MD  [facility] 10/03/2022, 3:03 PM

## 2022-10-03 NOTE — Progress Notes (Signed)
PHARMACIST - PHYSICIAN COMMUNICATION DR:   Frederick Peers CONCERNING: Antibiotic IV to Oral Route Change Policy  RECOMMENDATION: This patient is receiving azithromycin by the intravenous route.  Based on criteria approved by the Pharmacy and Therapeutics Committee, the antibiotic(s) is/are being converted to the equivalent oral dose form(s).   DESCRIPTION: These criteria include: Patient being treated for a respiratory tract infection, urinary tract infection, cellulitis or clostridium difficile associated diarrhea if on metronidazole The patient is not neutropenic and does not exhibit a GI malabsorption state The patient is eating (either orally or via tube) and/or has been taking other orally administered medications for a least 24 hours The patient is improving clinically and has a Tmax < 100.5  If you have questions about this conversion, please contact the Pharmacy Department  []   [phone] )  [facility] []   [phone] )  [facility]  []   [phone] )  [facility] [x]   [phone] )  [facility]   Thank you for allowing pharmacy to be a part of this patient's care.  Selinda Eon, PharmD, BCPS Clinical Pharmacist [facility] 10/03/2022 8:11 AM

## 2022-10-03 NOTE — Progress Notes (Signed)
   10/03/22 1149  TOC Brief Assessment  Insurance and Status Reviewed  Patient has primary care physician Yes Ninfa Meeker, Christopher, DO)  Home environment has been reviewed Yes (Home alone independent)  Prior level of function: Independent  Prior/Current Home Services No current home services  Social Determinants of Health Reivew SDOH reviewed no interventions necessary  Readmission risk has been reviewed Yes  Transition of care needs no transition of care needs at this time

## 2022-10-03 NOTE — Plan of Care (Signed)
  Problem: Education: Goal: Knowledge of risk factors and measures for prevention of condition will improve Outcome: Progressing   Problem: Respiratory: Goal: Complications related to the disease process, condition or treatment will be avoided or minimized Outcome: Progressing   Problem: Clinical Measurements: Goal: Ability to maintain clinical measurements within normal limits will improve Outcome: Progressing   Problem: Pain Managment: Goal: General experience of comfort will improve Outcome: Progressing   Problem: Safety: Goal: Ability to remain free from injury will improve Outcome: Progressing   Problem: Skin Integrity: Goal: Risk for impaired skin integrity will decrease Outcome: Progressing

## 2022-10-03 NOTE — Progress Notes (Signed)
PHARMACIST - PHYSICIAN COMMUNICATION  DR:   Frederick Peers  CONCERNING: IV to Oral Route Change Policy  RECOMMENDATION: This patient is receiving pantoprazole by the intravenous route.  Based on criteria approved by the Pharmacy and Therapeutics Committee, the intravenous medication(s) is/are being converted to the equivalent oral dose form(s).   DESCRIPTION: These criteria include: The patient is eating (either orally or via tube) and/or has been taking other orally administered medications for a least 24 hours The patient has no evidence of active gastrointestinal bleeding or impaired GI absorption (gastrectomy, short bowel, patient on TNA or NPO).  If you have questions about this conversion, please contact the Pharmacy Department  []   [phone] )  [facility] []   [phone] )  [facility] []   [phone] )  [facility] []   [phone] )  [facility] [x]   [phone] )  [facility]   [place] 10/03/2022 8:06 AM

## 2022-10-06 LAB — CULTURE, BLOOD (ROUTINE X 2)
Culture: NO GROWTH
Culture: NO GROWTH
Special Requests: ADEQUATE

## 2022-10-10 ENCOUNTER — Other Ambulatory Visit: Payer: Self-pay

## 2022-10-10 MED ORDER — OMEPRAZOLE 20 MG PO CPDR: 20.0000 mg | DELAYED_RELEASE_CAPSULE | Freq: Every day | ORAL | 0 refills | Status: DC

## 2022-10-11 ENCOUNTER — Other Ambulatory Visit (INDEPENDENT_AMBULATORY_CARE_PROVIDER_SITE_OTHER): Payer: Self-pay | Admitting: Family Medicine

## 2022-10-11 DIAGNOSIS — E559 Vitamin D deficiency, unspecified: Secondary | ICD-10-CM

## 2022-10-13 ENCOUNTER — Ambulatory Visit (INDEPENDENT_AMBULATORY_CARE_PROVIDER_SITE_OTHER): Payer: Medicare Other | Admitting: Family Medicine

## 2022-10-13 ENCOUNTER — Encounter (INDEPENDENT_AMBULATORY_CARE_PROVIDER_SITE_OTHER): Payer: Self-pay | Admitting: Family Medicine

## 2022-10-13 VITALS — BP 104/58 | HR 82 | Temp 97.7°F | Ht 66.0 in | Wt 169.0 lb

## 2022-10-13 DIAGNOSIS — E1165 Type 2 diabetes mellitus with hyperglycemia: Secondary | ICD-10-CM | POA: Diagnosis not present

## 2022-10-13 DIAGNOSIS — E559 Vitamin D deficiency, unspecified: Secondary | ICD-10-CM

## 2022-10-13 DIAGNOSIS — E669 Obesity, unspecified: Secondary | ICD-10-CM

## 2022-10-13 DIAGNOSIS — Z6827 Body mass index (BMI) 27.0-27.9, adult: Secondary | ICD-10-CM

## 2022-10-13 DIAGNOSIS — Z7985 Long-term (current) use of injectable non-insulin antidiabetic drugs: Secondary | ICD-10-CM

## 2022-10-13 MED ORDER — VITAMIN D (ERGOCALCIFEROL) 1.25 MG (50000 UNIT) PO CAPS
50000.0000 [IU] | ORAL_CAPSULE | ORAL | 0 refills | Status: DC
Start: 2022-10-13 — End: 2022-11-12

## 2022-10-13 NOTE — Progress Notes (Unsigned)
Chief Complaint:   OBESITY Kristen Moreno is here to discuss her progress with her obesity treatment plan along with follow-up of her obesity related diagnoses. Kristen Moreno is on keeping a food journal and adhering to recommended goals of 1100-1200 calories and 80+ grams of protein and states she is following her eating plan approximately 50% of the time. Kristen Moreno states she is walking for 30-45 minutes 3 times per week.  Today's visit was #: 9 Starting weight: 191 lbs Starting date: 03/27/2022 Today's weight: 169 lbs Today's date: 10/13/2022 Total lbs lost to date: 22 Total lbs lost since last in-office visit: 6  Interim History: Patient was hospitalized with COVID pneumonia.  She is working on getting her strength back.  Apart from the hospital she was working on getting more protein in.  She started off after the last appointment working more edamame and beans in.  She only just started getting her appetite back. She is interested in protein pasta options. Nothing planned in terms of activities or events or travel.   Subjective:   1. Vitamin D deficiency Patient is on prescription vitamin D.  She denies nausea, vomiting, or muscle weakness but notes fatigue.  2. Type 2 diabetes mellitus with hyperglycemia, without long-term current use of insulin (HCC) Patient is on Ozempic weekly.  Her blood sugars were elevated during recent hospitalization due to steroids.  Assessment/Plan:   1. Vitamin D deficiency We will refill vitamin D 50,000 IU once weekly for 1 month.  - Vitamin D, Ergocalciferol, (DRISDOL) 1.25 MG (50000 UNIT) CAPS capsule; Take 1 capsule (50,000 Units total) by mouth every 7 (seven) days.  Dispense: 4 capsule; Refill: 0  2. Type 2 diabetes mellitus with hyperglycemia, without long-term current use of insulin (HCC) Patient will continue Ozempic with no change in dose.  We will repeat labs in December.  3. BMI 27.0-27.9,adult  4. Obesity with starting BMI of  31.9 Kristen Moreno is currently in the action stage of change. As such, her goal is to continue with weight loss efforts. She has agreed to keeping a food journal and adhering to recommended goals of 1100-1200 calories and 80+ grams of protein daily.   Exercise goals: All adults should avoid inactivity. Some physical activity is better than none, and adults who participate in any amount of physical activity gain some health benefits.  Patient is to consider chair exercises through [employer].  Behavioral modification strategies: increasing lean protein intake, meal planning and cooking strategies, keeping healthy foods in the home, and planning for success.  Kristen Moreno has agreed to follow-up with our clinic in 4 weeks. She was informed of the importance of frequent follow-up visits to maximize her success with intensive lifestyle modifications for her multiple health conditions.   Objective:   Blood pressure (!) 104/58, pulse 82, temperature 97.7 F (36.5 C), height 5\' 6"  (1.676 m), weight 169 lb (76.7 kg), SpO2 95%. Body mass index is 27.28 kg/m.  General: Cooperative, alert, well developed, in no acute distress. HEENT: Conjunctivae and lids unremarkable. Cardiovascular: Regular rhythm.  Lungs: Normal work of breathing. Neurologic: No focal deficits.   Lab Results  Component Value Date   CREATININE 0.97 10/03/2022   BUN 20 10/03/2022   NA 137 10/03/2022   K 4.4 10/03/2022   CL 113 (H) 10/03/2022   CO2 16 (L) 10/03/2022   Lab Results  Component Value Date   ALT 17 10/01/2022   AST 17 10/01/2022   ALKPHOS 57 10/01/2022   BILITOT 0.5 10/01/2022  Lab Results  Component Value Date   HGBA1C 6.1 (A) 07/10/2022   HGBA1C 7.4 (H) 03/27/2022   HGBA1C 6.8 (A) 07/31/2021   HGBA1C 7.6 (A) 01/23/2021   HGBA1C 6.8 (A) 07/30/2020   Lab Results  Component Value Date   INSULIN 10.7 03/27/2022   Lab Results  Component Value Date   TSH 0.830 03/27/2022   Lab Results  Component Value  Date   CHOL 163 03/27/2022   HDL 55 03/27/2022   LDLCALC 87 03/27/2022   TRIG 121 03/27/2022   CHOLHDL 5.1 (H) 07/03/2015   Lab Results  Component Value Date   VD25OH 21.9 (L) 03/27/2022   VD25OH 13.1 (L) 04/01/2017   VD25OH 33 06/25/2009   Lab Results  Component Value Date   WBC 7.2 10/03/2022   HGB 11.3 (L) 10/03/2022   HCT 36.1 10/03/2022   MCV 95.3 10/03/2022   PLT 246 10/03/2022   Lab Results  Component Value Date   IRON 45 12/02/2017   TIBC 262 12/02/2017   FERRITIN 1,202 (H) 01/10/2021   Attestation Statements:   Reviewed by clinician on day of visit: allergies, medications, problem list, medical history, surgical history, family history, social history, and previous encounter notes.   I, Burt Knack, am acting as transcriptionist for Reuben Likes, MD.  I have reviewed the above documentation for accuracy and completeness, and I agree with the above. - Reuben Likes, MD

## 2022-10-26 ENCOUNTER — Other Ambulatory Visit (INDEPENDENT_AMBULATORY_CARE_PROVIDER_SITE_OTHER): Payer: Self-pay | Admitting: Family Medicine

## 2022-10-26 DIAGNOSIS — Z638 Other specified problems related to primary support group: Secondary | ICD-10-CM

## 2022-10-31 ENCOUNTER — Encounter (HOSPITAL_COMMUNITY): Payer: Self-pay

## 2022-11-04 ENCOUNTER — Encounter (HOSPITAL_COMMUNITY): Payer: Self-pay

## 2022-11-04 ENCOUNTER — Other Ambulatory Visit (INDEPENDENT_AMBULATORY_CARE_PROVIDER_SITE_OTHER): Payer: Self-pay | Admitting: Family Medicine

## 2022-11-04 DIAGNOSIS — E119 Type 2 diabetes mellitus without complications: Secondary | ICD-10-CM

## 2022-11-08 ENCOUNTER — Other Ambulatory Visit: Payer: Self-pay | Admitting: Student

## 2022-11-12 ENCOUNTER — Encounter (INDEPENDENT_AMBULATORY_CARE_PROVIDER_SITE_OTHER): Payer: Self-pay | Admitting: Family Medicine

## 2022-11-12 ENCOUNTER — Ambulatory Visit (INDEPENDENT_AMBULATORY_CARE_PROVIDER_SITE_OTHER): Payer: Medicare Other | Admitting: Family Medicine

## 2022-11-12 VITALS — BP 122/68 | HR 88 | Temp 98.1°F | Ht 66.0 in | Wt 172.0 lb

## 2022-11-12 DIAGNOSIS — E119 Type 2 diabetes mellitus without complications: Secondary | ICD-10-CM | POA: Diagnosis not present

## 2022-11-12 DIAGNOSIS — E559 Vitamin D deficiency, unspecified: Secondary | ICD-10-CM | POA: Diagnosis not present

## 2022-11-12 DIAGNOSIS — Z7985 Long-term (current) use of injectable non-insulin antidiabetic drugs: Secondary | ICD-10-CM

## 2022-11-12 DIAGNOSIS — Z6831 Body mass index (BMI) 31.0-31.9, adult: Secondary | ICD-10-CM

## 2022-11-12 DIAGNOSIS — E669 Obesity, unspecified: Secondary | ICD-10-CM

## 2022-11-12 DIAGNOSIS — Z6827 Body mass index (BMI) 27.0-27.9, adult: Secondary | ICD-10-CM

## 2022-11-12 DIAGNOSIS — Z638 Other specified problems related to primary support group: Secondary | ICD-10-CM

## 2022-11-12 MED ORDER — OZEMPIC (0.25 OR 0.5 MG/DOSE) 2 MG/3ML ~~LOC~~ SOPN
0.2500 mg | PEN_INJECTOR | SUBCUTANEOUS | 0 refills | Status: DC
Start: 2022-11-12 — End: 2023-01-29

## 2022-11-12 MED ORDER — VITAMIN D (ERGOCALCIFEROL) 1.25 MG (50000 UNIT) PO CAPS
50000.0000 [IU] | ORAL_CAPSULE | ORAL | 0 refills | Status: DC
Start: 2022-11-12 — End: 2023-01-29

## 2022-11-12 MED ORDER — SERTRALINE HCL 25 MG PO TABS
25.0000 mg | ORAL_TABLET | Freq: Every day | ORAL | 0 refills | Status: DC
Start: 2022-11-12 — End: 2023-04-13

## 2022-11-12 NOTE — Progress Notes (Signed)
Chief Complaint:   OBESITY Kristen Moreno is here to discuss her progress with her obesity treatment plan along with follow-up of her obesity related diagnoses. [name] is on keeping a food journal and adhering to recommended goals of 1100-1200 calories and 80+ grams of protein and states she is following her eating plan approximately 25% of the time. [name] states she is exercising 1 time per week.   Today's visit was #: 10 Starting weight: 191 lbs Starting date: 03/27/2022 Today's weight: 172 lbs Today's date: 11/12/2022 Total lbs lost to date: 19 Total lbs lost since last in-office visit: 0  Interim History: Patient has had two deaths in her family since last appointment and just returned back to work today.  Food wise she didn't feel she could commit consistently to the plan with everything going on. She is still helping her daughter out financially and feels somewhat in a bind financially.  She is hoping to not get served an eviction notice.    Subjective:   1. Vitamin D deficiency Patient is on prescription vitamin D.  She denies nausea, vomiting, or muscle weakness but notes fatigue.  2. Type 2 diabetes mellitus without complication, without long-term current use of insulin (HCC) Patient is on Ozempic with reasonably controlled blood sugars.  She denies GI side effects.  3. Stress due to family tension Patient is on sertraline with good symptom control.  She denies suicidal or homicidal ideations.  She notes her financial stress has increased as well.  Assessment/Plan:   1. Vitamin D deficiency We will refill vitamin D 50,000 IU once weekly for 1 month.  - Vitamin D, Ergocalciferol, (DRISDOL) 1.25 MG (50000 UNIT) CAPS capsule; Take 1 capsule (50,000 Units total) by mouth every 7 (seven) days.  Dispense: 4 capsule; Refill: 0  2. Type 2 diabetes mellitus without complication, without long-term current use of insulin (HCC) We will refill Ozempic 0.25 mg once weekly for 1  month.  We will repeat labs in December.  - Semaglutide,0.25 or 0.5MG /DOS, (OZEMPIC, 0.25 OR 0.5 MG/DOSE,) 2 MG/3ML SOPN; Inject 0.25 mg into the skin once a week.  Dispense: 3 mL; Refill: 0  3. Stress due to family tension We will refill sertraline 25 mg daily for 90 days.  - sertraline (ZOLOFT) 25 MG tablet; Take 1 tablet (25 mg total) by mouth daily.  Dispense: 90 tablet; Refill: 0  4. BMI 27.0-27.9,adult  5. Obesity with starting BMI of 31.9 [name] is currently in the action stage of change. As such, her goal is to continue with weight loss efforts. She has agreed to keeping a food journal and adhering to recommended goals of 1100-1200 calories and 80+ grams of protein daily.   Exercise goals: All adults should avoid inactivity. Some physical activity is better than none, and adults who participate in any amount of physical activity gain some health benefits.  Behavioral modification strategies: increasing lean protein intake, meal planning and cooking strategies, keeping healthy foods in the home, and planning for success.  [name] has agreed to follow-up with our clinic in 3 to 5 weeks. She was informed of the importance of frequent follow-up visits to maximize her success with intensive lifestyle modifications for her multiple health conditions.   Objective:   Blood pressure 122/68, pulse 88, temperature 98.1 F (36.7 C), height 5\' 6"  (1.676 m), weight 172 lb (78 kg), SpO2 97%. Body mass index is 27.76 kg/m.  General: Cooperative, alert, well developed, in no acute distress. HEENT: Conjunctivae and lids  unremarkable. Cardiovascular: Regular rhythm.  Lungs: Normal work of breathing. Neurologic: No focal deficits.   Lab Results  Component Value Date   CREATININE 0.97 10/03/2022   BUN 20 10/03/2022   NA 137 10/03/2022   K 4.4 10/03/2022   CL 113 (H) 10/03/2022   CO2 16 (L) 10/03/2022   Lab Results  Component Value Date   ALT 17 10/01/2022   AST 17 10/01/2022    ALKPHOS 57 10/01/2022   BILITOT 0.5 10/01/2022   Lab Results  Component Value Date   HGBA1C 6.1 (A) 07/10/2022   HGBA1C 7.4 (H) 03/27/2022   HGBA1C 6.8 (A) 07/31/2021   HGBA1C 7.6 (A) 01/23/2021   HGBA1C 6.8 (A) 07/30/2020   Lab Results  Component Value Date   INSULIN 10.7 03/27/2022   Lab Results  Component Value Date   TSH 0.830 03/27/2022   Lab Results  Component Value Date   CHOL 163 03/27/2022   HDL 55 03/27/2022   LDLCALC 87 03/27/2022   TRIG 121 03/27/2022   CHOLHDL 5.1 (H) 07/03/2015   Lab Results  Component Value Date   VD25OH 21.9 (L) 03/27/2022   VD25OH 13.1 (L) 04/01/2017   VD25OH 33 06/25/2009   Lab Results  Component Value Date   WBC 7.2 10/03/2022   HGB 11.3 (L) 10/03/2022   HCT 36.1 10/03/2022   MCV 95.3 10/03/2022   PLT 246 10/03/2022   Lab Results  Component Value Date   IRON 45 12/02/2017   TIBC 262 12/02/2017   FERRITIN 1,202 (H) 01/10/2021   Attestation Statements:   Reviewed by clinician on day of visit: allergies, medications, problem list, medical history, surgical history, family history, social history, and previous encounter notes.   I, Burt Knack, am acting as transcriptionist for Reuben Likes, MD. I have reviewed the above documentation for accuracy and completeness, and I agree with the above. - Reuben Likes, MD

## 2022-12-04 ENCOUNTER — Other Ambulatory Visit (INDEPENDENT_AMBULATORY_CARE_PROVIDER_SITE_OTHER): Payer: Self-pay | Admitting: Family Medicine

## 2022-12-04 DIAGNOSIS — E559 Vitamin D deficiency, unspecified: Secondary | ICD-10-CM

## 2022-12-09 ENCOUNTER — Other Ambulatory Visit (INDEPENDENT_AMBULATORY_CARE_PROVIDER_SITE_OTHER): Payer: Self-pay | Admitting: Family Medicine

## 2022-12-09 DIAGNOSIS — E119 Type 2 diabetes mellitus without complications: Secondary | ICD-10-CM

## 2022-12-15 ENCOUNTER — Other Ambulatory Visit: Payer: Self-pay | Admitting: Student

## 2022-12-17 ENCOUNTER — Ambulatory Visit (INDEPENDENT_AMBULATORY_CARE_PROVIDER_SITE_OTHER): Payer: Medicare Other | Admitting: Family Medicine

## 2022-12-31 ENCOUNTER — Encounter (HOSPITAL_COMMUNITY): Payer: Self-pay

## 2023-01-06 DIAGNOSIS — N189 Chronic kidney disease, unspecified: Secondary | ICD-10-CM | POA: Diagnosis not present

## 2023-01-06 DIAGNOSIS — R799 Abnormal finding of blood chemistry, unspecified: Secondary | ICD-10-CM | POA: Diagnosis not present

## 2023-01-06 DIAGNOSIS — Z940 Kidney transplant status: Secondary | ICD-10-CM | POA: Diagnosis not present

## 2023-01-06 DIAGNOSIS — R110 Nausea: Secondary | ICD-10-CM | POA: Diagnosis not present

## 2023-01-06 DIAGNOSIS — N2581 Secondary hyperparathyroidism of renal origin: Secondary | ICD-10-CM | POA: Diagnosis not present

## 2023-01-08 ENCOUNTER — Encounter (HOSPITAL_COMMUNITY): Payer: Self-pay

## 2023-01-08 DIAGNOSIS — Z940 Kidney transplant status: Secondary | ICD-10-CM | POA: Diagnosis not present

## 2023-01-08 DIAGNOSIS — N2581 Secondary hyperparathyroidism of renal origin: Secondary | ICD-10-CM | POA: Diagnosis not present

## 2023-01-08 DIAGNOSIS — I129 Hypertensive chronic kidney disease with stage 1 through stage 4 chronic kidney disease, or unspecified chronic kidney disease: Secondary | ICD-10-CM | POA: Diagnosis not present

## 2023-01-08 DIAGNOSIS — D631 Anemia in chronic kidney disease: Secondary | ICD-10-CM | POA: Diagnosis not present

## 2023-01-09 ENCOUNTER — Encounter (HOSPITAL_COMMUNITY): Payer: Self-pay

## 2023-01-09 ENCOUNTER — Ambulatory Visit: Payer: Medicaid Other | Admitting: Student

## 2023-01-09 VITALS — BP 125/58 | HR 85 | Temp 97.9°F | Wt 172.6 lb

## 2023-01-09 DIAGNOSIS — E119 Type 2 diabetes mellitus without complications: Secondary | ICD-10-CM

## 2023-01-09 DIAGNOSIS — I10 Essential (primary) hypertension: Secondary | ICD-10-CM | POA: Diagnosis not present

## 2023-01-09 DIAGNOSIS — Z7985 Long-term (current) use of injectable non-insulin antidiabetic drugs: Secondary | ICD-10-CM | POA: Diagnosis not present

## 2023-01-09 DIAGNOSIS — L720 Epidermal cyst: Secondary | ICD-10-CM | POA: Diagnosis not present

## 2023-01-09 DIAGNOSIS — Z0000 Encounter for general adult medical examination without abnormal findings: Secondary | ICD-10-CM

## 2023-01-09 LAB — POCT GLYCOSYLATED HEMOGLOBIN (HGB A1C): Hemoglobin A1C: 6.3 % — AB (ref 4.0–5.6)

## 2023-01-09 LAB — GLUCOSE, CAPILLARY: Glucose-Capillary: 93 mg/dL (ref 70–99)

## 2023-01-09 MED ORDER — AMLODIPINE BESYLATE 10 MG PO TABS: 10.0000 mg | ORAL_TABLET | Freq: Every day | ORAL | 3 refills | Status: DC

## 2023-01-09 NOTE — Assessment & Plan Note (Addendum)
A1c today = 6.3 (6.1 in 06/2022). Foot exam performed and is unremarkable.  -Continue Ozempic

## 2023-01-09 NOTE — Patient Instructions (Signed)
I have sent a referral to general surgery to take care of the cyst on your back.   Follow-up in 3 months.
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
## 2023-01-09 NOTE — Assessment & Plan Note (Deleted)
Flu vaccine. Ophthalmology. Shingrix.

## 2023-01-09 NOTE — Assessment & Plan Note (Signed)
Unremarkable BP -Refilled Amlodipine

## 2023-01-09 NOTE — Assessment & Plan Note (Addendum)
First noticed bump 5+ years ago but within the last week it has grown in size and his now very tender to the touch. Has not been seen for this previously and has not tried any at-home intervention. See physical exam for description.   -Referral sent to general surgery -Warm compresses

## 2023-01-10 DIAGNOSIS — E663 Overweight: Secondary | ICD-10-CM | POA: Diagnosis not present

## 2023-01-10 DIAGNOSIS — M549 Dorsalgia, unspecified: Secondary | ICD-10-CM | POA: Diagnosis not present

## 2023-01-10 DIAGNOSIS — M25512 Pain in left shoulder: Secondary | ICD-10-CM | POA: Diagnosis not present

## 2023-01-10 DIAGNOSIS — Z79899 Other long term (current) drug therapy: Secondary | ICD-10-CM | POA: Diagnosis not present

## 2023-01-10 DIAGNOSIS — Z940 Kidney transplant status: Secondary | ICD-10-CM | POA: Diagnosis not present

## 2023-01-10 DIAGNOSIS — E119 Type 2 diabetes mellitus without complications: Secondary | ICD-10-CM | POA: Diagnosis not present

## 2023-01-10 DIAGNOSIS — G8929 Other chronic pain: Secondary | ICD-10-CM | POA: Diagnosis not present

## 2023-01-10 DIAGNOSIS — E559 Vitamin D deficiency, unspecified: Secondary | ICD-10-CM | POA: Diagnosis not present

## 2023-01-10 DIAGNOSIS — M25551 Pain in right hip: Secondary | ICD-10-CM | POA: Diagnosis not present

## 2023-01-10 DIAGNOSIS — M25511 Pain in right shoulder: Secondary | ICD-10-CM | POA: Diagnosis not present

## 2023-01-12 NOTE — Progress Notes (Signed)
Internal Medicine Clinic Attending  I was physically present during the key portions of the resident provided service and participated in the medical decision making of patient's management care. I reviewed pertinent patient test results.  The assessment, diagnosis, and plan were formulated together and I agree with the documentation in the resident's note.  Gust Rung, DO

## 2023-01-13 DIAGNOSIS — Z79899 Other long term (current) drug therapy: Secondary | ICD-10-CM | POA: Diagnosis not present

## 2023-01-18 ENCOUNTER — Ambulatory Visit
Admission: EM | Admit: 2023-01-18 | Discharge: 2023-01-18 | Disposition: A | Discharge status: Home / Self Care | Payer: Medicaid Other | Attending: Physician Assistant | Admitting: Physician Assistant

## 2023-01-18 DIAGNOSIS — L02212 Cutaneous abscess of back [any part, except buttock]: Secondary | ICD-10-CM | POA: Diagnosis not present

## 2023-01-18 MED ORDER — MUPIROCIN 2 % EX OINT: 1.0000 | TOPICAL_OINTMENT | Freq: Two times a day (BID) | CUTANEOUS | 0 refills | Status: DC

## 2023-01-18 MED ORDER — DOXYCYCLINE HYCLATE 100 MG PO CAPS: 100.0000 mg | ORAL_CAPSULE | Freq: Two times a day (BID) | ORAL | 0 refills | Status: DC
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
## 2023-01-18 NOTE — ED Triage Notes (Signed)
Patient with cyst on back. States it has been there for awhile but started draining 3-4 days ago. States it is continuing to drain and has a terrible odor.

## 2023-01-18 NOTE — Discharge Instructions (Signed)
Follow-up with surgery tomorrow as discussed.  Start doxycycline 100 mg twice daily for 10 days.  Stay out of the sun while on this medication.  Keep the area clean and apply Bactroban ointment with dressing changes.  Use Tylenol for pain.  If anything worsens or changes and you have increasing pain, swelling, fever, nausea, vomiting you need to be seen immediately.

## 2023-01-19 DIAGNOSIS — L723 Sebaceous cyst: Secondary | ICD-10-CM | POA: Diagnosis not present

## 2023-01-19 DIAGNOSIS — L089 Local infection of the skin and subcutaneous tissue, unspecified: Secondary | ICD-10-CM | POA: Diagnosis not present

## 2023-01-20 ENCOUNTER — Other Ambulatory Visit: Payer: Self-pay | Admitting: Student

## 2023-01-22 ENCOUNTER — Encounter (HOSPITAL_COMMUNITY): Payer: Self-pay

## 2023-01-24 LAB — AEROBIC CULTURE W GRAM STAIN (SUPERFICIAL SPECIMEN)
Gram Stain: NONE SEEN
Special Requests: NORMAL

## 2023-01-29 ENCOUNTER — Encounter (INDEPENDENT_AMBULATORY_CARE_PROVIDER_SITE_OTHER): Payer: Self-pay | Admitting: Family Medicine

## 2023-01-29 ENCOUNTER — Ambulatory Visit (INDEPENDENT_AMBULATORY_CARE_PROVIDER_SITE_OTHER): Payer: Medicaid Other | Admitting: Family Medicine

## 2023-01-29 VITALS — BP 139/79 | HR 64 | Temp 98.0°F | Ht 66.0 in | Wt 168.0 lb

## 2023-01-29 DIAGNOSIS — E669 Obesity, unspecified: Secondary | ICD-10-CM | POA: Diagnosis not present

## 2023-01-29 DIAGNOSIS — E119 Type 2 diabetes mellitus without complications: Secondary | ICD-10-CM | POA: Diagnosis not present

## 2023-01-29 DIAGNOSIS — Z6831 Body mass index (BMI) 31.0-31.9, adult: Secondary | ICD-10-CM

## 2023-01-29 DIAGNOSIS — Z7985 Long-term (current) use of injectable non-insulin antidiabetic drugs: Secondary | ICD-10-CM | POA: Diagnosis not present

## 2023-01-29 DIAGNOSIS — E559 Vitamin D deficiency, unspecified: Secondary | ICD-10-CM

## 2023-01-29 DIAGNOSIS — Z6827 Body mass index (BMI) 27.0-27.9, adult: Secondary | ICD-10-CM | POA: Diagnosis not present

## 2023-01-29 MED ORDER — OZEMPIC (0.25 OR 0.5 MG/DOSE) 2 MG/3ML ~~LOC~~ SOPN: 0.2500 mg | PEN_INJECTOR | SUBCUTANEOUS | 0 refills | Status: DC

## 2023-01-29 NOTE — Progress Notes (Signed)
   SUBJECTIVE:  Chief Complaint: Obesity  Interim History: Patient cooked over Thanksgiving and Christmas.  She has been back and forth going out of town to [place] to see her brother who has had 5 strokes who arrested and then was revived.  This has been very stressful for her.  She also had a cyst on her back and needed that to be excised and drained.  She has been walking consistently due to work.  She has been eating frozen meals that are from the list given to her and beans and green beans.  She has hardly been eating.   Kristen Moreno is here to discuss her progress with her obesity treatment plan. She is on the keeping a food journal and adhering to recommended goals of 1100-1200 calories and 80 grams of protein and states she is following her eating plan approximately 25 % of the time. She states she is walking.  OBJECTIVE: Visit Diagnoses: Problem List Items Addressed This Visit       Endocrine   DM (diabetes mellitus) (HCC) - Primary   On 0.25mg  ozempic  weekly.  She is under a significant amount of familial stress with her brothers recent strokes and her daughters possible recurrent cancer.  Most recent A1c was 6.3.  She needs a refill today.  No change in dosage.      Relevant Medications   Semaglutide ,0.25 or 0.5MG /DOS, (OZEMPIC , 0.25 OR 0.5 MG/DOSE,) 2 MG/3ML SOPN     Other   Vitamin D  deficiency   Last Vitamin D  level from March.  Has been on rx and will hold refill until next level to avoid over replacement.  No nausea, vomiting or muscle weakness noted.      Other Visit Diagnoses       Obesity with starting BMI of 31.9       Relevant Medications   Semaglutide ,0.25 or 0.5MG /DOS, (OZEMPIC , 0.25 OR 0.5 MG/DOSE,) 2 MG/3ML SOPN     BMI 27.0-27.9,adult           No data recorded No data recorded No data recorded No data recorded   ASSESSMENT AND PLAN:  Diet: Kristen Moreno is currently in the action stage of change. As such, her goal is to continue with weight loss  efforts. She has agreed to keeping a food journal and adhering to recommended goals of 1100-1200 calories and 80 or grams of protein daily.  Exercise: Kristen Moreno has been instructed that some exercise is better than none for weight loss and overall health benefits.   Behavior Modification:  We discussed the following Behavioral Modification Strategies today: increasing lean protein intake, increasing vegetables, meal planning and cooking strategies, better snacking choices, and planning for success. \  No follow-ups on file.Kristen Moreno She was informed of the importance of frequent follow up visits to maximize her success with intensive lifestyle modifications for her multiple health conditions.  Attestation Statements:   Reviewed by clinician on day of visit: allergies, medications, problem list, medical history, surgical history, family history, social history, and previous encounter notes.    Kristen Cho, MD

## 2023-01-29 NOTE — Assessment & Plan Note (Signed)
 On 0.25mg  ozempic weekly.  She is under a significant amount of familial stress with her brothers recent strokes and her daughters possible recurrent cancer.  Most recent A1c was 6.3.  She needs a refill today.  No change in dosage.

## 2023-02-04 DIAGNOSIS — M549 Dorsalgia, unspecified: Secondary | ICD-10-CM | POA: Diagnosis not present

## 2023-02-04 DIAGNOSIS — M25512 Pain in left shoulder: Secondary | ICD-10-CM | POA: Diagnosis not present

## 2023-02-04 DIAGNOSIS — I10 Essential (primary) hypertension: Secondary | ICD-10-CM | POA: Diagnosis not present

## 2023-02-04 DIAGNOSIS — M25511 Pain in right shoulder: Secondary | ICD-10-CM | POA: Diagnosis not present

## 2023-02-04 DIAGNOSIS — Z79899 Other long term (current) drug therapy: Secondary | ICD-10-CM | POA: Diagnosis not present

## 2023-02-04 DIAGNOSIS — Z6829 Body mass index (BMI) 29.0-29.9, adult: Secondary | ICD-10-CM | POA: Diagnosis not present

## 2023-02-04 DIAGNOSIS — M25551 Pain in right hip: Secondary | ICD-10-CM | POA: Diagnosis not present

## 2023-02-05 NOTE — Assessment & Plan Note (Signed)
Last Vitamin D level from March.  Has been on rx and will hold refill until next level to avoid over replacement.  No nausea, vomiting or muscle weakness noted.

## 2023-02-06 DIAGNOSIS — Z79899 Other long term (current) drug therapy: Secondary | ICD-10-CM | POA: Diagnosis not present

## 2023-02-08 ENCOUNTER — Other Ambulatory Visit: Payer: Self-pay | Admitting: Cardiovascular Disease

## 2023-02-18 ENCOUNTER — Other Ambulatory Visit: Payer: Self-pay | Admitting: Student

## 2023-02-24 ENCOUNTER — Encounter (HOSPITAL_COMMUNITY): Payer: Self-pay

## 2023-03-02 ENCOUNTER — Other Ambulatory Visit (INDEPENDENT_AMBULATORY_CARE_PROVIDER_SITE_OTHER): Payer: Self-pay | Admitting: Family Medicine

## 2023-03-02 DIAGNOSIS — L723 Sebaceous cyst: Secondary | ICD-10-CM | POA: Diagnosis not present

## 2023-03-02 DIAGNOSIS — L720 Epidermal cyst: Secondary | ICD-10-CM | POA: Diagnosis not present

## 2023-03-02 DIAGNOSIS — E119 Type 2 diabetes mellitus without complications: Secondary | ICD-10-CM

## 2023-03-03 ENCOUNTER — Other Ambulatory Visit (INDEPENDENT_AMBULATORY_CARE_PROVIDER_SITE_OTHER): Payer: Self-pay | Admitting: Family Medicine

## 2023-03-03 DIAGNOSIS — Z638 Other specified problems related to primary support group: Secondary | ICD-10-CM

## 2023-03-11 ENCOUNTER — Telehealth (INDEPENDENT_AMBULATORY_CARE_PROVIDER_SITE_OTHER): Payer: Medicare Other | Admitting: Family Medicine

## 2023-03-11 ENCOUNTER — Encounter (INDEPENDENT_AMBULATORY_CARE_PROVIDER_SITE_OTHER): Payer: Self-pay | Admitting: Family Medicine

## 2023-03-11 DIAGNOSIS — Z940 Kidney transplant status: Secondary | ICD-10-CM | POA: Diagnosis not present

## 2023-03-11 DIAGNOSIS — R892 Abnormal level of other drugs, medicaments and biological substances in specimens from other organs, systems and tissues: Secondary | ICD-10-CM | POA: Diagnosis not present

## 2023-03-11 DIAGNOSIS — G8929 Other chronic pain: Secondary | ICD-10-CM | POA: Diagnosis not present

## 2023-03-11 DIAGNOSIS — M25512 Pain in left shoulder: Secondary | ICD-10-CM | POA: Diagnosis not present

## 2023-03-11 DIAGNOSIS — E663 Overweight: Secondary | ICD-10-CM | POA: Diagnosis not present

## 2023-03-11 DIAGNOSIS — Z79899 Other long term (current) drug therapy: Secondary | ICD-10-CM | POA: Diagnosis not present

## 2023-03-11 DIAGNOSIS — E559 Vitamin D deficiency, unspecified: Secondary | ICD-10-CM | POA: Diagnosis not present

## 2023-03-11 DIAGNOSIS — M549 Dorsalgia, unspecified: Secondary | ICD-10-CM | POA: Diagnosis not present

## 2023-03-11 DIAGNOSIS — E119 Type 2 diabetes mellitus without complications: Secondary | ICD-10-CM | POA: Diagnosis not present

## 2023-03-11 DIAGNOSIS — M25511 Pain in right shoulder: Secondary | ICD-10-CM | POA: Diagnosis not present

## 2023-03-11 DIAGNOSIS — M25551 Pain in right hip: Secondary | ICD-10-CM | POA: Diagnosis not present

## 2023-03-13 DIAGNOSIS — Z79899 Other long term (current) drug therapy: Secondary | ICD-10-CM | POA: Diagnosis not present

## 2023-04-02 ENCOUNTER — Other Ambulatory Visit (INDEPENDENT_AMBULATORY_CARE_PROVIDER_SITE_OTHER): Payer: Self-pay | Admitting: Family Medicine

## 2023-04-02 DIAGNOSIS — Z638 Other specified problems related to primary support group: Secondary | ICD-10-CM

## 2023-04-06 ENCOUNTER — Other Ambulatory Visit (INDEPENDENT_AMBULATORY_CARE_PROVIDER_SITE_OTHER): Payer: Self-pay | Admitting: Family Medicine

## 2023-04-06 DIAGNOSIS — E119 Type 2 diabetes mellitus without complications: Secondary | ICD-10-CM

## 2023-04-08 DIAGNOSIS — M549 Dorsalgia, unspecified: Secondary | ICD-10-CM | POA: Diagnosis not present

## 2023-04-08 DIAGNOSIS — Z0000 Encounter for general adult medical examination without abnormal findings: Secondary | ICD-10-CM | POA: Diagnosis not present

## 2023-04-08 DIAGNOSIS — M25512 Pain in left shoulder: Secondary | ICD-10-CM | POA: Diagnosis not present

## 2023-04-08 DIAGNOSIS — Z940 Kidney transplant status: Secondary | ICD-10-CM | POA: Diagnosis not present

## 2023-04-08 DIAGNOSIS — E119 Type 2 diabetes mellitus without complications: Secondary | ICD-10-CM | POA: Diagnosis not present

## 2023-04-08 DIAGNOSIS — G8929 Other chronic pain: Secondary | ICD-10-CM | POA: Diagnosis not present

## 2023-04-08 DIAGNOSIS — Z79899 Other long term (current) drug therapy: Secondary | ICD-10-CM | POA: Diagnosis not present

## 2023-04-08 DIAGNOSIS — M25551 Pain in right hip: Secondary | ICD-10-CM | POA: Diagnosis not present

## 2023-04-08 DIAGNOSIS — M25511 Pain in right shoulder: Secondary | ICD-10-CM | POA: Diagnosis not present

## 2023-04-08 DIAGNOSIS — E559 Vitamin D deficiency, unspecified: Secondary | ICD-10-CM | POA: Diagnosis not present

## 2023-04-08 DIAGNOSIS — E663 Overweight: Secondary | ICD-10-CM | POA: Diagnosis not present

## 2023-04-10 DIAGNOSIS — Z79899 Other long term (current) drug therapy: Secondary | ICD-10-CM | POA: Diagnosis not present

## 2023-04-13 ENCOUNTER — Encounter (INDEPENDENT_AMBULATORY_CARE_PROVIDER_SITE_OTHER): Payer: Self-pay | Admitting: Family Medicine

## 2023-04-13 ENCOUNTER — Ambulatory Visit (INDEPENDENT_AMBULATORY_CARE_PROVIDER_SITE_OTHER): Payer: Medicare Other | Admitting: Family Medicine

## 2023-04-13 VITALS — BP 135/71 | HR 103 | Temp 98.4°F | Ht 66.0 in | Wt 168.0 lb

## 2023-04-13 DIAGNOSIS — Z7985 Long-term (current) use of injectable non-insulin antidiabetic drugs: Secondary | ICD-10-CM | POA: Diagnosis not present

## 2023-04-13 DIAGNOSIS — Z6827 Body mass index (BMI) 27.0-27.9, adult: Secondary | ICD-10-CM

## 2023-04-13 DIAGNOSIS — E669 Obesity, unspecified: Secondary | ICD-10-CM

## 2023-04-13 DIAGNOSIS — Z638 Other specified problems related to primary support group: Secondary | ICD-10-CM | POA: Diagnosis not present

## 2023-04-13 DIAGNOSIS — E119 Type 2 diabetes mellitus without complications: Secondary | ICD-10-CM | POA: Diagnosis not present

## 2023-04-13 MED ORDER — SERTRALINE HCL 25 MG PO TABS: 25.0000 mg | ORAL_TABLET | Freq: Every day | ORAL | 0 refills | Status: DC

## 2023-04-13 MED ORDER — OZEMPIC (0.25 OR 0.5 MG/DOSE) 2 MG/3ML ~~LOC~~ SOPN: 0.2500 mg | PEN_INJECTOR | SUBCUTANEOUS | 0 refills | Status: DC

## 2023-04-13 NOTE — Progress Notes (Signed)
 SUBJECTIVE:  Chief Complaint: Obesity  Interim History: Patient has had a very difficult last few months. Mental health wise she is struggling.  She is trying to stay home by herself and cooking to help deal with her grief.  Her grandson was killed and there court coming up in April.  She isn't sleeping much and is only getting about 4 hours of sleep. She is trying to get in at least 1 nutritious meal in a day.   Kristen Moreno is here to discuss her progress with her obesity treatment plan. She is on the keeping a food journal and adhering to recommended goals of 1100-1200 calories and 80 protein and states she is following her eating plan approximately 65 % of the time. She states she is walking.  OBJECTIVE: Visit Diagnoses: Problem List Items Addressed This Visit       Endocrine   DM (diabetes mellitus) (HCC)   On Ozempic with better glucose control and helping with carbohydrate cravings and indulgence.  Needs refill of ozempic today.  Reassess dose at next month.      Relevant Medications   Semaglutide,0.25 or 0.5MG /DOS, (OZEMPIC, 0.25 OR 0.5 MG/DOSE,) 2 MG/3ML SOPN     Other   Generalized obesity: Starting BMI 31.78 - Primary   Anthropometric Measurements Height: 5\' 6"  (1.676 m) Weight: 168 lb (76.2 kg) BMI (Calculated): 27.13 Weight at Last Visit: 168 lb Weight Lost Since Last Visit: 0 Weight Gained Since Last Visit: 0 Starting Weight: 191 lb Total Weight Loss (lbs): 23 lb (10.4 kg) Body Composition  Body Fat %: 39.1 % Fat Mass (lbs): 66 lbs Muscle Mass (lbs): 97.4 lbs Total Body Water (lbs): 74.6 lbs Visceral Fat Rating : 9 Other Clinical Data Today's Visit #: 12 Starting Date: 03/27/22 Comments: 1100-1200/80       Relevant Medications   Semaglutide,0.25 or 0.5MG /DOS, (OZEMPIC, 0.25 OR 0.5 MG/DOSE,) 2 MG/3ML SOPN   Stress due to family tension   Patient's grandson recently murdered.  She is dealing with significant grief, understandably so.  She is awaiting  upcoming court date.  Needs a refill on sertraline and patient encouraged to increase dose to 50mg  daily. No suicidal or homicidal ideation.      Relevant Medications   sertraline (ZOLOFT) 25 MG tablet    No data recorded       04/17/2023    2:00 PM 04/13/2023    3:00 PM 01/29/2023    3:00 PM  Vitals with BMI  Height  5\' 6"  5\' 6"   Weight  168 lbs 168 lbs  BMI  27.13 27.13  Systolic 126 135 161  Diastolic 67 71 79  Pulse 86 103 64      ASSESSMENT AND PLAN:  Diet: Kristen Moreno is currently in the action stage of change. As such, her goal is to continue with weight loss efforts and has agreed to practicing portion control and making smarter food choices, such as increasing vegetables and decreasing simple carbohydrates.   Exercise:  All adults should avoid inactivity. Some activity is better than none, and adults who participate in any amount of physical activity, gain some health benefits.  Behavior Modification:  We discussed the following Behavioral Modification Strategies today: increasing lean protein intake, decreasing simple carbohydrates, increasing vegetables, and emotional eating strategies .   Return in about 5 weeks (around 05/18/2023).Marland Kitchen She was informed of the importance of frequent follow up visits to maximize her success with intensive lifestyle modifications for her multiple health conditions.  Attestation Statements:  Reviewed by clinician on day of visit: allergies, medications, problem list, medical history, surgical history, family history, social history, and previous encounter notes.   Reuben Likes, MD

## 2023-04-17 ENCOUNTER — Encounter: Payer: Self-pay | Admitting: *Deleted

## 2023-04-17 ENCOUNTER — Other Ambulatory Visit: Payer: Self-pay

## 2023-04-17 ENCOUNTER — Ambulatory Visit: Admission: EM | Admit: 2023-04-17 | Discharge: 2023-04-17 | Disposition: A | Discharge status: Home / Self Care

## 2023-04-17 DIAGNOSIS — R238 Other skin changes: Secondary | ICD-10-CM | POA: Diagnosis present

## 2023-04-17 DIAGNOSIS — L290 Pruritus ani: Secondary | ICD-10-CM | POA: Diagnosis not present

## 2023-04-17 DIAGNOSIS — R21 Rash and other nonspecific skin eruption: Secondary | ICD-10-CM | POA: Diagnosis not present

## 2023-04-17 MED ORDER — HYDROCORTISONE 2.5 % EX LOTN: TOPICAL_LOTION | Freq: Two times a day (BID) | CUTANEOUS | 0 refills | Status: DC

## 2023-04-17 NOTE — Discharge Instructions (Signed)
 We will send your test this may take up to 72 hours to return Check MyChart for results we will contact with any positive results Wash dry hands well Apply cream twice a day Avoid popping blisters Keep area dry

## 2023-04-17 NOTE — ED Triage Notes (Addendum)
 Pt reports "rash between my cheeks" x 2 days. States has had rash in this area before. Pt states rash is "itchy and sore". Pt has hx of diabetes and is a kidney transplant recipient.
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
## 2023-04-18 LAB — HSV 1/2 PCR (SURFACE)
HSV-1 DNA: NOT DETECTED
HSV-2 DNA: NOT DETECTED

## 2023-04-20 DIAGNOSIS — Z638 Other specified problems related to primary support group: Secondary | ICD-10-CM | POA: Insufficient documentation

## 2023-04-20 NOTE — Assessment & Plan Note (Signed)
 Patient's grandson recently murdered.  She is dealing with significant grief, understandably so.  She is awaiting upcoming court date.  Needs a refill on sertraline and patient encouraged to increase dose to 50mg  daily. No suicidal or homicidal ideation.

## 2023-04-20 NOTE — Assessment & Plan Note (Signed)
 Anthropometric Measurements Height: 5\' 6"  (1.676 m) Weight: 168 lb (76.2 kg) BMI (Calculated): 27.13 Weight at Last Visit: 168 lb Weight Lost Since Last Visit: 0 Weight Gained Since Last Visit: 0 Starting Weight: 191 lb Total Weight Loss (lbs): 23 lb (10.4 kg) Body Composition  Body Fat %: 39.1 % Fat Mass (lbs): 66 lbs Muscle Mass (lbs): 97.4 lbs Total Body Water (lbs): 74.6 lbs Visceral Fat Rating : 9 Other Clinical Data Today's Visit #: 12 Starting Date: 03/27/22 Comments: 1100-1200/80

## 2023-04-20 NOTE — Assessment & Plan Note (Addendum)
 On Ozempic with better glucose control and helping with carbohydrate cravings and indulgence.  Needs refill of ozempic today.  Reassess dose at next month.

## 2023-05-06 DIAGNOSIS — M25512 Pain in left shoulder: Secondary | ICD-10-CM | POA: Diagnosis not present

## 2023-05-06 DIAGNOSIS — E119 Type 2 diabetes mellitus without complications: Secondary | ICD-10-CM | POA: Diagnosis not present

## 2023-05-06 DIAGNOSIS — E559 Vitamin D deficiency, unspecified: Secondary | ICD-10-CM | POA: Diagnosis not present

## 2023-05-06 DIAGNOSIS — Z940 Kidney transplant status: Secondary | ICD-10-CM | POA: Diagnosis not present

## 2023-05-06 DIAGNOSIS — G8929 Other chronic pain: Secondary | ICD-10-CM | POA: Diagnosis not present

## 2023-05-06 DIAGNOSIS — M129 Arthropathy, unspecified: Secondary | ICD-10-CM | POA: Diagnosis not present

## 2023-05-06 DIAGNOSIS — M25511 Pain in right shoulder: Secondary | ICD-10-CM | POA: Diagnosis not present

## 2023-05-06 DIAGNOSIS — E663 Overweight: Secondary | ICD-10-CM | POA: Diagnosis not present

## 2023-05-06 DIAGNOSIS — Z79899 Other long term (current) drug therapy: Secondary | ICD-10-CM | POA: Diagnosis not present

## 2023-05-06 DIAGNOSIS — M25551 Pain in right hip: Secondary | ICD-10-CM | POA: Diagnosis not present

## 2023-05-06 DIAGNOSIS — M549 Dorsalgia, unspecified: Secondary | ICD-10-CM | POA: Diagnosis not present

## 2023-05-07 ENCOUNTER — Other Ambulatory Visit: Payer: Self-pay | Admitting: Student

## 2023-05-07 NOTE — Telephone Encounter (Signed)
 Copied from CRM [id number]. Topic: Clinical - Medication Refill >> May 07, 2023  4:37 PM Maryln Sober wrote: Most Recent Primary Care Visit:  Provider: Ronni Colace  Department: [facility]  Visit Type: OPEN ESTABLISHED  Date: 01/09/2023  Medication: omeprazole (PRILOSEC) 20 MG capsule  Has the patient contacted their pharmacy? Yes Patient was advised to contact her provider for a refill  Is this the correct pharmacy for this prescription?  If no, delete pharmacy and type the correct one.  This is the patient's preferred pharmacy:  [facility] [address]  - [address] [place] [address] Phone: [phone] Fax: [phone]  Has the prescription been filled recently? No  Is the patient out of the medication? Yes  Has the patient been seen for an appointment in the last year OR does the patient have an upcoming appointment? Yes  Can we respond through MyChart? Yes  Agent: Please be advised that Rx refills may take up to 3 business days. We ask that you follow-up with your pharmacy.

## 2023-05-09 ENCOUNTER — Other Ambulatory Visit (INDEPENDENT_AMBULATORY_CARE_PROVIDER_SITE_OTHER): Payer: Self-pay | Admitting: Family Medicine

## 2023-05-09 DIAGNOSIS — E119 Type 2 diabetes mellitus without complications: Secondary | ICD-10-CM

## 2023-05-11 DIAGNOSIS — Z79899 Other long term (current) drug therapy: Secondary | ICD-10-CM | POA: Diagnosis not present

## 2023-05-12 ENCOUNTER — Other Ambulatory Visit: Payer: Self-pay

## 2023-05-12 MED ORDER — OMEPRAZOLE 20 MG PO CPDR: 20.0000 mg | DELAYED_RELEASE_CAPSULE | Freq: Every day | ORAL | 2 refills | Status: DC

## 2023-05-12 NOTE — Telephone Encounter (Signed)
 Patient last seen 01/09/23, I called the patient to scheduled a follow up appointment she stated that she is at work and will call back to schedule a appointment.

## 2023-05-18 ENCOUNTER — Ambulatory Visit (INDEPENDENT_AMBULATORY_CARE_PROVIDER_SITE_OTHER): Admitting: Family Medicine

## 2023-05-18 ENCOUNTER — Encounter (INDEPENDENT_AMBULATORY_CARE_PROVIDER_SITE_OTHER): Payer: Self-pay | Admitting: Family Medicine

## 2023-05-18 VITALS — BP 120/66 | HR 95 | Temp 98.4°F | Ht 66.0 in | Wt 167.0 lb

## 2023-05-18 DIAGNOSIS — E669 Obesity, unspecified: Secondary | ICD-10-CM

## 2023-05-18 DIAGNOSIS — E119 Type 2 diabetes mellitus without complications: Secondary | ICD-10-CM | POA: Diagnosis not present

## 2023-05-18 DIAGNOSIS — Z6826 Body mass index (BMI) 26.0-26.9, adult: Secondary | ICD-10-CM

## 2023-05-18 DIAGNOSIS — Z638 Other specified problems related to primary support group: Secondary | ICD-10-CM | POA: Diagnosis not present

## 2023-05-18 DIAGNOSIS — Z6831 Body mass index (BMI) 31.0-31.9, adult: Secondary | ICD-10-CM

## 2023-05-18 DIAGNOSIS — Z7985 Long-term (current) use of injectable non-insulin antidiabetic drugs: Secondary | ICD-10-CM

## 2023-05-18 MED ORDER — OZEMPIC (0.25 OR 0.5 MG/DOSE) 2 MG/3ML ~~LOC~~ SOPN: 0.2500 mg | PEN_INJECTOR | SUBCUTANEOUS | 0 refills | Status: DC

## 2023-05-18 NOTE — Progress Notes (Signed)
 SUBJECTIVE:  Chief Complaint: Obesity  Interim History: Patient has been trying to stay more consistent with her food intake.  She mentions she is nibbling and eating at least once day.  Work has been hit or miss.  Financially she is more strapped as when it rains she does not work.  She has been watching basketball or clean her house to keep busy.  Food example is one boiled egg, one strip of bacon and half a piece of toast. She is cooking on Sunday.  She mentions that if she eats out she wastes more than she eats. She does have quite a few doctors appointments coming up.  Hasn't heard anything about court for her grandson's murder yet.  Kristen Moreno is here to discuss her progress with her obesity treatment plan. She is on the practicing portion control and making smarter food choices, such as increasing vegetables and decreasing simple carbohydrates and states she is following her eating plan approximately 90 % of the time. She states she is walking 10.5 hours 4 times per week at work..   OBJECTIVE: Visit Diagnoses: Problem List Items Addressed This Visit       Endocrine   DM (diabetes mellitus) (HCC) - Primary   Patient is on Ozempic  0.25mg  weekly.  She needs a refill today.  Due to insufficient food intake will not increase her dose.        Relevant Medications   Semaglutide ,0.25 or 0.5MG /DOS, (OZEMPIC , 0.25 OR 0.5 MG/DOSE,) 2 MG/3ML SOPN     Other   Stress due to family tension   Patient dosing reasonably well on sertraline .  Does not need a refill today.  Will follow up at next appointment on patient's mental health especially with trial looming.      Other Visit Diagnoses       Obesity with starting BMI of 31.9       Relevant Medications   Semaglutide ,0.25 or 0.5MG /DOS, (OZEMPIC , 0.25 OR 0.5 MG/DOSE,) 2 MG/3ML SOPN     BMI 26.0-26.9,adult           Vitals Temp: 98.4 F (36.9 C) BP: 120/66 Pulse Rate: 95 SpO2: 98 %   Anthropometric Measurements Height: 5\' 6"   (1.676 m) Weight: 167 lb (75.8 kg) BMI (Calculated): 26.97 Weight at Last Visit: 168 lb Weight Lost Since Last Visit: 1 Weight Gained Since Last Visit: 0 Starting Weight: 191 lb Total Weight Loss (lbs): 24 lb (10.9 kg)   Body Composition  Body Fat %: 38.5 % Fat Mass (lbs): 64.2 lbs Muscle Mass (lbs): 97.6 lbs Total Body Water  (lbs): 70.2 lbs Visceral Fat Rating : 9   Other Clinical Data Fasting: no Labs: no Today's Visit #: 13 Starting Date: 03/27/22 Comments: PC/[place]     ASSESSMENT AND PLAN:  Diet: Kristen Moreno is currently in the action stage of change. As such, her goal is to continue with weight loss efforts and has agreed to practicing portion control and making smarter food choices, such as increasing vegetables and decreasing simple carbohydrates.   Exercise:  For substantial health benefits, adults should do at least 150 minutes (2 hours and 30 minutes) a week of moderate-intensity, or 75 minutes (1 hour and 15 minutes) a week of vigorous-intensity aerobic physical activity, or an equivalent combination of moderate- and vigorous-intensity aerobic activity. Aerobic activity should be performed in episodes of at least 10 minutes, and preferably, it should be spread throughout the week.  Behavior Modification:  We discussed the following Behavioral Modification Strategies today: increasing lean  protein intake, decreasing simple carbohydrates, meal planning and cooking strategies, and planning for success.   Return in about 4 weeks (around 06/15/2023).Aaron Aas She was informed of the importance of frequent follow up visits to maximize her success with intensive lifestyle modifications for her multiple health conditions.  Attestation Statements:   Reviewed by clinician on day of visit: allergies, medications, problem list, medical history, surgical history, family history, social history, and previous encounter notes.    Donaciano Frizzle, MD

## 2023-05-18 NOTE — Assessment & Plan Note (Signed)
 Patient is on Ozempic  0.25mg  weekly.  She needs a refill today.  Due to insufficient food intake will not increase her dose.

## 2023-05-18 NOTE — Assessment & Plan Note (Signed)
 Patient dosing reasonably well on sertraline .  Does not need a refill today.  Will follow up at next appointment on patient's mental health especially with trial looming.

## 2023-05-20 ENCOUNTER — Ambulatory Visit (INDEPENDENT_AMBULATORY_CARE_PROVIDER_SITE_OTHER): Admitting: Family Medicine

## 2023-05-26 DIAGNOSIS — Z79899 Other long term (current) drug therapy: Secondary | ICD-10-CM | POA: Diagnosis not present

## 2023-05-26 DIAGNOSIS — Z940 Kidney transplant status: Secondary | ICD-10-CM | POA: Diagnosis not present

## 2023-06-01 ENCOUNTER — Other Ambulatory Visit: Payer: Self-pay | Admitting: Internal Medicine

## 2023-06-01 DIAGNOSIS — Z0000 Encounter for general adult medical examination without abnormal findings: Secondary | ICD-10-CM

## 2023-06-04 DIAGNOSIS — M25551 Pain in right hip: Secondary | ICD-10-CM | POA: Diagnosis not present

## 2023-06-04 DIAGNOSIS — M549 Dorsalgia, unspecified: Secondary | ICD-10-CM | POA: Diagnosis not present

## 2023-06-04 DIAGNOSIS — Z6828 Body mass index (BMI) 28.0-28.9, adult: Secondary | ICD-10-CM | POA: Diagnosis not present

## 2023-06-04 DIAGNOSIS — I10 Essential (primary) hypertension: Secondary | ICD-10-CM | POA: Diagnosis not present

## 2023-06-04 DIAGNOSIS — Z79899 Other long term (current) drug therapy: Secondary | ICD-10-CM | POA: Diagnosis not present

## 2023-06-04 DIAGNOSIS — M25512 Pain in left shoulder: Secondary | ICD-10-CM | POA: Diagnosis not present

## 2023-06-04 DIAGNOSIS — M25511 Pain in right shoulder: Secondary | ICD-10-CM | POA: Diagnosis not present

## 2023-06-04 DIAGNOSIS — G8929 Other chronic pain: Secondary | ICD-10-CM | POA: Diagnosis not present

## 2023-06-10 DIAGNOSIS — Z79899 Other long term (current) drug therapy: Secondary | ICD-10-CM | POA: Diagnosis not present

## 2023-06-19 ENCOUNTER — Encounter

## 2023-06-19 DIAGNOSIS — Z1231 Encounter for screening mammogram for malignant neoplasm of breast: Secondary | ICD-10-CM

## 2023-06-22 ENCOUNTER — Encounter (HOSPITAL_COMMUNITY): Payer: Self-pay

## 2023-06-23 ENCOUNTER — Encounter (HOSPITAL_COMMUNITY): Payer: Self-pay

## 2023-06-25 ENCOUNTER — Ambulatory Visit (INDEPENDENT_AMBULATORY_CARE_PROVIDER_SITE_OTHER): Admitting: Family Medicine

## 2023-06-30 ENCOUNTER — Ambulatory Visit: Payer: Self-pay

## 2023-06-30 ENCOUNTER — Encounter (HOSPITAL_COMMUNITY): Payer: Self-pay

## 2023-06-30 NOTE — Telephone Encounter (Signed)
 I called pt who c/o urinary frequency, pressure, and stomach pain. Informed pt she needs to come in to see the doctor before an abx can be prescribed. Pt unable to come in today. Next available appt will be Thursday. Then pt stated she will call her Kidney doctor who's aware of her frequent UTI's. But will call back if an appt needed.

## 2023-06-30 NOTE — Telephone Encounter (Signed)
 FYI Only or Action Required?: Action required by provider  Patient was last seen in primary care on 05/18/2023 by Jenean Minus, MD. Called Nurse Triage reporting Urinary Frequency. Symptoms began several days ago. Interventions attempted: Nothing. Symptoms are: unchanged.  Triage Disposition: See Physician Within 24 Hours  Patient/caregiver understands and will follow disposition?: No, wishes to speak with PCP  Copied from CRM [id number]. Topic: Clinical - Red Word Triage >> Jun 30, 2023  2:13 PM Brittney F wrote: Red Word that prompted transfer to Nurse Triage:   Possible UTI  Symptoms: Pressure and subtle pain when urinating; high frequency of urination ( every 15 minutes) Reason for Disposition  Urinating more frequently than usual (i.e., frequency)  Answer Assessment - Initial Assessment Questions 1. SYMPTOM: "What's the main symptom you're concerned about?" (e.g., frequency, incontinence)     Increased frequency, hurts when pee 2. ONSET: "When did the  s/s  start?"     3-4 days 3. PAIN: "Is there any pain?" If Yes, ask: "How bad is it?" (Scale: 1-10; mild, moderate, severe)     Just a little bit when I go, a little abd pain 4. CAUSE: "What do you think is causing the symptoms?"     UTI 5. OTHER SYMPTOMS: "Do you have any other symptoms?" (e.g., blood in urine, fever, flank pain, pain with urination)     Odor,  Protocols used: Urinary Symptoms-A-AH Pt requesting rx sent in, declined appt at this time.

## 2023-07-02 DIAGNOSIS — E663 Overweight: Secondary | ICD-10-CM | POA: Diagnosis not present

## 2023-07-02 DIAGNOSIS — M25512 Pain in left shoulder: Secondary | ICD-10-CM | POA: Diagnosis not present

## 2023-07-02 DIAGNOSIS — Z940 Kidney transplant status: Secondary | ICD-10-CM | POA: Diagnosis not present

## 2023-07-02 DIAGNOSIS — M25551 Pain in right hip: Secondary | ICD-10-CM | POA: Diagnosis not present

## 2023-07-02 DIAGNOSIS — E559 Vitamin D deficiency, unspecified: Secondary | ICD-10-CM | POA: Diagnosis not present

## 2023-07-02 DIAGNOSIS — G8929 Other chronic pain: Secondary | ICD-10-CM | POA: Diagnosis not present

## 2023-07-02 DIAGNOSIS — M25511 Pain in right shoulder: Secondary | ICD-10-CM | POA: Diagnosis not present

## 2023-07-02 DIAGNOSIS — Z79899 Other long term (current) drug therapy: Secondary | ICD-10-CM | POA: Diagnosis not present

## 2023-07-02 DIAGNOSIS — M549 Dorsalgia, unspecified: Secondary | ICD-10-CM | POA: Diagnosis not present

## 2023-07-02 DIAGNOSIS — E119 Type 2 diabetes mellitus without complications: Secondary | ICD-10-CM | POA: Diagnosis not present

## 2023-07-06 DIAGNOSIS — Z79899 Other long term (current) drug therapy: Secondary | ICD-10-CM | POA: Diagnosis not present

## 2023-07-07 ENCOUNTER — Encounter (INDEPENDENT_AMBULATORY_CARE_PROVIDER_SITE_OTHER): Payer: Self-pay

## 2023-07-07 ENCOUNTER — Encounter (INDEPENDENT_AMBULATORY_CARE_PROVIDER_SITE_OTHER): Payer: Self-pay | Admitting: Family Medicine

## 2023-07-07 ENCOUNTER — Ambulatory Visit (INDEPENDENT_AMBULATORY_CARE_PROVIDER_SITE_OTHER): Admitting: Family Medicine

## 2023-07-07 DIAGNOSIS — E669 Obesity, unspecified: Secondary | ICD-10-CM

## 2023-07-07 NOTE — Progress Notes (Deleted)
   SUBJECTIVE:  Chief Complaint: Obesity  Interim History: ***  Kristen Moreno is here to discuss her progress with her obesity treatment plan. She is on the practicing portion control and making smarter food choices, such as increasing vegetables and decreasing simple carbohydrates and states she {CHL AMB IS/IS NOT:210130109} following her eating plan approximately *** % of the time. She states she {CHL AMB IS/IS NOT:210130109} exercising *** minutes *** times per week.   OBJECTIVE: Visit Diagnoses: Problem List Items Addressed This Visit   None   No data recorded No data recorded No data recorded No data recorded   ASSESSMENT AND PLAN:  Diet: Kristen Moreno is currently in the action stage of change. As such, her goal is to continue with weight loss efforts and has agreed to {MWMwtlossportion/plan2:23431}.   Exercise:  {MWM Exercise Recommendations:210964029}  Behavior Modification:  We discussed the following Behavioral Modification Strategies today: {HWW Behavior Modification:210964008}. We discussed various medication options to help Kristen Moreno with her weight loss efforts and we both agreed to ***.  No follow-ups on file.   She was informed of the importance of frequent follow up visits to maximize her success with intensive lifestyle modifications for her multiple health conditions.  Attestation Statements:   Reviewed by clinician on day of visit: allergies, medications, problem list, medical history, surgical history, family history, social history, and previous encounter notes.   Time spent on visit including pre-visit chart review and post-visit care and charting was *** minutes  Donaciano Frizzle, MD

## 2023-07-10 ENCOUNTER — Other Ambulatory Visit (INDEPENDENT_AMBULATORY_CARE_PROVIDER_SITE_OTHER): Payer: Self-pay | Admitting: Family Medicine

## 2023-07-10 DIAGNOSIS — Z638 Other specified problems related to primary support group: Secondary | ICD-10-CM

## 2023-07-10 DIAGNOSIS — Z940 Kidney transplant status: Secondary | ICD-10-CM | POA: Diagnosis not present

## 2023-07-10 DIAGNOSIS — N390 Urinary tract infection, site not specified: Secondary | ICD-10-CM | POA: Diagnosis not present

## 2023-07-10 DIAGNOSIS — N2581 Secondary hyperparathyroidism of renal origin: Secondary | ICD-10-CM | POA: Diagnosis not present

## 2023-07-13 NOTE — Progress Notes (Signed)
 error

## 2023-07-20 ENCOUNTER — Ambulatory Visit (INDEPENDENT_AMBULATORY_CARE_PROVIDER_SITE_OTHER): Admitting: Adult Health

## 2023-07-20 ENCOUNTER — Encounter (INDEPENDENT_AMBULATORY_CARE_PROVIDER_SITE_OTHER): Payer: Self-pay | Admitting: Adult Health

## 2023-07-20 VITALS — BP 114/70 | HR 64 | Temp 98.7°F | Ht 66.0 in | Wt 170.0 lb

## 2023-07-20 DIAGNOSIS — E119 Type 2 diabetes mellitus without complications: Secondary | ICD-10-CM

## 2023-07-20 DIAGNOSIS — E559 Vitamin D deficiency, unspecified: Secondary | ICD-10-CM

## 2023-07-20 DIAGNOSIS — Z6831 Body mass index (BMI) 31.0-31.9, adult: Secondary | ICD-10-CM

## 2023-07-20 DIAGNOSIS — Z6827 Body mass index (BMI) 27.0-27.9, adult: Secondary | ICD-10-CM | POA: Diagnosis not present

## 2023-07-20 DIAGNOSIS — Z7985 Long-term (current) use of injectable non-insulin antidiabetic drugs: Secondary | ICD-10-CM

## 2023-07-20 DIAGNOSIS — E669 Obesity, unspecified: Secondary | ICD-10-CM | POA: Diagnosis not present

## 2023-07-20 DIAGNOSIS — F4321 Adjustment disorder with depressed mood: Secondary | ICD-10-CM

## 2023-07-20 MED ORDER — OZEMPIC (0.25 OR 0.5 MG/DOSE) 2 MG/3ML ~~LOC~~ SOPN: 0.5000 mg | PEN_INJECTOR | SUBCUTANEOUS | 1 refills | Status: DC

## 2023-07-20 NOTE — Progress Notes (Unsigned)
 WEIGHT SUMMARY AND BIOMETRICS  Vitals Temp: 98.7 F (37.1 C) BP: 114/70 Pulse Rate: 64 SpO2: 96 %   Anthropometric Measurements Height: 5' 6 (1.676 m) Weight: 170 lb (77.1 kg) BMI (Calculated): 27.45 Weight at Last Visit: 167lb Weight Lost Since Last Visit: 0lb Weight Gained Since Last Visit: 3lb Starting Weight: 191lb Total Weight Loss (lbs): 21 lb (9.526 kg)   Body Composition  Body Fat %: 39.3 % Fat Mass (lbs): 66.8 lbs Muscle Mass (lbs): 98.2 lbs Total Body Water  (lbs): 74.6 lbs Visceral Fat Rating : 9   Other Clinical Data Fasting: No Labs: No Today's Visit #: 14 Starting Date: 03/27/22    Chief Complaint:   OBESITY Kristen Moreno is here to discuss her progress with her obesity treatment plan. She is on the {MWMwtlossportion/plan2:23431} and states she is following her eating plan approximately *** % of the time. She states she is exercising *** minutes *** times per week.   Interim History: *** Hunger/appetite-*** Cravings- *** Stress- *** Sleep- *** Exercise-*** Hydration-***  Subjective:   1. Type 2 diabetes mellitus without complication, without long-term current use of insulin  (HCC) ***  2. Vitamin D  deficiency ***  3. Grief ***  4. BMI 27.0-27.9,adult, Current BMI 27.5 ***  5. Obesity with starting BMI of 31.9 ***   Assessment/Plan:   1. Type 2 diabetes mellitus without complication, without long-term current use of insulin  (HCC) Refill and INCREASE - Semaglutide ,0.25 or 0.5MG /DOS, (OZEMPIC , 0.25 OR 0.5 MG/DOSE,) 2 MG/3ML SOPN; Inject 0.5 mg into the skin once a week.  Dispense: 3 mL; Refill: 1  2. Vitamin D  deficiency ***  3. Grief ***  4. BMI 27.0-27.9,adult, Current BMI 27.5 ***  5. Obesity with starting BMI of 31.9 (Primary) ***  Kristen Moreno {CHL AMB IS/IS NOT:210130109} currently in the action stage of change. As such, her goal is to {MWMwtloss#1:210800005}. She has agreed to {MWMwtlossportion/plan2:23431}.    Exercise goals: {MWM EXERCISE RECS:23473}  Behavioral modification strategies: {MWMwtlossdietstrategies3:23432}.  Kristen Moreno has agreed to follow-up with our clinic in {NUMBER 1-10:22536} weeks. She was informed of the importance of frequent follow-up visits to maximize her success with intensive lifestyle modifications for her multiple health conditions.   Check Fasting Labs at next OV  Objective:   Blood pressure 114/70, pulse 64, temperature 98.7 F (37.1 C), height 5' 6 (1.676 m), weight 170 lb (77.1 kg), SpO2 96%. Body mass index is 27.44 kg/m.  General: Cooperative, alert, well developed, in no acute distress. HEENT: Conjunctivae and lids unremarkable. Cardiovascular: Regular rhythm.  Lungs: Normal work of breathing. Neurologic: No focal deficits.   Lab Results  Component Value Date   CREATININE 0.97 10/03/2022   BUN 20 10/03/2022   NA 137 10/03/2022   K 4.4 10/03/2022   CL 113 (H) 10/03/2022   CO2 16 (L) 10/03/2022   Lab Results  Component Value Date   ALT 17 10/01/2022   AST 17 10/01/2022   ALKPHOS 57 10/01/2022   BILITOT 0.5 10/01/2022   Lab Results  Component Value Date   HGBA1C 6.3 (A) 01/09/2023   HGBA1C 6.1 (A) 07/10/2022   HGBA1C 7.4 (H) 03/27/2022   HGBA1C 6.8 (A) 07/31/2021   HGBA1C 7.6 (A) 01/23/2021   Lab Results  Component Value Date   INSULIN  10.7 03/27/2022   Lab Results  Component Value Date   TSH 0.830 03/27/2022   Lab Results  Component Value Date   CHOL 163 03/27/2022   HDL 55 03/27/2022   LDLCALC 87 03/27/2022  TRIG 121 03/27/2022   CHOLHDL 5.1 (H) 07/03/2015   Lab Results  Component Value Date   VD25OH 21.9 (L) 03/27/2022   VD25OH 13.1 (L) 04/01/2017   VD25OH 33 06/25/2009   Lab Results  Component Value Date   WBC 7.2 10/03/2022   HGB 11.3 (L) 10/03/2022   HCT 36.1 10/03/2022   MCV 95.3 10/03/2022   PLT 246 10/03/2022   Lab Results  Component Value Date   IRON 45 12/02/2017   TIBC 262 12/02/2017   FERRITIN  1,202 (H) 01/10/2021   Attestation Statements:   Reviewed by clinician on day of visit: allergies, medications, problem list, medical history, surgical history, family history, social history, and previous encounter notes.  I have reviewed the above documentation for accuracy and completeness, and I agree with the above. -  Loranzo Desha d. Nathaniel Wakeley, NP-C

## 2023-07-31 DIAGNOSIS — Z79899 Other long term (current) drug therapy: Secondary | ICD-10-CM | POA: Diagnosis not present

## 2023-07-31 DIAGNOSIS — E663 Overweight: Secondary | ICD-10-CM | POA: Diagnosis not present

## 2023-07-31 DIAGNOSIS — M25551 Pain in right hip: Secondary | ICD-10-CM | POA: Diagnosis not present

## 2023-07-31 DIAGNOSIS — M549 Dorsalgia, unspecified: Secondary | ICD-10-CM | POA: Diagnosis not present

## 2023-07-31 DIAGNOSIS — M25511 Pain in right shoulder: Secondary | ICD-10-CM | POA: Diagnosis not present

## 2023-07-31 DIAGNOSIS — E559 Vitamin D deficiency, unspecified: Secondary | ICD-10-CM | POA: Diagnosis not present

## 2023-07-31 DIAGNOSIS — Z940 Kidney transplant status: Secondary | ICD-10-CM | POA: Diagnosis not present

## 2023-07-31 DIAGNOSIS — E119 Type 2 diabetes mellitus without complications: Secondary | ICD-10-CM | POA: Diagnosis not present

## 2023-07-31 DIAGNOSIS — M25512 Pain in left shoulder: Secondary | ICD-10-CM | POA: Diagnosis not present

## 2023-08-03 ENCOUNTER — Ambulatory Visit

## 2023-08-04 ENCOUNTER — Other Ambulatory Visit: Payer: Self-pay | Admitting: Student

## 2023-08-04 ENCOUNTER — Other Ambulatory Visit: Payer: Self-pay | Admitting: Internal Medicine

## 2023-08-04 DIAGNOSIS — Z1231 Encounter for screening mammogram for malignant neoplasm of breast: Secondary | ICD-10-CM

## 2023-08-05 DIAGNOSIS — Z79899 Other long term (current) drug therapy: Secondary | ICD-10-CM | POA: Diagnosis not present

## 2023-08-05 DIAGNOSIS — D631 Anemia in chronic kidney disease: Secondary | ICD-10-CM | POA: Diagnosis not present

## 2023-08-05 DIAGNOSIS — N2581 Secondary hyperparathyroidism of renal origin: Secondary | ICD-10-CM | POA: Diagnosis not present

## 2023-08-05 DIAGNOSIS — I129 Hypertensive chronic kidney disease with stage 1 through stage 4 chronic kidney disease, or unspecified chronic kidney disease: Secondary | ICD-10-CM | POA: Diagnosis not present

## 2023-08-05 DIAGNOSIS — Z940 Kidney transplant status: Secondary | ICD-10-CM | POA: Diagnosis not present

## 2023-08-05 DIAGNOSIS — N1831 Chronic kidney disease, stage 3a: Secondary | ICD-10-CM | POA: Diagnosis not present

## 2023-08-05 DIAGNOSIS — N189 Chronic kidney disease, unspecified: Secondary | ICD-10-CM | POA: Diagnosis not present

## 2023-08-11 ENCOUNTER — Ambulatory Visit
Admission: RE | Admit: 2023-08-11 | Discharge: 2023-08-11 | Disposition: A | Discharge status: Home / Self Care | Source: Ambulatory Visit

## 2023-08-11 DIAGNOSIS — Z1231 Encounter for screening mammogram for malignant neoplasm of breast: Secondary | ICD-10-CM

## 2023-08-17 ENCOUNTER — Encounter (INDEPENDENT_AMBULATORY_CARE_PROVIDER_SITE_OTHER): Payer: Self-pay | Admitting: Adult Health

## 2023-08-18 ENCOUNTER — Ambulatory Visit (INDEPENDENT_AMBULATORY_CARE_PROVIDER_SITE_OTHER): Admitting: Adult Health

## 2023-08-20 ENCOUNTER — Encounter (INDEPENDENT_AMBULATORY_CARE_PROVIDER_SITE_OTHER): Payer: Self-pay | Admitting: Adult Health

## 2023-08-20 ENCOUNTER — Ambulatory Visit (INDEPENDENT_AMBULATORY_CARE_PROVIDER_SITE_OTHER): Admitting: Adult Health

## 2023-08-20 VITALS — BP 115/66 | HR 67 | Temp 98.2°F | Ht 66.0 in | Wt 166.0 lb

## 2023-08-20 DIAGNOSIS — E1169 Type 2 diabetes mellitus with other specified complication: Secondary | ICD-10-CM | POA: Diagnosis not present

## 2023-08-20 DIAGNOSIS — E669 Obesity, unspecified: Secondary | ICD-10-CM | POA: Diagnosis not present

## 2023-08-20 DIAGNOSIS — E559 Vitamin D deficiency, unspecified: Secondary | ICD-10-CM | POA: Diagnosis not present

## 2023-08-20 DIAGNOSIS — Z7985 Long-term (current) use of injectable non-insulin antidiabetic drugs: Secondary | ICD-10-CM

## 2023-08-20 DIAGNOSIS — E119 Type 2 diabetes mellitus without complications: Secondary | ICD-10-CM

## 2023-08-20 DIAGNOSIS — E785 Hyperlipidemia, unspecified: Secondary | ICD-10-CM | POA: Diagnosis not present

## 2023-08-20 DIAGNOSIS — E1159 Type 2 diabetes mellitus with other circulatory complications: Secondary | ICD-10-CM

## 2023-08-20 DIAGNOSIS — I152 Hypertension secondary to endocrine disorders: Secondary | ICD-10-CM | POA: Diagnosis not present

## 2023-08-20 DIAGNOSIS — Z6826 Body mass index (BMI) 26.0-26.9, adult: Secondary | ICD-10-CM | POA: Diagnosis not present

## 2023-08-20 DIAGNOSIS — Z6831 Body mass index (BMI) 31.0-31.9, adult: Secondary | ICD-10-CM

## 2023-08-20 MED ORDER — OZEMPIC (0.25 OR 0.5 MG/DOSE) 2 MG/3ML ~~LOC~~ SOPN
0.5000 mg | PEN_INJECTOR | SUBCUTANEOUS | 1 refills | Status: DC
Start: 2023-08-20 — End: 2023-11-24

## 2023-08-20 NOTE — Progress Notes (Signed)
 WEIGHT SUMMARY AND BIOMETRICS  Vitals Temp: 98.2 F (36.8 C) BP: 115/66 Pulse Rate: 67 SpO2: 99 %   Anthropometric Measurements Height: 5' 6 (1.676 m) Weight: 166 lb (75.3 kg) BMI (Calculated): 26.81 Weight at Last Visit: 170 lb Weight Lost Since Last Visit: 4 lb Weight Gained Since Last Visit: 0 Starting Weight: 191 lb Total Weight Loss (lbs): 25 lb (11.3 kg)   Body Composition  Body Fat %: 38.8 % Fat Mass (lbs): 64.6 lbs Muscle Mass (lbs): 96.6 lbs Total Body Water  (lbs): 74.2 lbs Visceral Fat Rating : 9   Other Clinical Data Fasting: yes Labs: yes Today's Visit #: 15 Starting Date: 03/27/22    Chief Complaint:   OBESITY Kristen Moreno is here to discuss her progress with her obesity treatment plan.  She is on the practicing portion control and making smarter food choices, such as increasing vegetables and decreasing simple carbohydrates and states she is following her eating plan approximately 50 % of the time.  She states she is exercising Walking at Work 5 times per week.  Interim History:  Started on Ozempic  0.25mg  since on/about 04/10/2022  07/20/2023 Ozempic  0.25mg  increased to 0.5mg  Denies mass in neck, dysphagia, dyspepsia, persistent hoarseness, abdominal pain, or N/V/C   Her Visceral Rating Remains stable and at goal 9  Subjective:   1. Hyperlipidemia associated with type 2 diabetes mellitus (HCC) Lipid Panel     Component Value Date/Time   CHOL 163 03/27/2022 0930   TRIG 121 03/27/2022 0930   HDL 55 03/27/2022 0930   CHOLHDL 5.1 (H) 07/03/2015 0956   VLDL 34 (H) 07/03/2015 0956   LDLCALC 87 03/27/2022 0930   LABVLDL 21 03/27/2022 0930     2. Type 2 diabetes mellitus without complication, without long-term current use of insulin  (HCC) Lab Results  Component Value Date   HGBA1C 6.3 (A) 01/09/2023   HGBA1C 6.1 (A) 07/10/2022   HGBA1C 7.4 (H) 03/27/2022    Started on Ozempic  0.25mg  since on/about 04/10/2022  07/20/2023 Ozempic  0.25mg   increased to 0.5mg  She denies sx's of hypoglycemia Denies mass in neck, dysphagia, dyspepsia, persistent hoarseness, abdominal pain, or N/V/C   3. Vitamin D  deficiency  Latest Reference Range & Units 03/27/22 09:30  Vitamin D , 25-Hydroxy 30.0 - 100.0 ng/mL 21.9 (L)  (L): Data is abnormally low  Vit D level well below goal of 50-70  4. Hypertension associated with diabetes (HCC) BP at goal at OV She is currently on amLODipine  (NORVASC ) 10 MG tablet  atorvastatin  (LIPITOR) 40 MG tablet   Assessment/Plan:   1. Hyperlipidemia associated with type 2 diabetes mellitus (HCC) Check Labs - Lipid panel  2. Type 2 diabetes mellitus without complication, without long-term current use of insulin  (HCC) (Primary) Refill - Semaglutide ,0.25 or 0.5MG /DOS, (OZEMPIC , 0.25 OR 0.5 MG/DOSE,) 2 MG/3ML SOPN; Inject 0.5 mg into the skin once a week.  Dispense: 3 mL; Refill: 1 Check Labs - Hemoglobin A1c - Insulin , random - Vitamin B12  3. Vitamin D  deficiency Check Labs - VITAMIN D  25 Hydroxy (Vit-D Deficiency, Fractures)  4. Hypertension associated with diabetes (HCC) Check Labs - Comprehensive metabolic panel with GFR Continue amLODipine  (NORVASC ) 10 MG tablet  atorvastatin  (LIPITOR) 40 MG tablet   5. BMI 26.0-26.9,adult  Mahli is currently in the action stage of change. As such, her goal is to continue with weight loss efforts. She has agreed to practicing portion control and making smarter food choices, such as increasing vegetables and decreasing simple carbohydrates.   Exercise  goals: All adults should avoid inactivity. Some physical activity is better than none, and adults who participate in any amount of physical activity gain some health benefits. Adults should also include muscle-strengthening activities that involve all major muscle groups on 2 or more days a week.  Behavioral modification strategies: increasing lean protein intake, decreasing simple carbohydrates, increasing  vegetables, increasing water  intake, meal planning and cooking strategies, keeping healthy foods in the home, ways to avoid boredom eating, ways to avoid night time snacking, and planning for success.  Geraldyn has agreed to follow-up with our clinic in 4 weeks. She was informed of the importance of frequent follow-up visits to maximize her success with intensive lifestyle modifications for her multiple health conditions.   Shakenya was informed we would discuss her lab results at her next visit unless there is a critical issue that needs to be addressed sooner. Reizy agreed to keep her next visit at the agreed upon time to discuss these results.  Objective:   Blood pressure 115/66, pulse 67, temperature 98.2 F (36.8 C), height 5' 6 (1.676 m), weight 166 lb (75.3 kg), SpO2 99%. Body mass index is 26.79 kg/m.  General: Cooperative, alert, well developed, in no acute distress. HEENT: Conjunctivae and lids unremarkable. Cardiovascular: Regular rhythm.  Lungs: Normal work of breathing. Neurologic: No focal deficits.   Lab Results  Component Value Date   CREATININE 0.97 10/03/2022   BUN 20 10/03/2022   NA 137 10/03/2022   K 4.4 10/03/2022   CL 113 (H) 10/03/2022   CO2 16 (L) 10/03/2022   Lab Results  Component Value Date   ALT 17 10/01/2022   AST 17 10/01/2022   ALKPHOS 57 10/01/2022   BILITOT 0.5 10/01/2022   Lab Results  Component Value Date   HGBA1C 6.3 (A) 01/09/2023   HGBA1C 6.1 (A) 07/10/2022   HGBA1C 7.4 (H) 03/27/2022   HGBA1C 6.8 (A) 07/31/2021   HGBA1C 7.6 (A) 01/23/2021   Lab Results  Component Value Date   INSULIN  10.7 03/27/2022   Lab Results  Component Value Date   TSH 0.830 03/27/2022   Lab Results  Component Value Date   CHOL 163 03/27/2022   HDL 55 03/27/2022   LDLCALC 87 03/27/2022   TRIG 121 03/27/2022   CHOLHDL 5.1 (H) 07/03/2015   Lab Results  Component Value Date   VD25OH 21.9 (L) 03/27/2022   VD25OH 13.1 (L) 04/01/2017   VD25OH  33 06/25/2009   Lab Results  Component Value Date   WBC 7.2 10/03/2022   HGB 11.3 (L) 10/03/2022   HCT 36.1 10/03/2022   MCV 95.3 10/03/2022   PLT 246 10/03/2022   Lab Results  Component Value Date   IRON 45 12/02/2017   TIBC 262 12/02/2017   FERRITIN 1,202 (H) 01/10/2021   Attestation Statements:   Reviewed by clinician on day of visit: allergies, medications, problem list, medical history, surgical history, family history, social history, and previous encounter notes.  I have reviewed the above documentation for accuracy and completeness, and I agree with the above. -  Naol Ontiveros d. Marcelo Ickes, NP-C

## 2023-08-24 LAB — LIPID PANEL
Chol/HDL Ratio: 4.8 ratio — ABNORMAL HIGH (ref 0.0–4.4)
Cholesterol, Total: 266 mg/dL — ABNORMAL HIGH (ref 100–199)
HDL: 56 mg/dL (ref >39)
LDL Chol Calc (NIH): 181 mg/dL — ABNORMAL HIGH (ref 0–99)
Triglycerides: 158 mg/dL — ABNORMAL HIGH (ref 0–149)
VLDL Cholesterol Cal: 29 mg/dL (ref 5–40)

## 2023-08-24 LAB — COMPREHENSIVE METABOLIC PANEL WITH GFR
ALT: 11 IU/L (ref 0–32)
AST: 11 IU/L (ref 0–40)
Albumin: 3.9 g/dL (ref 3.9–4.9)
Alkaline Phosphatase: 64 IU/L (ref 44–121)
BUN/Creatinine Ratio: 15 (ref 12–28)
BUN: 16 mg/dL (ref 8–27)
Bilirubin Total: 0.3 mg/dL (ref 0.0–1.2)
CO2: 20 mmol/L (ref 20–29)
Calcium: 9.4 mg/dL (ref 8.7–10.3)
Chloride: 102 mmol/L (ref 96–106)
Creatinine, Ser: 1.06 mg/dL — ABNORMAL HIGH (ref 0.57–1.00)
Globulin, Total: 3.1 g/dL (ref 1.5–4.5)
Glucose: 88 mg/dL (ref 70–99)
Potassium: 3.7 mmol/L (ref 3.5–5.2)
Sodium: 142 mmol/L (ref 134–144)
Total Protein: 7 g/dL (ref 6.0–8.5)
eGFR: 60 mL/min/1.73 (ref >59)

## 2023-08-24 LAB — VITAMIN D 25 HYDROXY (VIT D DEFICIENCY, FRACTURES): Vit D, 25-Hydroxy: 44.7 ng/mL (ref 30.0–100.0)

## 2023-08-24 LAB — INSULIN, RANDOM: INSULIN: 7.2 u[IU]/mL (ref 2.6–24.9)

## 2023-08-24 LAB — HEMOGLOBIN A1C
Est. average glucose Bld gHb Est-mCnc: 134 mg/dL
Hgb A1c MFr Bld: 6.3 % — ABNORMAL HIGH (ref 4.8–5.6)

## 2023-08-24 LAB — VITAMIN B12: Vitamin B-12: 476 pg/mL (ref 232–1245)

## 2023-08-28 DIAGNOSIS — E119 Type 2 diabetes mellitus without complications: Secondary | ICD-10-CM | POA: Diagnosis not present

## 2023-08-28 DIAGNOSIS — M25511 Pain in right shoulder: Secondary | ICD-10-CM | POA: Diagnosis not present

## 2023-08-28 DIAGNOSIS — E663 Overweight: Secondary | ICD-10-CM | POA: Diagnosis not present

## 2023-08-28 DIAGNOSIS — Z940 Kidney transplant status: Secondary | ICD-10-CM | POA: Diagnosis not present

## 2023-08-28 DIAGNOSIS — M549 Dorsalgia, unspecified: Secondary | ICD-10-CM | POA: Diagnosis not present

## 2023-08-28 DIAGNOSIS — M25512 Pain in left shoulder: Secondary | ICD-10-CM | POA: Diagnosis not present

## 2023-08-28 DIAGNOSIS — M25551 Pain in right hip: Secondary | ICD-10-CM | POA: Diagnosis not present

## 2023-08-28 DIAGNOSIS — E559 Vitamin D deficiency, unspecified: Secondary | ICD-10-CM | POA: Diagnosis not present

## 2023-08-28 DIAGNOSIS — Z79899 Other long term (current) drug therapy: Secondary | ICD-10-CM | POA: Diagnosis not present

## 2023-08-28 DIAGNOSIS — G8929 Other chronic pain: Secondary | ICD-10-CM | POA: Diagnosis not present

## 2023-09-02 DIAGNOSIS — Z79899 Other long term (current) drug therapy: Secondary | ICD-10-CM | POA: Diagnosis not present

## 2023-09-03 ENCOUNTER — Ambulatory Visit: Payer: Self-pay | Admitting: Student

## 2023-09-03 VITALS — BP 138/71 | HR 86 | Temp 98.1°F | Ht 67.0 in | Wt 170.2 lb

## 2023-09-03 DIAGNOSIS — E089 Diabetes mellitus due to underlying condition without complications: Secondary | ICD-10-CM

## 2023-09-03 DIAGNOSIS — I120 Hypertensive chronic kidney disease with stage 5 chronic kidney disease or end stage renal disease: Secondary | ICD-10-CM | POA: Diagnosis not present

## 2023-09-03 DIAGNOSIS — N186 End stage renal disease: Secondary | ICD-10-CM

## 2023-09-03 DIAGNOSIS — K2270 Barrett's esophagus without dysplasia: Secondary | ICD-10-CM | POA: Diagnosis not present

## 2023-09-03 DIAGNOSIS — I10 Essential (primary) hypertension: Secondary | ICD-10-CM

## 2023-09-03 DIAGNOSIS — Z7985 Long-term (current) use of injectable non-insulin antidiabetic drugs: Secondary | ICD-10-CM

## 2023-09-03 DIAGNOSIS — E0822 Diabetes mellitus due to underlying condition with diabetic chronic kidney disease: Secondary | ICD-10-CM

## 2023-09-03 NOTE — Patient Instructions (Signed)
 Thank you, Ms.Ellanora E  for allowing us  to provide your care today.   Today we discussed :  Continue to take amlodipine  10 mg for hypertension.  Your blood pressure is well-controlled.   2.  Continue healthy weight management, and exercise.  Continue Ozempic  0.5 mg weekly injection.  3.  I have placed a referral for you to see gastroenterology for EGD repeat.  I have ordered the following labs for you:  Lab Orders  No laboratory test(s) ordered today     Tests ordered today:  None.  Referrals ordered today:    Referral Orders         Ambulatory referral to Gastroenterology      I have ordered the following medication/changed the following medications:   Stop the following medications: Medications Discontinued During This Encounter  Medication Reason   hydrocortisone  2.5 % lotion      Start the following medications: No orders of the defined types were placed in this encounter.    Follow up: 4-6 months    Should you have any questions or concerns please call the internal medicine clinic at [phone].    Missy Sandhoff, MD  [facility]

## 2023-09-03 NOTE — Assessment & Plan Note (Addendum)
 Diet-controlled type 2 diabetes.  Last A1c 2 weeks ago was 6.3.  She is on Ozempic  0.5 mg weekly injection. -A1c in 3 months. - Continue follow-up with healthy weight and wellness.

## 2023-09-03 NOTE — Assessment & Plan Note (Signed)
 Last saw [facility] in 2017, EGD showed Barrett's esophagus without dysplasia. She takes omeprazole  for acid reflux.  No new symptoms. - Referral to GI for EGD placed.

## 2023-09-03 NOTE — Assessment & Plan Note (Addendum)
 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 Follows with nephrology (Dr. Tobie). On tacrolimus  1 mg.  Plan: Continue nephrology follow-up.
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
## 2023-09-03 NOTE — Assessment & Plan Note (Addendum)
 BP stable.  She is on amlodipine  10 mg.  BMP 2 months ago was normal. -Follow-up in 3 months -Check BMP next office visit.

## 2023-09-16 ENCOUNTER — Ambulatory Visit (INDEPENDENT_AMBULATORY_CARE_PROVIDER_SITE_OTHER): Admitting: Family Medicine

## 2023-09-16 ENCOUNTER — Encounter (INDEPENDENT_AMBULATORY_CARE_PROVIDER_SITE_OTHER): Payer: Self-pay | Admitting: Family Medicine

## 2023-09-16 VITALS — BP 158/72 | HR 90 | Temp 98.0°F | Ht 66.0 in | Wt 165.0 lb

## 2023-09-16 DIAGNOSIS — E785 Hyperlipidemia, unspecified: Secondary | ICD-10-CM

## 2023-09-16 DIAGNOSIS — Z7984 Long term (current) use of oral hypoglycemic drugs: Secondary | ICD-10-CM | POA: Diagnosis not present

## 2023-09-16 DIAGNOSIS — N390 Urinary tract infection, site not specified: Secondary | ICD-10-CM

## 2023-09-16 DIAGNOSIS — Z7985 Long-term (current) use of injectable non-insulin antidiabetic drugs: Secondary | ICD-10-CM | POA: Diagnosis not present

## 2023-09-16 DIAGNOSIS — E1169 Type 2 diabetes mellitus with other specified complication: Secondary | ICD-10-CM | POA: Diagnosis not present

## 2023-09-16 DIAGNOSIS — Z6826 Body mass index (BMI) 26.0-26.9, adult: Secondary | ICD-10-CM | POA: Diagnosis not present

## 2023-09-16 DIAGNOSIS — Z638 Other specified problems related to primary support group: Secondary | ICD-10-CM | POA: Diagnosis not present

## 2023-09-16 DIAGNOSIS — E669 Obesity, unspecified: Secondary | ICD-10-CM | POA: Diagnosis not present

## 2023-09-16 DIAGNOSIS — E66811 Obesity, class 1: Secondary | ICD-10-CM

## 2023-09-16 MED ORDER — ATORVASTATIN CALCIUM 40 MG PO TABS: 40.0000 mg | ORAL_TABLET | Freq: Every day | ORAL | 3 refills | Status: DC

## 2023-09-16 MED ORDER — SERTRALINE HCL 25 MG PO TABS: 25.0000 mg | ORAL_TABLET | Freq: Every day | ORAL | 0 refills | Status: DC
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
## 2023-09-17 NOTE — Progress Notes (Signed)
 Internal Medicine Clinic Attending  Case discussed with the resident at the time of the visit.  We reviewed the resident's history and exam and pertinent patient test results.  I agree with the assessment, diagnosis, and plan of care documented in the resident's note.

## 2023-09-23 NOTE — Assessment & Plan Note (Signed)
 Patient has had significant familial stress over the last 8 months.  She has tolerated Zoloft  with no suicidal or homicidal ideations.  Needs a refill of Zoloft  today.  Reports that current dosage is sufficient in managing her symptoms.

## 2023-09-24 ENCOUNTER — Other Ambulatory Visit: Payer: Self-pay | Admitting: Student

## 2023-09-24 DIAGNOSIS — I10 Essential (primary) hypertension: Secondary | ICD-10-CM

## 2023-09-24 NOTE — Telephone Encounter (Signed)
 Medication sent to pharmacy

## 2023-09-29 DIAGNOSIS — M25551 Pain in right hip: Secondary | ICD-10-CM | POA: Diagnosis not present

## 2023-09-29 DIAGNOSIS — Z940 Kidney transplant status: Secondary | ICD-10-CM | POA: Diagnosis not present

## 2023-09-29 DIAGNOSIS — E663 Overweight: Secondary | ICD-10-CM | POA: Diagnosis not present

## 2023-09-29 DIAGNOSIS — M549 Dorsalgia, unspecified: Secondary | ICD-10-CM | POA: Diagnosis not present

## 2023-09-29 DIAGNOSIS — G8929 Other chronic pain: Secondary | ICD-10-CM | POA: Diagnosis not present

## 2023-09-29 DIAGNOSIS — R9431 Abnormal electrocardiogram [ECG] [EKG]: Secondary | ICD-10-CM | POA: Diagnosis not present

## 2023-09-29 DIAGNOSIS — M25511 Pain in right shoulder: Secondary | ICD-10-CM | POA: Diagnosis not present

## 2023-09-29 DIAGNOSIS — E119 Type 2 diabetes mellitus without complications: Secondary | ICD-10-CM | POA: Diagnosis not present

## 2023-09-29 DIAGNOSIS — E559 Vitamin D deficiency, unspecified: Secondary | ICD-10-CM | POA: Diagnosis not present

## 2023-09-29 DIAGNOSIS — Z79899 Other long term (current) drug therapy: Secondary | ICD-10-CM | POA: Diagnosis not present

## 2023-09-29 DIAGNOSIS — M25512 Pain in left shoulder: Secondary | ICD-10-CM | POA: Diagnosis not present

## 2023-10-01 DIAGNOSIS — Z79899 Other long term (current) drug therapy: Secondary | ICD-10-CM | POA: Diagnosis not present

## 2023-10-12 ENCOUNTER — Encounter (HOSPITAL_COMMUNITY): Payer: Self-pay

## 2023-10-13 ENCOUNTER — Telehealth: Payer: Self-pay

## 2023-10-13 ENCOUNTER — Ambulatory Visit (INDEPENDENT_AMBULATORY_CARE_PROVIDER_SITE_OTHER): Admitting: Family Medicine

## 2023-10-13 ENCOUNTER — Encounter (HOSPITAL_COMMUNITY): Payer: Self-pay

## 2023-10-13 ENCOUNTER — Encounter (INDEPENDENT_AMBULATORY_CARE_PROVIDER_SITE_OTHER): Payer: Self-pay | Admitting: Family Medicine

## 2023-10-13 VITALS — BP 121/64 | HR 95 | Temp 98.4°F | Ht 66.0 in | Wt 166.0 lb

## 2023-10-13 DIAGNOSIS — Z6826 Body mass index (BMI) 26.0-26.9, adult: Secondary | ICD-10-CM

## 2023-10-13 DIAGNOSIS — Z7985 Long-term (current) use of injectable non-insulin antidiabetic drugs: Secondary | ICD-10-CM

## 2023-10-13 DIAGNOSIS — K089 Disorder of teeth and supporting structures, unspecified: Secondary | ICD-10-CM | POA: Diagnosis not present

## 2023-10-13 DIAGNOSIS — E119 Type 2 diabetes mellitus without complications: Secondary | ICD-10-CM | POA: Diagnosis not present

## 2023-10-13 DIAGNOSIS — E669 Obesity, unspecified: Secondary | ICD-10-CM | POA: Diagnosis not present

## 2023-10-13 DIAGNOSIS — Z6831 Body mass index (BMI) 31.0-31.9, adult: Secondary | ICD-10-CM

## 2023-10-13 MED ORDER — UNJURY CHICKEN SOUP POWDER
2.0000 [oz_av] | Freq: Four times a day (QID) | ORAL | Status: AC
Start: 2023-10-13 — End: ?

## 2023-10-13 NOTE — Telephone Encounter (Addendum)
 COPD : RN Telephone Call   Patient ID: Kristen Moreno, female    DOB: [birth year]  Age: 63 y.o. MRN: [id number]  [name] is a 63 y.o. who was called about COPD management.   Patient reports that they are taking the following medications for COPD: ( TIOTROPIUM / ALBUTEROL  /PREDINSONE / DEXAMRTHASONE )   Patient reports they  DO NOT have a prior pulmonologist who they follow:   TREATED UNDER OUR CARE   Please document smoking status, how many packs? Ready to quit ?  - Smoking Hx: She currently smokes 3 packs per month. ( HAD STOP SMOKING BUT  STARTED BACK AFTER A DEATH OF A FAMILY MEMBER )    mMRC ([employer]) Dyspnea Scale   Ask the following questions and please check the box that describes the most.   Choose ONE>   []  Grade 0: I only get breathless with strenuous exercise   [x]  Grade 1: I get short of breath when hurry on level ground or walking up a slight hill   [x]  Grade 2: On level Ground, I walk slower than people of the same age because of breathlessness or have to stop for breath when walking my own pace   []  Grade 3: I Stop for breath after walking about 100 yards or after a few minutes on level ground   []  Grade 4: I am too breathless to leave the house, or I am breathless when dressing    CAT (COPD Assessment Test)   Please ask each of the following questions and select between 0-5 based on what describes the patient most accurately.  On a scale 0 to 5, do you never cough or cough all the time: 2  0 (I have no phlegm in my chest at all) 0 (My chest does not feel tight at all)  0 (When I walk up a hill or one flight of stairs I am not breathless)  1 0 (I am confident leaving my home despite my lung condition) 0 (I sleep soundly) On a scale 0 to 5, how would you describe your energy?: 2  Total: 5

## 2023-10-13 NOTE — Assessment & Plan Note (Signed)
 Patient is on Ozempic  weekly and mentions that she is tolerating her medication without issue.  She is feeling nauseous but was attributing this to her teeth.  We discussed importance of eating higher nutrition foods more frequently to get closer to her total calorie or nutrition goals.

## 2023-10-13 NOTE — Progress Notes (Signed)
   SUBJECTIVE:  Chief Complaint: Obesity  Interim History: Patient has been dealing with significant dental issues and is getting all of her top teeth pulled and most of the bottom teeth (except 6 pulled) on the 15th of next month.  She is really only tolerating soft foods.  She is trying to prep for court and helping her daughter who also has to get her teeth pulled and is starting chemo in December. She is eating things like eggs, mashed potatoes, inside of bread.  Kiaja is here to discuss her progress with her obesity treatment plan. She is on the practicing portion control and making smarter food choices, such as increasing vegetables and decreasing simple carbohydrates and states she is following her eating plan approximately 50 % of the time. She states she is not exercising.   OBJECTIVE: Visit Diagnoses: Problem List Items Addressed This Visit       Endocrine   DM (diabetes mellitus) (HCC) - Primary   Other Visit Diagnoses       Dental disease         Obesity with starting BMI of 31.9         BMI 26.0-26.9,adult           Vitals Temp: 98.4 F (36.9 C) BP: 121/64 Pulse Rate: 95 SpO2: 99 %   Anthropometric Measurements Height: 5' 6 (1.676 m) Weight: 166 lb (75.3 kg) BMI (Calculated): 26.81 Weight at Last Visit: 165 lb Weight Lost Since Last Visit: 0 Weight Gained Since Last Visit: 1 Starting Weight: 191 lb Total Weight Loss (lbs): 25 lb (11.3 kg)   Body Composition  Body Fat %: 39.4 % Fat Mass (lbs): 65.4 lbs Muscle Mass (lbs): 95.4 lbs Total Body Water  (lbs): 70.8 lbs Visceral Fat Rating : 9   Other Clinical Data Today's Visit #: 17 Starting Date: 03/27/22 Comments: PC/Magazine     ASSESSMENT AND PLAN: Assessment & Plan Type 2 diabetes mellitus without complication, without long-term current use of insulin  (HCC) Patient is on Ozempic  weekly and mentions that she is tolerating her medication without issue.  She is feeling nauseous but was  attributing this to her teeth.  We discussed importance of eating higher nutrition foods more frequently to get closer to her total calorie or nutrition goals.  Dental disease Patient voices that she needs numerous extractions for significant gum disease.  She is tolerating soft foods only.  Unjury samples given to patient today for protein supplementation as she awaits her dental work. Obesity with starting BMI of 31.9  BMI 26.0-26.9,adult    Diet: Akiya is currently in the action stage of change. As such, her goal is to continue with weight loss efforts and has agreed to practicing portion control and making smarter food choices, such as increasing vegetables and decreasing simple carbohydrates.   Exercise:  No exercise has been prescribed at this time.  Behavior Modification:  We discussed the following Behavioral Modification Strategies today: increasing lean protein intake, decreasing simple carbohydrates, increasing vegetables, and better snacking choices.   Return in about 6 weeks (around 11/24/2023).   She was informed of the importance of frequent follow up visits to maximize her success with intensive lifestyle modifications for her multiple health conditions.  Attestation Statements:   Reviewed by clinician on day of visit: allergies, medications, problem list, medical history, surgical history, family history, social history, and previous encounter notes.     Adelita Cho, MD

## 2023-10-13 NOTE — Telephone Encounter (Signed)
 Called to ask pt a few questions  .Kristen Moreno Please forward call to me thanks in advance

## 2023-10-17 ENCOUNTER — Ambulatory Visit
Admission: EM | Admit: 2023-10-17 | Discharge: 2023-10-17 | Disposition: A | Discharge status: Home / Self Care | Attending: Nurse Practitioner | Admitting: Nurse Practitioner

## 2023-10-17 ENCOUNTER — Encounter: Payer: Self-pay | Admitting: Emergency Medicine

## 2023-10-17 DIAGNOSIS — R112 Nausea with vomiting, unspecified: Secondary | ICD-10-CM | POA: Diagnosis not present

## 2023-10-17 LAB — POCT URINE DIPSTICK
Bilirubin, UA: NEGATIVE
Blood, UA: NEGATIVE
Glucose, UA: NEGATIVE mg/dL
Ketones, POC UA: NEGATIVE mg/dL
Leukocytes, UA: NEGATIVE
Nitrite, UA: NEGATIVE
POC PROTEIN,UA: NEGATIVE
Spec Grav, UA: 1.015 (ref 1.010–1.025)
Urobilinogen, UA: 1 U/dL
pH, UA: 7 (ref 5.0–8.0)

## 2023-10-17 MED ORDER — ONDANSETRON 8 MG PO TBDP
8.0000 mg | ORAL_TABLET | Freq: Once | ORAL | Status: AC
  Administered 2023-10-17: 8 mg via ORAL

## 2023-10-17 MED ORDER — PROMETHAZINE HCL 25 MG PO TABS: 25.0000 mg | ORAL_TABLET | Freq: Three times a day (TID) | ORAL | 0 refills | Status: AC | PRN
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
## 2023-10-17 NOTE — Discharge Instructions (Addendum)
 You were seen today for nausea, vomiting, and stomach discomfort that have been going on for about two weeks. Your vital signs and exam were reassuring, and your urine test did not show anything concerning. We gave you Zofran  in the clinic to help with nausea. Because of your history of kidney transplant and the length of time you have been feeling this way, we drew some blood work today to check your electrolytes, kidney and liver function, blood counts, and pancreas enzymes. We will only contact you if something abnormal is found, but you may also view your results on MyChart or review them with your primary care provider. To help manage your symptoms at home, it is important to stay well hydrated by sipping water , electrolyte drinks, or clear broths throughout the day. Eating a bland diet such as plain rice, toast, bananas, applesauce, and oatmeal may be easier on your stomach. Avoid dairy products, fried or greasy foods, spicy foods, and heavy meals until you start to feel better. Rest when needed and try to avoid skipping meals altogether, as small, frequent snacks can sometimes ease nausea. If your symptoms do not improve within the next couple of days, follow up with your primary care provider for re-evaluation. Seek emergency care right away if you develop severe or worsening abdominal pain, persistent vomiting with inability to keep fluids down, fever, confusion, chest pain, blood in vomit or stool, or if you feel very weak or lightheaded.

## 2023-10-17 NOTE — ED Triage Notes (Addendum)
 Pt st's she has not felt good in approx 2 weeks  St's she has had nausea with occasional vomiting, feeling weak and unable to eat.  Pt st's her grandchildren are in her home and they have had a virus  Pt denies any diarrhea  Pt c/o generalized body aches

## 2023-10-22 LAB — CBC WITH DIFFERENTIAL/PLATELET
Basophils Absolute: 0.1 x10E3/uL (ref 0.0–0.2)
Basos: 1 %
EOS (ABSOLUTE): 0.1 x10E3/uL (ref 0.0–0.4)
Eos: 2 %
Hematocrit: 49.4 % — ABNORMAL HIGH (ref 34.0–46.6)
Hemoglobin: 15.5 g/dL (ref 11.1–15.9)
Immature Grans (Abs): 0 x10E3/uL (ref 0.0–0.1)
Immature Granulocytes: 0 %
Lymphocytes Absolute: 1.2 x10E3/uL (ref 0.7–3.1)
Lymphs: 20 %
MCH: 29.6 pg (ref 26.6–33.0)
MCHC: 31.4 g/dL — ABNORMAL LOW (ref 31.5–35.7)
MCV: 94 fL (ref 79–97)
Monocytes Absolute: 0.5 x10E3/uL (ref 0.1–0.9)
Monocytes: 8 %
Neutrophils Absolute: 4 x10E3/uL (ref 1.4–7.0)
Neutrophils: 69 %
Platelets: 241 x10E3/uL (ref 150–450)
RBC: 5.24 x10E6/uL (ref 3.77–5.28)
RDW: 14.4 % (ref 11.7–15.4)
WBC: 5.9 x10E3/uL (ref 3.4–10.8)

## 2023-10-23 ENCOUNTER — Ambulatory Visit (HOSPITAL_COMMUNITY): Payer: Self-pay

## 2023-10-24 LAB — COMPREHENSIVE METABOLIC PANEL WITH GFR
ALT: 8 IU/L (ref 0–32)
AST: 13 IU/L (ref 0–40)
Albumin: 4 g/dL (ref 3.9–4.9)
Alkaline Phosphatase: 66 IU/L (ref 49–135)
BUN/Creatinine Ratio: 8 — ABNORMAL LOW (ref 12–28)
BUN: 7 mg/dL — ABNORMAL LOW (ref 8–27)
Bilirubin Total: 0.2 mg/dL (ref 0.0–1.2)
CO2: 19 mmol/L — ABNORMAL LOW (ref 20–29)
Calcium: 9.4 mg/dL (ref 8.7–10.3)
Chloride: 104 mmol/L (ref 96–106)
Creatinine, Ser: 0.92 mg/dL (ref 0.57–1.00)
Globulin, Total: 2.8 g/dL (ref 1.5–4.5)
Glucose: 113 mg/dL — ABNORMAL HIGH (ref 70–99)
Potassium: 3.7 mmol/L (ref 3.5–5.2)
Sodium: 142 mmol/L (ref 134–144)
Total Protein: 6.8 g/dL (ref 6.0–8.5)
eGFR: 71 mL/min/1.73 (ref >59)

## 2023-10-24 LAB — SPECIMEN STATUS REPORT

## 2023-10-24 LAB — LIPASE: Lipase: 25 U/L (ref 14–72)

## 2023-11-05 ENCOUNTER — Encounter: Payer: Self-pay | Admitting: Gastroenterology

## 2023-11-05 ENCOUNTER — Encounter (HOSPITAL_COMMUNITY): Payer: Self-pay

## 2023-11-06 ENCOUNTER — Other Ambulatory Visit (INDEPENDENT_AMBULATORY_CARE_PROVIDER_SITE_OTHER): Payer: Self-pay | Admitting: Adult Health

## 2023-11-06 DIAGNOSIS — E119 Type 2 diabetes mellitus without complications: Secondary | ICD-10-CM

## 2023-11-09 ENCOUNTER — Encounter (HOSPITAL_COMMUNITY): Payer: Self-pay

## 2023-11-10 ENCOUNTER — Other Ambulatory Visit (INDEPENDENT_AMBULATORY_CARE_PROVIDER_SITE_OTHER): Payer: Self-pay | Admitting: Family Medicine

## 2023-11-10 DIAGNOSIS — Z638 Other specified problems related to primary support group: Secondary | ICD-10-CM

## 2023-11-15 ENCOUNTER — Other Ambulatory Visit: Payer: Self-pay | Admitting: Student

## 2023-11-16 NOTE — Telephone Encounter (Signed)
 Medication sent to pharmacy

## 2023-11-18 ENCOUNTER — Encounter (HOSPITAL_COMMUNITY): Payer: Self-pay

## 2023-11-18 ENCOUNTER — Other Ambulatory Visit: Payer: Self-pay

## 2023-11-18 ENCOUNTER — Observation Stay (HOSPITAL_COMMUNITY)
Admission: EM | Admit: 2023-11-18 | Discharge: 2023-11-19 | Disposition: A | Discharge status: Home / Self Care | Attending: Internal Medicine | Admitting: Internal Medicine

## 2023-11-18 ENCOUNTER — Ambulatory Visit
Admission: RE | Admit: 2023-11-18 | Discharge: 2023-11-18 | Disposition: A | Discharge status: Home / Self Care | Payer: Self-pay | Source: Ambulatory Visit | Attending: Family Medicine | Admitting: Family Medicine

## 2023-11-18 ENCOUNTER — Emergency Department (HOSPITAL_COMMUNITY)

## 2023-11-18 VITALS — BP 121/75 | HR 96 | Temp 98.1°F | Resp 20 | Ht 66.0 in | Wt 162.0 lb

## 2023-11-18 DIAGNOSIS — E785 Hyperlipidemia, unspecified: Secondary | ICD-10-CM | POA: Diagnosis not present

## 2023-11-18 DIAGNOSIS — E119 Type 2 diabetes mellitus without complications: Secondary | ICD-10-CM | POA: Diagnosis not present

## 2023-11-18 DIAGNOSIS — I120 Hypertensive chronic kidney disease with stage 5 chronic kidney disease or end stage renal disease: Secondary | ICD-10-CM | POA: Insufficient documentation

## 2023-11-18 DIAGNOSIS — Z8616 Personal history of COVID-19: Secondary | ICD-10-CM | POA: Insufficient documentation

## 2023-11-18 DIAGNOSIS — M48061 Spinal stenosis, lumbar region without neurogenic claudication: Secondary | ICD-10-CM | POA: Insufficient documentation

## 2023-11-18 DIAGNOSIS — R10A1 Flank pain, right side: Secondary | ICD-10-CM | POA: Insufficient documentation

## 2023-11-18 DIAGNOSIS — Z79899 Other long term (current) drug therapy: Secondary | ICD-10-CM | POA: Diagnosis not present

## 2023-11-18 DIAGNOSIS — E089 Diabetes mellitus due to underlying condition without complications: Secondary | ICD-10-CM

## 2023-11-18 DIAGNOSIS — F419 Anxiety disorder, unspecified: Secondary | ICD-10-CM | POA: Insufficient documentation

## 2023-11-18 DIAGNOSIS — N390 Urinary tract infection, site not specified: Principal | ICD-10-CM | POA: Insufficient documentation

## 2023-11-18 DIAGNOSIS — K219 Gastro-esophageal reflux disease without esophagitis: Secondary | ICD-10-CM | POA: Diagnosis not present

## 2023-11-18 DIAGNOSIS — N186 End stage renal disease: Secondary | ICD-10-CM | POA: Diagnosis not present

## 2023-11-18 DIAGNOSIS — Z940 Kidney transplant status: Secondary | ICD-10-CM | POA: Insufficient documentation

## 2023-11-18 DIAGNOSIS — N12 Tubulo-interstitial nephritis, not specified as acute or chronic: Principal | ICD-10-CM

## 2023-11-18 DIAGNOSIS — F32A Depression, unspecified: Secondary | ICD-10-CM | POA: Diagnosis not present

## 2023-11-18 DIAGNOSIS — F17210 Nicotine dependence, cigarettes, uncomplicated: Secondary | ICD-10-CM | POA: Insufficient documentation

## 2023-11-18 LAB — URINALYSIS, ROUTINE W REFLEX MICROSCOPIC
Bilirubin Urine: NEGATIVE
Glucose, UA: NEGATIVE mg/dL
Ketones, ur: NEGATIVE mg/dL
Nitrite: NEGATIVE
Protein, ur: NEGATIVE mg/dL
Specific Gravity, Urine: 1.005 (ref 1.005–1.030)
WBC, UA: >50 WBC/hpf (ref 0–5)
pH: 6 (ref 5.0–8.0)

## 2023-11-18 LAB — I-STAT CG4 LACTIC ACID, ED
Lactic Acid, Venous: 1.4 mmol/L (ref 0.5–1.9)
Lactic Acid, Venous: 1.2 mmol/L (ref 0.5–1.9)

## 2023-11-18 LAB — POCT URINE DIPSTICK
Bilirubin, UA: NEGATIVE
Glucose, UA: NEGATIVE mg/dL
Ketones, POC UA: NEGATIVE mg/dL
Nitrite, UA: NEGATIVE
POC PROTEIN,UA: NEGATIVE
Spec Grav, UA: 1.015 (ref 1.010–1.025)
Urobilinogen, UA: 1 U/dL
pH, UA: 7 (ref 5.0–8.0)

## 2023-11-18 LAB — COMPREHENSIVE METABOLIC PANEL WITH GFR
ALT: 9 U/L (ref 0–44)
AST: 17 U/L (ref 15–41)
Albumin: 3.9 g/dL (ref 3.5–5.0)
Alkaline Phosphatase: 63 U/L (ref 38–126)
Anion gap: 12 (ref 5–15)
BUN: 10 mg/dL (ref 8–23)
CO2: 25 mmol/L (ref 22–32)
Calcium: 9.7 mg/dL (ref 8.9–10.3)
Chloride: 106 mmol/L (ref 98–111)
Creatinine, Ser: 0.97 mg/dL (ref 0.44–1.00)
GFR, Estimated: >60 mL/min (ref >60)
Glucose, Bld: 101 mg/dL — ABNORMAL HIGH (ref 70–99)
Potassium: 4.1 mmol/L (ref 3.5–5.1)
Sodium: 143 mmol/L (ref 135–145)
Total Bilirubin: 0.4 mg/dL (ref 0.0–1.2)
Total Protein: 7.3 g/dL (ref 6.5–8.1)

## 2023-11-18 LAB — CBC WITH DIFFERENTIAL/PLATELET
Abs Immature Granulocytes: 0.01 K/uL (ref 0.00–0.07)
Basophils Absolute: 0 K/uL (ref 0.0–0.1)
Basophils Relative: 0 %
Eosinophils Absolute: 0.1 K/uL (ref 0.0–0.5)
Eosinophils Relative: 2 %
HCT: 46.6 % — ABNORMAL HIGH (ref 36.0–46.0)
Hemoglobin: 15.1 g/dL — ABNORMAL HIGH (ref 12.0–15.0)
Immature Granulocytes: 0 %
Lymphocytes Relative: 17 %
Lymphs Abs: 1.3 K/uL (ref 0.7–4.0)
MCH: 29.9 pg (ref 26.0–34.0)
MCHC: 32.4 g/dL (ref 30.0–36.0)
MCV: 92.3 fL (ref 80.0–100.0)
Monocytes Absolute: 0.5 K/uL (ref 0.1–1.0)
Monocytes Relative: 6 %
Neutro Abs: 5.8 K/uL (ref 1.7–7.7)
Neutrophils Relative %: 75 %
Platelets: 202 K/uL (ref 150–400)
RBC: 5.05 MIL/uL (ref 3.87–5.11)
RDW: 13.9 % (ref 11.5–15.5)
WBC: 7.8 K/uL (ref 4.0–10.5)
nRBC: 0 % (ref 0.0–0.2)

## 2023-11-18 LAB — CBC
HCT: 42.6 % (ref 36.0–46.0)
Hemoglobin: 13.9 g/dL (ref 12.0–15.0)
MCH: 29.8 pg (ref 26.0–34.0)
MCHC: 32.6 g/dL (ref 30.0–36.0)
MCV: 91.4 fL (ref 80.0–100.0)
Platelets: 175 K/uL (ref 150–400)
RBC: 4.66 MIL/uL (ref 3.87–5.11)
RDW: 14 % (ref 11.5–15.5)
WBC: 6.2 K/uL (ref 4.0–10.5)
nRBC: 0 % (ref 0.0–0.2)

## 2023-11-18 LAB — WET PREP, GENITAL
Clue Cells Wet Prep HPF POC: NONE SEEN
Sperm: NONE SEEN
Trich, Wet Prep: NONE SEEN
WBC, Wet Prep HPF POC: <10 (ref <10)
Yeast Wet Prep HPF POC: NONE SEEN

## 2023-11-18 LAB — HIV ANTIBODY (ROUTINE TESTING W REFLEX): HIV Screen 4th Generation wRfx: NONREACTIVE

## 2023-11-18 LAB — CREATININE, SERUM
Creatinine, Ser: 0.87 mg/dL (ref 0.44–1.00)
GFR, Estimated: >60 mL/min (ref >60)

## 2023-11-18 LAB — GLUCOSE, CAPILLARY
Glucose-Capillary: 89 mg/dL (ref 70–99)
Glucose-Capillary: 141 mg/dL — ABNORMAL HIGH (ref 70–99)

## 2023-11-18 LAB — LIPASE, BLOOD: Lipase: 22 U/L (ref 11–51)

## 2023-11-18 MED ORDER — LORATADINE 10 MG PO TABS
10.0000 mg | ORAL_TABLET | Freq: Every day | ORAL | Status: DC
  Administered 2023-11-18 – 2023-11-19 (×2): 10 mg via ORAL
  Filled 2023-11-18 (×2): qty 1

## 2023-11-18 MED ORDER — ROSUVASTATIN CALCIUM 5 MG PO TABS
5.0000 mg | ORAL_TABLET | Freq: Every day | ORAL | Status: DC
  Administered 2023-11-19: 5 mg via ORAL
  Filled 2023-11-18: qty 1

## 2023-11-18 MED ORDER — TIOTROPIUM BROMIDE MONOHYDRATE 2.5 MCG/ACT IN AERS: 2.0000 | INHALATION_SPRAY | Freq: Every day | RESPIRATORY_TRACT | Status: DC

## 2023-11-18 MED ORDER — ALBUTEROL SULFATE (2.5 MG/3ML) 0.083% IN NEBU: 2.5000 mg | INHALATION_SOLUTION | RESPIRATORY_TRACT | Status: DC | PRN

## 2023-11-18 MED ORDER — TACROLIMUS 1 MG PO CAPS
7.0000 mg | ORAL_CAPSULE | Freq: Two times a day (BID) | ORAL | Status: DC
  Administered 2023-11-18 – 2023-11-19 (×2): 7 mg via ORAL
  Filled 2023-11-18 (×2): qty 7

## 2023-11-18 MED ORDER — HYDRALAZINE HCL 20 MG/ML IJ SOLN: 10.0000 mg | Freq: Four times a day (QID) | INTRAMUSCULAR | Status: DC | PRN

## 2023-11-18 MED ORDER — SODIUM CHLORIDE 0.9 % IV SOLN
1.0000 g | INTRAVENOUS | Status: DC
  Administered 2023-11-19: 1 g via INTRAVENOUS
  Filled 2023-11-18: qty 10

## 2023-11-18 MED ORDER — SODIUM CHLORIDE 0.9 % IV SOLN: INTRAVENOUS | Status: DC

## 2023-11-18 MED ORDER — CYCLOBENZAPRINE HCL 10 MG PO TABS
10.0000 mg | ORAL_TABLET | Freq: Every day | ORAL | Status: DC
  Administered 2023-11-18: 10 mg via ORAL
  Filled 2023-11-18: qty 1

## 2023-11-18 MED ORDER — PROMETHAZINE HCL 25 MG PO TABS: 25.0000 mg | ORAL_TABLET | Freq: Three times a day (TID) | ORAL | Status: DC | PRN

## 2023-11-18 MED ORDER — ACETAMINOPHEN 325 MG PO TABS: 650.0000 mg | ORAL_TABLET | Freq: Four times a day (QID) | ORAL | Status: DC | PRN

## 2023-11-18 MED ORDER — OXYCODONE-ACETAMINOPHEN 10-325 MG PO TABS: 1.0000 | ORAL_TABLET | Freq: Three times a day (TID) | ORAL | Status: DC | PRN

## 2023-11-18 MED ORDER — FENTANYL CITRATE (PF) 50 MCG/ML IJ SOSY
12.5000 ug | PREFILLED_SYRINGE | INTRAMUSCULAR | Status: DC | PRN
  Filled 2023-11-18: qty 1

## 2023-11-18 MED ORDER — AMLODIPINE BESYLATE 10 MG PO TABS
10.0000 mg | ORAL_TABLET | Freq: Every day | ORAL | Status: DC
Start: 2023-11-18 — End: 2023-11-19
  Administered 2023-11-18: 10 mg via ORAL
  Filled 2023-11-18: qty 1

## 2023-11-18 MED ORDER — SODIUM CHLORIDE 0.9 % IV BOLUS
1000.0000 mL | Freq: Once | INTRAVENOUS | Status: AC
  Administered 2023-11-18: 1000 mL via INTRAVENOUS

## 2023-11-18 MED ORDER — OXYCODONE-ACETAMINOPHEN 5-325 MG PO TABS
1.0000 | ORAL_TABLET | Freq: Three times a day (TID) | ORAL | Status: DC | PRN
  Administered 2023-11-18 – 2023-11-19 (×2): 1 via ORAL
  Filled 2023-11-18 (×2): qty 1

## 2023-11-18 MED ORDER — PREDNISONE 5 MG PO TABS
5.0000 mg | ORAL_TABLET | Freq: Every day | ORAL | Status: DC
  Administered 2023-11-18 – 2023-11-19 (×2): 5 mg via ORAL
  Filled 2023-11-18 (×2): qty 1

## 2023-11-18 MED ORDER — ONDANSETRON HCL 4 MG PO TABS: 4.0000 mg | ORAL_TABLET | Freq: Four times a day (QID) | ORAL | Status: DC | PRN

## 2023-11-18 MED ORDER — ACETAMINOPHEN 650 MG RE SUPP: 650.0000 mg | Freq: Four times a day (QID) | RECTAL | Status: DC | PRN

## 2023-11-18 MED ORDER — ENOXAPARIN SODIUM 40 MG/0.4ML IJ SOSY
40.0000 mg | PREFILLED_SYRINGE | INTRAMUSCULAR | Status: DC
  Administered 2023-11-18: 40 mg via SUBCUTANEOUS
  Filled 2023-11-18: qty 0.4

## 2023-11-18 MED ORDER — UMECLIDINIUM BROMIDE 62.5 MCG/ACT IN AEPB: 1.0000 | INHALATION_SPRAY | Freq: Every day | RESPIRATORY_TRACT | Status: DC | PRN

## 2023-11-18 MED ORDER — SODIUM BICARBONATE 650 MG PO TABS
650.0000 mg | ORAL_TABLET | Freq: Three times a day (TID) | ORAL | Status: DC
  Administered 2023-11-18 – 2023-11-19 (×2): 650 mg via ORAL
  Filled 2023-11-18 (×2): qty 1

## 2023-11-18 MED ORDER — ONDANSETRON 4 MG PO TBDP
4.0000 mg | ORAL_TABLET | Freq: Once | ORAL | Status: AC
  Administered 2023-11-18: 4 mg via ORAL

## 2023-11-18 MED ORDER — SENNA 8.6 MG PO TABS
1.0000 | ORAL_TABLET | Freq: Two times a day (BID) | ORAL | Status: DC
  Administered 2023-11-18 – 2023-11-19 (×2): 8.6 mg via ORAL
  Filled 2023-11-18 (×2): qty 1

## 2023-11-18 MED ORDER — SERTRALINE HCL 25 MG PO TABS: 25.0000 mg | ORAL_TABLET | Freq: Every day | ORAL | Status: DC

## 2023-11-18 MED ORDER — FUROSEMIDE 20 MG PO TABS: 20.0000 mg | ORAL_TABLET | Freq: Every day | ORAL | Status: DC

## 2023-11-18 MED ORDER — SODIUM CHLORIDE 0.9 % IV SOLN
1.0000 g | Freq: Once | INTRAVENOUS | Status: AC
  Administered 2023-11-18: 1 g via INTRAVENOUS
  Filled 2023-11-18: qty 10

## 2023-11-18 MED ORDER — MYCOPHENOLATE SODIUM 180 MG PO TBEC
540.0000 mg | DELAYED_RELEASE_TABLET | Freq: Two times a day (BID) | ORAL | Status: DC
Start: 2023-11-18 — End: 2023-11-19
  Administered 2023-11-18 – 2023-11-19 (×2): 540 mg via ORAL
  Filled 2023-11-18 (×2): qty 3

## 2023-11-18 MED ORDER — ONDANSETRON HCL 4 MG/2ML IJ SOLN: 4.0000 mg | Freq: Four times a day (QID) | INTRAMUSCULAR | Status: DC | PRN

## 2023-11-18 MED ORDER — POLYETHYLENE GLYCOL 3350 17 G PO PACK: 17.0000 g | PACK | Freq: Every day | ORAL | Status: DC | PRN

## 2023-11-18 MED ORDER — INSULIN ASPART 100 UNIT/ML IJ SOLN: 0.0000 [IU] | Freq: Every day | INTRAMUSCULAR | Status: DC

## 2023-11-18 MED ORDER — BISACODYL 10 MG RE SUPP: 10.0000 mg | Freq: Every day | RECTAL | Status: DC | PRN

## 2023-11-18 MED ORDER — SERTRALINE HCL 25 MG PO TABS: 25.0000 mg | ORAL_TABLET | Freq: Every day | ORAL | Status: DC | PRN

## 2023-11-18 MED ORDER — OXYCODONE HCL 5 MG PO TABS
5.0000 mg | ORAL_TABLET | Freq: Three times a day (TID) | ORAL | Status: DC | PRN
  Administered 2023-11-18 – 2023-11-19 (×2): 5 mg via ORAL
  Filled 2023-11-18 (×2): qty 1

## 2023-11-18 MED ORDER — INSULIN ASPART 100 UNIT/ML IJ SOLN
0.0000 [IU] | Freq: Three times a day (TID) | INTRAMUSCULAR | Status: DC
  Administered 2023-11-19: 1 [IU] via SUBCUTANEOUS

## 2023-11-18 MED ORDER — MAGNESIUM OXIDE -MG SUPPLEMENT 400 (240 MG) MG PO TABS
200.0000 mg | ORAL_TABLET | Freq: Two times a day (BID) | ORAL | Status: DC
  Administered 2023-11-18 – 2023-11-19 (×2): 200 mg via ORAL
  Filled 2023-11-18 (×2): qty 1

## 2023-11-18 MED ORDER — PANTOPRAZOLE SODIUM 40 MG PO TBEC
40.0000 mg | DELAYED_RELEASE_TABLET | Freq: Every day | ORAL | Status: DC
  Administered 2023-11-18 – 2023-11-19 (×2): 40 mg via ORAL
  Filled 2023-11-18 (×2): qty 1

## 2023-11-18 MED ORDER — MOLNUPIRAVIR 200 MG PO CAPS: 4.0000 | ORAL_CAPSULE | Freq: Two times a day (BID) | ORAL | Status: DC

## 2023-11-18 NOTE — ED Provider Notes (Signed)
 [place] [facility] AT [facility] Provider Note   CSN: [id number] Arrival date & time: 11/18/23  1035     Patient presents with: Female GU Problem   Kristen Moreno is a 63 y.o. female with a past medical history significant for hypertension, diabetes, and history of renal transplant (on prednisone , tacrolimus , mycophenolate ) who presents to the ED due to 3 days of dysuria, right flank pain, and suprapubic pain.  Also endorses nausea and vomiting.  Denies fever at home. Notes she is struggling to stay hydrated due to nausea and vomiting.  Also endorses vaginal discharge and pruritus.  Last sexually active 1 month ago.  No concern for STIs.  Reports frequent UTIs.  Last UTI was roughly 1 month ago.  Is followed by nephrology. Seen at [facility] prior to arrival and sent to the ED for further evaluation.   History obtained from patient and past medical records. No interpreter used during encounter.       Prior to Admission medications   Medication Sig Start Date End Date Taking? Authorizing Provider  acetaminophen  (TYLENOL ) 500 MG tablet Take 1,500-2,000 mg by mouth daily as needed (pain).    [provider]  albuterol  (PROVENTIL  HFA) 108 (90 Base) MCG/ACT inhaler Inhale 1-2 puffs into the lungs every 6 (six) hours as needed for wheezing or shortness of breath. 01/03/22   Teresa Carrier, MD  amLODipine  (NORVASC ) 10 MG tablet TAKE 1 TABLET BY MOUTH EVERYDAY AT BEDTIME 09/24/23   Harrie Bruckner, DO  amoxicillin  (AMOXIL ) 500 MG capsule Take 500 mg by mouth 2 (two) times daily. 09/30/23   [provider]  atorvastatin  (LIPITOR) 40 MG tablet Take 1 tablet (40 mg total) by mouth daily. 09/16/23   Berkeley Adelita PENNER, MD  azithromycin  (ZITHROMAX ) 250 MG tablet Take 250 mg by mouth as directed. 01/23/21   [provider]  Biotin w/ Vitamins C & E (HAIR SKIN & NAILS GUMMIES PO) Take 1 tablet by mouth daily.    [provider]  Blood Glucose  Monitoring Suppl (ACCU-CHEK GUIDE ME) w/Device KIT MISCELLANEOUS 01/23/21   [provider]  cetirizine  (ZYRTEC ) 5 MG tablet Take 5 mg by mouth daily. 03/19/21   [provider]  ciprofloxacin  (CIPRO ) 500 MG tablet Take 500 mg by mouth 2 (two) times daily. 09/09/23   [provider]  cyclobenzaprine  (FLEXERIL ) 10 MG tablet Take 10 mg by mouth at bedtime.    [provider]  dexamethasone  (DECADRON ) 6 MG tablet Take 1 tablet by mouth daily. Patient not taking: Reported on 10/17/2023    [provider]  diazepam (VALIUM) 5 MG tablet Take 5 mg by mouth. 03/19/21   [provider]  furosemide  (LASIX ) 20 MG tablet Take 20 mg by mouth daily. 01/23/21   [provider]  HYDROcodone -acetaminophen  (NORCO) 10-325 MG tablet Oral 01/23/21   [provider]  HYDROcodone -acetaminophen  (NORCO/VICODIN) 5-325 MG tablet Take 1-2 tablets by mouth. 01/23/21   [provider]  Insulin  Pen Needle (BD PEN NEEDLE NANO 2ND GEN) 32G X 4 MM MISC 1 Package by Does not apply route 2 (two) times daily. 04/10/22   Berkeley Adelita PENNER, MD  Magnesium  250 MG TABS Take 250 mg by mouth 2 (two) times daily.    [provider]  metFORMIN (GLUCOPHAGE-XR) 750 MG 24 hr tablet Take 1 tablet by mouth daily with breakfast.    [provider]  methocarbamol  (ROBAXIN ) 500 MG tablet Take 500 mg by mouth as directed. Patient  not taking: Reported on 10/17/2023 07/10/21   [provider]  molnupiravir EUA (LAGEVRIO) 200 MG CAPS capsule Take 4 capsules by mouth 2 (two) times daily. 01/23/21   [provider]  mycophenolate  (MYFORTIC ) 180 MG EC tablet Take 3 tablets (540 mg total) by mouth 2 (two) times daily. 04/15/21   Dawley, Troy C, DO  naloxone (NARCAN) nasal spray 4 mg/0.1 mL Place 1 spray into the nose once. 09/23/22   [provider]  omeprazole  (PRILOSEC) 20 MG capsule Take 1 capsule by mouth once daily 11/16/23   Juberg, Christopher,  DO  ondansetron  (ZOFRAN ) 4 MG tablet Take 1 tablet (4 mg total) by mouth every 8 (eight) hours as needed for nausea or vomiting. 07/10/22   Emmy Justus DEL, MD  oxyCODONE -acetaminophen  (PERCOCET) 10-325 MG tablet Take 1 tablet by mouth every 8 (eight) hours as needed.    [provider]  predniSONE  (DELTASONE ) 5 MG tablet Take 5 mg by mouth daily.    [provider]  promethazine  (PHENERGAN ) 25 MG tablet Take 1 tablet (25 mg total) by mouth every 8 (eight) hours as needed for nausea or vomiting. 10/17/23   Iola Lukes, FNP  protein supplement (UNJURY CHICKEN SOUP) POWD Take 7 g (2 oz total) by mouth 4 (four) times daily. 10/13/23   Berkeley Adelita PENNER, MD  rosuvastatin (CRESTOR) 5 MG tablet Take 5 mg by mouth daily. 03/19/21   [provider]  Semaglutide ,0.25 or 0.5MG /DOS, (OZEMPIC , 0.25 OR 0.5 MG/DOSE,) 2 MG/3ML SOPN Inject 0.5 mg into the skin once a week. 08/20/23   Danford, Katy D, NP  sertraline  (ZOLOFT ) 25 MG tablet Take 1 tablet (25 mg total) by mouth daily. 09/16/23 12/15/23  Berkeley Adelita PENNER, MD  sodium bicarbonate  650 MG tablet Take 650 mg by mouth 3 (three) times daily.    [provider]  sulfamethoxazole -trimethoprim  (BACTRIM ) 400-80 MG tablet Take 1 tablet by mouth as directed. 01/23/21   [provider]  tacrolimus  (PROGRAF ) 1 MG capsule Take 7 mg by mouth 2 (two) times daily.    [provider]  Tiotropium Bromide  Monohydrate 2.5 MCG/ACT AERS Inhale 2 puffs into the lungs daily. 01/03/22   Teresa Carrier, MD  zolpidem  (AMBIEN ) 5 MG tablet Oral 01/23/21   [provider]    Allergies: Ace inhibitors and Lisinopril    Review of Systems  Constitutional:  Negative for fever.  Respiratory:  Negative for shortness of breath.   Cardiovascular:  Negative for chest pain.  Gastrointestinal:  Positive for abdominal pain.  Genitourinary:  Positive for dysuria, flank pain and vaginal discharge.    Updated Vital Signs BP  136/78 (BP Location: Right Arm)   Pulse 88   Temp 99.3 F (37.4 C) (Oral)   Resp 18   SpO2 100%   Physical Exam Vitals and nursing note reviewed.  Constitutional:      General: She is not in acute distress.    Appearance: She is not ill-appearing.  HENT:     Head: Normocephalic.  Eyes:     Pupils: Pupils are equal, round, and reactive to light.  Cardiovascular:     Rate and Rhythm: Normal rate and regular rhythm.     Pulses: Normal pulses.     Heart sounds: Normal heart sounds. No murmur heard.    No friction rub. No gallop.  Pulmonary:     Effort: Pulmonary effort is normal.     Breath sounds: Normal breath sounds.  Abdominal:     General:  Abdomen is flat. There is no distension.     Palpations: Abdomen is soft.     Tenderness: There is abdominal tenderness. There is no guarding or rebound.     Comments: Suprapubic tenderness. +right CVA tenderness  Musculoskeletal:        General: Normal range of motion.     Cervical back: Neck supple.  Skin:    General: Skin is warm and dry.  Neurological:     General: No focal deficit present.     Mental Status: She is alert.  Psychiatric:        Mood and Affect: Mood normal.        Behavior: Behavior normal.     (all labs ordered are listed, but only abnormal results are displayed) Labs Reviewed  URINALYSIS, ROUTINE W REFLEX MICROSCOPIC - Abnormal; Notable for the following components:      Result Value   APPearance HAZY (*)    Hgb urine dipstick SMALL (*)    Leukocytes,Ua LARGE (*)    Bacteria, UA MANY (*)    All other components within normal limits  CBC WITH DIFFERENTIAL/PLATELET - Abnormal; Notable for the following components:   Hemoglobin 15.1 (*)    HCT 46.6 (*)    All other components within normal limits  COMPREHENSIVE METABOLIC PANEL WITH GFR - Abnormal; Notable for the following components:   Glucose, Bld 101 (*)    All other components within normal limits  WET PREP, GENITAL  URINE CULTURE  CULTURE, BLOOD  (ROUTINE X 2)  CULTURE, BLOOD (ROUTINE X 2)  LIPASE, BLOOD  I-STAT CG4 LACTIC ACID, ED  I-STAT CG4 LACTIC ACID, ED  GC/CHLAMYDIA PROBE AMP ([place]) NOT AT [facility]    EKG: None  Radiology: CT Renal Stone Study Result Date: 11/18/2023 EXAM: CT ABDOMEN AND PELVIS WITHOUT CONTRAST 11/18/2023 01:20:50 PM TECHNIQUE: CT of the abdomen and pelvis was performed without the administration of intravenous contrast. Multiplanar reformatted images are provided for review. Automated exposure control, iterative reconstruction, and/or weight-based adjustment of the mA/kV was utilized to reduce the radiation dose to as low as reasonably achievable. COMPARISON: 11/10/2021 CLINICAL HISTORY: Abdominal/flank pain, stone suspected. FINDINGS: LOWER CHEST: Extensive coronary and aortic calcified atheromatous plaque. Small pericardial effusion, slightly increased from previous. Dependent atelectasis posteriorly in the lung bases. LIVER: The liver is unremarkable. GALLBLADDER AND BILE DUCTS: Gallbladder decompressed. No biliary ductal dilatation. SPLEEN: No acute abnormality. PANCREAS: No acute abnormality. ADRENAL GLANDS: No acute abnormality. KIDNEYS, URETERS AND BLADDER: Innumerable cysts throughout both kidneys , stable from previous. Per consensus, no follow-up is needed for simple Bosniak type 1 and 2 renal cysts, unless the patient has a malignancy history or risk factors. Transplanted kidney in the right iliac fossa is unremarkable. No stones in the kidneys or ureters. No hydronephrosis. No perinephric or periureteral stranding. Urinary bladder is unremarkable. GI AND BOWEL: Small hiatal hernia. Normal appendix. A few scattered colonic diverticula, most numerous in the sigmoid segment, without adjacent inflammatory change. Stomach demonstrates no acute abnormality. There is no bowel obstruction. PERITONEUM AND RETROPERITONEUM: No ascites. No free air. VASCULATURE: Aorta is normal in caliber. No AAA. LYMPH NODES: No  lymphadenopathy. REPRODUCTIVE ORGANS: Post hysterectomy. BONES AND SOFT TISSUES: Stable PLIF L4-L5. Vertebral endplate spurring at multiple levels in the lower thoracic spine. No acute osseous abnormality. No focal soft tissue abnormality. IMPRESSION: 1. No acute findings in the abdomen or pelvis. 2. Small pericardial effusion, slightly increased from previous. 3. Stable Findings consistent with polycystic kidney disease, post right  iliac fossa renal transplant. Electronically signed by: Dayne Hassell MD 11/18/2023 02:33 PM EDT RP Workstation: [id number]     Procedures   Medications Ordered in the ED  cefTRIAXone  (ROCEPHIN ) 1 g in sodium chloride  0.9 % 100 mL IVPB (1 g Intravenous New Bag/Given 11/18/23 1337)  sodium chloride  0.9 % bolus 1,000 mL (1,000 mLs Intravenous New Bag/Given 11/18/23 1336)    Clinical Course as of 11/18/23 1458  Wed Nov 18, 2023  1236 Leukocytes,Ua(!): LARGE [CA]  1236 WBC, UA: >50 [CA]  1236 Bacteria, UA(!): MANY [CA]  1239 Leukocytes,Ua(!): LARGE [CA]    Clinical Course User Index [CA] Lorelle Aleck BROCKS, PA-C                                 Medical Decision Making Amount and/or Complexity of Data Reviewed External Data Reviewed: notes.    Details: UC note Labs: ordered. Decision-making details documented in ED Course. Radiology: ordered and independent interpretation performed. Decision-making details documented in ED Course.  Risk Decision regarding hospitalization.   This patient presents to the ED for concern of dysuria, this involves an extensive number of treatment options, and is a complaint that carries with it a high risk of complications and morbidity.  The differential diagnosis includes acute cystitis, pyelonephritis, infected kidney stone, sepsis, etc  63 year old female presents to the ED from urgent care due to dysuria, flank pain, suprapubic pain.  History of renal transplant currently on immunosuppressive treatment.  Frequent UTIs.   Last UTI 1 month ago.  Upon arrival, stable vitals.  Patient in no acute distress.  Abdomen soft, nondistended with suprapubic tenderness and right CVA tenderness.  CT renal study ordered to rule out infected kidney stone.  UA with evidence of infection.  Dose of Rocephin  given.  Routine labs ordered.  IV fluids given.  Pelvic exam performed by PA student with my assistance. Very mind amount of vaginal discharge. No CMT. No adnexa masses or tenderness.  Low suspicion for PID.  CBC with no leukocytosis.  Elevated hemoglobin of 15.1.  CMP with normal renal function.  No major electrolyte derangements.  Lipase normal.  Lactic acid normal. Wet prep negative.   CT renal study personally reviewed and interpreted which is negative for any acute abnormalities.  Does show small pericardial effusion slightly increased from previous.  Stable findings consistent with post renal transplant.  Shared decision making in regards to admission for IV antibiotics vs p.o. antibiotics and patient prefers to be admitted for IV antibiotics which I find to be reasonable given her history of renal transplant and on immunosuppressive medications.  Will discuss with hospitalist for admission. Discussed with Dr. Rogelia who agrees with assessment and plan.   Co morbidities that complicate the patient evaluation  Hx renal transplant  Test / Admission - Considered:  Admit for IV antibiotics for pyelonephritis  2:55 PM Discussed with Dr. Sherrill with [facility] who agrees to admit patient. Urine culture and blood cultures pending.      Final diagnoses:  Pyelonephritis  History of renal transplant    ED Discharge Orders     None          Lorelle Aleck BROCKS DEVONNA 11/18/23 1458    Rogelia Jerilynn RAMAN, MD 11/19/23 [phone]

## 2023-11-18 NOTE — ED Triage Notes (Signed)
 Patient reports right-sided pain, pressure with urination, and nausea. States, "I think I have another UTI. I'm in a lot of pain and feel pressure when trying to urinate and afterward." Denies fever. Requests blood work for kidney specialist (tests not specified).
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
## 2023-11-18 NOTE — Discharge Instructions (Signed)
 Please go to the nearest emergency room
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
## 2023-11-18 NOTE — ED Triage Notes (Signed)
 Pt arrived via POV, c/o vaginal pressure, itching, and frequent urination. Denies any blood in urine. States some white vaginal discharge.   Hx of kidney transplant, concerned related

## 2023-11-18 NOTE — ED Notes (Signed)
 Patient is being discharged from the Urgent Care and sent to the Emergency Department via pov . Per Dr. Howell, patient is in need of higher level of care due to Flank pain/dysuria/immunocompromised. Patient is aware and verbalizes understanding of plan of care.  Vitals:   11/18/23 0904  BP: 121/75  Pulse: 96  Resp: 20  Temp: 98.1 F (36.7 C)  SpO2: 97%

## 2023-11-19 DIAGNOSIS — N390 Urinary tract infection, site not specified: Secondary | ICD-10-CM | POA: Diagnosis not present

## 2023-11-19 LAB — CBC WITH DIFFERENTIAL/PLATELET
Abs Immature Granulocytes: 0.02 K/uL (ref 0.00–0.07)
Basophils Absolute: 0 K/uL (ref 0.0–0.1)
Basophils Relative: 0 %
Eosinophils Absolute: 0.1 K/uL (ref 0.0–0.5)
Eosinophils Relative: 2 %
HCT: 44.1 % (ref 36.0–46.0)
Hemoglobin: 13.9 g/dL (ref 12.0–15.0)
Immature Granulocytes: 0 %
Lymphocytes Relative: 19 %
Lymphs Abs: 1 K/uL (ref 0.7–4.0)
MCH: 29.8 pg (ref 26.0–34.0)
MCHC: 31.5 g/dL (ref 30.0–36.0)
MCV: 94.6 fL (ref 80.0–100.0)
Monocytes Absolute: 0.3 K/uL (ref 0.1–1.0)
Monocytes Relative: 5 %
Neutro Abs: 3.8 K/uL (ref 1.7–7.7)
Neutrophils Relative %: 74 %
Platelets: 212 K/uL (ref 150–400)
RBC: 4.66 MIL/uL (ref 3.87–5.11)
RDW: 14 % (ref 11.5–15.5)
WBC: 5.2 K/uL (ref 4.0–10.5)
nRBC: 0 % (ref 0.0–0.2)

## 2023-11-19 LAB — COMPREHENSIVE METABOLIC PANEL WITH GFR
ALT: 8 U/L (ref 0–44)
AST: 14 U/L — ABNORMAL LOW (ref 15–41)
Albumin: 3.6 g/dL (ref 3.5–5.0)
Alkaline Phosphatase: 63 U/L (ref 38–126)
Anion gap: 13 (ref 5–15)
BUN: 12 mg/dL (ref 8–23)
CO2: 20 mmol/L — ABNORMAL LOW (ref 22–32)
Calcium: 9.4 mg/dL (ref 8.9–10.3)
Chloride: 107 mmol/L (ref 98–111)
Creatinine, Ser: 0.95 mg/dL (ref 0.44–1.00)
GFR, Estimated: >60 mL/min (ref >60)
Glucose, Bld: 120 mg/dL — ABNORMAL HIGH (ref 70–99)
Potassium: 4.2 mmol/L (ref 3.5–5.1)
Sodium: 140 mmol/L (ref 135–145)
Total Bilirubin: 0.4 mg/dL (ref 0.0–1.2)
Total Protein: 7 g/dL (ref 6.5–8.1)

## 2023-11-19 LAB — GLUCOSE, CAPILLARY
Glucose-Capillary: 135 mg/dL — ABNORMAL HIGH (ref 70–99)
Glucose-Capillary: 111 mg/dL — ABNORMAL HIGH (ref 70–99)

## 2023-11-19 LAB — HEMOGLOBIN A1C
Hgb A1c MFr Bld: 6 % — ABNORMAL HIGH (ref 4.8–5.6)
Mean Plasma Glucose: 125.5 mg/dL

## 2023-11-19 LAB — TSH: TSH: 0.567 u[IU]/mL (ref 0.350–4.500)

## 2023-11-19 MED ORDER — CIPROFLOXACIN HCL 500 MG PO TABS
500.0000 mg | ORAL_TABLET | Freq: Two times a day (BID) | ORAL | 0 refills | Status: DC
Start: 2023-11-19 — End: 2023-11-21

## 2023-11-19 MED ORDER — POLYETHYLENE GLYCOL 3350 17 G PO PACK: 17.0000 g | PACK | Freq: Every day | ORAL | 0 refills | Status: AC | PRN

## 2023-11-19 MED ORDER — AMOXICILLIN-POT CLAVULANATE 875-125 MG PO TABS: 1.0000 | ORAL_TABLET | Freq: Two times a day (BID) | ORAL | 0 refills | Status: DC

## 2023-11-19 NOTE — TOC Transition Note (Signed)
 Transition of Care [facility]) - Discharge Note   Patient Details  Name: Kristen Moreno MRN: [id number] Date of Birth: Jan 03, 1961  Transition of Care [facility]) CM/SW Contact:  Sonda Manuella Quill, RN Phone Number: 11/19/2023, 1:29 PM   Clinical Narrative:    D/C orders received; no IP CM needs.   Final next level of care: Home/Self Care Barriers to Discharge: No Barriers Identified   Patient Goals and CMS Choice Patient states their goals for this hospitalization and ongoing recovery are:: home          Discharge Placement                       Discharge Plan and Services Additional resources added to the After Visit Summary for     Discharge Planning Services: CM Consult            DME Arranged: N/A DME Agency: NA       HH Arranged: NA HH Agency: NA        Social Drivers of Health (SDOH) Interventions SDOH Screenings   Food Insecurity: Food Insecurity Present (11/19/2023)  Housing: High Risk (11/19/2023)  Transportation Needs: No Transportation Needs (11/19/2023)  Utilities: At Risk (11/19/2023)  Alcohol Screen: Low Risk  (04/03/2022)  Depression (PHQ2-9): Low Risk  (09/03/2023)  Financial Resource Strain: Low Risk  (04/03/2022)  Physical Activity: Inactive (04/03/2022)  Social Connections: Socially Isolated (04/03/2022)  Stress: No Stress Concern Present (04/03/2022)  Tobacco Use: High Risk (11/18/2023)     Readmission Risk Interventions    10/03/2022   11:44 AM  Readmission Risk Prevention Plan  Transportation Screening Complete  PCP or Specialist Appt within 5-7 Days Complete  Home Care Screening Complete  Medication Review (RN CM) Complete

## 2023-11-19 NOTE — TOC Initial Note (Signed)
 Transition of Care [facility]) - Initial/Assessment Note    Patient Details  Name: Kristen Moreno MRN: [id number] Date of Birth: January 15, 1961  Transition [facility] Care [facility]) CM/SW Contact:    Sonda Manuella Quill, RN Phone Number: 11/19/2023, 11:17 AM  Clinical Narrative:                 Spoke w/ pt in room; pt said she lives at home w/ her dtr Kristen Moreno [phone]); she plans to return at d/c; her dtr will provide transportation; pt verified insurance/PCP; she has difficulty paying for food, housing, and utilities; pt denied IPV; she does not have DME, HH services, or home oxygen; pt agreed to receive resources for social services and financial assistance; resources placed in d/c instructions; copy of resources also given to pt; pt verbalized understanding that she will make her own appt w/ agencies of choice; no additional IP CM needs noted at this time.  Expected Discharge Plan: Home/Self Care Barriers to Discharge: Continued Medical Work up   Patient Goals and CMS Choice Patient states their goals for this hospitalization and ongoing recovery are:: home          Expected Discharge Plan and Services   Discharge Planning Services: CM Consult   Living arrangements for the past 2 months: Mobile Home                 DME Arranged: N/A DME Agency: NA       HH Arranged: NA HH Agency: NA        Prior Living Arrangements/Services Living arrangements for the past 2 months: Mobile Home Lives with:: Adult Children Patient language and need for interpreter reviewed:: Yes Do you feel safe going back to the place where you live?: Yes      Need for Family Participation in Patient Care: Yes (Comment) Care giver support system in place?: Yes (comment) Current home services:  (n/a) Criminal Activity/Legal Involvement Pertinent to Current Situation/Hospitalization: No - Comment as needed  Activities of Daily Living   ADL Screening (condition at time of admission) Independently  performs ADLs?: Yes (appropriate for developmental age) Is the patient deaf or have difficulty hearing?: No Does the patient have difficulty seeing, even when wearing glasses/contacts?: No Does the patient have difficulty concentrating, remembering, or making decisions?: No  Permission Sought/Granted Permission sought to share information with : Case Manager Permission granted to share information with : Yes, Verbal Permission Granted  Share Information with NAME: Case Manager     Permission granted to share info w Relationship: Kristen Moreno (dtr) [phone]     Emotional Assessment Appearance:: Appears stated age Attitude/Demeanor/Rapport: Gracious Affect (typically observed): Accepting Orientation: : Oriented to Self, Oriented to Place, Oriented to  Time, Oriented to Situation Alcohol / Substance Use: Not Applicable Psych Involvement: No (comment)  Admission diagnosis:  UTI (urinary tract infection) [N39.0] Pyelonephritis [N12] History of renal transplant [Z94.0] Patient Active Problem List   Diagnosis Date Noted   Spinal stenosis of lumbar region 11/18/2023   UTI (urinary tract infection) 11/18/2023   Stress due to family tension 04/20/2023   Vitamin D  deficiency 01/29/2023   Epidermoid cyst of skin of back 01/09/2023   Acute renal failure superimposed on stage 3a chronic kidney disease (HCC) 10/02/2022   COVID-19 virus infection 10/01/2022   Generalized obesity: Starting BMI 31.78 04/15/2022   Olecranon bursitis of left elbow 07/31/2021   Gastroesophageal reflux disease 07/12/2021   Spondylolisthesis, lumbar region 04/11/2021   Depression 01/26/2021   S/p cadaver  renal transplant 01/10/2021   S/P arteriovenous (AV) fistula creation 01/09/2021   Insomnia 07/30/2020   DM (diabetes mellitus) (HCC) 03/06/2020   Scoliosis of thoracic spine 03/06/2020   Chronic obstructive pulmonary disease (HCC) 01/26/2020   Asthma 12/19/2019   Type 2 diabetes mellitus without  complication, without long-term current use of insulin  (HCC) 11/29/2019   CMV (cytomegalovirus infection) (HCC) 09/09/2019   Diarrhea 06/06/2019   Immunosuppressive management encounter following kidney transplant 06/06/2019   Oral thrush 06/06/2019   Pain at surgical site 04/07/2019   Encounter for monitoring tacrolimus  therapy 03/31/2019   Hypomagnesemia 03/31/2019   Immunosuppression 03/23/2019   TB lung, latent 03/23/2019   History of kidney transplant 03/23/2019   Encounter for fitting and adjustment of extracorporeal dialysis catheter 09/22/2018   Left upper quadrant pain 08/16/2018   Moderate protein-calorie malnutrition 08/12/2018   Other fluid overload 07/06/2018   Inguinal hernia 06/10/2018   Other abnormal glucose 05/05/2018   Right inguinal pain 04/30/2018   Iron deficiency anemia, 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   Other abnormal findings in urine 04/17/2018   Other long term (current) drug therapy 04/17/2018   Unspecified jaundice 04/17/2018   Encounter for adequacy testing for peritoneal dialysis 04/17/2018   Disorder of lipoprotein metabolism, 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   Chronic renal impairment, stage 5 03/19/2018   Peritoneal dialysis catheter, fitting and adjustment 03/19/2018   Plantar fasciitis of right foot 03/10/2018   Shortness of breath 02/12/2018   Pruritus, unspecified 01/22/2018   Encounter for immunization 12/31/2017   Urinary tract infection, site not specified 12/25/2017   Peripheral vascular disease 12/25/2017   Anemia in chronic kidney disease 12/22/2017   Coagulation defect, unspecified 12/22/2017   Fever, unspecified 12/22/2017   Pain, unspecified 12/22/2017   Secondary hyperparathyroidism of renal origin 12/22/2017   End stage renal disease (HCC) 12/22/2017   Gastro-esophageal reflux disease without esophagitis 12/22/2017   Encounter for screening for respiratory tuberculosis 12/22/2017   Rectoceles 08/08/2017   Pelvic organ prolapse quantification  stage 3 cystocele 01/02/2017   Former smoker 11/26/2016   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   Midline cystocele 10/29/2016   Healthcare maintenance 03/19/2016   Hyperlipidemia 03/08/2016   Barrett's esophagus without dysplasia 03/08/2016   Other peritonitis (HCC) 02/28/2016   GERD (gastroesophageal reflux disease) 05/02/2014   Polycystic kidney disease 12/16/2011   ESRD (end stage renal disease) (HCC) 12/16/2011   Hypertension 12/16/2011   Chronic renal insufficiency 12/16/2011   S/P hysterectomy 12/16/2011   Enterococcus UTI 11/17/2011   PCP:  Harrie Bruckner, DO Pharmacy:   [id number] - [place], [place] - [address] [place] [address] Phone: [phone] Fax: [phone]     Social Drivers of Health (SDOH) Social History: SDOH Screenings   Food Insecurity: Food Insecurity Present (11/19/2023)  Housing: High Risk (11/19/2023)  Transportation Needs: No Transportation Needs (11/19/2023)  Utilities: At Risk (11/19/2023)  Alcohol Screen: Low Risk  (04/03/2022)  Depression (PHQ2-9): Low Risk  (09/03/2023)  Financial Resource Strain: Low Risk  (04/03/2022)  Physical Activity: Inactive (04/03/2022)  Social Connections: Socially Isolated (04/03/2022)  Stress: No Stress Concern Present (04/03/2022)  Tobacco Use: High Risk (11/18/2023)   SDOH Interventions: Food Insecurity Interventions: [employer] Provided, Inpatient TOC Housing Interventions: [employer] Provided, Inpatient TOC Transportation Interventions: Intervention Not Indicated, Inpatient TOC Utilities Interventions: Community Resources Provided, Inpatient TOC   Readmission Risk Interventions    10/03/2022   11:44 AM  Readmission Risk Prevention Plan  Transportation Screening Complete  PCP or Specialist Appt within 5-7 Days Complete  Home Care Screening Complete  Medication Review (RN CM) Complete

## 2023-11-19 NOTE — Discharge Summary (Signed)
 Physician Discharge Summary   Patient: Kristen Moreno MRN: [id number] DOB: 1960-01-23  Admit date:     11/18/2023  Discharge date: 11/19/2023  Discharge Physician: Alejandro Marker, DO   PCP: Harrie Bruckner, DO   Recommendations at discharge:   Follow-up with PCP within 1 to 2 weeks repeat CBC, CMP, mag, Phos within 1 week; patient will be going to get blood work on 11/26/2023  Discharge Diagnoses: Principal Problem:   UTI (urinary tract infection)  Resolved Problems:   * No resolved hospital problems. *  Hospital Course: Kristen Moreno is a 63 y.o. female with medical history significant for but not limited to GERD, anxiety, prediabetes, diabetes mellitus type 2, hypertension, hyperlipidemia history of polycystic kidney disease with ESRD now status post renal transplantation on immunosuppressants as well as other comorbidities who presents with burning and discomfort in her urine as well as pressure and increased and frequent urination.  Patient states that symptoms started about 2 to 3 days ago when she developed dysuria, right-sided flank pain as well as suprapubic pain.  She states that she has some nausea vomiting and denied any fevers at home. States that given her nausea vomiting has been difficult to stay hydrated.  She reports several frequent UTIs and followed by nephrology here and as well as [facility].  Her last UTI was about a month ago.  She denies any chest pain or shortness of breath.  She states that she started developing some right sided flank pain that radiated into her groin but now she is having some left-sided flank pain that radiates around.  No changes in bowel habits.  No other concerns or complaints at this time.  Given her symptoms, immunocompromise history of renal transplantation and concern for urinary tract infection and the [facility] was consulted for admission.   **Interim History  Patient steadily improved and her symptoms and felt at baseline.   Her urine culture results have not yet resulted but patient wanted to go home.  Based on her sensitivities in the past urine cultures we will transition to oral Augmentin and ciprofloxacin .  She was advised to go to her PCP and have them change antibiotics based on final urine culture results if necessary.  ADDENDUM 11/1: Urine Cx Resulted Enterobacter Cloace after she was discharged and it is intermediate to Cipro  and Augmentin will not cover it. Will change to p.o. Bactrim  for 7 days.  I called and updated the patient about her findings and she we will stop her p.o. Cipro  and Augmentin.  She was also advised to have her blood work monitored carefully given her renal function and she will go on Thursday 11/6 to get her blood work checked.  I advised her that if her renal function starts going up on the Bactrim  that she will need to come the hospital for further evaluation or if she fails to improve and continues to be symptomatic need to come back to the hospital for IV antibiotics.  She is in agreement with this plan and understands and will discontinue her p.o. Cipro  and Augmentin and start the Bactrim  and monitor carefully.  Assessment and Plan:  UTI, poA: Presents with burning and discomfort as well as increased urinary frequency and suprapubic pain and flank pain.  Urinalysis done and showed a hazy appearance with yellow color urine, negative glucose, small hemoglobin, large leukocytes, negative nitrites, many bacteria, 6-10 RBCs per high-power field, greater than 50 WBCs with urine culture and blood cultures pending.  Wet prep  is negative.  CT renal stone study was done and showed No acute findings in the abdomen or pelvis but did showa  Small pericardial effusion, slightly increased from previous and Stable Findings consistent with polycystic kidney disease, post right iliac fossa renal transplant.  Continue IV fluid hydration with normal saline at 75 mL/h for 1 day.  Continued empiric antibiotics with  IV ceftriaxone  await cultures but she did not want to stay for Urine Cx results.  Patient is currently afebrile and has no leukocytosis and back to her baseline. Will empircially send on p.o. ciprofloxacin  and Augmentin for 5 days  **See Addendum Above   Renal Transplant with history of CKD and ESRD.  Now back to normal creatinine: C/w Rejection meds of prednisone  5 mg p.o. daily, mycophenolate  540 mg p.o. twice daily, and tacrolimus  7 mg p.o. twice daily; continue with sodium bicarbonate  650 mL p.o. 3 times daily.  Advised to have her blood work checked this week and she will go 11/26/2023   Essential Hypertension: Continue with amlodipine  10 mg p.o. daily but hold furosemide  20 mg p.o. daily given that she is getting IV fluid hydration.  Continue monitor blood pressures per protocol.  Added IV hydralazine 10 mg every 6 as needed for SBP if SBP is greater than 180 or DBP greater than 100.  Last blood pressure reading was 150/89  Metabolic acidosis: Mild.  Patient CO2 is now 20, anion gap is 13, chloride level 107.  Continue to monitor trend and repeat CMP within 1 week   Hyperlipidemia: Continue with rosuvastatin 5 mg p.o. daily   Diabetes mellitus type 2.  Hold metformin.  Continue to monitor CBGs and placed on sensitive NovoLog  scale scale insulin  AC and at bedtime.  Checked hemoglobin A1c and was 6.0   GERD/GI prophylaxis: Continue with Pantoprazole  40 mg p.o. daily   Depression and Anxiety: Continue sertraline  25 mg p.o. nightly   Overweight: Complicates overall prognosis and care. Estimated body mass index is 27.16 kg/m as calculated from the following:   Height as of this encounter: 5' 6 (1.676 m).   Weight as of this encounter: 76.3 kg. Weight Loss and Dietary Counseling given  Consultants: None Procedures performed: As delineated above Disposition: Home Diet recommendation:  Cardiac diet DISCHARGE MEDICATION: Allergies as of 11/19/2023       Reactions   Ace Inhibitors Other  (See Comments), Cough   Reaction to lisinopril (mainly)   Lisinopril Cough   Tomato Nausea And Vomiting, Other (See Comments)   Heartburn, too (does not tolerate spicy foods!)        Medication List     TAKE these medications    Accu-Chek Guide Me w/Device Kit MISCELLANEOUS   acetaminophen  500 MG tablet Commonly known as: TYLENOL  Take 500 mg by mouth every 8 (eight) hours as needed (for pain or headaches).   albuterol  108 (90 Base) MCG/ACT inhaler Commonly known as: Proventil  HFA Inhale 1-2 puffs into the lungs every 6 (six) hours as needed for wheezing or shortness of breath.   amLODipine  10 MG tablet Commonly known as: NORVASC  TAKE 1 TABLET BY MOUTH EVERYDAY AT BEDTIME What changed: See the new instructions.   atorvastatin  40 MG tablet Commonly known as: LIPITOR Take 1 tablet (40 mg total) by mouth daily.   BD Pen Needle Nano 2nd Gen 32G X 4 MM Misc Generic drug: Insulin  Pen Needle 1 Package by Does not apply route 2 (two) times daily.   cyclobenzaprine  10 MG tablet Commonly known as:  FLEXERIL  Take 10 mg by mouth at bedtime as needed for muscle spasms.   HAIR SKIN & NAILS GUMMIES PO Take 1 tablet by mouth daily.   Magnesium  250 MG Tabs Take 250 mg by mouth 2 (two) times daily.   Mycophenolic Acid  180 MG Tbec Take 540 mg by mouth See admin instructions. Take 540 mg by mouth in the morning and evening   mycophenolate  180 MG EC tablet Commonly known as: MYFORTIC  Take 3 tablets (540 mg total) by mouth 2 (two) times daily.   naloxone 4 MG/0.1ML Liqd nasal spray kit Commonly known as: NARCAN Place 1 spray into the nose once as needed (for a crisis).   omeprazole  20 MG capsule Commonly known as: PRILOSEC Take 1 capsule by mouth once daily What changed: when to take this   ondansetron  4 MG tablet Commonly known as: Zofran  Take 1 tablet (4 mg total) by mouth every 8 (eight) hours as needed for nausea or vomiting.   oxyCODONE -acetaminophen  10-325 MG  tablet Commonly known as: PERCOCET Take 1 tablet by mouth 3 (three) times daily.   Ozempic  (0.25 or 0.5 MG/DOSE) 2 MG/3ML Sopn Generic drug: Semaglutide (0.25 or 0.5MG /DOS) Inject 0.5 mg into the skin once a week. What changed: when to take this   polyethylene glycol 17 g packet Commonly known as: MIRALAX  / GLYCOLAX  Take 17 g by mouth daily as needed for mild constipation.   predniSONE  5 MG tablet Commonly known as: DELTASONE  Take 5 mg by mouth daily with breakfast.   promethazine  25 MG tablet Commonly known as: PHENERGAN  Take 1 tablet (25 mg total) by mouth every 8 (eight) hours as needed for nausea or vomiting.   protein supplement Powd Commonly known as: UNJURY CHICKEN SOUP Take 7 g (2 oz total) by mouth 4 (four) times daily.   sertraline  25 MG tablet Commonly known as: Zoloft  Take 1 tablet (25 mg total) by mouth daily. What changed:  when to take this reasons to take this   sodium bicarbonate  650 MG tablet Take 650 mg by mouth 3 (three) times daily.   tacrolimus  1 MG capsule Commonly known as: PROGRAF  Take 7 mg by mouth See admin instructions. Take 7 mg by mouth in the morning and evening   Tiotropium Bromide  Monohydrate 2.5 MCG/ACT Aers Inhale 2 puffs into the lungs daily. What changed:  when to take this reasons to take this       Discharge Exam: [facility] Weights   11/18/23 1853  Weight: 76.3 kg   Vitals:   11/19/23 0413 11/19/23 0841  BP: (!) 164/80 (!) 148/86  Pulse: 88 78  Resp: 18   Temp: 98.2 F (36.8 C)   SpO2: 100% 98%   Examination: Physical Exam:  Constitutional: WN/WD overweight African-American female in no acute distress appears calm Respiratory: Diminished to auscultation bilaterally, no wheezing, rales, rhonchi or crackles. Normal respiratory effort and patient is not tachypenic. No accessory muscle use.  Unlabored breathing Cardiovascular: RRR, no murmurs / rubs / gallops. S1 and S2 auscultated. No extremity edema.  Abdomen: Soft,  non-tender, non-distended.  Bowel sounds positive.  GU: Deferred. Musculoskeletal: No clubbing / cyanosis of digits/nails. No joint deformity upper and lower extremities.  Skin: No rashes, lesions, ulcers on limited skin evaluation. No induration; Warm and dry.  Neurologic: CN 2-12 grossly intact with no focal deficits. Romberg sign and cerebellar reflexes not assessed.  Psychiatric: Normal judgment and insight. Alert and oriented x 3. Normal mood and appropriate affect.   Condition at discharge: stable  The results of significant diagnostics from this hospitalization (including imaging, microbiology, ancillary and laboratory) are listed below for reference.   Imaging Studies: CT Renal Stone Study Result Date: 11/18/2023 EXAM: CT ABDOMEN AND PELVIS WITHOUT CONTRAST 11/18/2023 01:20:50 PM TECHNIQUE: CT of the abdomen and pelvis was performed without the administration of intravenous contrast. Multiplanar reformatted images are provided for review. Automated exposure control, iterative reconstruction, and/or weight-based adjustment of the mA/kV was utilized to reduce the radiation dose to as low as reasonably achievable. COMPARISON: 11/10/2021 CLINICAL HISTORY: Abdominal/flank pain, stone suspected. FINDINGS: LOWER CHEST: Extensive coronary and aortic calcified atheromatous plaque. Small pericardial effusion, slightly increased from previous. Dependent atelectasis posteriorly in the lung bases. LIVER: The liver is unremarkable. GALLBLADDER AND BILE DUCTS: Gallbladder decompressed. No biliary ductal dilatation. SPLEEN: No acute abnormality. PANCREAS: No acute abnormality. ADRENAL GLANDS: No acute abnormality. KIDNEYS, URETERS AND BLADDER: Innumerable cysts throughout both kidneys , stable from previous. Per consensus, no follow-up is needed for simple Bosniak type 1 and 2 renal cysts, unless the patient has a malignancy history or risk factors. Transplanted kidney in the right iliac fossa is unremarkable.  No stones in the kidneys or ureters. No hydronephrosis. No perinephric or periureteral stranding. Urinary bladder is unremarkable. GI AND BOWEL: Small hiatal hernia. Normal appendix. A few scattered colonic diverticula, most numerous in the sigmoid segment, without adjacent inflammatory change. Stomach demonstrates no acute abnormality. There is no bowel obstruction. PERITONEUM AND RETROPERITONEUM: No ascites. No free air. VASCULATURE: Aorta is normal in caliber. No AAA. LYMPH NODES: No lymphadenopathy. REPRODUCTIVE ORGANS: Post hysterectomy. BONES AND SOFT TISSUES: Stable PLIF L4-L5. Vertebral endplate spurring at multiple levels in the lower thoracic spine. No acute osseous abnormality. No focal soft tissue abnormality. IMPRESSION: 1. No acute findings in the abdomen or pelvis. 2. Small pericardial effusion, slightly increased from previous. 3. Stable Findings consistent with polycystic kidney disease, post right iliac fossa renal transplant. Electronically signed by: Katheleen Faes MD 11/18/2023 02:33 PM EDT RP Workstation: [id number]   Microbiology: Results for orders placed or performed during the hospital encounter of 11/18/23  Wet prep, genital     Status: None   Collection Time: 11/18/23 12:52 PM  Result Value Ref Range Status   Yeast Wet Prep HPF POC NONE SEEN NONE SEEN Final   Trich, Wet Prep NONE SEEN NONE SEEN Final   Clue Cells Wet Prep HPF POC NONE SEEN NONE SEEN Final   WBC, Wet Prep HPF POC <10 <10 Final    Comment: Swab received with less than 0.5 mL of saline, saline added to specimen, interpret results with caution.   Sperm NONE SEEN  Final    Comment: Performed at [facility], [address], [place], [place] [address]  Urine Culture     Status: Abnormal   Collection Time: 11/18/23  3:24 PM   Specimen: Urine, Clean Catch  Result Value Ref Range Status   Specimen Description   Final    URINE, CLEAN CATCH Performed at [facility], [address] [place], [place] [address]    Special Requests   Final    NONE Performed at [facility], [address], [place], [place] [address]    Culture >=100,000 COLONIES/mL ENTEROBACTER CLOACAE (A)  Final   Report Status 11/20/2023 FINAL  Final   Organism ID, Bacteria ENTEROBACTER CLOACAE (A)  Final      Susceptibility   Enterobacter cloacae - MIC*    CEFEPIME <=0.12 SENSITIVE Sensitive  ERTAPENEM <=0.12 SENSITIVE Sensitive     CIPROFLOXACIN  0.5 INTERMEDIATE Intermediate     GENTAMICIN <=1 SENSITIVE Sensitive     NITROFURANTOIN  64 INTERMEDIATE Intermediate     TRIMETH /SULFA  <=20 SENSITIVE Sensitive     PIP/TAZO Value in next row Sensitive      <=4 SENSITIVEThis is a modified FDA-approved test that has been validated and its performance characteristics determined by the reporting laboratory.  This laboratory is certified under the Clinical Laboratory Improvement Amendments CLIA as qualified to perform high complexity clinical laboratory testing.    MEROPENEM Value in next row Sensitive      <=4 SENSITIVEThis is a modified FDA-approved test that has been validated and its performance characteristics determined by the reporting laboratory.  This laboratory is certified under the Clinical Laboratory Improvement Amendments CLIA as qualified to perform high complexity clinical laboratory testing.    * >=100,000 COLONIES/mL ENTEROBACTER CLOACAE  Blood culture (routine x 2)     Status: None (Preliminary result)   Collection Time: 11/18/23  4:02 PM   Specimen: BLOOD RIGHT HAND  Result Value Ref Range Status   Specimen Description   Final    BLOOD RIGHT HAND Performed at [facility], [address], [place], [place] [address]    Special Requests   Final    BOTTLES DRAWN AEROBIC AND ANAEROBIC Blood Culture results may not be optimal due to an inadequate volume of blood received in culture bottles Performed at [facility], [address]  [place], [place] [address]    Culture   Final    NO GROWTH 3 DAYS Performed at [facility], [address], [place], [place] [address]    Report Status PENDING  Incomplete  Blood culture (routine x 2)     Status: None (Preliminary result)   Collection Time: 11/18/23  4:58 PM   Specimen: BLOOD  Result Value Ref Range Status   Specimen Description   Final    BLOOD BLOOD RIGHT ARM Performed at [facility], [address] [place], [place] [address]    Special Requests   Final    BOTTLES DRAWN AEROBIC AND ANAEROBIC Blood Culture results may not be optimal due to an inadequate volume of blood received in culture bottles Performed at [facility], [address] [place], [place] [address]    Culture   Final    NO GROWTH 3 DAYS Performed at [facility], [address] [place], [place] [address]    Report Status PENDING  Incomplete   Labs: CBC: Recent Labs  Lab 11/18/23 1252 11/18/23 1921 11/19/23 0850  WBC 7.8 6.2 5.2  NEUTROABS 5.8  --  3.8  HGB 15.1* 13.9 13.9  HCT 46.6* 42.6 44.1  MCV 92.3 91.4 94.6  PLT 202 175 212   Basic Metabolic Panel: Recent Labs  Lab 11/18/23 1313 11/18/23 1658 11/19/23 0534  NA 143  --  140  K 4.1  --  4.2  CL 106  --  107  CO2 25  --  20*  GLUCOSE 101*  --  120*  BUN 10  --  12  CREATININE 0.97 0.87 0.95  CALCIUM  9.7  --  9.4   Liver Function Tests: Recent Labs  Lab 11/18/23 1313 11/19/23 0534  AST 17 14*  ALT 9 8  ALKPHOS 63 63  BILITOT 0.4 0.4  PROT 7.3 7.0  ALBUMIN  3.9 3.6   CBG: Recent Labs  Lab 11/18/23 1654  11/18/23 2208 11/19/23 0728 11/19/23 1121  GLUCAP 89 141* 135* 111*   Discharge time spent: greater than 30 minutes.  Signed: Alejandro Marker, DO [facility] 11/21/2023

## 2023-11-20 ENCOUNTER — Ambulatory Visit (HOSPITAL_COMMUNITY): Payer: Self-pay

## 2023-11-20 LAB — GC/CHLAMYDIA PROBE AMP (~~LOC~~) NOT AT ARMC
Chlamydia: NEGATIVE
Comment: NEGATIVE
Comment: NORMAL
Neisseria Gonorrhea: NEGATIVE

## 2023-11-20 LAB — URINE CULTURE
Culture: >=100000 — AB
Culture: >=100000 — AB

## 2023-11-21 ENCOUNTER — Other Ambulatory Visit: Payer: Self-pay | Admitting: Internal Medicine

## 2023-11-21 MED ORDER — SULFAMETHOXAZOLE-TRIMETHOPRIM 800-160 MG PO TABS: 1.0000 | ORAL_TABLET | Freq: Two times a day (BID) | ORAL | 0 refills | Status: AC

## 2023-11-21 NOTE — Progress Notes (Signed)
 Patient requested to be discharged prior to knowing her final sensitivities of urine culture.  Urine culture came back and is Enterobacter Cloacae which is sensitive to Bactrim .  Will call the patient and tell her to stop her ciprofloxacin  and Augmentin and start the Bactrim  for 7 days.

## 2023-11-21 NOTE — Hospital Course (Addendum)
 Kristen Moreno is a 63 y.o. female with medical history significant for but not limited to GERD, anxiety, prediabetes, diabetes mellitus type 2, hypertension, hyperlipidemia history of polycystic kidney disease with ESRD now status post renal transplantation on immunosuppressants as well as other comorbidities who presents with burning and discomfort in her urine as well as pressure and increased and frequent urination.  Patient states that symptoms started about 2 to 3 days ago when she developed dysuria, right-sided flank pain as well as suprapubic pain.  She states that she has some nausea vomiting and denied any fevers at home. States that given her nausea vomiting has been difficult to stay hydrated.  She reports several frequent UTIs and followed by nephrology here and as well as [facility].  Her last UTI was about a month ago.  She denies any chest pain or shortness of breath.  She states that she started developing some right sided flank pain that radiated into her groin but now she is having some left-sided flank pain that radiates around.  No changes in bowel habits.  No other concerns or complaints at this time.  Given her symptoms, immunocompromise history of renal transplantation and concern for urinary tract infection and the [facility] hospitalist was consulted for admission.   **Interim History  Patient steadily improved and her symptoms and felt at baseline.  Her urine culture results have not yet resulted but patient wanted to go home.  Based on her sensitivities in the past urine cultures we will transition to oral Augmentin and ciprofloxacin .  She was advised to go to her PCP and have them change antibiotics based on final urine culture results if necessary.  ADDENDUM 11/1: Urine Cx Resulted Enterobacter Cloace after she was discharged and it is intermediate to Cipro  and Augmentin will not cover it. Will change to p.o. Bactrim  for 7 days.  I called and updated the patient about her findings and she  we will stop her p.o. Cipro  and Augmentin.  She was also advised to have her blood work monitored carefully given her renal function and she will go on Thursday 11/6 to get her blood work checked.  I advised her that if her renal function starts going up on the Bactrim  that she will need to come the hospital for further evaluation or if she fails to improve and continues to be symptomatic need to come back to the hospital for IV antibiotics.  She is in agreement with this plan and understands and will discontinue her p.o. Cipro  and Augmentin and start the Bactrim  and monitor carefully.  Assessment and Plan:  UTI, poA: Presents with burning and discomfort as well as increased urinary frequency and suprapubic pain and flank pain.  Urinalysis done and showed a hazy appearance with yellow color urine, negative glucose, small hemoglobin, large leukocytes, negative nitrites, many bacteria, 6-10 RBCs per high-power field, greater than 50 WBCs with urine culture and blood cultures pending.  Wet prep is negative.  CT renal stone study was done and showed No acute findings in the abdomen or pelvis but did showa  Small pericardial effusion, slightly increased from previous and Stable Findings consistent with polycystic kidney disease, post right iliac fossa renal transplant.  Continue IV fluid hydration with normal saline at 75 mL/h for 1 day.  Continued empiric antibiotics with IV ceftriaxone  await cultures but she did not want to stay for Urine Cx results.  Patient is currently afebrile and has no leukocytosis and back to her baseline. Will empircially send  on p.o. ciprofloxacin  and Augmentin for 5 days  **See Addendum Above   Renal Transplant with history of CKD and ESRD.  Now back to normal creatinine: C/w Rejection meds of prednisone  5 mg p.o. daily, mycophenolate  540 mg p.o. twice daily, and tacrolimus  7 mg p.o. twice daily; continue with sodium bicarbonate  650 mL p.o. 3 times daily.  Advised to have her blood  work checked this week and she will go 11/26/2023   Essential Hypertension: Continue with amlodipine  10 mg p.o. daily but hold furosemide  20 mg p.o. daily given that she is getting IV fluid hydration.  Continue monitor blood pressures per protocol.  Added IV hydralazine 10 mg every 6 as needed for SBP if SBP is greater than 180 or DBP greater than 100.  Last blood pressure reading was 150/89  Metabolic acidosis: Mild.  Patient CO2 is now 20, anion gap is 13, chloride level 107.  Continue to monitor trend and repeat CMP within 1 week   Hyperlipidemia: Continue with rosuvastatin 5 mg p.o. daily   Diabetes mellitus type 2.  Hold metformin.  Continue to monitor CBGs and placed on sensitive NovoLog  scale scale insulin  AC and at bedtime.  Checked hemoglobin A1c and was 6.0   GERD/GI prophylaxis: Continue with Pantoprazole  40 mg p.o. daily   Depression and Anxiety: Continue sertraline  25 mg p.o. nightly   Overweight: Complicates overall prognosis and care. Estimated body mass index is 27.16 kg/m as calculated from the following:   Height as of this encounter: 5' 6 (1.676 m).   Weight as of this encounter: 76.3 kg. Weight Loss and Dietary Counseling given

## 2023-11-23 LAB — CULTURE, BLOOD (ROUTINE X 2)
Culture: NO GROWTH
Culture: NO GROWTH

## 2023-11-24 ENCOUNTER — Ambulatory Visit (INDEPENDENT_AMBULATORY_CARE_PROVIDER_SITE_OTHER): Payer: Self-pay | Admitting: Family Medicine

## 2023-11-24 ENCOUNTER — Encounter (INDEPENDENT_AMBULATORY_CARE_PROVIDER_SITE_OTHER): Payer: Self-pay | Admitting: Family Medicine

## 2023-11-24 VITALS — BP 118/81 | HR 102 | Temp 97.9°F | Ht 66.0 in | Wt 164.0 lb

## 2023-11-24 DIAGNOSIS — Z7985 Long-term (current) use of injectable non-insulin antidiabetic drugs: Secondary | ICD-10-CM | POA: Diagnosis not present

## 2023-11-24 DIAGNOSIS — E669 Obesity, unspecified: Secondary | ICD-10-CM | POA: Diagnosis not present

## 2023-11-24 DIAGNOSIS — K089 Disorder of teeth and supporting structures, unspecified: Secondary | ICD-10-CM

## 2023-11-24 DIAGNOSIS — E119 Type 2 diabetes mellitus without complications: Secondary | ICD-10-CM

## 2023-11-24 DIAGNOSIS — E66811 Obesity, class 1: Secondary | ICD-10-CM

## 2023-11-24 DIAGNOSIS — Z6826 Body mass index (BMI) 26.0-26.9, adult: Secondary | ICD-10-CM

## 2023-11-24 MED ORDER — OZEMPIC (0.25 OR 0.5 MG/DOSE) 2 MG/3ML ~~LOC~~ SOPN: 0.5000 mg | PEN_INJECTOR | SUBCUTANEOUS | 1 refills | Status: DC

## 2023-11-24 NOTE — Progress Notes (Signed)
   SUBJECTIVE:  Chief Complaint: Obesity  Interim History: Patient here for follow up.  Since our last appointment patient has had two separate hospital visits one resulting in an overnight stay.  On the second hospitalization she was diagnosed with pyelonephritis and started on antibiotics which were then changed. She mentions she is still not 100% and feels weak.  Last month she was not able to follow the meal plan as consistently as she did previously.  She is wondering if she is able to still drink protein shakes.  She is waiting on medicaid approval for teeth removal.  No plans for Thanksgiving.   Kristen Moreno is here to discuss her progress with her obesity treatment plan. She is on the practicing portion control and making smarter food choices, such as increasing vegetables and decreasing simple carbohydrates and states she is following her eating plan approximately 50 % of the time. She states she is not exercising.  OBJECTIVE: Visit Diagnoses: Problem List Items Addressed This Visit   None   Vitals Temp: 97.9 F (36.6 C) BP: 118/81 Pulse Rate: (!) 102 SpO2: 98 %   Anthropometric Measurements Height: 5' 6 (1.676 m) Weight: 164 lb (74.4 kg) BMI (Calculated): 26.48 Weight at Last Visit: 166 lb Weight Lost Since Last Visit: 2 Weight Gained Since Last Visit: 0 Starting Weight: 191 lb Total Weight Loss (lbs): 27 lb (12.2 kg)   Body Composition  Body Fat %: 38.9 % Fat Mass (lbs): 64.2 lbs Muscle Mass (lbs): 95.4 lbs Total Body Water  (lbs): 71.4 lbs Visceral Fat Rating : 9   Other Clinical Data Today's Visit #: 18 Starting Date: 09/27/22 Comments: [facility]     ASSESSMENT AND PLAN: Assessment & Plan Type 2 diabetes mellitus without complication, without long-term current use of insulin  (HCC) Patient is currently on GLP-1 pharmacotherapy for further management of her diabetes.  She sees nephrology given her history of kidney transplantation.  Will need repeat fill of  current dosage of Ozempic  today as patient is still not always eating total quantity needed Dental disease Awaiting further intervention for her dental disease as she is trying to find a provider that takes Medicaid.  Will follow-up at next appointment as to plan as this is impacting food intake. Obesity with starting BMI of 31.9  BMI 26.0-26.9,adult    Diet: Kristen Moreno is currently in the action stage of change. As such, her goal is to continue with weight loss efforts and has agreed to practicing portion control and making smarter food choices, such as increasing vegetables and decreasing simple carbohydrates.   Exercise:  All adults should avoid inactivity. Some activity is better than none, and adults who participate in any amount of physical activity, gain some health benefits.  Behavior Modification:  We discussed the following Behavioral Modification Strategies today: increasing lean protein intake, decreasing simple carbohydrates, meal planning and cooking strategies, and planning for success.   No follow-ups on file.   She was informed of the importance of frequent follow up visits to maximize her success with intensive lifestyle modifications for her multiple health conditions.  Attestation Statements:   Reviewed by clinician on day of visit: allergies, medications, problem list, medical history, surgical history, family history, social history, and previous encounter notes.  Kristen Cho, MD

## 2023-11-27 ENCOUNTER — Encounter (HOSPITAL_COMMUNITY): Payer: Self-pay

## 2023-11-30 NOTE — Assessment & Plan Note (Signed)
 Patient is currently on GLP-1 pharmacotherapy for further management of her diabetes.  She sees nephrology given her history of kidney transplantation.  Will need repeat fill of current dosage of Ozempic  today as patient is still not always eating total quantity needed

## 2023-12-12 ENCOUNTER — Other Ambulatory Visit: Payer: Self-pay | Admitting: Student

## 2023-12-25 ENCOUNTER — Encounter: Payer: Self-pay | Admitting: Gastroenterology

## 2023-12-25 ENCOUNTER — Ambulatory Visit: Admitting: Gastroenterology

## 2023-12-25 VITALS — BP 122/66 | HR 68 | Ht 66.0 in | Wt 167.0 lb

## 2023-12-25 DIAGNOSIS — K920 Hematemesis: Secondary | ICD-10-CM

## 2023-12-25 DIAGNOSIS — R1319 Other dysphagia: Secondary | ICD-10-CM

## 2023-12-25 DIAGNOSIS — R1010 Upper abdominal pain, unspecified: Secondary | ICD-10-CM

## 2023-12-25 DIAGNOSIS — K5909 Other constipation: Secondary | ICD-10-CM

## 2023-12-25 MED ORDER — GOLYTELY 236 G PO SOLR: 4000.0000 mL | Freq: Once | ORAL | 0 refills | Status: AC

## 2023-12-25 NOTE — Patient Instructions (Signed)
 You have been scheduled for an endoscopy and colonoscopy. Please follow the written instructions given to you at your visit today.  If you use inhalers (even only as needed), please bring them with you on the day of your procedure.  DO NOT TAKE 7 DAYS PRIOR TO TEST- Trulicity (dulaglutide) Ozempic, Wegovy (semaglutide) Mounjaro, Zepbound (tirzepatide) Bydureon Bcise (exanatide extended release)  DO NOT TAKE 1 DAY PRIOR TO YOUR TEST Rybelsus (semaglutide) Adlyxin (lixisenatide) Victoza (liraglutide) Byetta (exanatide) ___________________________________________________________________________  Thank you for trusting me with your gastrointestinal care!    Dr. Victory Legrand [facility]
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
## 2023-12-28 ENCOUNTER — Encounter (HOSPITAL_COMMUNITY): Payer: Self-pay

## 2023-12-28 ENCOUNTER — Ambulatory Visit: Payer: Self-pay

## 2024-01-04 ENCOUNTER — Ambulatory Visit: Payer: Self-pay | Admitting: Student

## 2024-01-04 ENCOUNTER — Other Ambulatory Visit: Payer: Self-pay

## 2024-01-04 ENCOUNTER — Encounter: Payer: Self-pay | Admitting: Student

## 2024-01-04 VITALS — BP 127/57 | HR 96 | Temp 98.2°F | Ht 66.0 in | Wt 166.2 lb

## 2024-01-04 DIAGNOSIS — Z7985 Long-term (current) use of injectable non-insulin antidiabetic drugs: Secondary | ICD-10-CM

## 2024-01-04 DIAGNOSIS — E119 Type 2 diabetes mellitus without complications: Secondary | ICD-10-CM

## 2024-01-04 DIAGNOSIS — F439 Reaction to severe stress, unspecified: Secondary | ICD-10-CM | POA: Diagnosis not present

## 2024-01-04 DIAGNOSIS — Z79899 Other long term (current) drug therapy: Secondary | ICD-10-CM | POA: Diagnosis not present

## 2024-01-04 DIAGNOSIS — Z634 Disappearance and death of family member: Secondary | ICD-10-CM

## 2024-01-04 DIAGNOSIS — Z87440 Personal history of urinary (tract) infections: Secondary | ICD-10-CM | POA: Diagnosis present

## 2024-01-04 DIAGNOSIS — Z09 Encounter for follow-up examination after completed treatment for conditions other than malignant neoplasm: Secondary | ICD-10-CM | POA: Diagnosis not present

## 2024-01-04 DIAGNOSIS — Z659 Problem related to unspecified psychosocial circumstances: Secondary | ICD-10-CM | POA: Insufficient documentation

## 2024-01-04 DIAGNOSIS — F17210 Nicotine dependence, cigarettes, uncomplicated: Secondary | ICD-10-CM

## 2024-01-04 DIAGNOSIS — Z940 Kidney transplant status: Secondary | ICD-10-CM | POA: Diagnosis not present

## 2024-01-04 DIAGNOSIS — K219 Gastro-esophageal reflux disease without esophagitis: Secondary | ICD-10-CM | POA: Diagnosis not present

## 2024-01-04 MED ORDER — OMEPRAZOLE 20 MG PO CPDR: 20.0000 mg | DELAYED_RELEASE_CAPSULE | Freq: Every day | ORAL | 0 refills | Status: DC

## 2024-01-04 NOTE — Assessment & Plan Note (Signed)
 Good control, recent A1c below 7 on semaglutide  0.5 weekly. Continue.

## 2024-01-04 NOTE — Assessment & Plan Note (Addendum)
 Refill omeprazole  20 daily. She recently established with GI for dysphagia and endoscopy/colonoscopy are planned.

## 2024-01-04 NOTE — Assessment & Plan Note (Signed)
 Here today for hospital follow-up for UTI in setting of kidney transplant and immune suppression. Taking prednisone  5 every day, prograf  7 BID, myfortic  540 BID. She is doing well. No return of symptoms, completed bactrim  7 days per speciation from her hospital stay. No urgent intervention indicated today. Per discharge recommendations, I will collect a CBC and CMP.
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
## 2024-01-04 NOTE — Progress Notes (Signed)
 Internal Medicine Clinic Attending  Case discussed with the resident at the time of the visit.  We reviewed the resident's history and exam and pertinent patient test results.  I agree with the assessment, diagnosis, and plan of care documented in the resident's note.

## 2024-01-05 ENCOUNTER — Ambulatory Visit (INDEPENDENT_AMBULATORY_CARE_PROVIDER_SITE_OTHER): Admitting: Family Medicine

## 2024-01-05 VITALS — BP 138/73 | HR 94 | Temp 98.4°F | Ht 66.0 in | Wt 162.0 lb

## 2024-01-05 DIAGNOSIS — E785 Hyperlipidemia, unspecified: Secondary | ICD-10-CM

## 2024-01-05 DIAGNOSIS — Z7985 Long-term (current) use of injectable non-insulin antidiabetic drugs: Secondary | ICD-10-CM | POA: Diagnosis not present

## 2024-01-05 DIAGNOSIS — E1169 Type 2 diabetes mellitus with other specified complication: Secondary | ICD-10-CM | POA: Diagnosis not present

## 2024-01-05 DIAGNOSIS — E669 Obesity, unspecified: Secondary | ICD-10-CM | POA: Diagnosis not present

## 2024-01-05 DIAGNOSIS — Z6826 Body mass index (BMI) 26.0-26.9, adult: Secondary | ICD-10-CM

## 2024-01-05 DIAGNOSIS — E66811 Obesity, class 1: Secondary | ICD-10-CM

## 2024-01-05 DIAGNOSIS — E119 Type 2 diabetes mellitus without complications: Secondary | ICD-10-CM

## 2024-01-05 LAB — CBC
Hematocrit: 45 % (ref 34.0–46.6)
Hemoglobin: 14.8 g/dL (ref 11.1–15.9)
MCH: 30.6 pg (ref 26.6–33.0)
MCHC: 32.9 g/dL (ref 31.5–35.7)
MCV: 93 fL (ref 79–97)
Platelets: 272 x10E3/uL (ref 150–450)
RBC: 4.83 x10E6/uL (ref 3.77–5.28)
RDW: 13.6 % (ref 11.7–15.4)
WBC: 5.8 x10E3/uL (ref 3.4–10.8)

## 2024-01-05 LAB — COMPREHENSIVE METABOLIC PANEL WITH GFR
ALT: 9 IU/L (ref 0–32)
AST: 13 IU/L (ref 0–40)
Albumin: 3.9 g/dL (ref 3.9–4.9)
Alkaline Phosphatase: 62 IU/L (ref 49–135)
BUN/Creatinine Ratio: 7 — ABNORMAL LOW (ref 12–28)
BUN: 7 mg/dL — ABNORMAL LOW (ref 8–27)
Bilirubin Total: 0.3 mg/dL (ref 0.0–1.2)
CO2: 19 mmol/L — ABNORMAL LOW (ref 20–29)
Calcium: 9.6 mg/dL (ref 8.7–10.3)
Chloride: 105 mmol/L (ref 96–106)
Creatinine, Ser: 0.98 mg/dL (ref 0.57–1.00)
Globulin, Total: 2.8 g/dL (ref 1.5–4.5)
Glucose: 108 mg/dL — ABNORMAL HIGH (ref 70–99)
Potassium: 3.9 mmol/L (ref 3.5–5.2)
Sodium: 143 mmol/L (ref 134–144)
Total Protein: 6.7 g/dL (ref 6.0–8.5)
eGFR: 66 mL/min/1.73 (ref >59)

## 2024-01-05 MED ORDER — OZEMPIC (0.25 OR 0.5 MG/DOSE) 2 MG/3ML ~~LOC~~ SOPN: 0.5000 mg | PEN_INJECTOR | SUBCUTANEOUS | 1 refills | Status: AC

## 2024-01-05 MED ORDER — ATORVASTATIN CALCIUM 40 MG PO TABS: 40.0000 mg | ORAL_TABLET | Freq: Every day | ORAL | 3 refills | Status: AC

## 2024-01-05 NOTE — Progress Notes (Signed)
 "  SUBJECTIVE:  Chief Complaint: Obesity  Interim History: Patient had a good Thanksgiving.  She got laid off last Thursday. She has been working on increasing her protein and has been using unjury protein samples.  She overall feels as though her nutritional intake has increased. Has found that the protein supplement helps control her cravings. Not sure what her plans are on for the upcoming weeks- wants to apply for unemployment.    Kristen Moreno is here to discuss her progress with her obesity treatment plan. She is on the practicing portion control and making smarter food choices, such as increasing vegetables and decreasing simple carbohydrates and states she is following her eating plan approximately 65 % of the time. She states she is not exercising.   OBJECTIVE: Visit Diagnoses: Problem List Items Addressed This Visit       Endocrine   Type 2 diabetes mellitus without complication, without long-term current use of insulin  (HCC) - Primary   Relevant Medications   Semaglutide ,0.25 or 0.5MG /DOS, (OZEMPIC , 0.25 OR 0.5 MG/DOSE,) 2 MG/3ML SOPN   atorvastatin  (LIPITOR) 40 MG tablet   Other Visit Diagnoses       Hyperlipidemia associated with type 2 diabetes mellitus (HCC)       Relevant Medications   Semaglutide ,0.25 or 0.5MG /DOS, (OZEMPIC , 0.25 OR 0.5 MG/DOSE,) 2 MG/3ML SOPN   atorvastatin  (LIPITOR) 40 MG tablet     BMI 26.0-26.9,adult         Obesity with starting BMI of 31.9       Relevant Medications   Semaglutide ,0.25 or 0.5MG /DOS, (OZEMPIC , 0.25 OR 0.5 MG/DOSE,) 2 MG/3ML SOPN       Vitals Temp: 98.4 F (36.9 C) BP: 138/73 Pulse Rate: 94 SpO2: 100 %   Anthropometric Measurements Height: 5' 6 (1.676 m) Weight: 162 lb (73.5 kg) BMI (Calculated): 26.16 Weight at Last Visit: 164 lb Weight Lost Since Last Visit: 2 Weight Gained Since Last Visit: 0 Starting Weight: 191 lb Total Weight Loss (lbs): 29 lb (13.2 kg)   Body Composition  Body Fat %: 38.9 % Fat Mass  (lbs): 63.4 lbs Muscle Mass (lbs): 94.4 lbs Total Body Water  (lbs): 67.2 lbs Visceral Fat Rating : 9   Other Clinical Data Today's Visit #: 19 Starting Date: 09/27/22 Comments: PC/[place]     ASSESSMENT AND PLAN: Assessment & Plan Type 2 diabetes mellitus without complication, without long-term current use of insulin  (HCC) Tolerating Ozempic  at current dosage.  Needs refill today.  Patient is still inconsistent with her intake given recent illnesses as well as financial constraints.  Will follow-up at next appointment to see if patient has any side effects on this dosage. Hyperlipidemia associated with type 2 diabetes mellitus (HCC) Tolerating Lipitor and needs refill today.  No myalgias mentioned.  Patient has not had any transaminases elevated on previous labs.  Labs done yesterday reviewed with patient and LFTs were within normal limits.  Lipitor sent into pharmacy. BMI 26.0-26.9,adult  Obesity with starting BMI of 31.9    Diet: Kristen Moreno is currently in the action stage of change. As such, her goal is to continue with weight loss efforts and has agreed to practicing portion control and making smarter food choices, such as increasing vegetables and decreasing simple carbohydrates.   Exercise:  For substantial health benefits, adults should do at least 150 minutes (2 hours and 30 minutes) a week of moderate-intensity, or 75 minutes (1 hour and 15 minutes) a week of vigorous-intensity aerobic physical activity, or an equivalent combination of moderate- and  vigorous-intensity aerobic activity. Aerobic activity should be performed in episodes of at least 10 minutes, and preferably, it should be spread throughout the week. Patient to look for free videos to incorporate activity while she is at home as she does not like being outside during the winter.  Behavior Modification:  We discussed the following Behavioral Modification Strategies today: increasing lean protein intake, decreasing  simple carbohydrates, increasing vegetables, meal planning and cooking strategies, and planning for success.   Return in about 6 weeks (around 02/16/2024).   She was informed of the importance of frequent follow up visits to maximize her success with intensive lifestyle modifications for her multiple health conditions.  Attestation Statements:   Reviewed by clinician on day of visit: allergies, medications, problem list, medical history, surgical history, family history, social history, and previous encounter notes.    Kristen Cho, MD "

## 2024-01-13 ENCOUNTER — Other Ambulatory Visit: Payer: Self-pay | Admitting: Student

## 2024-01-13 DIAGNOSIS — K219 Gastro-esophageal reflux disease without esophagitis: Secondary | ICD-10-CM

## 2024-01-14 ENCOUNTER — Encounter (HOSPITAL_COMMUNITY): Payer: Self-pay

## 2024-01-18 NOTE — Assessment & Plan Note (Signed)
 Tolerating Ozempic  at current dosage.  Needs refill today.  Patient is still inconsistent with her intake given recent illnesses as well as financial constraints.  Will follow-up at next appointment to see if patient has any side effects on this dosage.

## 2024-01-22 ENCOUNTER — Encounter (HOSPITAL_COMMUNITY): Payer: Self-pay

## 2024-02-01 ENCOUNTER — Emergency Department (HOSPITAL_BASED_OUTPATIENT_CLINIC_OR_DEPARTMENT_OTHER)
Admission: EM | Admit: 2024-02-01 | Discharge: 2024-02-02 | Disposition: A | Attending: Emergency Medicine | Admitting: Emergency Medicine

## 2024-02-01 ENCOUNTER — Other Ambulatory Visit: Payer: Self-pay

## 2024-02-01 DIAGNOSIS — M5431 Sciatica, right side: Secondary | ICD-10-CM

## 2024-02-01 DIAGNOSIS — M5441 Lumbago with sciatica, right side: Secondary | ICD-10-CM | POA: Diagnosis not present

## 2024-02-01 DIAGNOSIS — Z940 Kidney transplant status: Secondary | ICD-10-CM | POA: Diagnosis not present

## 2024-02-01 DIAGNOSIS — M545 Low back pain, unspecified: Secondary | ICD-10-CM | POA: Diagnosis present

## 2024-02-01 NOTE — ED Triage Notes (Addendum)
 Reports leg pain from lower leg to upper thigh. Only felt with use. Active at work on feet. Denies swelling, discoloration. Pain shoots up into right buttock.   Hx kidney transplant 2021

## 2024-02-02 MED ORDER — PREDNISONE 10 MG (21) PO TBPK: ORAL_TABLET | ORAL | 0 refills | Status: AC

## 2024-02-02 MED ORDER — PREDNISONE 50 MG PO TABS
60.0000 mg | ORAL_TABLET | Freq: Once | ORAL | Status: AC
  Administered 2024-02-02: 60 mg via ORAL
  Filled 2024-02-02: qty 1

## 2024-02-02 MED ORDER — MORPHINE SULFATE (PF) 4 MG/ML IV SOLN
4.0000 mg | Freq: Once | INTRAVENOUS | Status: AC
  Administered 2024-02-02: 4 mg via INTRAMUSCULAR
  Filled 2024-02-02: qty 1

## 2024-02-02 NOTE — ED Provider Notes (Signed)
 "  [place] EMERGENCY DEPARTMENT AT [facility]  Provider Note  CSN: [id number] Arrival date & time: 02/01/24 2159  History Chief Complaint  Patient presents with   Leg Pain    right    Kristen Moreno is a 64 y.o. female with history of renal transplant (on rejection meds including prednisone ), chronic back pain s/p fusion on oxycodone  reports R leg pain for the last several days, started while at work. She was stepping backwards but did not fall. She reports pain is worse with movement and lifting. She reports pain shoots like lightning from her R lower back, down her buttock to her R leg. No incontinence, no fever.    Home Medications Prior to Admission medications  Medication Sig Start Date End Date Taking? Authorizing Provider  predniSONE  (STERAPRED UNI-PAK 21 TAB) 10 MG (21) TBPK tablet 10mg  Tabs, 6 day taper. Use as directed 02/02/24  Yes Roselyn Carlin NOVAK, MD  acetaminophen  (TYLENOL ) 500 MG tablet Take 500 mg by mouth every 8 (eight) hours as needed (for pain or headaches).    [provider]  albuterol  (PROVENTIL  HFA) 108 (90 Base) MCG/ACT inhaler Inhale 1-2 puffs into the lungs every 6 (six) hours as needed for wheezing or shortness of breath. 01/03/22   Teresa Carrier, MD  amLODipine  (NORVASC ) 10 MG tablet TAKE 1 TABLET BY MOUTH EVERYDAY AT BEDTIME Patient taking differently: Take 10 mg by mouth at bedtime. 09/24/23   Harrie Bruckner, DO  atorvastatin  (LIPITOR) 40 MG tablet Take 1 tablet (40 mg total) by mouth daily. 01/05/24   [place]keley Adelita PENNER, MD  Biotin w/ Vitamins C & E (HAIR SKIN & NAILS GUMMIES PO) Take 1 tablet by mouth daily.    [provider]  Blood Glucose Monitoring Suppl (ACCU-CHEK GUIDE ME) w/Device KIT MISCELLANEOUS 01/23/21   [provider]  cyclobenzaprine  (FLEXERIL ) 10 MG tablet Take 10 mg by mouth at bedtime as needed for muscle spasms.    [provider]  Insulin  Pen Needle (BD PEN NEEDLE NANO 2ND GEN)  32G X 4 MM MISC 1 Package by Does not apply route 2 (two) times daily. 04/10/22   Berkeley Adelita PENNER, MD  Magnesium  250 MG TABS Take 250 mg by mouth 2 (two) times daily.    [provider]  mycophenolate  (MYFORTIC ) 180 MG EC tablet Take 3 tablets (540 mg total) by mouth 2 (two) times daily. 04/15/21   Dawley, Troy C, DO  Mycophenolate  Sodium (MYCOPHENOLIC ACID ) 180 MG TBEC Take 540 mg by mouth See admin instructions. Take 540 mg by mouth in the morning and evening    [provider]  naloxone (NARCAN) nasal spray 4 mg/0.1 mL Place 1 spray into the nose once as needed (for a crisis). 09/23/22   [provider]  omeprazole  (PRILOSEC) 20 MG capsule Take 1 capsule by mouth once daily 01/13/24   Juberg, Christopher, DO  ondansetron  (ZOFRAN ) 4 MG tablet Take 1 tablet (4 mg total) by mouth every 8 (eight) hours as needed for nausea or vomiting. 07/10/22   Jinwala, Sagar H, MD  oxyCODONE -acetaminophen  (PERCOCET) 10-325 MG tablet Take 1 tablet by mouth 3 (three) times daily.    [provider]  polyethylene glycol (MIRALAX  / GLYCOLAX ) 17 g packet Take 17 g by mouth daily as needed for mild constipation. 11/19/23   Sherrill Cable Latif, DO  predniSONE  (DELTASONE ) 5 MG tablet Take 5 mg by mouth daily with breakfast.    [provider]  promethazine  (PHENERGAN ) 25  MG tablet Take 1 tablet (25 mg total) by mouth every 8 (eight) hours as needed for nausea or vomiting. 10/17/23   Iola Lukes, FNP  protein supplement (UNJURY CHICKEN SOUP) POWD Take 7 g (2 oz total) by mouth 4 (four) times daily. 10/13/23   Berkeley Adelita PENNER, MD  Semaglutide ,0.25 or 0.5MG /DOS, (OZEMPIC , 0.25 OR 0.5 MG/DOSE,) 2 MG/3ML SOPN Inject 0.5 mg into the skin once a week. 01/05/24   Berkeley Adelita PENNER, MD  sertraline  (ZOLOFT ) 25 MG tablet Take 1 tablet (25 mg total) by mouth daily. Patient taking differently: Take 25 mg by mouth daily as needed (for anxiety). 09/16/23 01/05/24  Berkeley Adelita PENNER, MD  sodium bicarbonate  650 MG tablet Take 650 mg by mouth 3 (three) times daily.    [provider]  tacrolimus  (PROGRAF ) 1 MG capsule Take 7 mg by mouth See admin instructions. Take 7 mg by mouth in the morning and evening    [provider]  Tiotropium Bromide  Monohydrate 2.5 MCG/ACT AERS Inhale 2 puffs into the lungs daily. Patient taking differently: Inhale 2 puffs into the lungs daily as needed (for respiratory flares). 01/03/22   Teresa Carrier, MD     Allergies    Ace inhibitors, Lisinopril, and Tomato   Review of Systems   Review of Systems Please see HPI for pertinent positives and negatives  Physical Exam BP 137/74 (BP Location: Right Arm)   Pulse (!) 102   Temp 98 F (36.7 C)   Resp 17   SpO2 97%   Physical Exam Vitals and nursing note reviewed.  Constitutional:      Appearance: Normal appearance.  HENT:     Head: Normocephalic and atraumatic.     Nose: Nose normal.     Mouth/Throat:     Mouth: Mucous membranes are moist.  Eyes:     Extraocular Movements: Extraocular movements intact.     Conjunctiva/sclera: Conjunctivae normal.  Cardiovascular:     Rate and Rhythm: Normal rate.  Pulmonary:     Effort: Pulmonary effort is normal.     Breath sounds: Normal breath sounds.  Abdominal:     General: Abdomen is flat.     Palpations: Abdomen is soft.     Tenderness: There is no abdominal tenderness.  Musculoskeletal:        General: Tenderness (R lumbar paraspinal muscles and sciatic notch) present. No swelling. Normal range of motion.     Cervical back: Neck supple.  Skin:    General: Skin is warm and dry.  Neurological:     General: No focal deficit present.     Mental Status: She is alert and oriented to person, place, and time.     Sensory: No sensory deficit.     Motor: No weakness.  Psychiatric:        Mood and Affect: Mood normal.     ED Results / Procedures / Treatments   EKG None  Procedures Procedures  Medications  Ordered in the ED Medications  morphine  (PF) 4 MG/ML injection 4 mg (has no administration in time range)  predniSONE  (DELTASONE ) tablet 60 mg (has no administration in time range)    Initial Impression and Plan  Patient here with R leg pain consistent with sciatica, history of same, no Red Flags today. Given her prior kidney transplant would avoid NSAIDs, she is already on chronic oxycodone . Will increase steroids for the next few days then back to usual dose. Recommend rest, heat and Spine follow up. RTED for  any other concerns.   ED Course       MDM Rules/Calculators/A&P Medical Decision Making Problems Addressed: Sciatica of right side: chronic illness or injury with exacerbation, progression, or side effects of treatment  Risk Prescription drug management.     Final Clinical Impression(s) / ED Diagnoses Final diagnoses:  Sciatica of right side    Rx / DC Orders ED Discharge Orders          Ordered    predniSONE  (STERAPRED UNI-PAK 21 TAB) 10 MG (21) TBPK tablet        02/02/24 0012             Roselyn Carlin NOVAK, MD 02/02/24 0013  "

## 2024-02-09 ENCOUNTER — Other Ambulatory Visit: Payer: Self-pay

## 2024-02-09 ENCOUNTER — Encounter (HOSPITAL_COMMUNITY): Payer: Self-pay

## 2024-02-09 DIAGNOSIS — K219 Gastro-esophageal reflux disease without esophagitis: Secondary | ICD-10-CM

## 2024-02-09 MED ORDER — OMEPRAZOLE 20 MG PO CPDR: 20.0000 mg | DELAYED_RELEASE_CAPSULE | Freq: Every day | ORAL | 3 refills | Status: AC

## 2024-02-09 NOTE — Telephone Encounter (Signed)
 Medication sent to pharmacy

## 2024-02-11 ENCOUNTER — Encounter (HOSPITAL_COMMUNITY): Payer: Self-pay

## 2024-02-16 ENCOUNTER — Other Ambulatory Visit: Payer: Self-pay

## 2024-02-16 DIAGNOSIS — M5416 Radiculopathy, lumbar region: Secondary | ICD-10-CM

## 2024-02-17 ENCOUNTER — Other Ambulatory Visit: Payer: Self-pay

## 2024-02-17 ENCOUNTER — Encounter (HOSPITAL_COMMUNITY): Payer: Self-pay

## 2024-02-18 ENCOUNTER — Encounter (HOSPITAL_COMMUNITY): Payer: Self-pay

## 2024-02-19 ENCOUNTER — Ambulatory Visit: Admitting: Gastroenterology

## 2024-02-19 ENCOUNTER — Telehealth: Payer: Self-pay | Admitting: *Deleted

## 2024-02-19 ENCOUNTER — Other Ambulatory Visit: Payer: Self-pay

## 2024-02-19 DIAGNOSIS — K5909 Other constipation: Secondary | ICD-10-CM

## 2024-02-19 MED ORDER — PEG 3350-KCL-NA BICARB-NACL 420 G PO SOLR: 4000.0000 mL | Freq: Once | ORAL | 0 refills | Status: AC

## 2024-02-19 NOTE — Telephone Encounter (Signed)
 Pt arrived for procedure and had just drank 10 oz of water  after checking in.  Per [facility] policy pt will need to be rescheduled.  Rescheduled pt for Friday 03/25/24 at 1:00 pm.  New instructions printed and reviewed with patient.  Prep sent to pharmacy.

## 2024-02-24 ENCOUNTER — Encounter (INDEPENDENT_AMBULATORY_CARE_PROVIDER_SITE_OTHER): Payer: Self-pay | Admitting: Family Medicine

## 2024-02-24 ENCOUNTER — Ambulatory Visit (INDEPENDENT_AMBULATORY_CARE_PROVIDER_SITE_OTHER): Admitting: Family Medicine

## 2024-02-24 VITALS — BP 125/73 | HR 105 | Temp 98.1°F | Ht 66.0 in | Wt 160.0 lb

## 2024-02-24 DIAGNOSIS — Z6825 Body mass index (BMI) 25.0-25.9, adult: Secondary | ICD-10-CM

## 2024-02-24 DIAGNOSIS — R110 Nausea: Secondary | ICD-10-CM

## 2024-02-24 DIAGNOSIS — Z638 Other specified problems related to primary support group: Secondary | ICD-10-CM

## 2024-02-24 DIAGNOSIS — E119 Type 2 diabetes mellitus without complications: Secondary | ICD-10-CM

## 2024-02-24 DIAGNOSIS — Z6831 Body mass index (BMI) 31.0-31.9, adult: Secondary | ICD-10-CM

## 2024-02-24 MED ORDER — ONDANSETRON HCL 4 MG PO TABS: 4.0000 mg | ORAL_TABLET | Freq: Three times a day (TID) | ORAL | 0 refills | Status: AC | PRN

## 2024-02-24 MED ORDER — SERTRALINE HCL 25 MG PO TABS: 25.0000 mg | ORAL_TABLET | Freq: Every day | ORAL | 0 refills | Status: AC

## 2024-02-24 NOTE — Progress Notes (Unsigned)
 "  SUBJECTIVE:  Chief Complaint: Obesity  Interim History: Patient maintained a sense of normalcy thru the holidays due to being laid off.  She got called back to work 2 weeks after work.  She and her daughter got into an argument recently.  She is waiting for her daughter to be move out.  She is starting physical therapy for a pinched nerve in her leg.  Her leg is in constant pain. She has a colonoscopy rescheduled for March.  She has been eating mostly protein and vegetables and occasionally fruit.  She is wanting to move due to some health concerns about the place she is living.  Kristen Moreno is here to discuss her progress with her obesity treatment plan. She is on the practicing portion control and making smarter food choices, such as increasing vegetables and decreasing simple carbohydrates and states she is following her eating plan approximately 50 % of the time. She states she is not exercising.   OBJECTIVE: Visit Diagnoses: Problem List Items Addressed This Visit       Endocrine   Type 2 diabetes mellitus without complication, without long-term current use of insulin  (HCC) - Primary     Other   Stress due to family tension   Relevant Medications   sertraline  (ZOLOFT ) 25 MG tablet   Other Visit Diagnoses       Nausea       Relevant Medications   ondansetron  (ZOFRAN ) 4 MG tablet     BMI 25.0-25.9,adult         Obesity with starting BMI of 31.9           Vitals Temp: 98.1 F (36.7 C) BP: 125/73 Pulse Rate: (!) 105 SpO2: 100 %   Anthropometric Measurements Height: 5' 6 (1.676 m) Weight: 160 lb (72.6 kg) BMI (Calculated): 25.84 Weight at Last Visit: 162 lb Weight Lost Since Last Visit: 2 Weight Gained Since Last Visit: 0 Starting Weight: 191 lb Total Weight Loss (lbs): 31 lb (14.1 kg)   Body Composition  Body Fat %: 38.2 % Fat Mass (lbs): 61.2 lbs Muscle Mass (lbs): 93.8 lbs Total Body Water  (lbs): 68 lbs Visceral Fat Rating : 9   Other Clinical  Data Today's Visit #: 20 Starting Date: 09/27/22 Comments: Pc/[place]     ASSESSMENT AND PLAN: Assessment & Plan Stress due to family tension  Nausea  Type 2 diabetes mellitus without complication, without long-term current use of insulin  (HCC) Occasional nausea that patient had before being on Ozempic .  She mentions that she just picked up her prescription for Ozempic  yesterday.  No feelings for hypoglycemia.  Will continue on current dose of Ozempic  with no change. BMI 25.0-25.9,adult  Obesity with starting BMI of 31.9    Diet: Kristen Moreno is currently in the action stage of change. As such, her goal is to continue with weight loss efforts and has agreed to practicing portion control and making smarter food choices, such as increasing vegetables and decreasing simple carbohydrates.   Exercise:  Start physical therapy and follow their instructions.  Behavior Modification:  We discussed the following Behavioral Modification Strategies today: increasing lean protein intake, decreasing simple carbohydrates, increasing vegetables, meal planning and cooking strategies, and keeping healthy foods in the home.   Return in about 8 weeks (around 04/20/2024).   She was informed of the importance of frequent follow up visits to maximize her success with intensive lifestyle modifications for her multiple health conditions.  Attestation Statements:   Reviewed by clinician on day of  visit: allergies, medications, problem list, medical history, surgical history, family history, social history, and previous encounter notes.    Kristen Cho, MD "

## 2024-02-24 NOTE — Assessment & Plan Note (Signed)
 Occasional nausea that patient had before being on Ozempic .  She mentions that she just picked up her prescription for Ozempic  yesterday.  No feelings for hypoglycemia.  Will continue on current dose of Ozempic  with no change.

## 2024-03-04 ENCOUNTER — Other Ambulatory Visit

## 2024-03-04 ENCOUNTER — Ambulatory Visit

## 2024-03-25 ENCOUNTER — Encounter: Admitting: Gastroenterology

## 2024-04-20 ENCOUNTER — Ambulatory Visit (INDEPENDENT_AMBULATORY_CARE_PROVIDER_SITE_OTHER): Admitting: Family Medicine
